# Patient Record
Sex: Male | Born: 1935 | Race: White | Hispanic: No | Marital: Married | Smoking: Former smoker
Health system: Southern US, Community
[De-identification: ages and names within clinical notes are randomized; demographics above are authoritative.]

## PROBLEM LIST (undated history)

## (undated) DIAGNOSIS — C679 Malignant neoplasm of bladder, unspecified: Secondary | ICD-10-CM

## (undated) DIAGNOSIS — M549 Dorsalgia, unspecified: Secondary | ICD-10-CM

## (undated) DIAGNOSIS — IMO0001 Reserved for inherently not codable concepts without codable children: Secondary | ICD-10-CM

## (undated) DIAGNOSIS — G8929 Other chronic pain: Secondary | ICD-10-CM

## (undated) DIAGNOSIS — R351 Nocturia: Secondary | ICD-10-CM

## (undated) DIAGNOSIS — J439 Emphysema, unspecified: Secondary | ICD-10-CM

## (undated) DIAGNOSIS — C9110 Chronic lymphocytic leukemia of B-cell type not having achieved remission: Secondary | ICD-10-CM

## (undated) DIAGNOSIS — L299 Pruritus, unspecified: Secondary | ICD-10-CM

## (undated) DIAGNOSIS — I2510 Atherosclerotic heart disease of native coronary artery without angina pectoris: Secondary | ICD-10-CM

## (undated) DIAGNOSIS — J45909 Unspecified asthma, uncomplicated: Secondary | ICD-10-CM

## (undated) DIAGNOSIS — Z8673 Personal history of transient ischemic attack (TIA), and cerebral infarction without residual deficits: Secondary | ICD-10-CM

## (undated) DIAGNOSIS — Z888 Allergy status to other drugs, medicaments and biological substances status: Secondary | ICD-10-CM

## (undated) DIAGNOSIS — I10 Essential (primary) hypertension: Secondary | ICD-10-CM

## (undated) DIAGNOSIS — R0600 Dyspnea, unspecified: Secondary | ICD-10-CM

## (undated) DIAGNOSIS — M1990 Unspecified osteoarthritis, unspecified site: Secondary | ICD-10-CM

## (undated) DIAGNOSIS — M8580 Other specified disorders of bone density and structure, unspecified site: Secondary | ICD-10-CM

## (undated) DIAGNOSIS — G454 Transient global amnesia: Secondary | ICD-10-CM

## (undated) DIAGNOSIS — N400 Enlarged prostate without lower urinary tract symptoms: Secondary | ICD-10-CM

## (undated) DIAGNOSIS — E785 Hyperlipidemia, unspecified: Secondary | ICD-10-CM

## (undated) DIAGNOSIS — R0609 Other forms of dyspnea: Secondary | ICD-10-CM

## (undated) HISTORY — PX: CATARACT EXTRACTION W/ INTRAOCULAR LENS  IMPLANT, BILATERAL: SHX1307

## (undated) HISTORY — DX: Hyperlipidemia, unspecified: E78.5

## (undated) HISTORY — DX: Unspecified osteoarthritis, unspecified site: M19.90

## (undated) HISTORY — DX: Essential (primary) hypertension: I10

## (undated) HISTORY — DX: Transient global amnesia: G45.4

## (undated) HISTORY — DX: Chronic lymphocytic leukemia of B-cell type not having achieved remission: C91.10

## (undated) HISTORY — DX: Other specified disorders of bone density and structure, unspecified site: M85.80

## (undated) HISTORY — DX: Malignant neoplasm of bladder, unspecified: C67.9

## (undated) HISTORY — DX: Benign prostatic hyperplasia without lower urinary tract symptoms: N40.0

---

## 1978-03-06 HISTORY — PX: EXCISIONAL HEMORRHOIDECTOMY: SHX1541

## 1978-11-05 DIAGNOSIS — Z8673 Personal history of transient ischemic attack (TIA), and cerebral infarction without residual deficits: Secondary | ICD-10-CM

## 1978-11-05 HISTORY — DX: Personal history of transient ischemic attack (TIA), and cerebral infarction without residual deficits: Z86.73

## 2000-07-06 ENCOUNTER — Encounter: Payer: Self-pay | Admitting: Family Medicine

## 2000-07-06 ENCOUNTER — Ambulatory Visit (HOSPITAL_COMMUNITY): Admission: RE | Admit: 2000-07-06 | Discharge: 2000-07-06 | Payer: Self-pay | Admitting: Family Medicine

## 2002-08-12 ENCOUNTER — Ambulatory Visit (HOSPITAL_COMMUNITY): Admission: RE | Admit: 2002-08-12 | Discharge: 2002-08-12 | Payer: Self-pay | Admitting: Gastroenterology

## 2002-08-12 ENCOUNTER — Encounter (INDEPENDENT_AMBULATORY_CARE_PROVIDER_SITE_OTHER): Payer: Self-pay | Admitting: Specialist

## 2004-03-06 DIAGNOSIS — M8580 Other specified disorders of bone density and structure, unspecified site: Secondary | ICD-10-CM

## 2004-03-06 HISTORY — DX: Other specified disorders of bone density and structure, unspecified site: M85.80

## 2005-07-04 HISTORY — PX: CARDIOVASCULAR STRESS TEST: SHX262

## 2008-10-19 ENCOUNTER — Ambulatory Visit (HOSPITAL_BASED_OUTPATIENT_CLINIC_OR_DEPARTMENT_OTHER): Admission: RE | Admit: 2008-10-19 | Discharge: 2008-10-19 | Payer: Self-pay | Admitting: Urology

## 2008-10-19 ENCOUNTER — Encounter (INDEPENDENT_AMBULATORY_CARE_PROVIDER_SITE_OTHER): Payer: Self-pay | Admitting: Urology

## 2008-10-19 HISTORY — PX: TRANSURETHRAL RESECTION OF BLADDER TUMOR: SHX2575

## 2010-06-11 LAB — POCT I-STAT, CHEM 8
BUN: 13 mg/dL (ref 6–23)
Calcium, Ion: 1.21 mmol/L (ref 1.12–1.32)
Chloride: 101 mEq/L (ref 96–112)
Creatinine, Ser: 1.1 mg/dL (ref 0.4–1.5)
Glucose, Bld: 104 mg/dL — ABNORMAL HIGH (ref 70–99)
HCT: 42 % (ref 39.0–52.0)
Hemoglobin: 14.3 g/dL (ref 13.0–17.0)
Potassium: 4.1 mEq/L (ref 3.5–5.1)
Sodium: 140 mEq/L (ref 135–145)
TCO2: 28 mmol/L (ref 0–100)

## 2010-07-19 NOTE — Op Note (Signed)
NAME:  Johnathan Strong, Johnathan Strong NO.:  [id number]   MEDICAL RECORD NO.:  [id number]          PATIENT TYPE:  AMB   LOCATION:  [facility]                         FACILITY:  [facility]   PHYSICIAN:  Mark C. Vernie Ammons, M.D.  DATE OF BIRTH:  [birth year]   DATE OF PROCEDURE:  10/19/2008  DATE OF DISCHARGE:                               OPERATIVE REPORT   PREOPERATIVE DIAGNOSES:  1. Bladder tumor.  2. Urethral stricture.   POSTOPERATIVE DIAGNOSES:  1. Bladder tumor.  2. Urethral stricture.   PROCEDURES:  1. Transurethral resection of bladder tumor (1.5 cm).  2. Dilation of urethral stricture.   SURGEON:  Mark C. Vernie Ammons, M.D.   ANESTHESIA:  General.   SPECIMENS:  Bladder tumor to pathology.   BLOOD LOSS:  Minimal.   DRAINS:  18-French Foley catheter.   COMPLICATIONS:  None.   INDICATIONS:  The patient is an 77 year old male who was seen for  hematuria.  Upper tract evaluation was negative.  However,  cystoscopically I found a bladder tumor on the right wall of his  bladder.  It appeared papillary in configuration.  I discussed with the  patient the need for its resectional biopsy and treatment and went over  the procedure in detail as well as the risks and complications and  alternatives.  The patient understands and has elected to proceed with  surgery.   DESCRIPTION OF OPERATION:  After informed consent, the patient was  brought to the major OR, placed on the table, administered general  anesthesia, then moved to the dorsal lithotomy position.  His genitalia  was sterilely prepped and draped and an official time-out was then  performed.   Initially, I passed the 22-French cystoscope and noted a slight  narrowing just inside the fossa navicularis and then also in an area in  the distal penile urethra.  Beyond that the urethra appeared entirely  normal.  The prostatic urethra revealed bilobar hypertrophy but no  lesions were identified.  Upon entering the bladder, I note  1+  trabeculation.  Ureteral orifices were noted to be of normal  configuration and position.  The bladder was then fully and  systematically inspected and the tumor was identified on the right wall.  It was near the right ureteral orifice but still well away from that  primarily lateral to the orifice.  There was some mild area of erythema  surrounding the papillary lesion.  It was photographed.   The cystoscope was removed and I then dilated the urethra with Sissy Hoff  sounds starting at 22-French and progressing to 30-French.  As I did so  I noted the area of the mildly strictured region did yield to gentle  pressure.   The 26-French resectoscope sheath with Timberlake obturator was then  introduced and passed into the bladder.  The obturator was removed and  the 12-degree lens with resectoscope element was inserted.  The patient  was paralyzed briefly using intravenous paralytic agents due to the  location of the tumor on the right wall of the bladder and then I  resected the lesion completely from the bladder.  The portions of the  tumor were sent to pathology.  I then fulgurated the base where the  lesion was located as well as circumferentially around the area resected  with care being taken to remain well away from the right ureteral  orifice.  No further bleeding was noted.  I, therefore, used the Ellik  evacuator to remove all any further portions of the bladder tumor from  the bladder and the bladder was drained and the resectoscope was  removed.  I then inserted an 18-French Foley catheter in the bladder and  this was connected to closed system drainage.  The patient was awakened  and taken to recovery room in stable and satisfactory condition.  He  tolerated the procedure well with no intraoperative complications.   I have given him a prescription for Vicodin #28, Cipro 250 mg b.i.d.  #20, and Pyridium 200 mg #30.  He will follow up in my office in 1 week  and will be  instructed on Foley catheter removal in 48 hours.      Mark C. Vernie Ammons, M.D.  Electronically Signed     MCO/MEDQ  D:  10/19/2008  T:  10/19/2008  Job:  [id number]

## 2010-07-22 NOTE — Op Note (Signed)
NAME:  Johnathan Strong, Johnathan Strong NO.:  [id number]   MEDICAL RECORD NO.:  [id number]                   PATIENT TYPE:  AMB   LOCATION:  ENDO                                 FACILITY:  [facility]   PHYSICIAN:  Petra Kuba, M.D.                 DATE OF BIRTH:  02-18-1924   DATE OF PROCEDURE:  08/12/2002  DATE OF DISCHARGE:                                 OPERATIVE REPORT   PROCEDURE:  Colonoscopy with polypectomy.   INDICATIONS FOR PROCEDURE:  Screening.  Consent was signed after risks,  benefits, methods, and options were thoroughly discussed in the office.   MEDICINES USED:  Demerol 90, Versed 8.5.   DESCRIPTION OF PROCEDURE:  Rectal inspection was pertinent for external  hemorrhoids, small. Digital exam was negative. The pediatric video  adjustable colonoscope was inserted and fairly easily advanced around the  colon to the cecum. This did require rolling him on his back and some  abdominal pressure. With insertion, some left sided diverticula was seen but  no other abnormalities. The cecum was identified by the appendiceal orifice  and the ileocecal valve. The scope was slowly withdrawn. The prep was  adequate. There was some liquid stool that required washing and suctioning.  On slow withdrawal through the colon in the mid ascending, a small polyp was  seen and was hot biopsied x2.  Also in the hepatic flexure another tiny  polyp was seen and was hot biopsied x1, both were put in the same container.  The scope was further withdrawn. Other than some left sided diverticula, no  other abnormalities were seen as we slowly withdrew back to the rectum. Once  back in the rectum, three tiny hyperplastic appearing polyps were seen and  were all cold biopsied and put in the second container. The scope was  retroflexed revealing some internal hemorrhoids. The scope was straightened  and readvanced a short ways up the left side of the colon, air was  suctioned, scope  removed. The patient tolerated the procedure well. There  was no obvious or immediate complications.   ENDOSCOPIC DIAGNOSIS:  1. Internal and external hemorrhoids.  2. Three hyperplastic appearing rectal polyps cold biopsied.  3. Two tiny to small hepatic flexure and ascending polyps hot biopsied.  4. Left sided diverticula.  5. Otherwise within normal limits to the cecum.   PLAN:  Await pathology to determine future colonic screening if doing well  medically, otherwise, okay to restart Persantine in three days. Happy to see  back p.r.n., return care to above mentioned doctor for the customary health  care maintenance to include year rectals and guaiacs.                                               Vernia Buff  E. Magod, M.D.    MEM/MEDQ  D:  08/12/2002  T:  08/12/2002  Job:  [id number]   cc:   Ernestina Penna, M.D.  [address] [place]  [place] [address]  Fax: [phone]   Cassell Clement, M.D.  [address]  [place]  [place] [address]  Fax: [phone]   Elta Guadeloupe, M.D.  Oncology, [facility]

## 2010-07-25 ENCOUNTER — Institutional Professional Consult (permissible substitution): Payer: Self-pay | Admitting: Internal Medicine

## 2010-08-03 ENCOUNTER — Other Ambulatory Visit: Payer: Self-pay | Admitting: Cardiology

## 2010-08-03 MED ORDER — HYDROCHLOROTHIAZIDE 12.5 MG PO CAPS: 12.5000 mg | ORAL_CAPSULE | Freq: Every day | ORAL | Status: DC

## 2010-08-03 NOTE — Telephone Encounter (Signed)
Med refill

## 2010-08-29 ENCOUNTER — Encounter: Payer: Self-pay | Admitting: Internal Medicine

## 2010-08-30 ENCOUNTER — Encounter: Payer: Self-pay | Admitting: Internal Medicine

## 2010-08-30 ENCOUNTER — Ambulatory Visit (INDEPENDENT_AMBULATORY_CARE_PROVIDER_SITE_OTHER): Payer: Medicare Other | Admitting: Internal Medicine

## 2010-08-30 VITALS — BP 130/80 | HR 57 | Ht 68.0 in | Wt 171.0 lb

## 2010-08-30 DIAGNOSIS — J449 Chronic obstructive pulmonary disease, unspecified: Secondary | ICD-10-CM | POA: Insufficient documentation

## 2010-08-30 MED ORDER — BUDESONIDE-FORMOTEROL FUMARATE 160-4.5 MCG/ACT IN AERO: INHALATION_SPRAY | RESPIRATORY_TRACT | Status: DC

## 2010-08-30 NOTE — Patient Instructions (Signed)
Stop advair  Start symbicort 160  Take 2 puffs first thing in am and then another 2 puffs about 12 hours later - this should improve your breathing and reduce your need for albuterol.  If not, we'll need to see you in [facility] for cxr and full pft's - call [phone] to arrange this    Work on perfecting inhaler technique:  relax and gently blow all the way out then take a nice smooth deep breath back in, triggering the inhaler at same time you start breathing in.  Hold for up to 5 seconds if you can.  Rinse and gargle with water when done   If your mouth or throat starts to bother you,   I suggest you time the inhaler to your dental care and after using the inhaler(s) brush teeth and tongue with a baking soda containing toothpaste and when you rinse this out, gargle with it first to see if this helps your mouth and throat.      If you are satisfied with your treatment plan let your doctor know and he/she can either refill your medications or you can return here when your prescription runs out.     If in any way you are not 100% satisfied,  please tell us.  If 100% better, tell your friends!

## 2010-08-30 NOTE — Assessment & Plan Note (Signed)
  When respiratory symptoms begin well after a patient reports complete smoking cessation,  it is very hard to "blame" COPD specifically or airways disorders in general  ie it doesn't make any more sense than hearing a  [employer] driver wrecked his car while driving his kids to school or a surgeon sliced his hand off carving roast beef (it must be rare indeed!)     That is to say, once the high risk activity stops,  the symptoms should not suddenly erupt or markedly worsen.  If so, the differential diagnosis should include  obesity/deconditioning,  LPR/Reflux/Aspiration syndromes,  occult CHF, or  especially side effect of medications commonly used in this population (especially Beta blockers and ace inhibitors)  The other possibility is that the advair is irritating his upper airway more than helping his lower  The proper method of use, as well as anticipated side effects, of this metered-dose inhaler are discussed and demonstrated to the patient. This improved to 50% with coaching so try symbicort 160 Take 2 puffs first thing in am and then another 2 puffs about 12 hours later.    Next step might be trial off ACE and coreg and on ARB and bisoprolol or bystolic depending on response to symbicort

## 2010-08-30 NOTE — Progress Notes (Signed)
  Subjective:    Patient ID: Johnathan Strong, male    DOB: 1923/05/18, 77 y.o.   MRN: [id number]  HPI  40 yowm quit smoking  In the 1960's then around 2000 dx with pneumonia and never completely recovered "breathing capacity" and slow deterioration since then so referred by Dr Christell Constant to pulmonary clinic in [place] in June 2012   08/30/2010 Initial pulmonary office eval in EMR era cc variable doe x 10 years seems worse high heat / humidity but even in cool weather has sense can't get a deep breath.  Sleeping ok without nocturnal  or early am exac of resp c/o's or need for noct saba.  On advair and spiriva each am plus freq use of albuterol esp before church "otherwise can't complete the verse".  No sign cough or excess mucus even in am's.  Doe x > slow walking.  Pt denies any significant sore throat, dysphagia, itching, sneezing,  nasal congestion or excess/ purulent secretions,  fever, chills, sweats, unintended wt loss, pleuritic or exertional cp, hempoptysis, orthopnea pnd or leg swelling.    Also denies any obvious fluctuation of symptoms with weather or environmental changes or other aggravating or alleviating factors.  Sleeping ok without nocturnal  or early am exac of resp c/o's or need for noct saba.    Review of Systems  Constitutional: Negative for fever and unexpected weight change.  HENT: Positive for congestion. Negative for ear pain, nosebleeds, sore throat, rhinorrhea, sneezing, trouble swallowing, dental problem, postnasal drip and sinus pressure.   Eyes: Negative for redness and itching.  Respiratory: Positive for shortness of breath. Negative for cough, chest tightness and wheezing.   Cardiovascular: Negative for palpitations and leg swelling.  Gastrointestinal: Negative for nausea and vomiting.  Genitourinary: Negative for dysuria.  Musculoskeletal: Positive for joint swelling.  Skin: Negative for rash.  Neurological: Negative for headaches.  Hematological: Does not bruise/bleed  easily.  Psychiatric/Behavioral: Negative for dysphoric mood. The patient is not nervous/anxious.        Objective:   Physical Exam   pleasant relatively thin amb wm nad minimal hoarse/ pseudowheeze HEENT mild turbinate edema.  Oropharynx no thrush or excess pnd or cobblestoning.  No JVD or cervical adenopathy. Mild accessory muscle hypertrophy. Trachea midline, nl thryroid. Chest was hyperinflated by percussion with diminished breath sounds and moderate increased exp time without wheeze. Hoover sign positive at mid inspiration. Regular rate and rhythm without murmur gallop or rub or increase P2 or edema.  Abd: no hsm, nl excursion. Ext warm without cyanosis or clubbing.     Assessment & Plan:

## 2010-09-06 ENCOUNTER — Telehealth: Payer: Self-pay | Admitting: Internal Medicine

## 2010-09-06 NOTE — Telephone Encounter (Signed)
Called, spoke with pt.  States he is "100% better" on symbicort.  States his exercise tolerance has greatly increased.  He walked 1 mile this am and only had to stop twice.  He would like to thank MW for this.  Advised would send message to him as FYI.  Pt verbalized understanding of this and voiced no further concerns at this time.

## 2010-12-01 ENCOUNTER — Encounter: Payer: Self-pay | Admitting: Cardiology

## 2010-12-15 ENCOUNTER — Ambulatory Visit (INDEPENDENT_AMBULATORY_CARE_PROVIDER_SITE_OTHER): Payer: Medicare Other | Admitting: Cardiology

## 2010-12-15 ENCOUNTER — Encounter: Payer: Self-pay | Admitting: Cardiology

## 2010-12-15 VITALS — BP 148/64 | HR 65 | Ht 68.0 in | Wt 170.0 lb

## 2010-12-15 DIAGNOSIS — R0989 Other specified symptoms and signs involving the circulatory and respiratory systems: Secondary | ICD-10-CM

## 2010-12-15 DIAGNOSIS — IMO0001 Reserved for inherently not codable concepts without codable children: Secondary | ICD-10-CM | POA: Insufficient documentation

## 2010-12-15 DIAGNOSIS — C9110 Chronic lymphocytic leukemia of B-cell type not having achieved remission: Secondary | ICD-10-CM

## 2010-12-15 DIAGNOSIS — I119 Hypertensive heart disease without heart failure: Secondary | ICD-10-CM

## 2010-12-15 DIAGNOSIS — E785 Hyperlipidemia, unspecified: Secondary | ICD-10-CM

## 2010-12-15 MED ORDER — LOSARTAN POTASSIUM 50 MG PO TABS: 50.0000 mg | ORAL_TABLET | Freq: Every day | ORAL | Status: DC

## 2010-12-15 NOTE — Assessment & Plan Note (Signed)
Patient is on Lipitor for his dyslipidemia.  This was followed by Dr.Moore.  Patient is not having a myalgias or side effects from the Lipitor.

## 2010-12-15 NOTE — Progress Notes (Signed)
Johnathan Strong Date of Birth:  1923/07/16 [facility] / [facility] [address]   [address] [place], [place]  [address] [phone]           Fax   [phone]  History of Present Illness: This pleasant 77 year old gentleman is seen after a long absence.  We last saw him in April of 2011.  He has a past history of COPD with exertional dyspnea.  He also has a history of essential hypertension.  He does not have any history of known coronary artery disease.  He had a nuclear stress test in May 2007, which showed no reversible ischemia and he had a normal ejection fraction.  Recently, he had a stress Cardiolite on 06/24/09, which showed no ischemia and showed an ejection fraction of 73%.  He has inferolateral T wave inversion at rest, which become upright during recovery.  Recently, he was seen by Dr. Sherene Sires regarding his dyspnea.  Dr. Elesa Massed, switched him to Symbicort, which has helped.  Current Outpatient Prescriptions  Medication Sig Dispense Refill  . albuterol (VENTOLIN HFA) 108 (90 BASE) MCG/ACT inhaler Inhale 2 puffs into the lungs every 6 (six) hours as needed.        . ANDROGEL PUMP 1.25 GM/ACT (1%) GEL Place 4 application onto the skin Daily.      . budesonide-formoterol (SYMBICORT) 160-4.5 MCG/ACT inhaler Take 2 puffs first thing in am and then another 2 puffs about 12 hours later.     1 Inhaler  12  . Cholecalciferol (VITAMIN D3) 1000 UNITS CAPS Take 1 capsule by mouth daily.        . diphenhydramine-acetaminophen (TYLENOL PM) 25-500 MG TABS Take 1 tablet by mouth at bedtime as needed.        . dipyridamole (PERSANTINE) 50 MG tablet Take 1 tablet by mouth Twice daily.      . fluticasone (FLONASE) 50 MCG/ACT nasal spray Place 2 sprays into the nose Daily.      . furosemide (LASIX) 20 MG tablet Take 20 mg by mouth daily as needed.        . hydrochlorothiazide (,MICROZIDE/HYDRODIURIL,) 12.5 MG capsule Take 1 capsule (12.5 mg total) by mouth daily.  90 capsule  3  . LIPITOR  10 MG tablet Take 1 tablet by mouth Daily.      . montelukast (SINGULAIR) 10 MG tablet Take 10 mg by mouth at bedtime.        . NORVASC 5 MG tablet Take 1 tablet by mouth Daily.      . Omega-3 Fatty Acids (FISH OIL) 1200 MG CAPS Take 1 capsule by mouth daily.        [name] tiotropium (SPIRIVA) 18 MCG inhalation capsule Place 18 mcg into inhaler and inhale daily.        . traMADol (ULTRAM) 50 MG tablet Take 50 mg by mouth 2 (two) times daily.        . carvedilol (COREG) 12.5 MG tablet Take 1 tablet by mouth Twice daily.      [name] losartan (COZAAR) 50 MG tablet Take 1 tablet (50 mg total) by mouth daily.  90 tablet  1    Allergies  Allergen Reactions  . Aspirin     edema  . Benicar (Olmesartan Medoxomil)     dizzy  . Nsaids     sob  . Salicylates     SOB    Patient Active Problem List  Diagnoses  . COPD (chronic obstructive pulmonary disease)  . Benign  hypertensive heart disease without heart failure  . Dyslipidemia    History  Smoking status  . Former Smoker -- 1.0 packs/day for 25 years  . Types: Cigarettes  . Quit date: 03/06/1958  Smokeless tobacco  . Former Neurosurgeon  Comment: Quite in 1980    History  Alcohol Use No    Family History  Problem Relation Age of Onset  . Emphysema Father   . Heart disease Father   . COPD Father   . Atrial fibrillation Father   . Kidney cancer Mother   . Kidney failure Mother     Review of Systems: Constitutional: no fever chills diaphoresis or fatigue or change in weight.  Head and neck: no hearing loss, no epistaxis, no photophobia or visual disturbance. Respiratory: No cough, shortness of breath or wheezing. Cardiovascular: No chest pain peripheral edema, palpitations. Gastrointestinal: No abdominal distention, no abdominal pain, no change in bowel habits hematochezia or melena. Genitourinary: No dysuria, no frequency, no urgency, no nocturia. Musculoskeletal:No arthralgias, no back pain, no gait disturbance or  myalgias. Neurological: No dizziness, no headaches, no numbness, no seizures, no syncope, no weakness, no tremors. Hematologic: No lymphadenopathy, no easy bruising. Psychiatric: No confusion, no hallucinations, no sleep disturbance.    Physical Exam: Filed Vitals:   12/15/10 1414  BP: 148/64  Pulse: 65   the general appearance reveals a healthy-appearing, elderly gentleman in no distress. Pupils equal and reactive.   Extraocular Movements are full.  There is no scleral icterus.  The mouth and pharynx are normal.  The neck is supple.  The carotids reveal no bruits.  The jugular venous pressure is normal.  The thyroid is not enlarged.  There is no lymphadenopathy.  The chest is clear to percussion and auscultation. There are no rales or rhonchi. Expansion of the chest is symmetrical.  The precordium is quiet.  The first heart sound is normal.  The second heart sound is physiologically split.  There is no murmur gallop rub or click.  There is no abnormal lift or heave.  The abdomen is soft and nontender. Bowel sounds are normal. The liver and spleen are not enlarged. There Are no abdominal masses. There are no bruits.  The pedal pulses are good.  There is no phlebitis or edema.  There is no cyanosis or clubbing. Strength is normal and symmetrical in all extremities.  There is no lateralizing weakness.  There are no sensory deficits.  EKG today interpreted by me shows normal sinus rhythm with first degree AV block and lateral T-wave inversion.  The tracing is unchanged since 04/19/07   Assessment / Plan:  The patient is to continue same medication and we will add losartan 50 mg one daily.  Patient is to return soon for a two-dimensional echocardiogram.  He is to return in 2-3 weeks for an office visit with Lawson Fiscal and get a follow up basal metabolic panel at that time

## 2010-12-15 NOTE — Assessment & Plan Note (Signed)
The patient has had a history of CLL for about 20 years.  His most recent white count is still only slightly elevated at 16,000.  He is followed at [facility] oncology and hematology for this

## 2010-12-15 NOTE — Patient Instructions (Signed)
Add Losartan 50 mg daily, sent to [facility]  Appointment with Lawson Fiscal, NP in 2-3 weeks after Echo  Your physician has requested that you have an echocardiogram. Echocardiography is a painless test that uses sound waves to create images of your heart. It provides your doctor with information about the size and shape of your heart and how well your heart's chambers and valves are working. This procedure takes approximately one hour. There are no restrictions for this procedure.

## 2010-12-15 NOTE — Assessment & Plan Note (Signed)
The patient's blood pressure continues to be labile.  Today, his pressure is high at 160/80.  Patient is not on any ACE or an ARB.  Because of his dyspnea.  We will avoid ACE inhibitors and start him on low-dose losartan 50 mg one daily.

## 2010-12-26 ENCOUNTER — Encounter: Payer: Self-pay | Admitting: *Deleted

## 2010-12-27 ENCOUNTER — Other Ambulatory Visit: Payer: Medicare Other | Admitting: *Deleted

## 2010-12-27 ENCOUNTER — Other Ambulatory Visit (HOSPITAL_COMMUNITY): Payer: Medicare Other | Admitting: Radiology

## 2010-12-27 ENCOUNTER — Ambulatory Visit: Payer: Medicare Other | Admitting: Nurse Practitioner

## 2011-01-03 ENCOUNTER — Other Ambulatory Visit: Payer: Medicare Other | Admitting: *Deleted

## 2011-01-03 ENCOUNTER — Ambulatory Visit (INDEPENDENT_AMBULATORY_CARE_PROVIDER_SITE_OTHER): Payer: Medicare Other | Admitting: Nurse Practitioner

## 2011-01-03 ENCOUNTER — Ambulatory Visit (HOSPITAL_COMMUNITY): Payer: Medicare Other | Attending: Cardiology | Admitting: Radiology

## 2011-01-03 ENCOUNTER — Encounter: Payer: Self-pay | Admitting: Nurse Practitioner

## 2011-01-03 VITALS — BP 140/78 | HR 60 | Ht 68.0 in | Wt 173.4 lb

## 2011-01-03 DIAGNOSIS — R0609 Other forms of dyspnea: Secondary | ICD-10-CM

## 2011-01-03 DIAGNOSIS — J4489 Other specified chronic obstructive pulmonary disease: Secondary | ICD-10-CM

## 2011-01-03 DIAGNOSIS — I10 Essential (primary) hypertension: Secondary | ICD-10-CM

## 2011-01-03 DIAGNOSIS — R0989 Other specified symptoms and signs involving the circulatory and respiratory systems: Secondary | ICD-10-CM | POA: Insufficient documentation

## 2011-01-03 DIAGNOSIS — I119 Hypertensive heart disease without heart failure: Secondary | ICD-10-CM

## 2011-01-03 DIAGNOSIS — J449 Chronic obstructive pulmonary disease, unspecified: Secondary | ICD-10-CM

## 2011-01-03 DIAGNOSIS — C9590 Leukemia, unspecified not having achieved remission: Secondary | ICD-10-CM | POA: Insufficient documentation

## 2011-01-03 DIAGNOSIS — E785 Hyperlipidemia, unspecified: Secondary | ICD-10-CM | POA: Insufficient documentation

## 2011-01-03 HISTORY — PX: TRANSTHORACIC ECHOCARDIOGRAM: SHX275

## 2011-01-03 NOTE — Assessment & Plan Note (Signed)
Has chronic shortness of breath, better with Symbicort. Echo is pending.

## 2011-01-03 NOTE — Patient Instructions (Addendum)
Continue with your current medicines. Monitor your blood pressure at home.  Record your readings and bring to your next visit. Limit sodium intake. Call for any problems.  We will call you in a day or so to let you know about the ultrasound.   We will see you back in 4 months.

## 2011-01-03 NOTE — Assessment & Plan Note (Addendum)
Blood pressure has improved. Reported readings from home are satisfactory. He will continue to monitor. He has had his BMET last week at [facility] and will mail a copy to Dr. Patty Sermons for [place] to review. We will see him back in 4 months. Echo results are pending. Patient is agreeable to this plan and will call if any problems develop in the interim.    Letter received in the mail on November 12th, 2012. He sends in a list of readings that are satisfactory along with a copy of his recent labs.

## 2011-01-03 NOTE — Progress Notes (Signed)
[name] Date of Birth: [birth year] Medical Record #[id number]  History of Present Illness: Mr. Bautch is seen today for a follow up visit. It is a 2 week check. He is seen for Dr. Patty Sermons. He has been started on Losartan 50 mg for his blood pressure. He is doing well with his current medicines. He did have to switch to taking it at night due to some postural hypotension. This has now resolved. Blood pressure is staying below 140 for the most part. He did not bring his diary in for review. He has also been back to [facility] for his CLL last week and had his labs done there. He does not want to do those today. He has had his echo earlier today. Those results are pending.   Current Outpatient Prescriptions on File Prior to Visit  Medication Sig Dispense Refill  . albuterol (VENTOLIN HFA) 108 (90 BASE) MCG/ACT inhaler Inhale 2 puffs into the lungs every 6 (six) hours as needed.        . ANDROGEL PUMP 1.25 GM/ACT (1%) GEL Place 4 application onto the skin Daily.      . budesonide-formoterol (SYMBICORT) 160-4.5 MCG/ACT inhaler Take 2 puffs first thing in am and then another 2 puffs about 12 hours later.     1 Inhaler  12  . carvedilol (COREG) 12.5 MG tablet Take 1 tablet by mouth Twice daily.      . Cholecalciferol (VITAMIN D3) 1000 UNITS CAPS Take 1 capsule by mouth daily.        . diphenhydramine-acetaminophen (TYLENOL PM) 25-500 MG TABS Take 1 tablet by mouth at bedtime as needed.        . dipyridamole (PERSANTINE) 50 MG tablet Take 1 tablet by mouth Twice daily.      . fluticasone (FLONASE) 50 MCG/ACT nasal spray Place 2 sprays into the nose as needed.       . furosemide (LASIX) 20 MG tablet Take 20 mg by mouth daily as needed.        . hydrochlorothiazide (,MICROZIDE/HYDRODIURIL,) 12.5 MG capsule Take 1 capsule (12.5 mg total) by mouth daily.  90 capsule  3  . LIPITOR 10 MG tablet Take 1 tablet by mouth Daily.      [name] losartan (COZAAR) 50 MG tablet Take 1 tablet (50 mg total) by mouth  daily.  90 tablet  1  . montelukast (SINGULAIR) 10 MG tablet Take 10 mg by mouth at bedtime.        . NORVASC 5 MG tablet Take 1 tablet by mouth Daily.      . Omega-3 Fatty Acids (FISH OIL) 1200 MG CAPS Take 1 capsule by mouth daily.        [name] tiotropium (SPIRIVA) 18 MCG inhalation capsule Place 18 mcg into inhaler and inhale daily.        . traMADol (ULTRAM) 50 MG tablet Take 50 mg by mouth 2 (two) times daily.          Allergies  Allergen Reactions  . Aspirin     edema  . Benicar (Olmesartan Medoxomil)     dizzy  . Nsaids     sob  . Salicylates     SOB    Past Medical History  Diagnosis Date  . Osteopenia 2006  . Hypertension   . Hyperlipidemia   . TIA (transient ischemic attack) 1980s  . BPH (benign prostatic hypertrophy)   . CLL (chronic lymphocytic leukemia)   . Transient global amnesia   .  Osteoarthritis   . Bladder cancer   . COPD (chronic obstructive pulmonary disease)   . Dyspnea     Past Surgical History  Procedure Date  . Bladder surgery 2010  . Hemorroidectomy 1980  . Cardiovascular stress test 06/24/2009    EF 73%    History  Smoking status  . Former Smoker -- 1.0 packs/day for 25 years  . Types: Cigarettes  . Quit date: 03/06/1958  Smokeless tobacco  . Former Neurosurgeon  Comment: Quite in 1980    History  Alcohol Use No    Family History  Problem Relation Age of Onset  . Emphysema Father   . Heart disease Father   . COPD Father   . Atrial fibrillation Father   . Kidney cancer Mother   . Kidney failure Mother     Review of Systems: The review of systems is positive for some arthritis. He has chronic shortness of breath that has improved with Symbicort.  All other systems were reviewed and are negative.  Physical Exam: Ht 5\' 8"  (1.727 m)  Wt 173 lb 6.4 oz (78.654 kg)  BMI 26.37 kg/m2 Patient is very pleasant and in no acute distress. Skin is warm and dry. Color is normal.  HEENT is unremarkable. Normocephalic/atraumatic. PERRL. Sclera are  nonicteric. Neck is supple. No masses. No JVD. Lungs are clear. Cardiac exam shows a regular rate and rhythm. Abdomen is soft. Extremities are without edema. Gait and ROM are intact. No gross neurologic deficits noted.   LABORATORY DATA:   Assessment / Plan:

## 2011-01-04 ENCOUNTER — Telehealth: Payer: Self-pay | Admitting: *Deleted

## 2011-01-04 NOTE — Progress Notes (Signed)
Advised patient

## 2011-01-04 NOTE — Telephone Encounter (Signed)
Message copied by Burnell Blanks on Wed Jan 04, 2011  1:18 PM ------      Message from: Cassell Clement      Created: Tue Jan 03, 2011  5:30 PM       Please report.  The echocardiogram shows good left ventricular systolic function.  There is mild diastolic dysfunction, which may account for his dyspnea.  Continue present medication.  Copy of the echocardiogram to Dr. Rudi Heap.

## 2011-01-04 NOTE — Telephone Encounter (Signed)
Advised of results.  Will send to Dr Christell Constant and sent patient a copy as requested.

## 2011-01-05 NOTE — Progress Notes (Signed)
Addended by: Hector Brunswick E on: 01/05/2011 04:45 PM   Modules accepted: Orders

## 2011-01-09 ENCOUNTER — Encounter: Payer: Self-pay | Admitting: Cardiology

## 2011-03-06 ENCOUNTER — Telehealth: Payer: Self-pay | Admitting: *Deleted

## 2011-03-06 NOTE — Telephone Encounter (Signed)
Received letter in mail with his BP readings. Letter was addressed to Dr. Swaz[place] but is actually Dr. Yevonne Pax pt and was seen by Lawson Fiscal in October. Tried multiple times to get back in touch with him but phone number is out of service. BP readings range from 119/64 on 12/8 and 146/71 on 12/15. Will forward these to Dr. Patty Sermons. Dr. Swaziland states all readings were good.

## 2011-05-17 ENCOUNTER — Other Ambulatory Visit: Payer: Self-pay | Admitting: *Deleted

## 2011-05-17 DIAGNOSIS — I119 Hypertensive heart disease without heart failure: Secondary | ICD-10-CM

## 2011-05-17 MED ORDER — LOSARTAN POTASSIUM 50 MG PO TABS: 50.0000 mg | ORAL_TABLET | Freq: Every day | ORAL | Status: DC

## 2011-05-17 NOTE — Telephone Encounter (Signed)
Refilled losartan 

## 2011-05-29 ENCOUNTER — Ambulatory Visit: Payer: Medicare Other | Admitting: Cardiology

## 2011-07-12 ENCOUNTER — Encounter: Payer: Self-pay | Admitting: Cardiology

## 2011-08-02 ENCOUNTER — Other Ambulatory Visit: Payer: Self-pay | Admitting: Dermatology

## 2011-09-19 ENCOUNTER — Other Ambulatory Visit: Payer: Self-pay | Admitting: Internal Medicine

## 2011-10-18 ENCOUNTER — Other Ambulatory Visit: Payer: Self-pay | Admitting: Urology

## 2011-10-24 ENCOUNTER — Encounter (HOSPITAL_BASED_OUTPATIENT_CLINIC_OR_DEPARTMENT_OTHER): Payer: Self-pay | Admitting: *Deleted

## 2011-10-25 ENCOUNTER — Encounter (HOSPITAL_BASED_OUTPATIENT_CLINIC_OR_DEPARTMENT_OTHER): Payer: Self-pay | Admitting: *Deleted

## 2011-10-25 NOTE — Progress Notes (Addendum)
NPO AFTER MN. ARRIVES AT 0845. NEEDS ISTAT. CURRENT CXR AND EKG W/ CHART. WILL TAKE COREG, TRAMADOL, AND DO SPIRIVA, SYMBICORT INHALER AM OF SURG W/ SIPS OF WATER.  LAST CBC DONE AT [facility] AND FAXED FROM DR Vernie Ammons OFFICE W/ CHART. PLT COUNT NORMAL 168.

## 2011-10-29 NOTE — H&P (Signed)
History of Present Illness             Bladder cancer: He was fully worked up for microscopic hematuria in 2008 with no abnormality being found. He subsequently was found to have normal upper tracts by CT scan and a urine cytology that was atypical and followed with a FISH that was positive for urothelial malignancy.  A second FISH was negative. 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       Hypogonadism:  He was initially started on testosterone replacement therapy and his primary symptom was erectile dysfunction. He has subsequently ceased to be sexually active and we therefore stopped his testosterone replacement because he was developing some breast tenderness.       He developed some urinary frequency after the procedure and continues to work for [employer] he gets on his medication for free he was taking some Detrol LA 2 mg dose. I subsequently switched him to the Toviaz 8 mg.       History of urethral stricture disease: He has some mild, filamentous strictures in the pendulous urethra but none down in the bulbar urethra. He's not having any new obstructive voiding symptoms.  Interval history: He is doing well with no new voiding complaints or hematuria.   Past Medical History Problems  1. History of  Chronic Lymphocytic Leukemia V10.61 2. History of  Heart Disease 429.9 3. History of  Hypercholesterolemia 272.0 4. History of  Hypertension 401.9 5. History of  Ischemic Stroke 434.90 6. History of  Microscopic Hematuria 599.72 7. History of  Nephrolithiasis V13.01 8. History of  Transitional Cell Carcinoma Of The Bladder V10.51 9. History of  Urethral Stricture 598.9 10. History of  Urinary Calculus 592.9  Surgical History Problems  1. History of   Cystoscopy With Fulguration Small Lesion (5-57mm) 2. History of  Hemorrhoidectomy  Current Meds 1. AndroGel Pump 1.25 GM/ACT (1%) Transdermal Gel; APPLY FOUR PUMPS TOPICALLY IN THE  MORNING; Therapy: 17Nov2011 to (Evaluate:24Jun2013)  Requested for: 26Dec2012; Last  Rx:26Dec2012 2. Astelin 137 MCG/SPRAY Nasal Solution; Therapy: (Recorded:25Jun2008) to 3. Benazepril HCl 20 MG Oral Tablet; Therapy: (Recorded:25Jun2008) to 4. Caduet 5-10 MG Oral Tablet; Therapy: (Recorded:25Jun2008) to 5. Carvedilol 12.5 MG Oral Tablet; Therapy: 19Mar2012 to 6. Clobetasol Propionate 0.05 % External Cream; Therapy: (Recorded:25Jun2008) to 7. Colace CAPS; Therapy: (Recorded:25Jun2008) to 8. Dipyridamole 50 MG Oral Tablet; Therapy: (Recorded:25Jun2008) to 9. Fish Oil Concentrate 1000 MG Oral Capsule; Therapy: (Recorded:07Feb2013) to 10. Fluticasone Propionate 50 MCG/ACT Nasal Suspension; Therapy: 09Jan2012 to 11. Furosemide 20 MG Oral Tablet; Therapy: 21Dec2011 to 12. Hydrochlorothiazide 12.5 MG Oral Capsule; Therapy: 05Mar2012 to 13. Lipitor 10 MG Oral Tablet; Therapy: 21Dec2011 to 14. Losartan Potassium 50 MG Oral Tablet; Therapy: 13Dec2012 to 15. Norvasc 5 MG Oral Tablet; Therapy: 08Nov2011 to 16. Singulair 10 MG Oral Tablet; Therapy: (Recorded:25Jun2008) to 17. Spiriva HandiHaler CAPS; Therapy: (Recorded:25Jun2008) to 18. Symbicort 160-4.5 MCG/ACT Inhalation Aerosol; Therapy: 26Jun2012 to 19. TraMADol HCl 50 MG Oral Tablet; Therapy: (Recorded:25Jun2008) to 20. Viagra 100 MG Oral Tablet; Therapy: 06Feb2013 to  Allergies Medication  1. Salicylates 2. Aspirin TABS 3. NSAIDs  Family History Problems  1. Family history of  Family Health Status Number Of Children 1 son; 1 daughter  Social History Problems  1. Alcohol Use 1 daily 2. Caffeine Use daily 3. Paternal history of  Death In The Family Father age 54; congestive heart failure 4. Maternal history of  Death In The Family Mother age 54; ?  kidney cancer 5. Marital History - Currently Married 6. Occupation: retired 7. Tobacco Use V15.82  Review of Systems Genitourinary, constitutional, skin, eye, otolaryngeal, hematologic/lymphatic, cardiovascular, pulmonary, endocrine, musculoskeletal, gastrointestinal, neurological and psychiatric system(s) were reviewed and pertinent findings if present are noted.  Genitourinary: urinary frequency, feelings of urinary urgency, dysuria, nocturia and suprapubic pain.    Vitals  BMI Calculated: 26.07 BSA Calculated: 1.92 Height: 5 ft 8 in Weight: 172 lb  Blood Pressure: 124 / 61 Heart Rate: 56  Physical Exam Constitutional: Well nourished and well developed . No acute distress.  ENT:. The ears and nose are normal in appearance.  Neck: The appearance of the neck is normal and no neck mass is present.  Pulmonary: No respiratory distress and normal respiratory rhythm and effort.  Cardiovascular:. No peripheral edema.  Skin: Normal skin turgor, no visible rash and no visible skin lesions.  Neuro/Psych:. Mood and affect are appropriate.   Assessment Assessed  1. History of  Transitional Cell Carcinoma Of The Bladder V10.51   He had several small, low grade appearing papillary tumors seen on the posterior wall of the bladder on the right-hand side. We discussed fulgurating these things due to that his past history of low-grade, superficial disease. I told him I would obtain a cold cup biopsy for pathologic evaluation but likely just fulgurate the lesions. He will be done under anesthesia as an outpatient. Discussed the procedure with him in detail including its risks and complications and the probability of success. He understands and elected to proceed.   Plan    He will be scheduled for outpatient treatment of his bladder tumors.

## 2011-10-30 ENCOUNTER — Ambulatory Visit (HOSPITAL_BASED_OUTPATIENT_CLINIC_OR_DEPARTMENT_OTHER): Payer: Medicare Other | Admitting: Anesthesiology

## 2011-10-30 ENCOUNTER — Ambulatory Visit (HOSPITAL_BASED_OUTPATIENT_CLINIC_OR_DEPARTMENT_OTHER)
Admission: RE | Admit: 2011-10-30 | Discharge: 2011-10-30 | Disposition: A | Discharge status: Home / Self Care | Payer: Medicare Other | Source: Ambulatory Visit | Attending: Urology | Admitting: Urology

## 2011-10-30 ENCOUNTER — Encounter (HOSPITAL_BASED_OUTPATIENT_CLINIC_OR_DEPARTMENT_OTHER)
Admission: RE | Disposition: A | Discharge status: Home / Self Care | Payer: Self-pay | Source: Ambulatory Visit | Attending: Urology

## 2011-10-30 ENCOUNTER — Encounter (HOSPITAL_BASED_OUTPATIENT_CLINIC_OR_DEPARTMENT_OTHER): Payer: Self-pay | Admitting: Anesthesiology

## 2011-10-30 ENCOUNTER — Encounter (HOSPITAL_BASED_OUTPATIENT_CLINIC_OR_DEPARTMENT_OTHER): Payer: Self-pay | Admitting: *Deleted

## 2011-10-30 DIAGNOSIS — I10 Essential (primary) hypertension: Secondary | ICD-10-CM | POA: Insufficient documentation

## 2011-10-30 DIAGNOSIS — Z856 Personal history of leukemia: Secondary | ICD-10-CM | POA: Insufficient documentation

## 2011-10-30 DIAGNOSIS — C674 Malignant neoplasm of posterior wall of bladder: Secondary | ICD-10-CM | POA: Insufficient documentation

## 2011-10-30 DIAGNOSIS — C679 Malignant neoplasm of bladder, unspecified: Secondary | ICD-10-CM

## 2011-10-30 DIAGNOSIS — Z79899 Other long term (current) drug therapy: Secondary | ICD-10-CM | POA: Insufficient documentation

## 2011-10-30 DIAGNOSIS — Z8673 Personal history of transient ischemic attack (TIA), and cerebral infarction without residual deficits: Secondary | ICD-10-CM | POA: Insufficient documentation

## 2011-10-30 DIAGNOSIS — E7800 Pure hypercholesterolemia, unspecified: Secondary | ICD-10-CM | POA: Insufficient documentation

## 2011-10-30 HISTORY — DX: Other forms of dyspnea: R06.09

## 2011-10-30 HISTORY — DX: Other chronic pain: G89.29

## 2011-10-30 HISTORY — DX: Dyspnea, unspecified: R06.00

## 2011-10-30 HISTORY — DX: Pruritus, unspecified: L29.9

## 2011-10-30 HISTORY — DX: Nocturia: R35.1

## 2011-10-30 HISTORY — DX: Emphysema, unspecified: J43.9

## 2011-10-30 HISTORY — PX: CYSTOSCOPY WITH BIOPSY: SHX5122

## 2011-10-30 HISTORY — DX: Personal history of transient ischemic attack (TIA), and cerebral infarction without residual deficits: Z86.73

## 2011-10-30 HISTORY — DX: Dorsalgia, unspecified: M54.9

## 2011-10-30 HISTORY — DX: Reserved for inherently not codable concepts without codable children: IMO0001

## 2011-10-30 LAB — POCT I-STAT 4, (NA,K, GLUC, HGB,HCT)
Glucose, Bld: 92 mg/dL (ref 70–99)
HCT: 40 % (ref 39.0–52.0)
Hemoglobin: 13.6 g/dL (ref 13.0–17.0)
Potassium: 4 mEq/L (ref 3.5–5.1)
Sodium: 143 mEq/L (ref 135–145)

## 2011-10-30 SURGERY — CYSTOSCOPY, WITH BIOPSY
Anesthesia: General | Site: Bladder | Wound class: Clean Contaminated

## 2011-10-30 MED ORDER — PROMETHAZINE HCL 25 MG/ML IJ SOLN: 6.2500 mg | INTRAMUSCULAR | Status: DC | PRN

## 2011-10-30 MED ORDER — PHENAZOPYRIDINE HCL 200 MG PO TABS: 200.0000 mg | ORAL_TABLET | Freq: Three times a day (TID) | ORAL | Status: AC | PRN

## 2011-10-30 MED ORDER — PROPOFOL 10 MG/ML IV EMUL
INTRAVENOUS | Status: DC | PRN
  Administered 2011-10-30: 150 mg via INTRAVENOUS

## 2011-10-30 MED ORDER — ONDANSETRON HCL 4 MG/2ML IJ SOLN
INTRAMUSCULAR | Status: DC | PRN
  Administered 2011-10-30: 4 mg via INTRAVENOUS

## 2011-10-30 MED ORDER — LIDOCAINE HCL (CARDIAC) 20 MG/ML IV SOLN
INTRAVENOUS | Status: DC | PRN
  Administered 2011-10-30: 75 mg via INTRAVENOUS

## 2011-10-30 MED ORDER — LACTATED RINGERS IV SOLN: INTRAVENOUS | Status: DC

## 2011-10-30 MED ORDER — LACTATED RINGERS IV SOLN
INTRAVENOUS | Status: DC
  Administered 2011-10-30 (×2): via INTRAVENOUS

## 2011-10-30 MED ORDER — FENTANYL CITRATE 0.05 MG/ML IJ SOLN
INTRAMUSCULAR | Status: DC | PRN
  Administered 2011-10-30: 50 ug via INTRAVENOUS

## 2011-10-30 MED ORDER — CIPROFLOXACIN IN D5W 200 MG/100ML IV SOLN
200.0000 mg | INTRAVENOUS | Status: AC
  Administered 2011-10-30: 200 mg via INTRAVENOUS

## 2011-10-30 MED ORDER — STERILE WATER FOR IRRIGATION IR SOLN
Status: DC | PRN
  Administered 2011-10-30: 1 via INTRAVESICAL

## 2011-10-30 MED ORDER — FENTANYL CITRATE 0.05 MG/ML IJ SOLN: 25.0000 ug | INTRAMUSCULAR | Status: DC | PRN

## 2011-10-30 SURGICAL SUPPLY — 29 items
BAG DRAIN URO-CYSTO SKYTR STRL (DRAIN) ×3 IMPLANT
BAG URINE DRAINAGE (UROLOGICAL SUPPLIES) IMPLANT
BAG URINE LEG 19OZ MD ST LTX (BAG) IMPLANT
CANISTER SUCT LVC 12 LTR MEDI- (MISCELLANEOUS) ×3 IMPLANT
CATH FOLEY 2WAY SLVR  5CC 20FR (CATHETERS)
CATH FOLEY 2WAY SLVR  5CC 22FR (CATHETERS)
CATH FOLEY 2WAY SLVR  5CC 24FR (CATHETERS) ×1
CATH FOLEY 2WAY SLVR 5CC 20FR (CATHETERS) IMPLANT
CATH FOLEY 2WAY SLVR 5CC 22FR (CATHETERS) IMPLANT
CATH FOLEY 2WAY SLVR 5CC 24FR (CATHETERS) ×2 IMPLANT
CLOTH BEACON ORANGE TIMEOUT ST (SAFETY) ×3 IMPLANT
DRAPE CAMERA CLOSED 9X96 (DRAPES) ×3 IMPLANT
ELECT BUTTON BIOP 24F 90D PLAS (MISCELLANEOUS) IMPLANT
ELECT LOOP HF 26F 30D .35MM (CUTTING LOOP) IMPLANT
ELECT LOOP MED HF 24F 12D CBL (CLIP) ×3 IMPLANT
ELECT REM PT RETURN 9FT ADLT (ELECTROSURGICAL) ×3
ELECT RESECT VAPORIZE 12D CBL (ELECTRODE) IMPLANT
ELECTRODE REM PT RTRN 9FT ADLT (ELECTROSURGICAL) ×2 IMPLANT
EVACUATOR MICROVAS BLADDER (UROLOGICAL SUPPLIES) ×3 IMPLANT
GLOVE BIO SURGEON STRL SZ8 (GLOVE) ×3 IMPLANT
GOWN PREVENTION PLUS LG XLONG (DISPOSABLE) ×3 IMPLANT
GOWN STRL REIN XL XLG (GOWN DISPOSABLE) ×3 IMPLANT
HOLDER FOLEY CATH W/STRAP (MISCELLANEOUS) IMPLANT
IV NS IRRIG 3000ML ARTHROMATIC (IV SOLUTION) ×3 IMPLANT
KIT ASPIRATION TUBING (SET/KITS/TRAYS/PACK) ×3 IMPLANT
LOOP CUTTING 24FR OLYMPUS (CUTTING LOOP) IMPLANT
PACK CYSTOSCOPY (CUSTOM PROCEDURE TRAY) ×3 IMPLANT
PLUG CATH AND CAP STER (CATHETERS) IMPLANT
WATER STERILE IRR 3000ML UROMA (IV SOLUTION) ×3 IMPLANT

## 2011-10-30 NOTE — Interval H&P Note (Signed)
History and Physical Interval Note:  10/30/2011 8:50 AM  Johnathan Strong  has presented today for surgery, with the diagnosis of Bladder Tumors  The various methods of treatment have been discussed with the patient and family. After consideration of risks, benefits and other options for treatment, the patient has consented to  Procedure(s) (LRB): TRANSURETHRAL RESECTION OF BLADDER TUMOR (TURBT) (N/A) as a surgical intervention .  The patient's history has been reviewed, patient examined, no change in status, stable for surgery.  I have reviewed the patient's chart and labs.  Questions were answered to the patient's satisfaction.     Garnett Farm

## 2011-10-30 NOTE — Transfer of Care (Signed)
Immediate Anesthesia Transfer of Care Note  Patient: Johnathan Strong  Procedure(s) Performed: Procedure(s) (LRB): CYSTOSCOPY WITH BIOPSY (N/A)  Patient Location: Patient transported to PACU with oxygen via face mask at 4 Liters / Min  Anesthesia Type: General  Level of Consciousness: awake and alert   Airway & Oxygen Therapy: Patient Spontanous Breathing and Patient connected to face mask oxygen  Post-op Assessment: Report given to PACU RN and Post -op Vital signs reviewed and stable  Post vital signs: Reviewed and stable  Dentition: Teeth and oropharynx remain in pre-op condition  Complications: No apparent anesthesia complications

## 2011-10-30 NOTE — Op Note (Signed)
PATIENT:  Johnathan Strong  PRE-OPERATIVE DIAGNOSIS:  Bladder tumors  POST-OPERATIVE DIAGNOSIS:  Same  PROCEDURE:  Procedure(s): Cold cup biopsy and fulguration of bladder tumors  SURGEON:  Garnett Farm  INDICATION: Johnathan Strong is an 78 year old male with a past history of transitional cell carcinoma of the bladder it was originally resected in 8/10 and found to be low grade, superficial papillary disease. He was followed with surveillance cystoscopy and found to have a recurrence on the right posterior wall in the area of his previous resection. He is brought to the operating room today for biopsy and fulguration.  ANESTHESIA:  General  EBL:  Minimal  DRAINS: None  LOCAL MEDICATIONS USED:  None  SPECIMEN:  Cold cup biopsies of bladder tumor  DISPOSITION OF SPECIMEN:  PATHOLOGY  Description of procedure: After informed consent the patient was taken to the major or, placed on the table and administered general anesthesia. He was then moved to the dorsal lithotomy position and his genitalia sterilely prepped and draped. An official timeout was then performed.  The 22 French cystoscope was passed under direct vision down the urethra and into the prostatic urethra. No lesions and urethra were noted. The prostatic urethra revealed no obstruction and was photographed. There were no lesions in the prostatic urethra. Upon entering the bladder I did a full and systematic inspection of the entire surface of the bladder with both the 12 and 70 lenses. There was a papillary tumor associated with the scar and then what appeared to be some further papillary tumor just inferior to this over on the right wall of the bladder. No other lesions were identified.  The cold cup biopsy forceps were then introduced and I obtained biopsies of the papillary lesion. I then used the Bugbee electrode to fulgurate the location where the lesion was biopsied as well as the surrounding mucosa so that the entire area was  completely treated. This was well tolerated. There was no bleeding at the end of the procedure as I drained the bladder and removed the cystoscope. The patient was then awakened and taken recovery room in stable and satisfactory condition. He tolerated the procedure well with no intraoperative complications.  PLAN OF CARE: Discharge to home after PACU  PATIENT DISPOSITION:  PACU - hemodynamically stable.

## 2011-10-30 NOTE — Anesthesia Procedure Notes (Signed)
Procedure Name: LMA Insertion Date/Time: 10/30/2011 9:35 AM Performed by: Fran Lowes Pre-anesthesia Checklist: Patient identified, Emergency Drugs available, Suction available and Patient being monitored Patient Re-evaluated:Patient Re-evaluated prior to inductionOxygen Delivery Method: Circle System Utilized Preoxygenation: Pre-oxygenation with 100% oxygen Intubation Type: IV induction Ventilation: Mask ventilation without difficulty LMA: LMA inserted LMA Size: 4.0 Number of attempts: 1 Airway Equipment and Method: bite block Placement Confirmation: positive ETCO2 Tube secured with: Tape Dental Injury: Teeth and Oropharynx as per pre-operative assessment

## 2011-10-30 NOTE — Anesthesia Preprocedure Evaluation (Addendum)
Anesthesia Evaluation  Patient identified by MRN, date of birth, ID band Patient awake    Reviewed: Allergy & Precautions, H&P , NPO status , Patient's Chart, lab work & pertinent test results  Airway Mallampati: II TM Distance: >3 FB Neck ROM: Full    Dental  (+) Teeth Intact and Dental Advisory Given   Pulmonary shortness of breath and with exertion, COPD COPD inhaler,  breath sounds clear to auscultation  Pulmonary exam normal       Cardiovascular hypertension, Pt. on medications Rhythm:Regular Rate:Normal     Neuro/Psych TIAnegative psych ROS   GI/Hepatic negative GI ROS, Neg liver ROS,   Endo/Other  negative endocrine ROS  Renal/GU negative Renal ROS   BPH negative genitourinary   Musculoskeletal negative musculoskeletal ROS (+)   Abdominal   Peds negative pediatric ROS (+)  Hematology negative hematology ROS (+) CLL   Anesthesia Other Findings   Reproductive/Obstetrics negative OB ROS                          Anesthesia Physical Anesthesia Plan  ASA: III  Anesthesia Plan: General   Post-op Pain Management:    Induction: Intravenous  Airway Management Planned: LMA  Additional Equipment:   Intra-op Plan:   Post-operative Plan: Extubation in OR  Informed Consent: I have reviewed the patients History and Physical, chart, labs and discussed the procedure including the risks, benefits and alternatives for the proposed anesthesia with the patient or authorized representative who has indicated his/her understanding and acceptance.   Dental advisory given  Plan Discussed with: CRNA  Anesthesia Plan Comments:         Anesthesia Quick Evaluation

## 2011-10-31 ENCOUNTER — Other Ambulatory Visit: Payer: Self-pay | Admitting: Internal Medicine

## 2011-10-31 ENCOUNTER — Encounter (HOSPITAL_BASED_OUTPATIENT_CLINIC_OR_DEPARTMENT_OTHER): Payer: Self-pay | Admitting: Urology

## 2011-10-31 NOTE — Anesthesia Postprocedure Evaluation (Signed)
Anesthesia Post Note  Patient: Johnathan Strong  Procedure(s) Performed: Procedure(s) (LRB): CYSTOSCOPY WITH BIOPSY (N/A)  Anesthesia type: General  Patient location: PACU  Post pain: Pain level controlled  Post assessment: Post-op Vital signs reviewed  Last Vitals:  Filed Vitals:   10/30/11 1135  BP: 147/61  Pulse: 59  Temp: 37 C  Resp: 20    Post vital signs: Reviewed  Level of consciousness: sedated  Complications: No apparent anesthesia complications

## 2011-11-24 ENCOUNTER — Telehealth: Payer: Self-pay

## 2011-11-24 NOTE — Telephone Encounter (Signed)
Patient called no answer.Left message on personal voice mail Dr.Jordan received your letter about your elevated B/P readings.Dr.Jordan advised to increase amlodipine to 5 mg 11/2 tablets daily.Advised to continue to monitor B/P and if continues to be elevated call office.

## 2012-01-29 ENCOUNTER — Ambulatory Visit: Payer: Medicare Other | Attending: Family Medicine | Admitting: Physical Therapy

## 2012-01-29 DIAGNOSIS — M542 Cervicalgia: Secondary | ICD-10-CM | POA: Insufficient documentation

## 2012-01-29 DIAGNOSIS — IMO0001 Reserved for inherently not codable concepts without codable children: Secondary | ICD-10-CM | POA: Insufficient documentation

## 2012-01-29 DIAGNOSIS — R5381 Other malaise: Secondary | ICD-10-CM | POA: Insufficient documentation

## 2012-01-29 DIAGNOSIS — M2560 Stiffness of unspecified joint, not elsewhere classified: Secondary | ICD-10-CM | POA: Insufficient documentation

## 2012-01-31 ENCOUNTER — Ambulatory Visit: Payer: Medicare Other | Admitting: Physical Therapy

## 2012-02-05 ENCOUNTER — Other Ambulatory Visit: Payer: Self-pay | Admitting: Dermatology

## 2012-02-07 ENCOUNTER — Ambulatory Visit: Payer: Medicare Other | Attending: Family Medicine | Admitting: Physical Therapy

## 2012-02-07 DIAGNOSIS — IMO0001 Reserved for inherently not codable concepts without codable children: Secondary | ICD-10-CM | POA: Insufficient documentation

## 2012-02-07 DIAGNOSIS — M542 Cervicalgia: Secondary | ICD-10-CM | POA: Insufficient documentation

## 2012-02-07 DIAGNOSIS — M2560 Stiffness of unspecified joint, not elsewhere classified: Secondary | ICD-10-CM | POA: Insufficient documentation

## 2012-02-07 DIAGNOSIS — R5381 Other malaise: Secondary | ICD-10-CM | POA: Insufficient documentation

## 2012-02-09 ENCOUNTER — Ambulatory Visit: Payer: Medicare Other | Admitting: Physical Therapy

## 2012-02-12 ENCOUNTER — Ambulatory Visit: Payer: Medicare Other | Admitting: Physical Therapy

## 2012-02-14 ENCOUNTER — Ambulatory Visit: Payer: Medicare Other | Admitting: Physical Therapy

## 2012-02-16 ENCOUNTER — Ambulatory Visit: Payer: Medicare Other | Admitting: Physical Therapy

## 2012-02-19 ENCOUNTER — Ambulatory Visit: Payer: Medicare Other | Admitting: Physical Therapy

## 2012-02-21 ENCOUNTER — Ambulatory Visit: Payer: Medicare Other | Admitting: Physical Therapy

## 2012-02-22 ENCOUNTER — Ambulatory Visit: Payer: Medicare Other | Admitting: Internal Medicine

## 2012-02-26 ENCOUNTER — Encounter: Payer: Self-pay | Admitting: Internal Medicine

## 2012-02-26 ENCOUNTER — Ambulatory Visit (INDEPENDENT_AMBULATORY_CARE_PROVIDER_SITE_OTHER)
Admission: RE | Admit: 2012-02-26 | Discharge: 2012-02-26 | Disposition: A | Discharge status: Home / Self Care | Payer: Medicare Other | Source: Ambulatory Visit | Attending: Internal Medicine | Admitting: Internal Medicine

## 2012-02-26 ENCOUNTER — Ambulatory Visit (INDEPENDENT_AMBULATORY_CARE_PROVIDER_SITE_OTHER): Payer: Medicare Other | Admitting: Internal Medicine

## 2012-02-26 VITALS — BP 134/60 | HR 60 | Temp 97.8°F | Ht 68.0 in | Wt 169.0 lb

## 2012-02-26 DIAGNOSIS — J449 Chronic obstructive pulmonary disease, unspecified: Secondary | ICD-10-CM

## 2012-02-26 DIAGNOSIS — I119 Hypertensive heart disease without heart failure: Secondary | ICD-10-CM

## 2012-02-26 DIAGNOSIS — C9100 Acute lymphoblastic leukemia not having achieved remission: Secondary | ICD-10-CM

## 2012-02-26 DIAGNOSIS — IMO0001 Reserved for inherently not codable concepts without codable children: Secondary | ICD-10-CM

## 2012-02-26 NOTE — Assessment & Plan Note (Signed)
-   HFA  75%  p coaching 02/26/2012    - Spirometry 02/26/2012 FEV1  1.31 (46%) ratio 47  GOLD III s tendency to aecopd and loosing ground last sev months in terms of activity tol but also has apparent sign anemia related to dx of cll.  No significant bronchitis but does feel better p saba so rec  The proper method of use, as well as anticipated side effects, of a metered-dose inhaler are discussed and demonstrated to the patient. Improved effectiveness after extensive coaching during this visit to a level of approximately  75% > keep working on technique   Also rec continue spiriva as not bothering his bladder fnx and probably helpful in this setting.

## 2012-02-26 NOTE — Assessment & Plan Note (Signed)
hgb still 13.6 3 months prior to OV  So doubt contributing to sob

## 2012-02-26 NOTE — Patient Instructions (Addendum)
Work on inhaler technique:  relax and gently blow all the way out then take a nice smooth deep breath back in, triggering the inhaler at same time you start breathing in.  Hold for up to 5 seconds if you can.  Rinse and gargle with water when done   If your mouth or throat starts to bother you,   I suggest you time the inhaler to your dental care and after using the inhaler(s) brush teeth and tongue with a baking soda containing toothpaste and when you rinse this out, gargle with it first to see if this helps your mouth and throat.     Please remember to go to the xray department downstairs for your tests - we will call you with the results when they are available.  We can see you back in the [place] clinic the first Tuesday of any month, sooner here if needed in the meantime

## 2012-02-26 NOTE — Assessment & Plan Note (Signed)
Echo 01/03/11 nl LA size so doubt this is contributing to doe.

## 2012-02-26 NOTE — Progress Notes (Signed)
  Subjective:    Patient ID: Johnathan Strong, male    DOB: 07-Apr-1923    MRN: [id number]  HPI  80 yowm quit smoking  In the 1960's then around 2000 dx with pneumonia and never completely recovered "breathing capacity" and slow deterioration since then so referred by Dr Christell Constant to pulmonary clinic in [place] in June 2012   08/30/2010 Initial pulmonary office eval in EMR era cc variable doe x 10 years seems worse high heat / humidity but even in cool weather has sense can't get a deep breath.  Sleeping ok without nocturnal  or early am exac of resp c/o's or need for noct saba.  On advair and spiriva each am plus freq use of albuterol esp before church "otherwise can't complete the verse".  rec Stop advair Start symbicort 160  Take 2 puffs first thing in am and then another 2 puffs about 12 hours later - this should improve your breathing and reduce your need for albuterol.  If not, we'll need to see you in [facility] for cxr and full pft's - call [phone] to arrange this Work on perfecting inhaler technique   02/26/2012 f/u ov/[name] cc new doe x trash to street new indolent onset since 3-4 months p transiently feeling sob better on symbicort 160 Take 2 puffs first thing in am and then another 2 puffs about 12 hours later.  Does some better on ventolin despite suboptimal hfa technique  No obvious daytime variabilty or assoc chronic cough or cp or chest tightness, subjective wheeze overt sinus or hb symptoms. No unusual exp hx or h/o childhood pna/ asthma or premature birth to his knowledge.   Also dx of anemia since prev ov in setting off cll but not requiring tx  Sleeping ok without nocturnal  or early am exacerbation  of respiratory  c/o's or need for noct saba. Also denies any obvious fluctuation of symptoms with weather or environmental changes or other aggravating or alleviating factors except as outlined above   ROS  The following are not active complaints unless bolded sore throat, dysphagia,  dental problems, itching, sneezing,  nasal congestion or excess/ purulent secretions, ear ache,   fever, chills, sweats, unintended wt loss, pleuritic or exertional cp, hemoptysis,  orthopnea pnd or leg swelling, presyncope, palpitations, heartburn, abdominal pain, anorexia, nausea, vomiting, diarrhea  or change in bowel or urinary habits, change in stools or urine, dysuria,hematuria,  rash, arthralgias, visual complaints, headache, numbness weakness or ataxia or problems with walking or coordination,  change in mood/affect or memory.                 Objective:   Physical Exam Wt Readings from Last 3 Encounters:  02/26/12 169 lb (76.658 kg)  10/30/11 168 lb (76.204 kg)  10/30/11 168 lb (76.204 kg)      pleasant relatively thin amb wm nad  Minimally hoarse, no pseudowheeze HEENT mild turbinate edema.  Oropharynx no thrush or excess pnd or cobblestoning.  No JVD or cervical adenopathy. Mild accessory muscle hypertrophy. Trachea midline, nl thryroid. Chest was hyperinflated by percussion with diminished breath sounds and moderate increased exp time without wheeze. Hoover sign positive at mid inspiration. Regular rate and rhythm without murmur gallop or rub or increase P2 or edema.  Abd: no hsm, nl excursion. Ext warm without cyanosis or clubbing.     CXR  02/26/2012 :   No acute disease    Assessment & Plan:

## 2012-02-29 ENCOUNTER — Telehealth: Payer: Self-pay | Admitting: Internal Medicine

## 2012-02-29 NOTE — Telephone Encounter (Signed)
Spoke with patient, informed him of results/recs as listed below per Dr. Sherene Sires.  Pt verbalized understanding and nothing further needed at this time.   Result Note     Call pt: Reviewed cxr and no acute change so no change in recommendations made at [facility]

## 2012-02-29 NOTE — Progress Notes (Signed)
Quick Note:  Spoke with patient, informed him of results/recs as listed below per Dr. Sherene Sires. Pt verbalized understanding and nothing further needed at this time. ______

## 2012-03-11 ENCOUNTER — Ambulatory Visit: Payer: Medicare Other | Attending: Family Medicine | Admitting: Physical Therapy

## 2012-03-11 DIAGNOSIS — M542 Cervicalgia: Secondary | ICD-10-CM | POA: Insufficient documentation

## 2012-03-11 DIAGNOSIS — M2560 Stiffness of unspecified joint, not elsewhere classified: Secondary | ICD-10-CM | POA: Insufficient documentation

## 2012-03-11 DIAGNOSIS — IMO0001 Reserved for inherently not codable concepts without codable children: Secondary | ICD-10-CM | POA: Insufficient documentation

## 2012-03-11 DIAGNOSIS — R5381 Other malaise: Secondary | ICD-10-CM | POA: Insufficient documentation

## 2012-03-11 NOTE — Progress Notes (Signed)
Quick Note:  Pt aware of results. ______ 

## 2012-03-15 ENCOUNTER — Ambulatory Visit: Payer: Medicare Other | Admitting: Physical Therapy

## 2012-03-19 ENCOUNTER — Ambulatory Visit: Payer: Medicare Other | Admitting: Physical Therapy

## 2012-03-21 ENCOUNTER — Ambulatory Visit: Payer: Medicare Other | Admitting: Physical Therapy

## 2012-03-26 ENCOUNTER — Ambulatory Visit: Payer: Medicare Other | Admitting: Physical Therapy

## 2012-03-29 ENCOUNTER — Ambulatory Visit: Payer: Medicare Other | Admitting: *Deleted

## 2012-04-02 ENCOUNTER — Encounter: Payer: Medicare Other | Admitting: Physical Therapy

## 2012-04-05 ENCOUNTER — Ambulatory Visit: Payer: Medicare Other | Admitting: Physical Therapy

## 2012-04-08 ENCOUNTER — Telehealth: Payer: Self-pay | Admitting: *Deleted

## 2012-04-08 NOTE — Telephone Encounter (Signed)
I [id number] to move the pt up to a sooner appt for [facility]. Dr. Sherene Sires is leaving at 11am. Carron Curie, CMA

## 2012-04-09 ENCOUNTER — Encounter: Payer: Self-pay | Admitting: Internal Medicine

## 2012-04-09 ENCOUNTER — Ambulatory Visit (INDEPENDENT_AMBULATORY_CARE_PROVIDER_SITE_OTHER): Payer: Medicare Other | Admitting: Internal Medicine

## 2012-04-09 ENCOUNTER — Ambulatory Visit: Payer: Medicare Other | Admitting: Internal Medicine

## 2012-04-09 VITALS — BP 130/82 | HR 60 | Temp 98.0°F | Ht 68.0 in | Wt 168.0 lb

## 2012-04-09 DIAGNOSIS — I119 Hypertensive heart disease without heart failure: Secondary | ICD-10-CM

## 2012-04-09 DIAGNOSIS — J449 Chronic obstructive pulmonary disease, unspecified: Secondary | ICD-10-CM

## 2012-04-09 NOTE — Progress Notes (Signed)
Subjective:    Patient ID: Johnathan Strong, male    DOB: [birth year]    MRN: [id number]  HPI  [id number] quit smoking  In the 1960's then around 2000 dx with pneumonia and never completely recovered "breathing capacity" and slow deterioration since then so referred by Dr Christell Constant to pulmonary clinic in [place] in June 2012   08/30/2010 Initial pulmonary office eval in EMR era cc variable doe x 10 years seems worse high heat / humidity but even in cool weather has sense can't get a deep breath.  Sleeping ok without nocturnal  or early am exac of resp c/o's or need for noct saba.  On advair and spiriva each am plus freq use of albuterol esp before church "otherwise can't complete the verse".  rec Stop advair Start symbicort 160  Take 2 puffs first thing in am and then another 2 puffs about 12 hours later - this should improve your breathing and reduce your need for albuterol.  If not, we'll need to see you in [facility] for cxr and full pft's - call [phone] to arrange this Work on perfecting inhaler technique   02/26/2012 f/u ov/Wert cc new doe x trash to street new indolent onset since 3-4 months p transiently feeling sob better on symbicort 160 Take 2 puffs first thing in am and then another 2 puffs about 12 hours later.  Does some better on ventolin despite suboptimal hfa technique rec Work on inhaler technique    04/09/2012 f/u ov/Wert cc some better able to drag uphill slightly to street s stopping and able to do steps and avg once daily use   No obvious daytime variabilty or assoc chronic cough or cp or chest tightness, subjective wheeze overt sinus or hb symptoms. No unusual exp hx or h/o childhood pna/ asthma or premature birth to his knowledge.   Also dx of anemia since prev ov in setting off cll but not requiring tx  Sleeping ok without nocturnal  or early am exacerbation  of respiratory  c/o's or need for noct saba. Also denies any obvious fluctuation of symptoms with weather or  environmental changes or other aggravating or alleviating factors except as outlined above   ROS  The following are not active complaints unless bolded sore throat, dysphagia, dental problems, itching, sneezing,  nasal congestion or excess/ purulent secretions, ear ache,   fever, chills, sweats, unintended wt loss, pleuritic or exertional cp, hemoptysis,  orthopnea pnd or leg swelling, presyncope, palpitations, heartburn, abdominal pain, anorexia, nausea, vomiting, diarrhea  or change in bowel or urinary habits, change in stools or urine, dysuria,hematuria,  rash, arthralgias, visual complaints, headache, numbness weakness or ataxia or problems with walking or coordination,  change in mood/affect or memory.                 Objective:   Physical Exam Wt 168 04/09/2012  Wt Readings from Last 3 Encounters:  02/26/12 169 lb (76.658 kg)  10/30/11 168 lb (76.204 kg)  10/30/11 168 lb (76.204 kg)      pleasant relatively thin amb wm nad  Minimally hoarse, no pseudowheeze HEENT mild turbinate edema.  Oropharynx no thrush or excess pnd or cobblestoning.  No JVD or cervical adenopathy. Mild accessory muscle hypertrophy. Trachea midline, nl thryroid. Chest was hyperinflated by percussion with diminished breath sounds and moderate increased exp time without wheeze. Hoover sign positive at mid inspiration. Regular rate and rhythm without murmur gallop or rub or increase P2 or edema.  Abd: no hsm,  nl excursion. Ext warm without cyanosis or clubbing.     CXR  02/26/2012 :   No acute disease    Assessment & Plan:

## 2012-04-09 NOTE — Assessment & Plan Note (Signed)
-   HFA  75%  p coaching 02/26/2012    - Spirometry 02/26/2012 FEV1  1.31 (46%) ratio 47  GOLD III but symptoms well controlled on present rx   If becomes worse first step would be to change to bystolic instead of coreg but so far tolerating this beta blocker ok    Each maintenance medication was reviewed in detail including most importantly the difference between maintenance and as needed and under what circumstances the prns are to be used.  Please see instructions for details which were reviewed in writing and the patient given a copy.

## 2012-04-09 NOTE — Patient Instructions (Addendum)
No change in your medications  If needing ventolin more, we may need to get you a different Beta blocker (bystolic or bisoprolol is the generic)  Please schedule a follow up visit in 6 months but call sooner if needed

## 2012-04-09 NOTE — Assessment & Plan Note (Signed)
Strongly prefer in this setting: Bystolic, the most beta -1  selective Beta blocker available in sample form, with bisoprolol the most selective generic choice  on the market.  However, doing ok on coreg for now so will rec continue coreg unless B agonist dependency increases above present level

## 2012-04-12 NOTE — Telephone Encounter (Signed)
We saw pt on 04-09-12 in [place]. Carron Curie, CMA

## 2012-06-18 ENCOUNTER — Encounter: Payer: Self-pay | Admitting: Family Medicine

## 2012-06-18 ENCOUNTER — Ambulatory Visit (INDEPENDENT_AMBULATORY_CARE_PROVIDER_SITE_OTHER): Payer: Medicare Other | Admitting: Family Medicine

## 2012-06-18 VITALS — BP 137/58 | HR 67 | Temp 96.8°F | Ht 69.0 in | Wt 169.0 lb

## 2012-06-18 DIAGNOSIS — K30 Functional dyspepsia: Secondary | ICD-10-CM

## 2012-06-18 DIAGNOSIS — E349 Endocrine disorder, unspecified: Secondary | ICD-10-CM

## 2012-06-18 DIAGNOSIS — C9110 Chronic lymphocytic leukemia of B-cell type not having achieved remission: Secondary | ICD-10-CM

## 2012-06-18 DIAGNOSIS — K3189 Other diseases of stomach and duodenum: Secondary | ICD-10-CM

## 2012-06-18 DIAGNOSIS — R42 Dizziness and giddiness: Secondary | ICD-10-CM

## 2012-06-18 DIAGNOSIS — J309 Allergic rhinitis, unspecified: Secondary | ICD-10-CM

## 2012-06-18 DIAGNOSIS — E291 Testicular hypofunction: Secondary | ICD-10-CM

## 2012-06-18 DIAGNOSIS — I10 Essential (primary) hypertension: Secondary | ICD-10-CM

## 2012-06-18 DIAGNOSIS — R0602 Shortness of breath: Secondary | ICD-10-CM

## 2012-06-18 NOTE — Progress Notes (Signed)
  Subjective:    Patient ID: Johnathan Strong, male    DOB: June 17, 1923, 79 y.o.   MRN: [id number]  HPI This patient presents for recheck of multiple medical problems. No one accompanies the patient today.  Patient Active Problem List  Diagnosis  . COPD GOLD III  . Hypertension  . Dyslipidemia  . Chronic lymphoblastic leukemia    In addition, see ROS. Respiratory-wise his condition involving his allergies and breathing seem to be pretty stable at this point.  The allergies, current medications, past medical history, surgical history, family and social history are reviewed.  Immunizations reviewed.  Health maintenance reviewed.  The following items are outstanding: none      Review of Systems  Constitutional: Negative.   HENT: Negative.   Eyes: Negative.   Respiratory: Positive for shortness of breath (with exertion). Negative for cough and wheezing.   Cardiovascular: Positive for leg swelling (pedal edema).  Gastrointestinal: Positive for abdominal pain (indigestion, relieved by antacids).  Endocrine: Negative.   Genitourinary: Negative.   Musculoskeletal: Negative.   Skin: Negative.   Allergic/Immunologic: Negative.   Neurological: Positive for dizziness (with exercise).       Objective:   Physical Exam BP 137/58  Pulse 67  Temp(Src) 96.8 F (36 C) (Oral)  Ht 5\' 9"  (1.753 m)  Wt 169 lb (76.658 kg)  BMI 24.95 kg/m2  The patient appeared well nourished and normally developed, alert and oriented to time and place. Speech, behavior and judgement appear normal. Vital signs as documented.  Head exam is unremarkable. No scleral icterus or pallor noted. Minimal head congestion.  Neck is without jugular venous distension, thyromegally, or carotid bruits. Carotid upstrokes are brisk bilaterally. No cervical adenopathy. Lungs are clear anteriorly and posteriorly to auscultation. Normal respiratory effort. Cardiac exam reveals regular rate and rhythm@ 60/min. First and  second heart sounds normal. No murmurs, rubs or gallops.  Abdominal exam reveals normal bowl sounds, no masses, no organomegaly and no aortic enlargement. No inguinal adenopathy. Extremities are nonedematous and both femoral and pedal pulses are normal. Skin without pallor or jaundice.  Warm and dry, without rash. Neurologic exam reveals normal deep tendon reflexes and normal sensation.          Assessment & Plan:   1. Shortness of breath - Ambulatory referral to Cardiology  2. Dizziness and giddiness - Ambulatory referral to Cardiology  3. CLL (chronic lymphocytic leukemia) Lab work to be arranged  4. Testosterone deficiency  5. Acid indigestion Patient will try Zantac 75 over-the-counter  6. Allergic rhinitis Continue current allergy treatment  7. Hypertension Continue current meds for hypertnsion  Patient Instructions  FOBT given  Fall precautions discussed Continue current meds and therapeutic lifestyle changes Try Zantac 75 one twice daily before breakfast and supper for one to 2 weeks  decrease caffeine intake  We will arrange visit with Dr. Patty Sermons someone from the office will be calling you  Finish AndroGel, and start testosterone injections 200 mg monthly in a couple weeks  Get testosterone level 3-1/2 months after starting testosterone injection

## 2012-06-18 NOTE — Patient Instructions (Addendum)
FOBT given  Fall precautions discussed Continue current meds and therapeutic lifestyle changes Try Zantac 75 one twice daily before breakfast and supper for one to 2 weeks  decrease caffeine intake  We will arrange visit with Dr. Patty Sermons someone from the office will be calling you  Finish AndroGel, and start testosterone injections 200 mg monthly in a couple weeks  Get testosterone level 3-1/2 months after starting testosterone injection

## 2012-06-20 ENCOUNTER — Other Ambulatory Visit (INDEPENDENT_AMBULATORY_CARE_PROVIDER_SITE_OTHER): Payer: Medicare Other

## 2012-06-20 DIAGNOSIS — E291 Testicular hypofunction: Secondary | ICD-10-CM

## 2012-06-20 DIAGNOSIS — E349 Endocrine disorder, unspecified: Secondary | ICD-10-CM

## 2012-06-20 NOTE — Progress Notes (Unsigned)
Patient came in for labs only.

## 2012-06-21 LAB — TESTOSTERONE, TOTAL AND FREE DIRECT MEASURE: Testosterone: 211 ng/dL — ABNORMAL LOW (ref 300–890)

## 2012-07-04 DIAGNOSIS — C9110 Chronic lymphocytic leukemia of B-cell type not having achieved remission: Secondary | ICD-10-CM | POA: Insufficient documentation

## 2012-07-05 ENCOUNTER — Other Ambulatory Visit (INDEPENDENT_AMBULATORY_CARE_PROVIDER_SITE_OTHER): Payer: Medicare Other

## 2012-07-05 DIAGNOSIS — Z1212 Encounter for screening for malignant neoplasm of rectum: Secondary | ICD-10-CM

## 2012-07-05 NOTE — Progress Notes (Signed)
Patient dropped off fobt 

## 2012-07-09 ENCOUNTER — Ambulatory Visit (INDEPENDENT_AMBULATORY_CARE_PROVIDER_SITE_OTHER): Payer: Medicare Other | Admitting: *Deleted

## 2012-07-09 DIAGNOSIS — E291 Testicular hypofunction: Secondary | ICD-10-CM

## 2012-07-09 DIAGNOSIS — E349 Endocrine disorder, unspecified: Secondary | ICD-10-CM

## 2012-07-09 MED ORDER — TESTOSTERONE CYPIONATE 200 MG/ML IM SOLN
200.0000 mg | INTRAMUSCULAR | Status: DC
  Administered 2012-07-09 – 2012-09-05 (×3): 200 mg via INTRAMUSCULAR

## 2012-07-09 NOTE — Patient Instructions (Signed)
Testosterone injection What is this medicine? TESTOSTERONE (tes TOS ter one) is the main male hormone. It supports normal male development such as muscle growth, facial hair, and deep voice. It is used in males to treat low testosterone levels. This medicine may be used for other purposes; ask your health care provider or pharmacist if you have questions. What should I tell my health care provider before I take this medicine? They need to know if you have any of these conditions: -breast cancer -diabetes -heart disease -kidney disease -liver disease -lung disease -prostate cancer, enlargement -an unusual or allergic reaction to testosterone, other medicines, foods, dyes, or preservatives -pregnant or trying to get pregnant -breast-feeding How should I use this medicine? This medicine is for injection into a muscle. It is usually given by a health care professional in a hospital or clinic setting. Contact your pediatrician regarding the use of this medicine in children. While this medicine may be prescribed for children as young as 12 years of age for selected conditions, precautions do apply. Overdosage: If you think you have taken too much of this medicine contact a poison control center or emergency room at once. NOTE: This medicine is only for you. Do not share this medicine with others. What if I miss a dose? Try not to miss a dose. Your doctor or health care professional will tell you when your next injection is due. Notify the office if you are unable to keep an appointment. What may interact with this medicine? -medicines for diabetes -medicines that treat or prevent blood clots like warfarin -oxyphenbutazone -propranolol -steroid medicines like prednisone or cortisone This list may not describe all possible interactions. Give your health care provider a list of all the medicines, herbs, non-prescription drugs, or dietary supplements you use. Also tell them if you smoke, drink  alcohol, or use illegal drugs. Some items may interact with your medicine. What should I watch for while using this medicine? Visit your doctor or health care professional for regular checks on your progress. They will need to check the level of testosterone in your blood. This medicine may affect blood sugar levels. If you have diabetes, check with your doctor or health care professional before you change your diet or the dose of your diabetic medicine. This drug is banned from use in athletes by most athletic organizations. What side effects may I notice from receiving this medicine? Side effects that you should report to your doctor or health care professional as soon as possible: -allergic reactions like skin rash, itching or hives, swelling of the face, lips, or tongue -breast enlargement -breathing problems -changes in mood, especially anger, depression, or rage -dark urine -general ill feeling or flu-like symptoms -light-colored stools -loss of appetite, nausea -nausea, vomiting -right upper belly pain -stomach pain -swelling of ankles -too frequent or persistent erections -trouble passing urine or change in the amount of urine -unusually weak or tired -yellowing of the eyes or skin Additional side effects that can occur in women include: -deep or hoarse voice -facial hair growth -irregular menstrual periods Side effects that usually do not require medical attention (report to your doctor or health care professional if they continue or are bothersome): -acne -change in sex drive or performance -hair loss -headache This list may not describe all possible side effects. Call your doctor for medical advice about side effects. You may report side effects to FDA at 1-800-FDA-1088. Where should I keep my medicine? Keep out of the reach of children. This medicine   can be abused. Keep your medicine in a safe place to protect it from theft. Do not share this medicine with anyone.  Selling or giving away this medicine is dangerous and against the law. Store at room temperature between 20 and 25 degrees C (68 and 77 degrees F). Do not freeze. Protect from light. Follow the directions for the product you are prescribed. Throw away any unused medicine after the expiration date. NOTE: This sheet is a summary. It may not cover all possible information. If you have questions about this medicine, talk to your doctor, pharmacist, or health care provider.  2013, [employer]. (05/03/2007 4:13:46 PM)  

## 2012-07-10 ENCOUNTER — Ambulatory Visit: Payer: Medicare Other | Admitting: Cardiology

## 2012-08-02 ENCOUNTER — Ambulatory Visit (INDEPENDENT_AMBULATORY_CARE_PROVIDER_SITE_OTHER): Payer: Medicare Other | Admitting: Cardiology

## 2012-08-02 ENCOUNTER — Encounter: Payer: Self-pay | Admitting: Cardiology

## 2012-08-02 VITALS — BP 142/72 | HR 52 | Ht 68.0 in | Wt 169.4 lb

## 2012-08-02 DIAGNOSIS — E785 Hyperlipidemia, unspecified: Secondary | ICD-10-CM

## 2012-08-02 DIAGNOSIS — R0609 Other forms of dyspnea: Secondary | ICD-10-CM

## 2012-08-02 DIAGNOSIS — I119 Hypertensive heart disease without heart failure: Secondary | ICD-10-CM

## 2012-08-02 DIAGNOSIS — R42 Dizziness and giddiness: Secondary | ICD-10-CM

## 2012-08-02 NOTE — Patient Instructions (Signed)
Your physician has requested that you have a lexiscan myoview. For further information please visit [email or web address] Please follow instruction sheet, as given.  Your physician recommends that you continue on your current medications as directed. Please refer to the Current Medication list given to you today.  Your physician wants you to follow-up in: 6 months You will receive a reminder letter in the mail two months in advance. If you don't receive a letter, please call our office to schedule the follow-up appointment.

## 2012-08-02 NOTE — Assessment & Plan Note (Signed)
He has been concerned about his exertional dyspnea.  This may be from his known COPD.  However we will have him return for a walking Lexus scan Myoview stress test to rule out significant ischemia.

## 2012-08-02 NOTE — Assessment & Plan Note (Signed)
Blood pressures remaining stable on current therapy. 

## 2012-08-02 NOTE — Progress Notes (Signed)
Johnathan Strong Date of Birth:  01-01-1924 [facility] [address] [place], [place]  [address] [phone]         Fax   [phone]  History of Present Illness: This pleasant 79 year old gentleman is seen after a long absence. We last saw him in October 2012. He has a past history of COPD with exertional dyspnea. He also has a history of essential hypertension. He does not have any history of known coronary artery disease. He had a nuclear stress test in May 2007, which showed no reversible ischemia and he had a normal ejection fraction. Recently, he had a stress Cardiolite on 06/24/09, which showed no ischemia and showed an ejection fraction of 73%. He has inferolateral T wave inversion at rest, which become upright during recovery.  He has been seen by Dr. Sherene Sires regarding his dyspnea. Dr. Sherene Sires switched him to Symbicort, which has helped.  The patient recently had a close friend who had unexpected coronary disease and required a coronary stent.  The patient has been concerned that his dyspnea might represent underlying ischemic heart disease.  The patient also has had occasional exertional dizzy spells.  These are not frequent however.  He had one last autumn while raking leaves, relieved by rest.  He had another dizzy spell in February 2014 when he tried to use some exercise equipment at the gym.  Current Outpatient Prescriptions  Medication Sig Dispense Refill  . albuterol (VENTOLIN HFA) 108 (90 BASE) MCG/ACT inhaler Inhale 2 puffs into the lungs every 6 (six) hours as needed.       . Azelastine HCl (ASTELIN NA) Place into the nose as needed.      . carvedilol (COREG) 12.5 MG tablet Take 1 tablet by mouth Twice daily.      . Cholecalciferol (VITAMIN D3) 1000 UNITS CAPS Take 1 capsule by mouth daily.       . diphenhydramine-acetaminophen (TYLENOL PM) 25-500 MG TABS Take 1 tablet by mouth at bedtime as needed.       . dipyridamole (PERSANTINE) 50 MG tablet Take 50 mg by  mouth Twice daily.       . fluticasone (FLONASE) 50 MCG/ACT nasal spray Place 2 sprays into the nose as needed.       . furosemide (LASIX) 20 MG tablet Take 20 mg by mouth daily as needed.       [name] LIPITOR 10 MG tablet Take 1 tablet by mouth every evening.       . montelukast (SINGULAIR) 10 MG tablet Take 10 mg by mouth at bedtime.       . NORVASC 5 MG tablet Take 7.5 mg by mouth every evening.       . Omega-3 Fatty Acids (FISH OIL) 1200 MG CAPS Take 1 capsule by mouth daily.       . SYMBICORT 160-4.5 MCG/ACT inhaler 2 PUFFS EVERY 12 HOURS  1 Inhaler  0  . tiotropium (SPIRIVA) 18 MCG inhalation capsule Place 18 mcg into inhaler and inhale every morning.       . traMADol (ULTRAM) 50 MG tablet Take 50 mg by mouth 2 (two) times daily.       [name] losartan (COZAAR) 50 MG tablet Take 75 mg by mouth daily.        Current Facility-Administered Medications  Medication Dose Route Frequency Provider Last Rate Last Dose  . testosterone cypionate (DEPOTESTOTERONE CYPIONATE) injection 200 mg  200 mg Intramuscular Q28 days Ernestina Penna, MD   200  mg at 07/09/12 1152    Allergies  Allergen Reactions  . Aspirin Anaphylaxis, Shortness Of Breath and Swelling  . Nsaids Anaphylaxis, Shortness Of Breath and Swelling  . Benicar (Olmesartan Medoxomil)     dizzy  . Salicylates     SOB  . Ampicillin Rash    Patient Active Problem List   Diagnosis Date Noted  . DOE (dyspnea on exertion) 08/02/2012  . Hypertension 12/15/2010  . Dyslipidemia 12/15/2010  . Chronic lymphoblastic leukemia 12/15/2010  . COPD GOLD III 08/30/2010    History  Smoking status  . Former Smoker -- 1.00 packs/day for 25 years  . Types: Cigarettes  . Quit date: 03/06/1958  Smokeless tobacco  . Former Neurosurgeon  . Quit date: 10/25/1978    History  Alcohol Use  . Yes    Comment: very rare    Family History  Problem Relation Age of Onset  . Emphysema Father   . Heart disease Father   . COPD Father   . Atrial fibrillation Father    . Kidney cancer Mother   . Kidney failure Mother     Review of Systems: Constitutional: no fever chills diaphoresis or fatigue or change in weight.  Head and neck: no hearing loss, no epistaxis, no photophobia or visual disturbance. Respiratory: No cough, shortness of breath or wheezing. Cardiovascular: No chest pain peripheral edema, palpitations. Gastrointestinal: No abdominal distention, no abdominal pain, no change in bowel habits hematochezia or melena. Genitourinary: No dysuria, no frequency, no urgency, no nocturia. Musculoskeletal:No arthralgias, no back pain, no gait disturbance or myalgias. Neurological: No dizziness, no headaches, no numbness, no seizures, no syncope, no weakness, no tremors. Hematologic: No lymphadenopathy, no easy bruising. Psychiatric: No confusion, no hallucinations, no sleep disturbance.    Physical Exam: Filed Vitals:   08/02/12 1151  BP: 142/72  Pulse: 52   the general appearance reveals a fit elderly gentleman in no distress.The head and neck exam reveals pupils equal and reactive.  Extraocular movements are full.  There is no scleral icterus.  The mouth and pharynx are normal.  The neck is supple.  The carotids reveal no bruits.  The jugular venous pressure is normal.  The  thyroid is not enlarged.  There is no lymphadenopathy.  The chest is clear to percussion and auscultation.  There are no rales or rhonchi.  Expansion of the chest is symmetrical.  The precordium is quiet.  The first heart sound is normal.  The second heart sound is physiologically split.  There is no murmur gallop rub or click.  There is no abnormal lift or heave.  The abdomen is soft and nontender.  The bowel sounds are normal.  The liver and spleen are not enlarged.  There are no abdominal masses.  There are no abdominal bruits.  Extremities reveal good pedal pulses.  There is no phlebitis or edema.  There is no cyanosis or clubbing.  Strength is normal and symmetrical in all  extremities.  There is no lateralizing weakness.  There are no sensory deficits.  The skin is warm and dry.  There is no rash.  EKG shows sinus bradycardia at 52 per minute with first degree AV block and a PR interval of 230 ms there are nonspecific T-wave flattening in lateral leads.  Since last EKG 07/12/11, frequent PVCs have resolved   Assessment / Plan: Patient is to continue same medication.  He will return for a walking Lexa scan Myoview.  He will leave off his dipyridamole for  48 hours prior to the study and he will leave off his carvedilol the morning of the study. Recheck in 6 months for followup office visit or sooner if symptoms recur

## 2012-08-02 NOTE — Assessment & Plan Note (Signed)
The patient has a history of dyslipidemia.  He is on Lipitor 10 mg daily.  He is not having any myalgias.

## 2012-08-02 NOTE — Assessment & Plan Note (Signed)
Patient had episodes of dizziness last fall and again in February 2014.  No recent symptoms.  His electrocardiogram today shows no arrhythmia.  We will proceed with ischemic testing.  We can keep in mind an event monitor if dizziness recurs.

## 2012-08-09 ENCOUNTER — Ambulatory Visit (INDEPENDENT_AMBULATORY_CARE_PROVIDER_SITE_OTHER): Payer: Medicare Other | Admitting: Family Medicine

## 2012-08-09 DIAGNOSIS — E291 Testicular hypofunction: Secondary | ICD-10-CM

## 2012-08-13 ENCOUNTER — Encounter (HOSPITAL_COMMUNITY): Payer: Medicare Other

## 2012-08-15 ENCOUNTER — Ambulatory Visit (HOSPITAL_COMMUNITY): Payer: Medicare Other | Attending: Cardiology | Admitting: Radiology

## 2012-08-15 VITALS — BP 188/74 | HR 65 | Ht 68.0 in | Wt 168.0 lb

## 2012-08-15 DIAGNOSIS — Z9221 Personal history of antineoplastic chemotherapy: Secondary | ICD-10-CM | POA: Insufficient documentation

## 2012-08-15 DIAGNOSIS — R0602 Shortness of breath: Secondary | ICD-10-CM

## 2012-08-15 DIAGNOSIS — E785 Hyperlipidemia, unspecified: Secondary | ICD-10-CM | POA: Insufficient documentation

## 2012-08-15 DIAGNOSIS — R42 Dizziness and giddiness: Secondary | ICD-10-CM | POA: Insufficient documentation

## 2012-08-15 DIAGNOSIS — Z8673 Personal history of transient ischemic attack (TIA), and cerebral infarction without residual deficits: Secondary | ICD-10-CM | POA: Insufficient documentation

## 2012-08-15 DIAGNOSIS — Z8249 Family history of ischemic heart disease and other diseases of the circulatory system: Secondary | ICD-10-CM | POA: Insufficient documentation

## 2012-08-15 DIAGNOSIS — R0989 Other specified symptoms and signs involving the circulatory and respiratory systems: Secondary | ICD-10-CM | POA: Insufficient documentation

## 2012-08-15 DIAGNOSIS — R0609 Other forms of dyspnea: Secondary | ICD-10-CM | POA: Insufficient documentation

## 2012-08-15 DIAGNOSIS — I10 Essential (primary) hypertension: Secondary | ICD-10-CM | POA: Insufficient documentation

## 2012-08-15 DIAGNOSIS — Z87891 Personal history of nicotine dependence: Secondary | ICD-10-CM | POA: Insufficient documentation

## 2012-08-15 MED ORDER — TECHNETIUM TC 99M SESTAMIBI GENERIC - CARDIOLITE
10.0000 | Freq: Once | INTRAVENOUS | Status: AC | PRN
  Administered 2012-08-15: 10 via INTRAVENOUS

## 2012-08-15 MED ORDER — REGADENOSON 0.4 MG/5ML IV SOLN
0.4000 mg | Freq: Once | INTRAVENOUS | Status: AC
  Administered 2012-08-15: 0.4 mg via INTRAVENOUS

## 2012-08-15 MED ORDER — TECHNETIUM TC 99M SESTAMIBI GENERIC - CARDIOLITE
30.0000 | Freq: Once | INTRAVENOUS | Status: AC | PRN
  Administered 2012-08-15: 30 via INTRAVENOUS

## 2012-08-15 NOTE — Progress Notes (Signed)
[facility] SITE 3 NUCLEAR MED [address] [place], [place] [address] [phone]    Cardiology Nuclear Med Study  Johnathan Strong is a 79 y.o. male     MRN : [id number]     DOB: 02-Mar-1924  Procedure Date: 08/15/2012  Nuclear Med Background Indication for Stress Test:  Evaluation for Ischemia History:  h/o Chemo; '11 MPS:no ischemia, EF=73%; '12 Echo:EF=65% Cardiac Risk Factors: Family History - CAD, History of Smoking, Hypertension, Lipids and TIA  Symptoms: Exertional Dizziness and DOE   Nuclear Pre-Procedure Caffeine/Decaff Intake:  None NPO After: 7:00am   Lungs:  Clear. O2 Sat: 96% on room air. IV 0.9% NS with Angio Cath:  22g  IV Site: R Hand  IV Started by:  Bonnita Levan, RN  Chest Size (in):  44 Cup Size: n/a  Height: 5\' 8"  (1.727 m)  Weight:  168 lb (76.204 kg)  BMI:  Body mass index is 25.55 kg/(m^2). Tech Comments:  Persantine held x 72 hours, Coreg held this am    Nuclear Med Study 1 or 2 day study: 1 day  Stress Test Type:  Eugenie Birks  Reading MD: Marca Ancona, MD  Order Authorizing Provider:  Cassell Clement, MD  Resting Radionuclide: Technetium 81m Sestamibi  Resting Radionuclide Dose: 10.8 mCi   Stress Radionuclide:  Technetium 86m Sestamibi  Stress Radionuclide Dose: 33.0 mCi           Stress Protocol Rest HR: 65 Stress HR: 100  Rest BP: 188/74 Stress BP: 166/60  Exercise Time (min): 2:00 METS: n/a   Predicted Max HR: 133 bpm % Max HR: 75.19 bpm Rate Pressure Product: 29528   Dose of Adenosine (mg):  n/a Dose of Lexiscan: 0.4 mg  Dose of Atropine (mg): n/a Dose of Dobutamine: n/a mcg/kg/min (at max HR)  Stress Test Technologist: Smiley Houseman, CMA-N  Nuclear Technologist:  Doyne Keel, CNMT     Rest Procedure:  Myocardial perfusion imaging was performed at rest 45 minutes following the intravenous administration of Technetium 29m Sestamibi.  Rest ECG: NSR with non-specific ST-T wave changes  Stress Procedure:  The patient  received IV Lexiscan 0.4 mg over 15-seconds with concurrent low level exercise and then Technetium 89m Sestamibi was injected at 30-seconds while the patient continued walking one more minute.  Quantitative spect images were obtained after a 45-minute delay.  Stress ECG: Borderline significant ST depression inferior leads and V4-V6.   QPS Raw Data Images:  Normal; no motion artifact; normal heart/lung ratio. Stress Images:  Medium-sized, severe basal to mid inferior and inferoseptal perfusion defect.  Rest Images:  Medium-sized, moderate basal to mid inferior and inferoseptal perfusion defect.  Subtraction (SDS):  Partially reversible basal to mid inferior and inferoseptal perfusion defect.  Transient Ischemic Dilatation (Normal <1.22):  0.93 Lung/Heart Ratio (Normal <0.45):  0.23  Quantitative Gated Spect Images QGS EDV:  84 ml QGS ESV:  27 ml  Impression Exercise Capacity:  Lexiscan with low level exercise. BP Response:  Normal blood pressure response. Clinical Symptoms:  Short of breath ECG Impression:  Baseline slight ST depression inferior leads and V5/V6.  1 mm ST depression inferior leads and V4-V6 with infusion.  Comparison with Prior Nuclear Study: No ischemia was noted on the prior study.   Overall Impression:  Intermediate risk stress nuclear study with a medium-sized, partially reversible basal to mid inferior and inferoseptal perfusion defect.  This is suggestive of infarction with significant peri-infarction ischemia. . There are borderline ST changes on stress ECG.  LV Ejection Fraction: 68%.  LV Wall Motion:  NL LV Function; NL Wall Motion  Marca Ancona 08/15/2012

## 2012-08-16 ENCOUNTER — Telehealth: Payer: Self-pay | Admitting: *Deleted

## 2012-08-16 MED ORDER — NITROGLYCERIN 0.4 MG SL SUBL: 0.4000 mg | SUBLINGUAL_TABLET | SUBLINGUAL | Status: DC | PRN

## 2012-08-16 NOTE — Telephone Encounter (Signed)
Spoke with  Dr. Patty Sermons and will order NTG SL secondary to recent myoview, sent to pharmacy

## 2012-08-19 ENCOUNTER — Telehealth: Payer: Self-pay | Admitting: *Deleted

## 2012-08-19 ENCOUNTER — Encounter: Payer: Self-pay | Admitting: *Deleted

## 2012-08-19 ENCOUNTER — Other Ambulatory Visit: Payer: Self-pay | Admitting: Cardiology

## 2012-08-19 ENCOUNTER — Other Ambulatory Visit: Payer: Self-pay | Admitting: *Deleted

## 2012-08-19 ENCOUNTER — Telehealth: Payer: Self-pay | Admitting: Cardiology

## 2012-08-19 DIAGNOSIS — Z888 Allergy status to other drugs, medicaments and biological substances status: Secondary | ICD-10-CM

## 2012-08-19 DIAGNOSIS — I998 Other disorder of circulatory system: Secondary | ICD-10-CM

## 2012-08-19 DIAGNOSIS — R931 Abnormal findings on diagnostic imaging of heart and coronary circulation: Secondary | ICD-10-CM

## 2012-08-19 DIAGNOSIS — I259 Chronic ischemic heart disease, unspecified: Secondary | ICD-10-CM

## 2012-08-19 DIAGNOSIS — R42 Dizziness and giddiness: Secondary | ICD-10-CM

## 2012-08-19 DIAGNOSIS — Z01810 Encounter for preprocedural cardiovascular examination: Secondary | ICD-10-CM

## 2012-08-19 NOTE — Telephone Encounter (Signed)
I have attempted contact x 2, asked pt to call back to make arrangements for further testing, pt it aware he  needs a heart cath. After review of schedule I will see if 6/24 will be ok for him, labs 6/20. I asked pt to call back and ask for me.

## 2012-08-19 NOTE — Telephone Encounter (Signed)
This patient had a recent Lexi scan Myoview stress test which showed reversible inferior wall ischemia.  He has not been experiencing chest pain but has had exertional dyspnea and has had occasional dizzy spells.  I have recommended that he have a cardiac catheterization with possible PCI and the patient is agreeable.  The patient is having a cystoscopy for recurrent bladder tumors this week.  We will try to set up his cardiac catheterization for next week.  I anticipate that he will need an intervention so we will set him up in the main lab.

## 2012-08-19 NOTE — Telephone Encounter (Signed)
Pt to have LHC 6/24, labs 6/20, lab will place orders/ unable to place order for pt/inr, DX not accepted.

## 2012-08-19 NOTE — Progress Notes (Signed)
Pt/inr order placed

## 2012-08-20 ENCOUNTER — Telehealth: Payer: Self-pay | Admitting: *Deleted

## 2012-08-20 NOTE — Telephone Encounter (Signed)
Message copied by Antony Odea on Tue Aug 20, 2012  8:36 AM ------      Message from: Cassell Clement      Created: Mon Aug 19, 2012  6:01 PM       Suggest he stop his persantine before LHC. Take last dose on 6/22.      ----- Message -----         From: Antony Odea, RN         Sent: 08/19/2012   3:53 PM           To: Cassell Clement, MD, Antony Odea, RN            Pt on Persantin and wants to know if he should continue for LHC?      Plus will need orders and has asa allergy. Labs 6/20 lhc 6/24 w/ [place].      Jodette        ------

## 2012-08-20 NOTE — Telephone Encounter (Signed)
done

## 2012-08-20 NOTE — Telephone Encounter (Signed)
Pt aware to hold persantine, last dose Sunday.

## 2012-08-22 ENCOUNTER — Encounter (HOSPITAL_COMMUNITY): Payer: Self-pay | Admitting: Respiratory Therapy

## 2012-08-23 ENCOUNTER — Other Ambulatory Visit: Payer: Medicare Other

## 2012-08-23 ENCOUNTER — Ambulatory Visit (INDEPENDENT_AMBULATORY_CARE_PROVIDER_SITE_OTHER): Payer: Medicare Other | Admitting: *Deleted

## 2012-08-23 DIAGNOSIS — I119 Hypertensive heart disease without heart failure: Secondary | ICD-10-CM

## 2012-08-23 DIAGNOSIS — Z888 Allergy status to other drugs, medicaments and biological substances status: Secondary | ICD-10-CM

## 2012-08-23 DIAGNOSIS — I998 Other disorder of circulatory system: Secondary | ICD-10-CM

## 2012-08-23 LAB — BASIC METABOLIC PANEL
Chloride: 102 mEq/L (ref 96–112)
GFR: 64.51 mL/min (ref >60.00)
Glucose, Bld: 90 mg/dL (ref 70–99)
Potassium: 4.4 mEq/L (ref 3.5–5.1)
Sodium: 137 mEq/L (ref 135–145)

## 2012-08-23 LAB — CBC WITH DIFFERENTIAL/PLATELET
Eosinophils Relative: 1.1 % (ref 0.0–5.0)
Lymphocytes Relative: 58.4 % — ABNORMAL HIGH (ref 12.0–46.0)
MCV: 98.8 fl (ref 78.0–100.0)
Monocytes Absolute: 0.8 10*3/uL (ref 0.1–1.0)
Neutrophils Relative %: 34.1 % — ABNORMAL LOW (ref 43.0–77.0)
Platelets: 177 10*3/uL (ref 150.0–400.0)
WBC: 13.9 10*3/uL — ABNORMAL HIGH (ref 4.5–10.5)

## 2012-08-23 LAB — PROTIME-INR: Prothrombin Time: 12.1 s (ref 10.2–12.4)

## 2012-08-27 ENCOUNTER — Encounter (HOSPITAL_COMMUNITY)
Admission: RE | Disposition: A | Discharge status: Home / Self Care | Payer: Self-pay | Source: Ambulatory Visit | Attending: Cardiology

## 2012-08-27 ENCOUNTER — Ambulatory Visit (HOSPITAL_COMMUNITY)
Admission: RE | Admit: 2012-08-27 | Discharge: 2012-08-27 | Disposition: A | Discharge status: Home / Self Care | Payer: Medicare Other | Source: Ambulatory Visit | Attending: Cardiology | Admitting: Cardiology

## 2012-08-27 ENCOUNTER — Encounter (HOSPITAL_COMMUNITY): Payer: Self-pay | Admitting: Physician Assistant

## 2012-08-27 DIAGNOSIS — E785 Hyperlipidemia, unspecified: Secondary | ICD-10-CM | POA: Insufficient documentation

## 2012-08-27 DIAGNOSIS — I2582 Chronic total occlusion of coronary artery: Secondary | ICD-10-CM | POA: Insufficient documentation

## 2012-08-27 DIAGNOSIS — C9110 Chronic lymphocytic leukemia of B-cell type not having achieved remission: Secondary | ICD-10-CM | POA: Insufficient documentation

## 2012-08-27 DIAGNOSIS — E7800 Pure hypercholesterolemia, unspecified: Secondary | ICD-10-CM | POA: Insufficient documentation

## 2012-08-27 DIAGNOSIS — Z8551 Personal history of malignant neoplasm of bladder: Secondary | ICD-10-CM | POA: Insufficient documentation

## 2012-08-27 DIAGNOSIS — M899 Disorder of bone, unspecified: Secondary | ICD-10-CM | POA: Insufficient documentation

## 2012-08-27 DIAGNOSIS — Z888 Allergy status to other drugs, medicaments and biological substances status: Secondary | ICD-10-CM | POA: Insufficient documentation

## 2012-08-27 DIAGNOSIS — I2510 Atherosclerotic heart disease of native coronary artery without angina pectoris: Secondary | ICD-10-CM | POA: Insufficient documentation

## 2012-08-27 DIAGNOSIS — Z8673 Personal history of transient ischemic attack (TIA), and cerebral infarction without residual deficits: Secondary | ICD-10-CM | POA: Insufficient documentation

## 2012-08-27 DIAGNOSIS — N400 Enlarged prostate without lower urinary tract symptoms: Secondary | ICD-10-CM | POA: Insufficient documentation

## 2012-08-27 DIAGNOSIS — IMO0002 Reserved for concepts with insufficient information to code with codable children: Secondary | ICD-10-CM | POA: Insufficient documentation

## 2012-08-27 DIAGNOSIS — J449 Chronic obstructive pulmonary disease, unspecified: Secondary | ICD-10-CM | POA: Insufficient documentation

## 2012-08-27 DIAGNOSIS — M1990 Unspecified osteoarthritis, unspecified site: Secondary | ICD-10-CM | POA: Insufficient documentation

## 2012-08-27 DIAGNOSIS — Z87891 Personal history of nicotine dependence: Secondary | ICD-10-CM | POA: Insufficient documentation

## 2012-08-27 DIAGNOSIS — Z886 Allergy status to analgesic agent status: Secondary | ICD-10-CM | POA: Insufficient documentation

## 2012-08-27 DIAGNOSIS — Z880 Allergy status to penicillin: Secondary | ICD-10-CM | POA: Insufficient documentation

## 2012-08-27 DIAGNOSIS — J4489 Other specified chronic obstructive pulmonary disease: Secondary | ICD-10-CM | POA: Insufficient documentation

## 2012-08-27 DIAGNOSIS — I259 Chronic ischemic heart disease, unspecified: Secondary | ICD-10-CM

## 2012-08-27 DIAGNOSIS — Z79899 Other long term (current) drug therapy: Secondary | ICD-10-CM | POA: Insufficient documentation

## 2012-08-27 HISTORY — PX: LEFT HEART CATHETERIZATION WITH CORONARY ANGIOGRAM: SHX5451

## 2012-08-27 HISTORY — DX: Allergy status to other drugs, medicaments and biological substances: Z88.8

## 2012-08-27 HISTORY — PX: CARDIAC CATHETERIZATION: SHX172

## 2012-08-27 SURGERY — LEFT HEART CATHETERIZATION WITH CORONARY ANGIOGRAM
Anesthesia: LOCAL

## 2012-08-27 MED ORDER — LOSARTAN POTASSIUM 50 MG PO TABS: 75.0000 mg | ORAL_TABLET | Freq: Every day | ORAL | Status: DC

## 2012-08-27 MED ORDER — VERAPAMIL HCL 2.5 MG/ML IV SOLN
INTRAVENOUS | Status: AC
  Filled 2012-08-27: qty 2

## 2012-08-27 MED ORDER — HEPARIN SODIUM (PORCINE) 1000 UNIT/ML IJ SOLN
INTRAMUSCULAR | Status: AC
  Filled 2012-08-27: qty 1

## 2012-08-27 MED ORDER — DIAZEPAM 5 MG PO TABS
ORAL_TABLET | ORAL | Status: AC
  Filled 2012-08-27: qty 1

## 2012-08-27 MED ORDER — SODIUM CHLORIDE 0.9 % IV SOLN
INTRAVENOUS | Status: DC
  Administered 2012-08-27: 07:00:00 via INTRAVENOUS

## 2012-08-27 MED ORDER — MIDAZOLAM HCL 2 MG/2ML IJ SOLN
INTRAMUSCULAR | Status: AC
  Filled 2012-08-27: qty 2

## 2012-08-27 MED ORDER — SODIUM CHLORIDE 0.9 % IJ SOLN: 3.0000 mL | Freq: Two times a day (BID) | INTRAMUSCULAR | Status: DC

## 2012-08-27 MED ORDER — NITROGLYCERIN 0.2 MG/ML ON CALL CATH LAB
INTRAVENOUS | Status: AC
  Filled 2012-08-27: qty 1

## 2012-08-27 MED ORDER — HEPARIN (PORCINE) IN NACL 2-0.9 UNIT/ML-% IJ SOLN
INTRAMUSCULAR | Status: AC
  Filled 2012-08-27: qty 1000

## 2012-08-27 MED ORDER — SODIUM CHLORIDE 0.9 % IJ SOLN
3.0000 mL | INTRAMUSCULAR | Status: DC | PRN
Start: 2012-08-27 — End: 2012-08-27

## 2012-08-27 MED ORDER — DIAZEPAM 5 MG PO TABS
5.0000 mg | ORAL_TABLET | ORAL | Status: AC
  Administered 2012-08-27: 5 mg via ORAL

## 2012-08-27 MED ORDER — LIDOCAINE HCL (PF) 1 % IJ SOLN
INTRAMUSCULAR | Status: AC
  Filled 2012-08-27: qty 30

## 2012-08-27 MED ORDER — SODIUM CHLORIDE 0.9 % IV SOLN: 250.0000 mL | INTRAVENOUS | Status: DC | PRN

## 2012-08-27 MED ORDER — SODIUM CHLORIDE 0.9 % IV SOLN: 1.0000 mL/kg/h | INTRAVENOUS | Status: DC

## 2012-08-27 NOTE — Interval H&P Note (Signed)
History and Physical Interval Note:  08/27/2012 7:56 AM  Johnathan Strong  has presented today for surgery, with the diagnosis of Chest pain  The various methods of treatment have been discussed with the patient and family. After consideration of risks, benefits and other options for treatment, the patient has consented to  Procedure(s): LEFT HEART CATHETERIZATION WITH CORONARY ANGIOGRAM (N/A) as a surgical intervention .  The patient's history has been reviewed, patient examined, no change in status, stable for surgery.  I have reviewed the patient's chart and labs.  Questions were answered to the patient's satisfaction.     Theron Arista [facility] 08/27/2012 7:56 AM

## 2012-08-27 NOTE — H&P (Signed)
History and Physical   Patient ID: Johnathan Strong MRN: [id number], DOB/AGE: 10-30-1923   Admit date: 08/27/2012 Date of Consult: 08/27/2012   Primary Physician: Rudi Heap, MD Primary Cardiologist: Wylene Simmer, MD  HPI: Johnathan Strong is a 79 y.o. male with PMHx s/f COPD, HLD, h/o bladder CA (s/p bladder tumor resection, urologist Dr. Vernie Ammons, periodically monitored with follow-up cystoscopies), ASA allergy (eyes swell), CLL, h/o TIA and BPH who presents for diagnostic cardiac cath today.   He was seen in follow-up by Dr. Connye Burkitt 08/02/12 after an extended absence. He has no prior history of CAD (CV studies below). He had complained of exertional dyspnea and dizziness, particularly on exertion (raking leaves, using gym equipment). He does have COPD which is followed by Dr. Sherene Sires. He has been started on Spiriva and Symbicort; however, was concerned that his DOE may also represent underlying CAD as a close friend had recently underwent PCI. He was schedueled for Lexiscan Myoview performed 08/15/12 which was intermediate risk- mediuim-sized partially reversible basal to mid inferior and inferoseptal perfusion defect c/w with prior infarction and peri-infarct ischemia; EF 68% and borderline ST/T changes on stress ECG. The findings were discussed with the patient, and recommendation was made to proceed with diagnostic cardiac catheterization. The information, risks and benefits of the procedure were discussed with the patient who agreed to proceed.   Of note, he does have a scheduled follow-up cystoscopy on 09/03/12 for bladder CA monitoring.   He presents today for this procedure. He feels well with no complaints.   Problem List: Past Medical History  Diagnosis Date  . Osteopenia 2006  . Hyperlipidemia   . BPH (benign prostatic hypertrophy)   . Transient global amnesia   . Osteoarthritis   . History of TIA (transient ischemic attack) 1980'S    NO RESIDUAL  . COPD (chronic obstructive  pulmonary disease) with emphysema   . Dyspnea on exertion   . Chronic back pain   . Itching SECONDARY TO CLL-- CONTROLLED W/ SINGULAIR  . Frequency   . Nocturia   . Hypertension CARDIOLOGIST- DR BRACKBILL-- LAST VISIT 12-15-2010 NOTE IN EPIC  . CLL (chronic lymphocytic leukemia) LAST PLT COUNT APRL 2012  168    Managed at [facility]  BY DR [facility]  . Bladder cancer   . Aspirin allergy     Eyes swell    Past Surgical History  Procedure Laterality Date  . Hemorroidectomy  1980  . Cardiovascular stress test      a. 07/2005- no reversible ischemia, normal EF b. 06/2009- EF 73%, no reversible ischemia, inferolateral TWIs at rest, upright in recovery c. 08/2012- mediuim-sized partially reversible basal to mid inferior and inferoseptal perfusion defect c/w with prior infarction and peri-infarct ischemia; EF 68% and borderline ST/T changes on stress ECG   . Transurethral resection of bladder tumor  10-19-2008    AND DILATION URETHRAL STRICTURE  . Cataract extraction w/ intraocular lens  implant, bilateral    . Transthoracic echocardiogram  01-03-2011    LVEF 60-65%, grade 1 diastolic dysfunction, no WMAs or structural abnormalities  . Cystoscopy with biopsy  10/30/2011    Procedure: CYSTOSCOPY WITH BIOPSY;  Surgeon: Garnett Farm, MD;  Location: [facility];  Service: Urology;  Laterality: N/A;     Allergies:  Allergies  Allergen Reactions  . Aspirin Anaphylaxis, Shortness Of Breath and Swelling  . Nsaids Anaphylaxis, Shortness Of Breath and Swelling  . Benicar (Olmesartan Medoxomil)     dizzy  .  Salicylates     SOB  . Ampicillin Rash    Home Medications: Prior to Admission medications   Medication Sig Start Date End Date Taking? Authorizing Provider  albuterol (VENTOLIN HFA) 108 (90 BASE) MCG/ACT inhaler Inhale 2 puffs into the lungs every 6 (six) hours as needed for wheezing.    Yes Historical Provider, MD  amLODipine (NORVASC) 5 MG tablet Take 7.5 mg by mouth  daily.   Yes Historical Provider, MD  atorvastatin (LIPITOR) 10 MG tablet Take 10 mg by mouth daily.   Yes Historical Provider, MD  Azelastine HCl (ASTELIN NA) Place 1 spray into the nose as needed (for congestion).    Yes Historical Provider, MD  budesonide-formoterol (SYMBICORT) 160-4.5 MCG/ACT inhaler Inhale 2 puffs into the lungs 2 (two) times daily.   Yes Historical Provider, MD  carvedilol (COREG) 12.5 MG tablet Take 1 tablet by mouth Twice daily. 08/03/10  Yes Historical Provider, MD  Cholecalciferol (VITAMIN D3) 1000 UNITS CAPS Take 1 capsule by mouth daily.    Yes Historical Provider, MD  diphenhydramine-acetaminophen (TYLENOL PM) 25-500 MG TABS Take 1 tablet by mouth at bedtime as needed.    Yes Historical Provider, MD  dipyridamole (PERSANTINE) 50 MG tablet Take 50 mg by mouth Twice daily.  07/12/10  Yes Historical Provider, MD  fluticasone (FLONASE) 50 MCG/ACT nasal spray Place 2 sprays into the nose as needed.  06/21/10  Yes Historical Provider, MD  losartan (COZAAR) 50 MG tablet Take 75 mg by mouth daily.  05/17/11 08/22/12 Yes Cassell Clement, MD  montelukast (SINGULAIR) 10 MG tablet Take 10 mg by mouth at bedtime.    Yes Historical Provider, MD  nitroGLYCERIN (NITROSTAT) 0.4 MG SL tablet Place 1 tablet (0.4 mg total) under the tongue every 5 (five) minutes as needed for chest pain. 08/16/12  Yes Cassell Clement, MD  Omega-3 Fatty Acids (FISH OIL) 1200 MG CAPS Take 1 capsule by mouth daily.    Yes Historical Provider, MD  testosterone cypionate (DEPOTESTOTERONE CYPIONATE) 100 MG/ML injection Inject 200 mg into the muscle every 28 (twenty-eight) days. For IM use only   Yes Historical Provider, MD  tiotropium (SPIRIVA) 18 MCG inhalation capsule Place 18 mcg into inhaler and inhale every morning.    Yes Historical Provider, MD  traMADol (ULTRAM) 50 MG tablet Take 50 mg by mouth 2 (two) times daily.    Yes Historical Provider, MD  furosemide (LASIX) 20 MG tablet Take 20 mg by mouth daily as  needed.     Historical Provider, MD    Inpatient Medications:  . sodium chloride  3 mL Intravenous Q12H   Facility-administered medications prior to admission  Medication Dose Route Frequency Provider Last Rate Last Dose  . testosterone cypionate (DEPOTESTOTERONE CYPIONATE) injection 200 mg  200 mg Intramuscular Q28 days Ernestina Penna, MD   200 mg at 08/09/12 1146   Prescriptions prior to admission  Medication Sig Dispense Refill  . albuterol (VENTOLIN HFA) 108 (90 BASE) MCG/ACT inhaler Inhale 2 puffs into the lungs every 6 (six) hours as needed for wheezing.       [name] amLODipine (NORVASC) 5 MG tablet Take 7.5 mg by mouth daily.      [name] atorvastatin (LIPITOR) 10 MG tablet Take 10 mg by mouth daily.      . Azelastine HCl (ASTELIN NA) Place 1 spray into the nose as needed (for congestion).       . budesonide-formoterol (SYMBICORT) 160-4.5 MCG/ACT inhaler Inhale 2 puffs into the lungs 2 (two) times  daily.      . carvedilol (COREG) 12.5 MG tablet Take 1 tablet by mouth Twice daily.      . Cholecalciferol (VITAMIN D3) 1000 UNITS CAPS Take 1 capsule by mouth daily.       . diphenhydramine-acetaminophen (TYLENOL PM) 25-500 MG TABS Take 1 tablet by mouth at bedtime as needed.       . dipyridamole (PERSANTINE) 50 MG tablet Take 50 mg by mouth Twice daily.       . fluticasone (FLONASE) 50 MCG/ACT nasal spray Place 2 sprays into the nose as needed.       [name] losartan (COZAAR) 50 MG tablet Take 75 mg by mouth daily.       . montelukast (SINGULAIR) 10 MG tablet Take 10 mg by mouth at bedtime.       . nitroGLYCERIN (NITROSTAT) 0.4 MG SL tablet Place 1 tablet (0.4 mg total) under the tongue every 5 (five) minutes as needed for chest pain.  25 tablet  prn  . Omega-3 Fatty Acids (FISH OIL) 1200 MG CAPS Take 1 capsule by mouth daily.       [name] testosterone cypionate (DEPOTESTOTERONE CYPIONATE) 100 MG/ML injection Inject 200 mg into the muscle every 28 (twenty-eight) days. For IM use only      . tiotropium  (SPIRIVA) 18 MCG inhalation capsule Place 18 mcg into inhaler and inhale every morning.       . traMADol (ULTRAM) 50 MG tablet Take 50 mg by mouth 2 (two) times daily.       . furosemide (LASIX) 20 MG tablet Take 20 mg by mouth daily as needed.         Family History  Problem Relation Age of Onset  . Emphysema Father   . Heart disease Father   . COPD Father   . Atrial fibrillation Father   . Kidney cancer Mother   . Kidney failure Mother      History   Social History  . Marital Status: Married    Spouse Name: N/A    Number of Children: N/A  . Years of Education: N/A   Occupational History  . retired Engineer, maintenance (IT)    Social History Main Topics  . Smoking status: Former Smoker -- 1.00 packs/day for 25 years    Types: Cigarettes    Quit date: 03/06/1958  . Smokeless tobacco: Former Neurosurgeon    Quit date: 10/25/1978  . Alcohol Use: Yes     Comment: very rare  . Drug Use: No  . Sexually Active: Not on file   Other Topics Concern  . Not on file   Social History Narrative  . No narrative on file     Review of Systems: General: negative for chills, fever, night sweats or weight changes.  Cardiovascular: positive for dyspnea on exertion, negative for chest pain, edema, orthopnea, palpitations, paroxysmal nocturnal dyspnea or shortness of breath Dermatological: negative for rash Respiratory: negative for cough or wheezing Urologic: positive for increased frequency, negative for hematuria Abdominal: negative for nausea, vomiting, diarrhea, bright red blood per rectum, melena, or hematemesis Neurologic: positive for dizziness, negative for visual changes, syncope All other systems reviewed and are otherwise negative except as noted above.  Physical Exam: Blood pressure 174/61, pulse 62, temperature 97.5 F (36.4 C), temperature source Oral, resp. rate 18, height 5\' 9"  (1.753 m), weight 77.111 kg (170 lb), SpO2 100.00%.    General: Elderly, well developed, well  nourished, in no acute distress. Head: Normocephalic, atraumatic, sclera non-icteric,  no xanthomas, nares are without discharge.  Neck: Negative for carotid bruits. JVD not elevated. Lungs:  Distant breath sounds, clear bilaterally to auscultation without wheezes, rales, or rhonchi. Breathing is unlabored. Heart: RRR with S1 S2. No murmurs, rubs, or gallops appreciated. Abdomen: Soft, non-tender, non-distended with normoactive bowel sounds. No hepatomegaly. No rebound/guarding. No obvious abdominal masses. Msk:  Strength and tone appears normal for age. Extremities: No clubbing, cyanosis or edema.  Distal pedal pulses are 2+ and equal bilaterally. Neuro: Alert and oriented X 3. Moves all extremities spontaneously. Psych:  Responds to questions appropriately with a normal affect.  Labs:  Recent Labs Lab 08/23/12 1025  NA 137  K 4.4  CL 102  CO2 31  BUN 17  CREATININE 1.1  CALCIUM 9.3  GLUCOSE 90   Radiology/Studies: No results found.  EKG 08/02/12: Sinus bradycardia, 1st degree AVB, no ST/T changes  ASSESSMENT AND PLAN:   79 y.o. male with PMHx s/f COPD, HLD, h/o bladder CA (s/p bladder tumor resection, urologist Dr. Vernie Ammons, periodically monitored with follow-up cystoscopies), ASA allergy (eyes swell), CLL, h/o TIA (on dipyridamole) and BPH who presents for diagnostic cardiac cath today.   1. Abnormal stress test  2. Dyspnea on exertion 3. Dizziness 4. COPD 5. ASA allergy 6. HLD 7. H/o bladder CA 8. H/o CLL 9. H/o TIA  10. BPH  Plan for diagnostic cardiac catheterization today. The patients questions were answered. He is asymptomatic without complaints today. ASA allergy and follow-up cystoscopy relayed to interventionalist Dr. [place] as this will affect PCI if applicable (antiplatelet selection +/- BMS?). Further recommendations per interventionalists findings.   Signed, R. Hurman Horn, PA-C 08/27/2012, 7:47 AM   Patient seen and examined and history reviewed. Agree  with above findings and plan. 79 yo WM with symptoms of dyspnea. Class 2. On beta blocker therapy. Stress myoview is intermediate risk with inferior scar and peri-infarct ischemia. Will proceed with cardiac cath.  Theron Arista [facility] 08/27/2012 7:54 AM

## 2012-08-27 NOTE — CV Procedure (Signed)
   Cardiac Catheterization Procedure Note  Name: Johnathan Strong MRN: [id number] DOB: 1923/12/07  Procedure: Left Heart Cath, Selective Coronary Angiography, LV angiography  Indication: 79 year old white male with history of COPD Gold stage III, CLL, bladder cancer, and history of TIA who presents with symptoms of exertional dyspnea. Stress Myoview study demonstrated a large inferior and inferior septal perfusion abnormality with partial reversibility. Ejection fraction was 68%. The patient is aspirin allergic.    Procedural Details: The right wrist was prepped, draped, and anesthetized with 1% lidocaine. Using the modified Seldinger technique, a 5 French sheath was introduced into the right radial artery. 3 mg of verapamil was administered through the sheath, weight-based unfractionated heparin was administered intravenously. Standard Judkins catheters were used for selective coronary angiography and left ventriculography. The right coronary was difficult to engage. With an ERAD right coronary catheter we were able to obtain flush shots of the ostium. Catheter exchanges were performed over an exchange length guidewire. There were no immediate procedural complications. A TR band was used for radial hemostasis at the completion of the procedure.  The patient was transferred to the post catheterization recovery area for further monitoring.  Procedural Findings: Hemodynamics: AO 135/52 with a mean of 86 mmHg LV 133/17 mmHg  Coronary angiography: Coronary dominance: right  Left mainstem: The left main coronary is short and without significant disease.  Left anterior descending (LAD): The LAD is moderately calcified in the proximal vessel. There is a focal, complex stenosis in the proximal vessel up to 90%.  Left circumflex (LCx): The left circumflex has mild disease up to 20-30%.  Right coronary artery (RCA): The right coronary is heavily calcified at the ostium and proximal vessel. It is  occluded at the ostium. The right coronary is a dominant vessel that fills entirely by left to right collaterals.  Left ventriculography: Left ventricular systolic function is normal, LVEF is estimated at 55-65%, there is no significant mitral regurgitation   Final Conclusions:   1. Severe two-vessel obstructive coronary disease. There is a high-grade proximal LAD stenosis. The right coronary is occluded at the ostium with excellent collateral flow. 2. Good LV function.  Recommendations: Will discuss options with patient and family. Given his advanced age and multiple medical problems he would appear to be a poor surgical candidate. We could consider PCI of the proximal LAD. The right coronary is not suitable for PCI. We will need to consider optimal antiplatelet therapy since the patient is aspirin allergic.  [name] 08/27/2012, 8:42 AM

## 2012-09-04 ENCOUNTER — Telehealth: Payer: Self-pay

## 2012-09-04 ENCOUNTER — Other Ambulatory Visit: Payer: Self-pay

## 2012-09-04 DIAGNOSIS — Z888 Allergy status to other drugs, medicaments and biological substances status: Secondary | ICD-10-CM

## 2012-09-04 DIAGNOSIS — I2510 Atherosclerotic heart disease of native coronary artery without angina pectoris: Secondary | ICD-10-CM

## 2012-09-04 MED ORDER — CLOPIDOGREL BISULFATE 75 MG PO TABS: ORAL_TABLET | ORAL | Status: DC

## 2012-09-04 NOTE — Telephone Encounter (Signed)
Patient called was told Dr.Jordan advised needs to have lab work P2Y12 done at [facility] lab at [employer] next week before his 09/13/12 appointment.Patient to come by office to pick up lab order. Start plavix 300 mg daily for 1 day then 75 mg daily.Advised to keep appointment with Dr.Jordan 09/13/12.

## 2012-09-05 ENCOUNTER — Ambulatory Visit (INDEPENDENT_AMBULATORY_CARE_PROVIDER_SITE_OTHER): Payer: Medicare Other | Admitting: *Deleted

## 2012-09-05 DIAGNOSIS — E291 Testicular hypofunction: Secondary | ICD-10-CM

## 2012-09-05 DIAGNOSIS — E349 Endocrine disorder, unspecified: Secondary | ICD-10-CM

## 2012-09-05 NOTE — Progress Notes (Signed)
Pt tolerated well

## 2012-09-05 NOTE — Patient Instructions (Signed)
Testosterone This test is used to determine if your testosterone level is abnormal. This could be used to explain difficulty getting an erection (erectile dysfunction), inability of your partner to get pregnant (infertility), premature or delayed puberty if you are male, or the appearance of masculine physical features if you are male. PREPARATION FOR TEST A blood sample is obtained by inserting a needle into a vein in the arm. NORMAL FINDINGS  Free Testosterone: 0.3-2 pg/mL  % Free Testosterone: 0.1%-0.3% Total Testosterone:  7 mos-9 yrs (Tanner Stage I)  Male: Less than 30 ng/dL  Male: Less than 30 ng/dL  10-13 yrs (Tanner Stage II)  Male: Less than 300 ng/dL  Male: Less than 40 ng/dL  14-15 yrs (Tanner Stage III)  Male: 170-540 ng/dL  Male: Less than 60 ng/dL  16-19 yrs (Tanner Stage IV, V)  Male: 250-910 ng/dL  Male: Less than 70 ng/dL  20 yrs and over  Male: 280-1080 ng/dL  Male: Less than 70 ng/dL Ranges for normal findings may vary among different laboratories and hospitals. You should always check with your doctor after having lab work or other tests done to discuss the meaning of your test results and whether your values are considered within normal limits. MEANING OF TEST  Your caregiver will go over the test results with you and discuss the importance and meaning of your results, as well as treatment options and the need for additional tests if necessary. OBTAINING THE TEST RESULTS It is your responsibility to obtain your test results. Ask the lab or department performing the test when and how you will get your results. Document Released: 03/09/2004 Document Revised: 05/15/2011 Document Reviewed: 02/02/2008 [employer] Patient Information 2014 [employer].  

## 2012-09-09 ENCOUNTER — Ambulatory Visit (HOSPITAL_COMMUNITY)
Admission: RE | Admit: 2012-09-09 | Discharge: 2012-09-09 | Disposition: A | Discharge status: Home / Self Care | Payer: Medicare Other | Source: Ambulatory Visit | Attending: Cardiology | Admitting: Cardiology

## 2012-09-09 DIAGNOSIS — Z5181 Encounter for therapeutic drug level monitoring: Secondary | ICD-10-CM | POA: Insufficient documentation

## 2012-09-10 ENCOUNTER — Other Ambulatory Visit: Payer: Self-pay | Admitting: Family Medicine

## 2012-09-13 ENCOUNTER — Ambulatory Visit (INDEPENDENT_AMBULATORY_CARE_PROVIDER_SITE_OTHER): Payer: Medicare Other | Admitting: Cardiology

## 2012-09-13 ENCOUNTER — Encounter: Payer: Self-pay | Admitting: Cardiology

## 2012-09-13 VITALS — BP 162/68 | HR 65 | Ht 69.0 in | Wt 168.0 lb

## 2012-09-13 DIAGNOSIS — E785 Hyperlipidemia, unspecified: Secondary | ICD-10-CM

## 2012-09-13 DIAGNOSIS — I25810 Atherosclerosis of coronary artery bypass graft(s) without angina pectoris: Secondary | ICD-10-CM

## 2012-09-13 DIAGNOSIS — I119 Hypertensive heart disease without heart failure: Secondary | ICD-10-CM

## 2012-09-13 NOTE — Progress Notes (Signed)
Johnathan Strong Date of Birth: 1923-05-03 Medical Record #[id number]  History of Present Illness: Johnathan Strong is seen today for followup after recent cardiac catheterization. He presented in April with symptoms of dyspnea on exertion. Subsequent Myoview study showed significant inferior wall ischemia. He underwent cardiac catheterization on 08/27/2012. This demonstrated ostial occlusion of the right coronary with excellent left to right collateral flow. He had a high-grade proximal LAD stenosis. LV function was well-preserved. He is seen to discuss treatment options today. Patient does have a history of COPD Gold stage III, CLL, TIA, and hypertension. He is aspirin allergic.  Current Outpatient Prescriptions on File Prior to Visit  Medication Sig Dispense Refill  . albuterol (VENTOLIN HFA) 108 (90 BASE) MCG/ACT inhaler Inhale 2 puffs into the lungs every 6 (six) hours as needed for wheezing.       [name] amLODipine (NORVASC) 5 MG tablet Take 7.5 mg by mouth daily.      [name] atorvastatin (LIPITOR) 10 MG tablet Take 10 mg by mouth daily.      . Azelastine HCl (ASTELIN NA) Place 1 spray into the nose as needed (for congestion).       . budesonide-formoterol (SYMBICORT) 160-4.5 MCG/ACT inhaler Inhale 2 puffs into the lungs 2 (two) times daily.      . carvedilol (COREG) 12.5 MG tablet Take 1 tablet by mouth Twice daily.      . Cholecalciferol (VITAMIN D3) 1000 UNITS CAPS Take 1 capsule by mouth daily.       . clopidogrel (PLAVIX) 75 MG tablet Take 300 mg daily for 1 day then 75 mg daily  30 tablet  11  . diphenhydramine-acetaminophen (TYLENOL PM) 25-500 MG TABS Take 1 tablet by mouth at bedtime as needed.       . fluticasone (FLONASE) 50 MCG/ACT nasal spray Place 2 sprays into the nose as needed.       . furosemide (LASIX) 20 MG tablet Take 20 mg by mouth daily as needed.       [name] losartan (COZAAR) 50 MG tablet Take 1.5 tablets (75 mg total) by mouth daily.  45 tablet  11  . montelukast (SINGULAIR) 10 MG  tablet Take 10 mg by mouth at bedtime.       . nitroGLYCERIN (NITROSTAT) 0.4 MG SL tablet Place 1 tablet (0.4 mg total) under the tongue every 5 (five) minutes as needed for chest pain.  25 tablet  prn  . Omega-3 Fatty Acids (FISH OIL) 1200 MG CAPS Take 1 capsule by mouth daily.       [name] SPIRIVA HANDIHALER 18 MCG inhalation capsule INHALE ONE DOSE EVERY DAY AS DIRECTED  30 capsule  2  . testosterone cypionate (DEPOTESTOTERONE CYPIONATE) 100 MG/ML injection Inject 200 mg into the muscle every 28 (twenty-eight) days. For IM use only      . traMADol (ULTRAM) 50 MG tablet Take 50 mg by mouth 2 (two) times daily.        Current Facility-Administered Medications on File Prior to Visit  Medication Dose Route Frequency Provider Last Rate Last Dose  . testosterone cypionate (DEPOTESTOTERONE CYPIONATE) injection 200 mg  200 mg Intramuscular Q28 days Ernestina Penna, MD   200 mg at 09/05/12 1435    Allergies  Allergen Reactions  . Aspirin Anaphylaxis, Shortness Of Breath and Swelling  . Nsaids Anaphylaxis, Shortness Of Breath and Swelling  . Benicar (Olmesartan Medoxomil)     dizzy  . Salicylates     SOB  . Ampicillin Rash  Past Medical History  Diagnosis Date  . Osteopenia 2006  . Hyperlipidemia   . BPH (benign prostatic hypertrophy)   . Transient global amnesia   . Osteoarthritis   . History of TIA (transient ischemic attack) 1980'S    NO RESIDUAL  . COPD (chronic obstructive pulmonary disease) with emphysema   . Dyspnea on exertion   . Chronic back pain   . Itching SECONDARY TO CLL-- CONTROLLED W/ SINGULAIR  . Frequency   . Nocturia   . Hypertension CARDIOLOGIST- DR BRACKBILL-- LAST VISIT 12-15-2010 NOTE IN EPIC  . CLL (chronic lymphocytic leukemia) LAST PLT COUNT APRL 2012  168    Managed at [facility]  BY DR [facility]  . Bladder cancer   . Aspirin allergy     Eyes swell    Past Surgical History  Procedure Laterality Date  . Hemorroidectomy  1980  . Cardiovascular stress test       a. 07/2005- no reversible ischemia, normal EF b. 06/2009- EF 73%, no reversible ischemia, inferolateral TWIs at rest, upright in recovery c. 08/2012- mediuim-sized partially reversible basal to mid inferior and inferoseptal perfusion defect c/w with prior infarction and peri-infarct ischemia; EF 68% and borderline ST/T changes on stress ECG   . Transurethral resection of bladder tumor  10-19-2008    AND DILATION URETHRAL STRICTURE  . Cataract extraction w/ intraocular lens  implant, bilateral    . Transthoracic echocardiogram  01-03-2011    LVEF 60-65%, grade 1 diastolic dysfunction, no WMAs or structural abnormalities  . Cystoscopy with biopsy  10/30/2011    Procedure: CYSTOSCOPY WITH BIOPSY;  Surgeon: Garnett Farm, MD;  Location: [facility];  Service: Urology;  Laterality: N/A;    History  Smoking status  . Former Smoker -- 1.00 packs/day for 25 years  . Types: Cigarettes  . Quit date: 03/06/1958  Smokeless tobacco  . Former Neurosurgeon  . Quit date: 10/25/1978    History  Alcohol Use  . Yes    Comment: very rare    Family History  Problem Relation Age of Onset  . Emphysema Father   . Heart disease Father   . COPD Father   . Atrial fibrillation Father   . Kidney cancer Mother   . Kidney failure Mother     Review of Systems: As noted in history of present illness..  All other systems were reviewed and are negative.  Physical Exam: BP 162/68  Pulse 65  Ht 5\' 9"  (1.753 m)  Wt 168 lb (76.204 kg)  BMI 24.8 kg/m2  SpO2 98% He is a pleasant elderly white male in no acute distress. HEENT: Normocephalic, atraumatic. Pupils equal round and reactive to light accommodation. Extraocular movements are full. Oropharynx is clear. Neck is supple no JVD, adenopathy, thyromegaly, or bruits. Lungs: Clear to auscultation and percussion. No wheezes. Cardiovascular: Regular rate and rhythm, normal S1 and S2, no gallop, murmur, or click. Abdomen: Soft and nontender. No  hepatosplenomegaly or masses. Bowel sounds are positive. Extremities: No cyanosis, clubbing, or edema. Pedal pulses are 2+. Radial pulses are 2+. Skin: Warm and dry Neuro: Alert and oriented x3. Cranial nerves II through XII are intact. LABORATORY DATA: Cardiac Catheterization Procedure Note  Name: Johnathan Strong  MRN: [id number]  DOB: 14-Sep-1923  Procedure: Left Heart Cath, Selective Coronary Angiography, LV angiography  Indication: 79 year old white male with history of COPD Gold stage III, CLL, bladder cancer, and history of TIA who presents with symptoms of exertional dyspnea. Stress Myoview study demonstrated  a large inferior and inferior septal perfusion abnormality with partial reversibility. Ejection fraction was 68%. The patient is aspirin allergic.  Procedural Details: The right wrist was prepped, draped, and anesthetized with 1% lidocaine. Using the modified Seldinger technique, a 5 French sheath was introduced into the right radial artery. 3 mg of verapamil was administered through the sheath, weight-based unfractionated heparin was administered intravenously. Standard Judkins catheters were used for selective coronary angiography and left ventriculography. The right coronary was difficult to engage. With an ERAD right coronary catheter we were able to obtain flush shots of the ostium. Catheter exchanges were performed over an exchange length guidewire. There were no immediate procedural complications. A TR band was used for radial hemostasis at the completion of the procedure. The patient was transferred to the post catheterization recovery area for further monitoring.  Procedural Findings:  Hemodynamics:  AO 135/52 with a mean of 86 mmHg  LV 133/17 mmHg  Coronary angiography:  Coronary dominance: right  Left mainstem: The left main coronary is short and without significant disease.  Left anterior descending (LAD): The LAD is moderately calcified in the proximal vessel. There is a  focal, complex stenosis in the proximal vessel up to 90%.  Left circumflex (LCx): The left circumflex has mild disease up to 20-30%.  Right coronary artery (RCA): The right coronary is heavily calcified at the ostium and proximal vessel. It is occluded at the ostium. The right coronary is a dominant vessel that fills entirely by left to right collaterals.  Left ventriculography: Left ventricular systolic function is normal, LVEF is estimated at 55-65%, there is no significant mitral regurgitation  Final Conclusions:  1. Severe two-vessel obstructive coronary disease. There is a high-grade proximal LAD stenosis. The right coronary is occluded at the ostium with excellent collateral flow.  2. Good LV function.  Recommendations: Will discuss options with patient and family. Given his advanced age and multiple medical problems he would appear to be a poor surgical candidate. We could consider PCI of the proximal LAD. The right coronary is not suitable for PCI. We will need to consider optimal antiplatelet therapy since the patient is aspirin allergic.  Theron Arista [facility]  08/27/2012, 8:42 AM    Assessment / Plan: 1. Coronary disease with class III angina with dyspnea on exertion. Myoview study with significant inferior wall ischemia. Cardiac catheterization demonstrates severe two-vessel obstructive coronary disease with ostial occlusion of the right coronary and high-grade stenosis in the proximal LAD. The right coronary fills completely by collaterals. He has been on 2 antianginal agents and is still symptomatic. We discussed the option of coronary bypass surgery. Given his advanced age, history TIA, CLL, and COPD I do not think he is on optimal surgical candidate. I recommended PCI with stenting of the proximal LAD. I think with revascularization of this vessel his symptoms should improve significantly. The right coronary is well collateralized and should do well with medical treatment only. We discussed  the procedure and risk of PCI including potential for death, myocardial infarction, arrhythmia, bleeding, and renal failure. The patient and his family agree to proceed. We will schedule this for July 24 inject baseline lab work a week prior to this. 2. COPD. 3. CLL. 4. Aspirin allergy. Patient has been placed on Plavix and P2Y12 assay was consistent with excellent response at 79 P-R U.

## 2012-09-18 ENCOUNTER — Other Ambulatory Visit (INDEPENDENT_AMBULATORY_CARE_PROVIDER_SITE_OTHER): Payer: Medicare Other

## 2012-09-18 ENCOUNTER — Encounter: Payer: Self-pay | Admitting: Cardiology

## 2012-09-18 ENCOUNTER — Encounter (HOSPITAL_COMMUNITY): Payer: Self-pay | Admitting: Pharmacy Technician

## 2012-09-18 ENCOUNTER — Other Ambulatory Visit: Payer: Self-pay

## 2012-09-18 DIAGNOSIS — I25810 Atherosclerosis of coronary artery bypass graft(s) without angina pectoris: Secondary | ICD-10-CM

## 2012-09-18 LAB — BASIC METABOLIC PANEL
BUN: 15 mg/dL (ref 6–23)
CO2: 28 mEq/L (ref 19–32)
Chloride: 103 mEq/L (ref 96–112)
Creatinine, Ser: 1.4 mg/dL (ref 0.4–1.5)
Potassium: 4.5 mEq/L (ref 3.5–5.1)

## 2012-09-18 LAB — CBC WITH DIFFERENTIAL/PLATELET
Basophils Relative: 0.3 % (ref 0.0–3.0)
Eosinophils Absolute: 0.3 10*3/uL (ref 0.0–0.7)
Eosinophils Relative: 3.3 % (ref 0.0–5.0)
HCT: 32.4 % — ABNORMAL LOW (ref 39.0–52.0)
Lymphs Abs: 3.9 10*3/uL (ref 0.7–4.0)
MCHC: 33.4 g/dL (ref 30.0–36.0)
MCV: 98.3 fl (ref 78.0–100.0)
Monocytes Absolute: 0.5 10*3/uL (ref 0.1–1.0)
Neutrophils Relative %: 48.1 % (ref 43.0–77.0)
RBC: 3.29 Mil/uL — ABNORMAL LOW (ref 4.22–5.81)
WBC: 9.3 10*3/uL (ref 4.5–10.5)

## 2012-09-18 LAB — PROTIME-INR: INR: 1.3 ratio — ABNORMAL HIGH (ref 0.8–1.0)

## 2012-09-26 ENCOUNTER — Ambulatory Visit (HOSPITAL_COMMUNITY)
Admission: RE | Admit: 2012-09-26 | Discharge: 2012-09-27 | Disposition: A | Discharge status: Home / Self Care | Payer: Medicare Other | Source: Ambulatory Visit | Attending: Cardiology | Admitting: Cardiology

## 2012-09-26 ENCOUNTER — Encounter (HOSPITAL_COMMUNITY)
Admission: RE | Disposition: A | Discharge status: Home / Self Care | Payer: Self-pay | Source: Ambulatory Visit | Attending: Cardiology

## 2012-09-26 ENCOUNTER — Encounter (HOSPITAL_COMMUNITY): Payer: Self-pay | Admitting: General Practice

## 2012-09-26 DIAGNOSIS — J4489 Other specified chronic obstructive pulmonary disease: Secondary | ICD-10-CM | POA: Insufficient documentation

## 2012-09-26 DIAGNOSIS — J449 Chronic obstructive pulmonary disease, unspecified: Secondary | ICD-10-CM

## 2012-09-26 DIAGNOSIS — E785 Hyperlipidemia, unspecified: Secondary | ICD-10-CM | POA: Diagnosis present

## 2012-09-26 DIAGNOSIS — I2510 Atherosclerotic heart disease of native coronary artery without angina pectoris: Secondary | ICD-10-CM

## 2012-09-26 DIAGNOSIS — I25810 Atherosclerosis of coronary artery bypass graft(s) without angina pectoris: Secondary | ICD-10-CM

## 2012-09-26 DIAGNOSIS — IMO0001 Reserved for inherently not codable concepts without codable children: Secondary | ICD-10-CM | POA: Diagnosis present

## 2012-09-26 DIAGNOSIS — C9110 Chronic lymphocytic leukemia of B-cell type not having achieved remission: Secondary | ICD-10-CM | POA: Insufficient documentation

## 2012-09-26 DIAGNOSIS — I200 Unstable angina: Secondary | ICD-10-CM

## 2012-09-26 DIAGNOSIS — I10 Essential (primary) hypertension: Secondary | ICD-10-CM | POA: Insufficient documentation

## 2012-09-26 DIAGNOSIS — I119 Hypertensive heart disease without heart failure: Secondary | ICD-10-CM | POA: Diagnosis present

## 2012-09-26 HISTORY — PX: CORONARY STENT PLACEMENT: SHX1402

## 2012-09-26 HISTORY — DX: Unspecified asthma, uncomplicated: J45.909

## 2012-09-26 HISTORY — DX: Atherosclerotic heart disease of native coronary artery without angina pectoris: I25.10

## 2012-09-26 HISTORY — PX: CORONARY ANGIOPLASTY WITH STENT PLACEMENT: SHX49

## 2012-09-26 HISTORY — PX: PERCUTANEOUS CORONARY STENT INTERVENTION (PCI-S): SHX5485

## 2012-09-26 LAB — POCT ACTIVATED CLOTTING TIME: Activated Clotting Time: 422 seconds

## 2012-09-26 SURGERY — PERCUTANEOUS CORONARY STENT INTERVENTION (PCI-S)
Anesthesia: LOCAL

## 2012-09-26 MED ORDER — FUROSEMIDE 20 MG PO TABS
20.0000 mg | ORAL_TABLET | Freq: Every day | ORAL | Status: DC | PRN
  Filled 2012-09-26: qty 1

## 2012-09-26 MED ORDER — CLOPIDOGREL BISULFATE 75 MG PO TABS: 75.0000 mg | ORAL_TABLET | Freq: Every day | ORAL | Status: DC

## 2012-09-26 MED ORDER — ATORVASTATIN CALCIUM 10 MG PO TABS
10.0000 mg | ORAL_TABLET | Freq: Every day | ORAL | Status: DC
  Administered 2012-09-26 (×2): 10 mg via ORAL
  Filled 2012-09-26 (×2): qty 1

## 2012-09-26 MED ORDER — AZELASTINE HCL 0.1 % NA SOLN
1.0000 | Freq: Two times a day (BID) | NASAL | Status: DC | PRN
  Filled 2012-09-26: qty 30

## 2012-09-26 MED ORDER — FENTANYL CITRATE 0.05 MG/ML IJ SOLN
INTRAMUSCULAR | Status: AC
  Filled 2012-09-26: qty 2

## 2012-09-26 MED ORDER — MIDAZOLAM HCL 2 MG/2ML IJ SOLN
INTRAMUSCULAR | Status: AC
  Filled 2012-09-26: qty 2

## 2012-09-26 MED ORDER — VERAPAMIL HCL 2.5 MG/ML IV SOLN
INTRAVENOUS | Status: AC
  Filled 2012-09-26: qty 2

## 2012-09-26 MED ORDER — AMLODIPINE BESYLATE 5 MG PO TABS
7.5000 mg | ORAL_TABLET | Freq: Every day | ORAL | Status: DC
  Administered 2012-09-26: 7.5 mg via ORAL
  Filled 2012-09-26 (×2): qty 1

## 2012-09-26 MED ORDER — TRAMADOL HCL 50 MG PO TABS
50.0000 mg | ORAL_TABLET | Freq: Two times a day (BID) | ORAL | Status: DC
  Administered 2012-09-26: 21:00:00 50 mg via ORAL
  Filled 2012-09-26: qty 1

## 2012-09-26 MED ORDER — ONDANSETRON HCL 4 MG/2ML IJ SOLN: 4.0000 mg | Freq: Four times a day (QID) | INTRAMUSCULAR | Status: DC | PRN

## 2012-09-26 MED ORDER — SODIUM CHLORIDE 0.9 % IJ SOLN: 3.0000 mL | Freq: Two times a day (BID) | INTRAMUSCULAR | Status: DC

## 2012-09-26 MED ORDER — LIDOCAINE HCL (PF) 1 % IJ SOLN
INTRAMUSCULAR | Status: AC
  Filled 2012-09-26: qty 30

## 2012-09-26 MED ORDER — SODIUM CHLORIDE 0.9 % IJ SOLN: 3.0000 mL | INTRAMUSCULAR | Status: DC | PRN

## 2012-09-26 MED ORDER — CLOPIDOGREL BISULFATE 75 MG PO TABS: 75.0000 mg | ORAL_TABLET | Freq: Once | ORAL | Status: DC

## 2012-09-26 MED ORDER — ZOLPIDEM TARTRATE 5 MG PO TABS: 5.0000 mg | ORAL_TABLET | Freq: Every evening | ORAL | Status: DC | PRN

## 2012-09-26 MED ORDER — HEPARIN SODIUM (PORCINE) 1000 UNIT/ML IJ SOLN
INTRAMUSCULAR | Status: AC
  Filled 2012-09-26: qty 1

## 2012-09-26 MED ORDER — SODIUM CHLORIDE 0.9 % IV SOLN: 250.0000 mL | INTRAVENOUS | Status: DC | PRN

## 2012-09-26 MED ORDER — CARVEDILOL 12.5 MG PO TABS
12.5000 mg | ORAL_TABLET | Freq: Two times a day (BID) | ORAL | Status: DC
  Administered 2012-09-26: 12.5 mg via ORAL
  Filled 2012-09-26: qty 1

## 2012-09-26 MED ORDER — OMEGA-3-ACID ETHYL ESTERS 1 G PO CAPS
1.0000 g | ORAL_CAPSULE | Freq: Two times a day (BID) | ORAL | Status: DC
  Administered 2012-09-26: 1 g via ORAL
  Filled 2012-09-26 (×3): qty 1

## 2012-09-26 MED ORDER — VITAMIN D3 25 MCG (1000 UNIT) PO TABS
1000.0000 [IU] | ORAL_TABLET | Freq: Every day | ORAL | Status: DC
  Filled 2012-09-26 (×2): qty 1

## 2012-09-26 MED ORDER — SODIUM CHLORIDE 0.9 % IV SOLN: INTRAVENOUS | Status: DC

## 2012-09-26 MED ORDER — FLUTICASONE PROPIONATE 50 MCG/ACT NA SUSP
2.0000 | NASAL | Status: DC | PRN
  Filled 2012-09-26: qty 16

## 2012-09-26 MED ORDER — BUDESONIDE-FORMOTEROL FUMARATE 160-4.5 MCG/ACT IN AERO
2.0000 | INHALATION_SPRAY | Freq: Two times a day (BID) | RESPIRATORY_TRACT | Status: DC
  Filled 2012-09-26: qty 6

## 2012-09-26 MED ORDER — TIOTROPIUM BROMIDE MONOHYDRATE 18 MCG IN CAPS
18.0000 ug | ORAL_CAPSULE | Freq: Every day | RESPIRATORY_TRACT | Status: DC
  Administered 2012-09-26: 17:00:00 18 ug via RESPIRATORY_TRACT
  Filled 2012-09-26: qty 5

## 2012-09-26 MED ORDER — DIPHENHYDRAMINE-APAP (SLEEP) 25-500 MG PO TABS: 1.0000 | ORAL_TABLET | Freq: Every evening | ORAL | Status: DC | PRN

## 2012-09-26 MED ORDER — HEPARIN (PORCINE) IN NACL 2-0.9 UNIT/ML-% IJ SOLN
INTRAMUSCULAR | Status: AC
  Filled 2012-09-26: qty 1000

## 2012-09-26 MED ORDER — DIPHENHYDRAMINE HCL 25 MG PO CAPS
25.0000 mg | ORAL_CAPSULE | Freq: Every evening | ORAL | Status: DC | PRN
  Administered 2012-09-26: 21:00:00 25 mg via ORAL
  Filled 2012-09-26: qty 1

## 2012-09-26 MED ORDER — NITROGLYCERIN 0.2 MG/ML ON CALL CATH LAB
INTRAVENOUS | Status: AC
  Filled 2012-09-26: qty 1

## 2012-09-26 MED ORDER — ACETAMINOPHEN 325 MG PO TABS
650.0000 mg | ORAL_TABLET | ORAL | Status: DC | PRN
  Filled 2012-09-26: qty 2
  Filled 2012-09-26: qty 1

## 2012-09-26 MED ORDER — ALBUTEROL SULFATE HFA 108 (90 BASE) MCG/ACT IN AERS
2.0000 | INHALATION_SPRAY | Freq: Four times a day (QID) | RESPIRATORY_TRACT | Status: DC | PRN
Start: 2012-09-26 — End: 2012-09-27
  Filled 2012-09-26: qty 6.7

## 2012-09-26 MED ORDER — NITROGLYCERIN 0.4 MG SL SUBL: 0.4000 mg | SUBLINGUAL_TABLET | SUBLINGUAL | Status: DC | PRN

## 2012-09-26 MED ORDER — ACETAMINOPHEN 500 MG PO TABS
500.0000 mg | ORAL_TABLET | Freq: Every evening | ORAL | Status: DC | PRN
  Administered 2012-09-26: 500 mg via ORAL
  Filled 2012-09-26: qty 1

## 2012-09-26 MED ORDER — SODIUM CHLORIDE 0.9 % IV SOLN: 1.0000 mL/kg/h | INTRAVENOUS | Status: AC

## 2012-09-26 MED ORDER — MONTELUKAST SODIUM 10 MG PO TABS
10.0000 mg | ORAL_TABLET | Freq: Every day | ORAL | Status: DC
  Administered 2012-09-26: 21:00:00 10 mg via ORAL
  Filled 2012-09-26 (×2): qty 1

## 2012-09-26 MED ORDER — LOSARTAN POTASSIUM 50 MG PO TABS
50.0000 mg | ORAL_TABLET | Freq: Every day | ORAL | Status: DC
  Filled 2012-09-26 (×2): qty 1

## 2012-09-26 NOTE — CV Procedure (Signed)
   CARDIAC CATH NOTE  Name: Johnathan Strong MRN: [id number] DOB: [birth year]  Procedure: PTCA and stenting of the proximal LAD  Indication: 79 yo WM with class 3 angina despite medical therapy. Myoview study showed a large area of inferior ischemia. Diagnostic cath showed 90% proximal LAD stenosis with occlusion of the ostial RCA with left to right collaterals. Patient is ASA allergic and has been loaded with Plavix.  Procedural Details: The right wrist was prepped, draped, and anesthetized with 1% lidocaine. Using the modified Seldinger technique, a 6 Fr sheath was introduced into the radial artery. 3 mg verapamil was administered through the radial sheath. Weight-based heparin was given for anticoagulation. Once a therapeutic ACT was achieved, a 6 [place] XBLAD 3.5 guide catheter was inserted.  A prowater coronary guidewire was used to cross the lesion.  The lesion was predilated with a 2.5 mm balloon.  The lesion was then stented with a 3.0 x 16 mm Promus stent.  The stent was postdilated with a 3.25 mm noncompliant balloon.  Following PCI, there was 0% residual stenosis and TIMI-3 flow. Final angiography confirmed an excellent result. The patient tolerated the procedure well. There were no immediate procedural complications. A TR band was used for radial hemostasis. The patient was transferred to the post catheterization recovery area for further monitoring.  Lesion Data: Vessel: Proximal LAD Percent stenosis (pre): 90% TIMI-flow (pre):  3 Stent:  3.0 x 16 mm Promus Percent stenosis (post): 0% TIMI-flow (post): 3  Conclusions: Successful stenting of the proximal LAD with a DES.  Recommendations: continue Plavix indefinitely. No ASA since he is allergic.  Theron Arista [facility]  09/26/2012, 11:26 AM

## 2012-09-26 NOTE — H&P (View-Only) (Signed)
 Johnathan Strong Date of Birth: 05/12/1923 Medical Record #[id number]  History of Present Illness: Johnathan Strong is seen today for followup after recent cardiac catheterization. He presented in April with symptoms of dyspnea on exertion. Subsequent Myoview study showed significant inferior wall ischemia. He underwent cardiac catheterization on 08/27/2012. This demonstrated ostial occlusion of the right coronary with excellent left to right collateral flow. He had a high-grade proximal LAD stenosis. LV function was well-preserved. He is seen to discuss treatment options today. Patient does have a history of COPD Gold stage III, CLL, TIA, and hypertension. He is aspirin allergic.  Current Outpatient Prescriptions on File Prior to Visit  Medication Sig Dispense Refill  . albuterol (VENTOLIN HFA) 108 (90 BASE) MCG/ACT inhaler Inhale 2 puffs into the lungs every 6 (six) hours as needed for wheezing.       . amLODipine (NORVASC) 5 MG tablet Take 7.5 mg by mouth daily.      . atorvastatin (LIPITOR) 10 MG tablet Take 10 mg by mouth daily.      . Azelastine HCl (ASTELIN NA) Place 1 spray into the nose as needed (for congestion).       . budesonide-formoterol (SYMBICORT) 160-4.5 MCG/ACT inhaler Inhale 2 puffs into the lungs 2 (two) times daily.      . carvedilol (COREG) 12.5 MG tablet Take 1 tablet by mouth Twice daily.      . Cholecalciferol (VITAMIN D3) 1000 UNITS CAPS Take 1 capsule by mouth daily.       . clopidogrel (PLAVIX) 75 MG tablet Take 300 mg daily for 1 day then 75 mg daily  30 tablet  11  . diphenhydramine-acetaminophen (TYLENOL PM) 25-500 MG TABS Take 1 tablet by mouth at bedtime as needed.       . fluticasone (FLONASE) 50 MCG/ACT nasal spray Place 2 sprays into the nose as needed.       . furosemide (LASIX) 20 MG tablet Take 20 mg by mouth daily as needed.       . losartan (COZAAR) 50 MG tablet Take 1.5 tablets (75 mg total) by mouth daily.  45 tablet  11  . montelukast (SINGULAIR) 10 MG  tablet Take 10 mg by mouth at bedtime.       . nitroGLYCERIN (NITROSTAT) 0.4 MG SL tablet Place 1 tablet (0.4 mg total) under the tongue every 5 (five) minutes as needed for chest pain.  25 tablet  prn  . Omega-3 Fatty Acids (FISH OIL) 1200 MG CAPS Take 1 capsule by mouth daily.       . SPIRIVA HANDIHALER 18 MCG inhalation capsule INHALE ONE DOSE EVERY DAY AS DIRECTED  30 capsule  2  . testosterone cypionate (DEPOTESTOTERONE CYPIONATE) 100 MG/ML injection Inject 200 mg into the muscle every 28 (twenty-eight) days. For IM use only      . traMADol (ULTRAM) 50 MG tablet Take 50 mg by mouth 2 (two) times daily.        Current Facility-Administered Medications on File Prior to Visit  Medication Dose Route Frequency Provider Last Rate Last Dose  . testosterone cypionate (DEPOTESTOTERONE CYPIONATE) injection 200 mg  200 mg Intramuscular Q28 days Donald W Moore, MD   200 mg at 09/05/12 1435    Allergies  Allergen Reactions  . Aspirin Anaphylaxis, Shortness Of Breath and Swelling  . Nsaids Anaphylaxis, Shortness Of Breath and Swelling  . Benicar (Olmesartan Medoxomil)     dizzy  . Salicylates     SOB  . Ampicillin Rash      Past Medical History  Diagnosis Date  . Osteopenia 2006  . Hyperlipidemia   . BPH (benign prostatic hypertrophy)   . Transient global amnesia   . Osteoarthritis   . History of TIA (transient ischemic attack) 1980'S    NO RESIDUAL  . COPD (chronic obstructive pulmonary disease) with emphysema   . Dyspnea on exertion   . Chronic back pain   . Itching SECONDARY TO CLL-- CONTROLLED W/ SINGULAIR  . Frequency   . Nocturia   . Hypertension CARDIOLOGIST- DR BRACKBILL-- LAST VISIT 12-15-2010 NOTE IN EPIC  . CLL (chronic lymphocytic leukemia) LAST PLT COUNT APRL 2012  168    Managed at [facility]  BY DR JOSEPH MOORE  . Bladder cancer   . Aspirin allergy     Eyes swell    Past Surgical History  Procedure Laterality Date  . Hemorroidectomy  1980  . Cardiovascular stress test       a. 07/2005- no reversible ischemia, normal EF b. 06/2009- EF 73%, no reversible ischemia, inferolateral TWIs at rest, upright in recovery c. 08/2012- mediuim-sized partially reversible basal to mid inferior and inferoseptal perfusion defect c/w with prior infarction and peri-infarct ischemia; EF 68% and borderline ST/T changes on stress ECG   . Transurethral resection of bladder tumor  10-19-2008    AND DILATION URETHRAL STRICTURE  . Cataract extraction w/ intraocular lens  implant, bilateral    . Transthoracic echocardiogram  01-03-2011    LVEF 60-65%, grade 1 diastolic dysfunction, no WMAs or structural abnormalities  . Cystoscopy with biopsy  10/30/2011    Procedure: CYSTOSCOPY WITH BIOPSY;  Surgeon: Mark C Ottelin, MD;  Location: [facility];  Service: Urology;  Laterality: N/A;    History  Smoking status  . Former Smoker -- 1.00 packs/day for 25 years  . Types: Cigarettes  . Quit date: 03/06/1958  Smokeless tobacco  . Former User  . Quit date: 10/25/1978    History  Alcohol Use  . Yes    Comment: very rare    Family History  Problem Relation Age of Onset  . Emphysema Father   . Heart disease Father   . COPD Father   . Atrial fibrillation Father   . Kidney cancer Mother   . Kidney failure Mother     Review of Systems: As noted in history of present illness..  All other systems were reviewed and are negative.  Physical Exam: BP 162/68  Pulse 65  Ht 5' 9" (1.753 m)  Wt 168 lb (76.204 kg)  BMI 24.8 kg/m2  SpO2 98% He is a pleasant elderly white male in no acute distress. HEENT: Normocephalic, atraumatic. Pupils equal round and reactive to light accommodation. Extraocular movements are full. Oropharynx is clear. Neck is supple no JVD, adenopathy, thyromegaly, or bruits. Lungs: Clear to auscultation and percussion. No wheezes. Cardiovascular: Regular rate and rhythm, normal S1 and S2, no gallop, murmur, or click. Abdomen: Soft and nontender. No  hepatosplenomegaly or masses. Bowel sounds are positive. Extremities: No cyanosis, clubbing, or edema. Pedal pulses are 2+. Radial pulses are 2+. Skin: Warm and dry Neuro: Alert and oriented x3. Cranial nerves II through XII are intact. LABORATORY DATA: Cardiac Catheterization Procedure Note  Name: Johnathan Strong  MRN: [id number]  DOB: 08/27/1923  Procedure: Left Heart Cath, Selective Coronary Angiography, LV angiography  Indication: 79-year-old white male with history of COPD Gold stage III, CLL, bladder cancer, and history of TIA who presents with symptoms of exertional dyspnea. Stress Myoview study demonstrated   a large inferior and inferior septal perfusion abnormality with partial reversibility. Ejection fraction was 68%. The patient is aspirin allergic.  Procedural Details: The right wrist was prepped, draped, and anesthetized with 1% lidocaine. Using the modified Seldinger technique, a 5 French sheath was introduced into the right radial artery. 3 mg of verapamil was administered through the sheath, weight-based unfractionated heparin was administered intravenously. Standard Judkins catheters were used for selective coronary angiography and left ventriculography. The right coronary was difficult to engage. With an ERAD right coronary catheter we were able to obtain flush shots of the ostium. Catheter exchanges were performed over an exchange length guidewire. There were no immediate procedural complications. A TR band was used for radial hemostasis at the completion of the procedure. The patient was transferred to the post catheterization recovery area for further monitoring.  Procedural Findings:  Hemodynamics:  AO 135/52 with a mean of 86 mmHg  LV 133/17 mmHg  Coronary angiography:  Coronary dominance: right  Left mainstem: The left main coronary is short and without significant disease.  Left anterior descending (LAD): The LAD is moderately calcified in the proximal vessel. There is a  focal, complex stenosis in the proximal vessel up to 90%.  Left circumflex (LCx): The left circumflex has mild disease up to 20-30%.  Right coronary artery (RCA): The right coronary is heavily calcified at the ostium and proximal vessel. It is occluded at the ostium. The right coronary is a dominant vessel that fills entirely by left to right collaterals.  Left ventriculography: Left ventricular systolic function is normal, LVEF is estimated at 55-65%, there is no significant mitral regurgitation  Final Conclusions:  1. Severe two-vessel obstructive coronary disease. There is a high-grade proximal LAD stenosis. The right coronary is occluded at the ostium with excellent collateral flow.  2. Good LV function.  Recommendations: Will discuss options with patient and family. Given his advanced age and multiple medical problems he would appear to be a poor surgical candidate. We could consider PCI of the proximal LAD. The right coronary is not suitable for PCI. We will need to consider optimal antiplatelet therapy since the patient is aspirin allergic.  Jeanne Terrance JordanMD,FACC  08/27/2012, 8:42 AM    Assessment / Plan: 1. Coronary disease with class III angina with dyspnea on exertion. Myoview study with significant inferior wall ischemia. Cardiac catheterization demonstrates severe two-vessel obstructive coronary disease with ostial occlusion of the right coronary and high-grade stenosis in the proximal LAD. The right coronary fills completely by collaterals. He has been on 2 antianginal agents and is still symptomatic. We discussed the option of coronary bypass surgery. Given his advanced age, history TIA, CLL, and COPD I do not think he is on optimal surgical candidate. I recommended PCI with stenting of the proximal LAD. I think with revascularization of this vessel his symptoms should improve significantly. The right coronary is well collateralized and should do well with medical treatment only. We discussed  the procedure and risk of PCI including potential for death, myocardial infarction, arrhythmia, bleeding, and renal failure. The patient and his family agree to proceed. We will schedule this for July 24 inject baseline lab work a week prior to this. 2. COPD. 3. CLL. 4. Aspirin allergy. Patient has been placed on Plavix and P2Y12 assay was consistent with excellent response at 79 P-R U. 

## 2012-09-26 NOTE — Progress Notes (Signed)
Pt states he took his 75mg  Plavix at home

## 2012-09-26 NOTE — Interval H&P Note (Signed)
History and Physical Interval Note:  09/26/2012 10:26 AM  Johnathan Strong  has presented today for surgery, with the diagnosis of cad  The various methods of treatment have been discussed with the patient and family. After consideration of risks, benefits and other options for treatment, the patient has consented to  Procedure(s): PERCUTANEOUS CORONARY STENT INTERVENTION (PCI-S) (N/A) as a surgical intervention .  The patient's history has been reviewed, patient examined, no change in status, stable for surgery.  I have reviewed the patient's chart and labs.  Questions were answered to the patient's satisfaction.   Cath Lab Visit (complete for each Cath Lab visit)  Clinical Evaluation Leading to the Procedure:   ACS: no  Non-ACS:    Anginal Classification: CCS III  Anti-ischemic medical therapy: Maximal Therapy (2 or more classes of medications)  Non-Invasive Test Results: High-risk stress test findings: cardiac mortality >3%/year  Prior CABG: No previous CABG        Johnathan Strong [facility] 09/26/2012 10:26 AM

## 2012-09-27 ENCOUNTER — Encounter (HOSPITAL_COMMUNITY): Payer: Self-pay | Admitting: Nurse Practitioner

## 2012-09-27 DIAGNOSIS — C9100 Acute lymphoblastic leukemia not having achieved remission: Secondary | ICD-10-CM

## 2012-09-27 DIAGNOSIS — I200 Unstable angina: Secondary | ICD-10-CM

## 2012-09-27 DIAGNOSIS — I2510 Atherosclerotic heart disease of native coronary artery without angina pectoris: Secondary | ICD-10-CM

## 2012-09-27 DIAGNOSIS — J449 Chronic obstructive pulmonary disease, unspecified: Secondary | ICD-10-CM

## 2012-09-27 LAB — BASIC METABOLIC PANEL
BUN: 16 mg/dL (ref 6–23)
Creatinine, Ser: 1.14 mg/dL (ref 0.50–1.35)
GFR calc Af Amer: 65 mL/min — ABNORMAL LOW (ref >90)
GFR calc non Af Amer: 56 mL/min — ABNORMAL LOW (ref >90)
Potassium: 3.7 mEq/L (ref 3.5–5.1)

## 2012-09-27 LAB — CBC
HCT: 33 % — ABNORMAL LOW (ref 39.0–52.0)
MCHC: 34.2 g/dL (ref 30.0–36.0)
Platelets: 157 10*3/uL (ref 150–400)
RDW: 19.4 % — ABNORMAL HIGH (ref 11.5–15.5)

## 2012-09-27 MED ORDER — CLOPIDOGREL BISULFATE 75 MG PO TABS: 75.0000 mg | ORAL_TABLET | Freq: Every day | ORAL | Status: DC

## 2012-09-27 MED ORDER — NITROGLYCERIN 0.4 MG SL SUBL: 0.4000 mg | SUBLINGUAL_TABLET | SUBLINGUAL | Status: AC | PRN

## 2012-09-27 NOTE — Progress Notes (Signed)
   Patient Name: Johnathan Strong Date of Encounter: 09/27/2012   Principal Problem:   Unstable angina Active Problems:   CAD (coronary artery disease)   COPD GOLD III   Hypertension   Chronic lymphoblastic leukemia   Dyslipidemia   SUBJECTIVE  No chest pain or sob.  Ambulated some last night.  CURRENT MEDS . amLODipine  7.5 mg Oral Daily  . atorvastatin  10 mg Oral Daily  . budesonide-formoterol  2 puff Inhalation BID  . carvedilol  12.5 mg Oral BID WC  . cholecalciferol  1,000 Units Oral Daily  . clopidogrel  75 mg Oral Daily  . losartan  50 mg Oral Daily  . montelukast  10 mg Oral QHS  . omega-3 acid ethyl esters  1 g Oral BID  . tiotropium  18 mcg Inhalation Daily  . traMADol  50 mg Oral BID    OBJECTIVE  Filed Vitals:   09/26/12 1900 09/26/12 2000 09/27/12 0000 09/27/12 0500  BP: 127/99 121/38 139/46 144/43  Pulse:      Temp:  97.6 F (36.4 C) 98.6 F (37 C) 98.1 F (36.7 C)  TempSrc:  Oral Oral Oral  Resp:  20 16 20   Height:      Weight:    167 lb 8.8 oz (76 kg)  SpO2:  96% 95% 97%    Intake/Output Summary (Last 24 hours) at 09/27/12 0656 Last data filed at 09/27/12 0600  Gross per 24 hour  Intake 1276.2 ml  Output      0 ml  Net 1276.2 ml   Filed Weights   09/26/12 0914 09/27/12 0500  Weight: 168 lb (76.204 kg) 167 lb 8.8 oz (76 kg)    PHYSICAL EXAM  General: Pleasant, NAD. Neuro: Alert and oriented X 3. Moves all extremities spontaneously. Psych: Normal affect. HEENT:  Normal  Neck: Supple without bruits or JVD. Lungs:  Resp regular and unlabored, diminished breath sounds bilat. Heart: RRR no s3, s4, or murmurs. Abdomen: Soft, non-tender, non-distended, BS + x 4.  Extremities: No clubbing, cyanosis or edema. DP/PT/Radials 2+ and equal bilaterally.  R radial cath site w/o bleeding/bruit/hematoma.  Accessory Clinical Findings  CBC pending Basic Metabolic Panel pending  TELE  rsr  ECG  Sb, 57, 1st deg avb,  lvh.  Radiology/Studies  No results found.  ASSESSMENT AND PLAN  1.  USA/CAD:  S/p pci/des to the LAD yesterday.  Walked some last night w/o recurrent dyspnea (anginal equivalent).  Labs pending this AM.  Cardiac rehab to see and ambulate.  Plan d/c this AM provided that labs wnl.  Cont plavix, statin, bb, arb.  No ASA 2/2 allergy.  2.  HTN:  Stable.  3. HL:  Cont statin.    4.  COPD:  No active wheezing.  Cont home meds.  Signed, Nicolasa Ducking NP Patient seen and examined and history reviewed. Agree with above findings and plan. Doing well post PCI. Plan DC today if labs ok.  Theron Arista [facility] 09/27/2012 7:22 AM

## 2012-09-27 NOTE — Discharge Summary (Signed)
Patient ID: Johnathan Strong,  MRN: [id number], DOB/AGE: 1923-04-08 79 y.o.  Admit date: 09/26/2012 Discharge date: 09/27/2012  Primary Care Provider: Rudi Heap Primary Cardiologist: P. Sw[place]il[place], MD   Discharge Diagnoses Principal Problem:   Unstable angina  **s/p PCI/DES to the proximal LAD this admission.  Active Problems:   CAD (coronary artery disease)   COPD GOLD III   Hypertension   Chronic lymphoblastic leukemia   Dyslipidemia  Allergies Allergies  Allergen Reactions  . Aspirin Anaphylaxis, Shortness Of Breath and Swelling  . Nsaids Anaphylaxis, Shortness Of Breath and Swelling  . Benicar (Olmesartan Medoxomil)     dizzy  . Salicylates     SOB  . Ampicillin Rash   Procedures  Percutaneous Coronary Intervention 7.24.2014  Successful PCI and stent placement to the proximal LAD using a 3.0 x 15mm Promus DES.  History of Present Illness  79 year old male who was recently seen in clinic secondary to dyspnea exertion. He subsequently underwent stress testing which revealed inferior wall ischemia. Catheterization performed 08/27/2012, revealed occlusion of the ostial right coronary artery with excellent left to right collateral flow. He also had high-grade proximal LAD stenosis and normal LV function. He was placed on antianginal therapy and early followup was arranged to discuss treatment options. When he was seen in clinic on July 11, he continued to report activity limiting dyspnea exertion and decision was made to pursue percutaneous intervention of the LAD.  Hospital Course  Patient presented to the [facility] cardiac catheterization laboratory on 09/26/2012. He underwent successful PCI and drug-eluting stent placement to the proximal LAD using a 3.0 x 15 mm Promus drug-eluting stent via a right radial approach. Patient tolerated procedure well and post procedure has been doing without difficulty or recurrent dyspnea. We'll plan to discharge him home today in good  condition.  Discharge Vitals Blood pressure 143/41, pulse 62, temperature 97.5 F (36.4 C), temperature source Oral, resp. rate 20, height 5\' 9"  (1.753 m), weight 167 lb 8.8 oz (76 kg), SpO2 97.00%.  Filed Weights   09/26/12 0914 09/27/12 0500  Weight: 168 lb (76.204 kg) 167 lb 8.8 oz (76 kg)   Labs  CBC  Recent Labs  09/27/12 0705  WBC 8.2  HGB 11.3*  HCT 33.0*  MCV 95.1  PLT 157   Basic Metabolic Panel  Recent Labs  09/27/12 0705  NA 141  K 3.7  CL 103  CO2 26  GLUCOSE 153*  BUN 16  CREATININE 1.14  CALCIUM 9.1    Disposition  Pt is being discharged home today in good condition.  Follow-up Plans & Appointments      Follow-up Information   Follow up with Rudi Heap, MD On 10/21/2012. (10:00 AM)    Contact information:   [address] [place] [address] [phone]       Discharge Medications    Medication List         amLODipine 5 MG tablet  Commonly known as:  NORVASC  Take 7.5 mg by mouth daily.     ASTELIN NA  Place 1 spray into the nose as needed (for congestion).     atorvastatin 10 MG tablet  Commonly known as:  LIPITOR  Take 10 mg by mouth daily.     budesonide-formoterol 160-4.5 MCG/ACT inhaler  Commonly known as:  SYMBICORT  Inhale 2 puffs into the lungs 2 (two) times daily.     carvedilol 12.5 MG tablet  Commonly known as:  COREG  Take 12.5 mg by  mouth Twice daily.     clopidogrel 75 MG tablet  Commonly known as:  PLAVIX  Take 1 tablet (75 mg total) by mouth daily.     diphenhydramine-acetaminophen 25-500 MG Tabs  Commonly known as:  TYLENOL PM  Take 1 tablet by mouth at bedtime as needed. sleep     Fish Oil 1200 MG Caps  Take 1 capsule by mouth daily.     fluticasone 50 MCG/ACT nasal spray  Commonly known as:  FLONASE  Place 2 sprays into the nose as needed for allergies.     furosemide 20 MG tablet  Commonly known as:  LASIX  Take 20 mg by mouth daily as needed for edema.     losartan 50 MG tablet    Commonly known as:  COZAAR  Take 50 mg by mouth daily.     montelukast 10 MG tablet  Commonly known as:  SINGULAIR  Take 10 mg by mouth at bedtime.     nitroGLYCERIN 0.4 MG SL tablet  Commonly known as:  NITROSTAT  Place 1 tablet (0.4 mg total) under the tongue every 5 (five) minutes as needed for chest pain.     testosterone cypionate 100 MG/ML injection  Commonly known as:  DEPOTESTOTERONE CYPIONATE  Inject 200 mg into the muscle every 28 (twenty-eight) days. For IM use only     tiotropium 18 MCG inhalation capsule  Commonly known as:  SPIRIVA  Place 18 mcg into inhaler and inhale daily.     traMADol 50 MG tablet  Commonly known as:  ULTRAM  Take 50 mg by mouth 2 (two) times daily.     VENTOLIN HFA 108 (90 BASE) MCG/ACT inhaler  Generic drug:  albuterol  Inhale 2 puffs into the lungs every 6 (six) hours as needed for wheezing.     Vitamin D3 1000 UNITS Caps  Take 1 capsule by mouth daily.       Outstanding Labs/Studies  None  Duration of Discharge Encounter   Greater than 30 minutes including physician time.  Signed, Nicolasa Ducking NP 09/27/2012, 8:46 AM

## 2012-09-27 NOTE — Discharge Summary (Signed)
Patient seen and examined and history reviewed. Agree with above findings and plan. See prior rounding note.  Johnathan Strong 09/27/2012 9:24 AM

## 2012-09-27 NOTE — Progress Notes (Addendum)
CARDIAC REHAB PHASE I   PRE:  Rate/Rhythm: 62   BP:  Sitting: 136/44       SaO2: 94 RA  MODE:  Ambulation: 1000 ft   POST:  Rate/Rhythm: 69   BP:  Sitting: 161/73     SaO2: 91 RA  8:25AM-9:25AM Patient tolerated walk well.  Patient was steady on his feet and did not need much assistance.  SAT was 94% resting and 91% after walk.  Patient was encouraged to purchase a pulse oximeter while walking on his own at home to monitor oxygen saturation. Patient is interested in cardiac rehab at [facility] because some of his friends attend.   Theresa Duty, [place] 09/27/2012 9:22 AM

## 2012-10-02 ENCOUNTER — Telehealth: Payer: Self-pay | Admitting: Internal Medicine

## 2012-10-02 NOTE — Telephone Encounter (Signed)
Called patient x3 and lm for return appointment. No return call back. Sent letter 10/02/12. °

## 2012-10-04 ENCOUNTER — Encounter: Payer: Self-pay | Admitting: Cardiology

## 2012-10-04 ENCOUNTER — Ambulatory Visit (INDEPENDENT_AMBULATORY_CARE_PROVIDER_SITE_OTHER): Payer: Medicare Other | Admitting: Cardiology

## 2012-10-04 VITALS — BP 154/60 | HR 62 | Ht 69.0 in | Wt 167.1 lb

## 2012-10-04 DIAGNOSIS — R0989 Other specified symptoms and signs involving the circulatory and respiratory systems: Secondary | ICD-10-CM

## 2012-10-04 DIAGNOSIS — I119 Hypertensive heart disease without heart failure: Secondary | ICD-10-CM

## 2012-10-04 DIAGNOSIS — R0609 Other forms of dyspnea: Secondary | ICD-10-CM

## 2012-10-04 DIAGNOSIS — I2510 Atherosclerotic heart disease of native coronary artery without angina pectoris: Secondary | ICD-10-CM

## 2012-10-04 NOTE — Progress Notes (Addendum)
Johnathan Strong Date of Birth: April 26, 1923 Medical Record #[id number]  History of Present Illness: Johnathan Strong is seen today for followup after recent stenting of the proximal LAD with a DES.Johnathan Strong He presented in April with symptoms of dyspnea on exertion. Subsequent Myoview study showed significant inferior wall ischemia. He underwent cardiac catheterization on 08/27/2012. This demonstrated ostial occlusion of the right coronary with excellent left to right collateral flow. He had a high-grade proximal LAD stenosis. LV function was well-preserved.  Patient does have a history of COPD Gold stage III, CLL, TIA, and hypertension. He is aspirin allergic. He was loaded with Plavix and underwent stenting of the LAD on July 24. He had no significant complications. He states that his breathing is already better and he is using his inhalers less. He does note some orthostatic dizziness and reduced his carvedilol by half.  Current Outpatient Prescriptions on File Prior to Visit  Medication Sig Dispense Refill  . albuterol (VENTOLIN HFA) 108 (90 BASE) MCG/ACT inhaler Inhale 2 puffs into the lungs every 6 (six) hours as needed for wheezing.       Johnathan Strong amLODipine (NORVASC) 5 MG tablet Take 7.5 mg by mouth daily.      Johnathan Strong atorvastatin (LIPITOR) 10 MG tablet Take 10 mg by mouth daily.      . Azelastine HCl (ASTELIN NA) Place 1 spray into the nose as needed (for congestion).       . budesonide-formoterol (SYMBICORT) 160-4.5 MCG/ACT inhaler Inhale 2 puffs into the lungs 2 (two) times daily.      . carvedilol (COREG) 12.5 MG tablet Take 12.5 mg by mouth as directed. Pt stated he takes 1 tablet at bed time and 1/2 tablet in the morning      . Cholecalciferol (VITAMIN D3) 1000 UNITS CAPS Take 1 capsule by mouth daily.       . clopidogrel (PLAVIX) 75 MG tablet Take 1 tablet (75 mg total) by mouth daily.  90 tablet  3  . diphenhydramine-acetaminophen (TYLENOL PM) 25-500 MG TABS Take 1 tablet by mouth at bedtime as needed. sleep       . fluticasone (FLONASE) 50 MCG/ACT nasal spray Place 2 sprays into the nose as needed for allergies.       . furosemide (LASIX) 20 MG tablet Take 20 mg by mouth daily as needed for edema.       Johnathan Strong losartan (COZAAR) 50 MG tablet Take 50 mg by mouth daily.      . montelukast (SINGULAIR) 10 MG tablet Take 10 mg by mouth at bedtime.       . nitroGLYCERIN (NITROSTAT) 0.4 MG SL tablet Place 1 tablet (0.4 mg total) under the tongue every 5 (five) minutes as needed for chest pain.  25 tablet  3  . Omega-3 Fatty Acids (FISH OIL) 1200 MG CAPS Take 1 capsule by mouth daily.       Johnathan Strong testosterone cypionate (DEPOTESTOTERONE CYPIONATE) 100 MG/ML injection Inject 200 mg into the muscle every 28 (twenty-eight) days. For IM use only      . tiotropium (SPIRIVA) 18 MCG inhalation capsule Place 18 mcg into inhaler and inhale daily.      . traMADol (ULTRAM) 50 MG tablet Take 50 mg by mouth 2 (two) times daily.        No current facility-administered medications on file prior to visit.    Allergies  Allergen Reactions  . Aspirin Anaphylaxis, Shortness Of Breath and Swelling  . Nsaids Anaphylaxis, Shortness Of Breath and Swelling  .  Benicar (Olmesartan Medoxomil)     dizzy  . Salicylates     SOB  . Ampicillin Rash    Past Medical History  Diagnosis Date  . Osteopenia 2006  . Hyperlipidemia   . BPH (benign prostatic hypertrophy)   . Transient global amnesia   . Osteoarthritis   . History of TIA (transient ischemic attack) 1980'S    NO RESIDUAL  . COPD (chronic obstructive pulmonary disease) with emphysema   . Chronic back pain   . Itching SECONDARY TO CLL-- CONTROLLED W/ SINGULAIR  . Frequency   . Nocturia   . Hypertension CARDIOLOGIST- DR BRACKBILL-- LAST VISIT 12-15-2010 NOTE IN EPIC  . CLL (chronic lymphocytic leukemia) LAST PLT COUNT APRL 2012  168    Managed at [facility]  BY DR [facility]  . Bladder cancer   . Aspirin allergy     Eyes swell  . Asthma   . Dyspnea on exertion   . CAD (coronary  artery disease)     a. 08/2012 Cath: LM nl, LAD 90p, LCX 20-30, RCA 100ost fills via L->R collats, EF 55-65%;  b. 09/2012 PCI of prox LAD with 3.0x16 Promus DES.    Past Surgical History  Procedure Laterality Date  . Excisional hemorrhoidectomy  1980  . Cardiovascular stress test  07/2005    a. 07/2005- no reversible ischemia, normal EF b. 06/2009- EF 73%, no reversible ischemia, inferolateral TWIs at rest, upright in recovery c. 08/2012- mediuim-sized partially reversible basal to mid inferior and inferoseptal perfusion defect c/w with prior infarction and peri-infarct ischemia; EF 68% and borderline ST/T changes on stress ECG   . Transurethral resection of bladder tumor  10-19-2008    AND DILATION URETHRAL STRICTURE  . Cataract extraction w/ intraocular lens  implant, bilateral Bilateral ~ 2007  . Transthoracic echocardiogram  01-03-2011    LVEF 60-65%, grade 1 diastolic dysfunction, no WMAs or structural abnormalities  . Cystoscopy with biopsy  10/30/2011    Procedure: CYSTOSCOPY WITH BIOPSY;  Surgeon: Garnett Farm, MD;  [facility]: [facility];  Service: Urology;  Laterality: N/A;  . Cardiac catheterization  08/27/2012  . Coronary angioplasty with stent placement  09/26/2012    "1" (09/26/2012)    History  Smoking status  . Former Smoker -- 1.00 packs/day for 25 years  . Types: Cigarettes  . Quit date: 03/06/1958  Smokeless tobacco  . Former Neurosurgeon  . Quit date: 10/25/1978    History  Alcohol Use  . Yes    Comment: 09/26/2012 "glass of wine or mixed drink once/month"    Family History  Problem Relation Age of Onset  . Emphysema Father   . Heart disease Father   . COPD Father   . Atrial fibrillation Father   . Kidney cancer Mother   . Kidney failure Mother     Review of Systems: As noted in history of present illness.  All other systems were reviewed and are negative.  Physical Exam: BP 154/60  Pulse 62  Ht 5\' 9"  (1.753 m)  Wt 167 lb 1.9 oz (75.805 kg)   BMI 24.67 kg/m2  SpO2 96% He is a pleasant elderly white male in no acute distress. HEENT: Normal. Neck is supple no JVD, adenopathy, thyromegaly, or bruits. Lungs: Clear to auscultation and percussion. No wheezes. Cardiovascular: Regular rate and rhythm, normal S1 and S2, no gallop, murmur, or click. Abdomen: Soft and nontender. No hepatosplenomegaly or masses. Bowel sounds are positive. Extremities: No cyanosis, clubbing, or edema. Pedal pulses  are 2+. Radial pulses are 2+. No radial hematoma. Skin: Warm and dry Neuro: Alert and oriented x3. Cranial nerves II through XII are intact. LABORATORY DATA: ECG today demonstrates sinus bradycardia with a rate of 58 beats per minute. He has a first degree AV block and nonspecific T-wave abnormality.  Assessment / Plan: 1. Coronary disease . Status post DES of the proximal LAD. He has chronic total occlusion of the ostium of the right coronary with excellent collateral flow. Symptomatically he is improved with less dyspnea. We will continue Plavix indefinitely. He is aspirin allergic. I've encouraged him to participate in cardiac rehabilitation. I'll have him followup again in 3 months with Dr. Patty Sermons. 2. COPD. 3. CLL. 4. Aspirin allergy. Patient has been placed on Plavix and P2Y12 assay was consistent with excellent response at 79 P-R U. 5. Orthostatic dizziness. We will continue with the lower dose of carvedilol for now and monitor his blood pressure.

## 2012-10-04 NOTE — Patient Instructions (Signed)
Reduce your coreg by half.  Continue your other therapy  We will schedule you a follow up with Dr. Patty Sermons in 3 months.

## 2012-10-07 ENCOUNTER — Encounter (HOSPITAL_COMMUNITY)
Admission: RE | Admit: 2012-10-07 | Discharge: 2012-10-07 | Disposition: A | Discharge status: Home / Self Care | Payer: Medicare Other | Source: Ambulatory Visit | Attending: Cardiology | Admitting: Cardiology

## 2012-10-07 ENCOUNTER — Encounter (HOSPITAL_COMMUNITY): Payer: Self-pay

## 2012-10-07 VITALS — BP 130/58 | HR 57 | Ht 68.0 in | Wt 169.0 lb

## 2012-10-07 DIAGNOSIS — Z5189 Encounter for other specified aftercare: Secondary | ICD-10-CM | POA: Insufficient documentation

## 2012-10-07 DIAGNOSIS — I200 Unstable angina: Secondary | ICD-10-CM | POA: Insufficient documentation

## 2012-10-07 DIAGNOSIS — Z9861 Coronary angioplasty status: Secondary | ICD-10-CM | POA: Insufficient documentation

## 2012-10-07 NOTE — Patient Instructions (Signed)
Pt has finished orientation and is scheduled to start CR on 10/14/12 at 11 am. Pt has been instructed to arrive to class 15 minutes early for scheduled class. Pt has been instructed to wear comfortable clothing and shoes with rubber soles. Pt has been told to take their medications 1 hour prior to coming to class.  If the patient is not going to attend class, he/she has been instructed to call.   

## 2012-10-07 NOTE — Progress Notes (Signed)
Patient was referred to Cardiac Rehab by Dr. Peter Swaziland due to Unstable angina and coronary stent placement V85.42. During orientation advised patient on arrival and appointment times what to wear, what to do before, during and after exercise. Reviewed attendance and class policy. Talked about inclement weather and class consultation policy. Pt is scheduled to start Cardiac Rehab on 10/14/12 at 11 am.  Pt was advised to come to class 5 minutes before class starts. He was also given instructions on meeting with the dietician and attending the Family Structure classes. Pt is eager to get started.

## 2012-10-08 ENCOUNTER — Ambulatory Visit (INDEPENDENT_AMBULATORY_CARE_PROVIDER_SITE_OTHER): Payer: Medicare Other | Admitting: *Deleted

## 2012-10-08 DIAGNOSIS — E349 Endocrine disorder, unspecified: Secondary | ICD-10-CM

## 2012-10-08 DIAGNOSIS — E291 Testicular hypofunction: Secondary | ICD-10-CM

## 2012-10-08 MED ORDER — TESTOSTERONE CYPIONATE 200 MG/ML IM SOLN
200.0000 mg | INTRAMUSCULAR | Status: DC
  Administered 2012-10-08 – 2013-06-16 (×9): 200 mg via INTRAMUSCULAR

## 2012-10-08 NOTE — Progress Notes (Signed)
Patient ID: Johnathan Strong, male   DOB: 1923/08/25, 79 y.o.   MRN: [id number] Due to pt having recent stent placement, spoke with the pharmacist Johnathan Strong at [facility] and with [facility] and both confirmed there is no contraindication to prevent pt from getting his testosterone injection today. Pt tolerated injection well.

## 2012-10-14 ENCOUNTER — Encounter (HOSPITAL_COMMUNITY)
Admission: RE | Admit: 2012-10-14 | Discharge: 2012-10-14 | Disposition: A | Discharge status: Home / Self Care | Payer: Medicare Other | Source: Ambulatory Visit | Attending: Cardiology | Admitting: Cardiology

## 2012-10-16 ENCOUNTER — Encounter (HOSPITAL_COMMUNITY)
Admission: RE | Admit: 2012-10-16 | Discharge: 2012-10-16 | Disposition: A | Discharge status: Home / Self Care | Payer: Medicare Other | Source: Ambulatory Visit | Attending: Cardiology | Admitting: Cardiology

## 2012-10-18 ENCOUNTER — Encounter (HOSPITAL_COMMUNITY)
Admission: RE | Admit: 2012-10-18 | Discharge: 2012-10-18 | Disposition: A | Discharge status: Home / Self Care | Payer: Medicare Other | Source: Ambulatory Visit | Attending: Cardiology | Admitting: Cardiology

## 2012-10-18 ENCOUNTER — Other Ambulatory Visit (INDEPENDENT_AMBULATORY_CARE_PROVIDER_SITE_OTHER): Payer: Medicare Other

## 2012-10-18 DIAGNOSIS — E349 Endocrine disorder, unspecified: Secondary | ICD-10-CM

## 2012-10-18 DIAGNOSIS — E291 Testicular hypofunction: Secondary | ICD-10-CM

## 2012-10-21 ENCOUNTER — Encounter (HOSPITAL_COMMUNITY): Payer: Medicare Other

## 2012-10-21 ENCOUNTER — Ambulatory Visit (INDEPENDENT_AMBULATORY_CARE_PROVIDER_SITE_OTHER): Payer: Medicare Other | Admitting: Family Medicine

## 2012-10-21 ENCOUNTER — Encounter: Payer: Self-pay | Admitting: Family Medicine

## 2012-10-21 VITALS — BP 118/49 | HR 55 | Temp 97.0°F | Ht 69.0 in | Wt 168.6 lb

## 2012-10-21 DIAGNOSIS — I2510 Atherosclerotic heart disease of native coronary artery without angina pectoris: Secondary | ICD-10-CM

## 2012-10-21 DIAGNOSIS — IMO0001 Reserved for inherently not codable concepts without codable children: Secondary | ICD-10-CM

## 2012-10-21 DIAGNOSIS — E785 Hyperlipidemia, unspecified: Secondary | ICD-10-CM

## 2012-10-21 DIAGNOSIS — Z79899 Other long term (current) drug therapy: Secondary | ICD-10-CM

## 2012-10-21 DIAGNOSIS — C9100 Acute lymphoblastic leukemia not having achieved remission: Secondary | ICD-10-CM

## 2012-10-21 DIAGNOSIS — E349 Endocrine disorder, unspecified: Secondary | ICD-10-CM

## 2012-10-21 DIAGNOSIS — I10 Essential (primary) hypertension: Secondary | ICD-10-CM

## 2012-10-21 DIAGNOSIS — E291 Testicular hypofunction: Secondary | ICD-10-CM

## 2012-10-21 LAB — POCT CBC
Granulocyte percent: 37.1 %G (ref 37–80)
HCT, POC: 35.9 % — AB (ref 43.5–53.7)
MCV: 96 fL (ref 80–97)
RBC: 3.7 M/uL — AB (ref 4.69–6.13)
WBC: 8.5 10*3/uL (ref 4.6–10.2)

## 2012-10-21 NOTE — Progress Notes (Signed)
Subjective:    Patient ID: Johnathan Strong, male    DOB: 02-24-1924, 79 y.o.   MRN: [id number]  HPI Patient returns to clinic today for followup and management of chronic medical problems. These include hypertension, hyperlipidemia, coronary artery disease, and testosterone deficiency, and chronic lymphocytic leukemia. He is followed yearly for the chronic lymphocytic leukemia at the by Dr. Kristine Strong. Dr. Swazil[place] and has been seeing him recently for his coronary artery disease. He is also followed by Dr. Vernie Strong for his prostate problems. His health maintenance issues are up-to-date   Review of Systems  Constitutional: Positive for activity change (increased, exercising daily) and fatigue. Negative for appetite change.  HENT: Negative.  Negative for congestion, sore throat, rhinorrhea, postnasal drip and sinus pressure.   Eyes: Positive for visual disturbance (will schedule eye exam). Negative for pain, discharge, redness and itching.  Respiratory: Positive for shortness of breath (slight but improved since heart surgery). Negative for cough and wheezing.   Cardiovascular: Negative for chest pain, palpitations and leg swelling.  Gastrointestinal: Negative for nausea, vomiting, abdominal pain, diarrhea, constipation and blood in stool.  Endocrine: Negative.  Negative for cold intolerance, heat intolerance, polydipsia, polyphagia and polyuria.  Genitourinary: Negative for dysuria, urgency, frequency and hematuria.  Musculoskeletal: Positive for back pain (R LBP after fall in June). Negative for myalgias and arthralgias.  Skin: Negative.  Negative for color change, pallor, rash and wound.  Allergic/Immunologic: Positive for environmental allergies (seasonal controlled by meds).  Neurological: Positive for light-headedness. Negative for dizziness, tremors, syncope, weakness and headaches.  Hematological: Bruises/bleeds easily.  Psychiatric/Behavioral: Positive for sleep disturbance. Negative for  confusion, decreased concentration and agitation. The patient is not nervous/anxious.        Objective:   Physical Exam BP 118/49  Pulse 55  Temp(Src) 97 F (36.1 C) (Oral)  Ht 5\' 9"  (1.753 m)  Wt 168 lb 9.6 oz (76.476 kg)  BMI 24.89 kg/m2  The patient appeared well nourished and normally developed for his age, alert and oriented to time and place. Speech, behavior and judgement appear normal. Vital signs as documented.  Head exam is unremarkable. No scleral icterus or pallor noted. Ears nose and throat were within normal limit.  Neck is without jugular venous distension, thyromegally, or carotid bruits. Carotid upstrokes are brisk bilaterally. No cervical adenopathy. Lungs are clear anteriorly and posteriorly to auscultation. Normal respiratory effort. Cardiac exam reveals regular rate and rhythm at 72 per minute. First and second heart sounds normal.  No murmurs, rubs or gallops.  Abdominal exam reveals normal bowl sounds, no masses, no organomegaly and no aortic enlargement. No inguinal adenopathy. There is no abdominal tenderness but there was some gaseous distention. Extremities are nonedematous and both femoral  pulses are normal. Skin without pallor or jaundice.  Warm and dry, without rash. There was some bruising on his upper extremity. Neurologic exam reveals normal deep tendon reflexes and normal sensation.   Additional labs will be drawn today as lab work that was sent out on Friday did not have the labs that were needed to monitor all his conditions.       Assessment & Plan:  1. Hypertension  2. Hyperlipemia  3. Testosterone deficiency  4. High risk medication use  5. CAD (coronary artery disease)  6. Chronic lymphoblastic leukemia  7. Bladder cancer -Followed by Dr. Vernie Strong  Patient Instructions  Fall precautions discussed Continue current meds and therapeutic lifestyle changes Return to clinic in September or October for a flu shot  Always drink  plenty of fluids Continue followup with specialists   Johnathan Capes MD

## 2012-10-21 NOTE — Addendum Note (Signed)
Addended by: Tommas Olp on: 10/21/2012 12:05 PM   Modules accepted: Orders

## 2012-10-21 NOTE — Patient Instructions (Addendum)
Fall precautions discussed Continue current meds and therapeutic lifestyle changes Return to clinic in September or October for a flu shot  Always drink plenty of fluids Continue followup with specialists

## 2012-10-22 LAB — TESTOSTERONE, TOTAL AND FREE DIRECT MEASURE: Free Testosterone, Direct: 3.2 pg/mL — ABNORMAL LOW (ref 3.8–34.2)

## 2012-10-23 ENCOUNTER — Encounter (HOSPITAL_COMMUNITY)
Admission: RE | Admit: 2012-10-23 | Discharge: 2012-10-23 | Disposition: A | Discharge status: Home / Self Care | Payer: Medicare Other | Source: Ambulatory Visit | Attending: Cardiology | Admitting: Cardiology

## 2012-10-23 LAB — NMR, LIPOPROFILE
HDL Cholesterol by NMR: 39 mg/dL — ABNORMAL LOW (ref >=40)
HDL Particle Number: 29.5 umol/L — ABNORMAL LOW (ref >=30.5)
LP-IR Score: 40 (ref <=45)
Small LDL Particle Number: 422 nmol/L (ref <=527)

## 2012-10-23 LAB — HEPATIC FUNCTION PANEL
AST: 16 IU/L (ref 0–40)
Albumin: 4.9 g/dL — ABNORMAL HIGH (ref 3.5–4.7)
Alkaline Phosphatase: 58 IU/L (ref 39–117)
Bilirubin, Direct: 0.31 mg/dL (ref 0.00–0.40)
Total Bilirubin: 1 mg/dL (ref 0.0–1.2)

## 2012-10-23 LAB — BMP8+EGFR
BUN/Creatinine Ratio: 12 (ref 10–22)
BUN: 14 mg/dL (ref 8–27)
Creatinine, Ser: 1.13 mg/dL (ref 0.76–1.27)
GFR calc Af Amer: 67 mL/min/{1.73_m2} (ref >59)
GFR calc non Af Amer: 58 mL/min/{1.73_m2} — ABNORMAL LOW (ref >59)

## 2012-10-23 LAB — VITAMIN D 25 HYDROXY (VIT D DEFICIENCY, FRACTURES): Vit D, 25-Hydroxy: 29.6 ng/mL — ABNORMAL LOW (ref 30.0–100.0)

## 2012-10-24 ENCOUNTER — Encounter: Payer: Self-pay | Admitting: Family Medicine

## 2012-10-25 ENCOUNTER — Encounter (HOSPITAL_COMMUNITY)
Admission: RE | Admit: 2012-10-25 | Discharge: 2012-10-25 | Disposition: A | Discharge status: Home / Self Care | Payer: Medicare Other | Source: Ambulatory Visit | Attending: Cardiology | Admitting: Cardiology

## 2012-10-28 ENCOUNTER — Encounter (HOSPITAL_COMMUNITY)
Admission: RE | Admit: 2012-10-28 | Discharge: 2012-10-28 | Disposition: A | Discharge status: Home / Self Care | Payer: Medicare Other | Source: Ambulatory Visit | Attending: Cardiology | Admitting: Cardiology

## 2012-10-29 ENCOUNTER — Telehealth: Payer: Self-pay | Admitting: *Deleted

## 2012-10-29 DIAGNOSIS — E875 Hyperkalemia: Secondary | ICD-10-CM

## 2012-10-29 NOTE — Telephone Encounter (Signed)
Pt notified of results Will come in for repeat BMP 

## 2012-10-29 NOTE — Telephone Encounter (Signed)
LMOM

## 2012-10-30 ENCOUNTER — Encounter (HOSPITAL_COMMUNITY)
Admission: RE | Admit: 2012-10-30 | Discharge: 2012-10-30 | Disposition: A | Discharge status: Home / Self Care | Payer: Medicare Other | Source: Ambulatory Visit | Attending: Cardiology | Admitting: Cardiology

## 2012-11-01 ENCOUNTER — Encounter (HOSPITAL_COMMUNITY)
Admission: RE | Admit: 2012-11-01 | Discharge: 2012-11-01 | Disposition: A | Discharge status: Home / Self Care | Payer: Medicare Other | Source: Ambulatory Visit | Attending: Cardiology | Admitting: Cardiology

## 2012-11-04 ENCOUNTER — Encounter (HOSPITAL_COMMUNITY): Payer: Medicare Other

## 2012-11-06 ENCOUNTER — Encounter (HOSPITAL_COMMUNITY)
Admission: RE | Admit: 2012-11-06 | Discharge: 2012-11-06 | Disposition: A | Discharge status: Home / Self Care | Payer: Medicare Other | Source: Ambulatory Visit | Attending: Cardiology | Admitting: Cardiology

## 2012-11-06 DIAGNOSIS — Z5189 Encounter for other specified aftercare: Secondary | ICD-10-CM | POA: Insufficient documentation

## 2012-11-06 DIAGNOSIS — Z9861 Coronary angioplasty status: Secondary | ICD-10-CM | POA: Insufficient documentation

## 2012-11-06 DIAGNOSIS — I200 Unstable angina: Secondary | ICD-10-CM | POA: Insufficient documentation

## 2012-11-06 NOTE — Progress Notes (Signed)
Patient came in for labs only.

## 2012-11-08 ENCOUNTER — Encounter (HOSPITAL_COMMUNITY)
Admission: RE | Admit: 2012-11-08 | Discharge: 2012-11-08 | Disposition: A | Discharge status: Home / Self Care | Payer: Medicare Other | Source: Ambulatory Visit | Attending: Cardiology | Admitting: Cardiology

## 2012-11-08 ENCOUNTER — Ambulatory Visit (INDEPENDENT_AMBULATORY_CARE_PROVIDER_SITE_OTHER): Payer: Medicare Other | Admitting: *Deleted

## 2012-11-08 DIAGNOSIS — E349 Endocrine disorder, unspecified: Secondary | ICD-10-CM

## 2012-11-08 DIAGNOSIS — E291 Testicular hypofunction: Secondary | ICD-10-CM

## 2012-11-11 ENCOUNTER — Encounter (HOSPITAL_COMMUNITY)
Admission: RE | Admit: 2012-11-11 | Discharge: 2012-11-11 | Disposition: A | Discharge status: Home / Self Care | Payer: Medicare Other | Source: Ambulatory Visit | Attending: Cardiology | Admitting: Cardiology

## 2012-11-13 ENCOUNTER — Encounter (HOSPITAL_COMMUNITY)
Admission: RE | Admit: 2012-11-13 | Discharge: 2012-11-13 | Disposition: A | Discharge status: Home / Self Care | Payer: Medicare Other | Source: Ambulatory Visit | Attending: Cardiology | Admitting: Cardiology

## 2012-11-15 ENCOUNTER — Encounter (HOSPITAL_COMMUNITY)
Admission: RE | Admit: 2012-11-15 | Discharge: 2012-11-15 | Disposition: A | Discharge status: Home / Self Care | Payer: Medicare Other | Source: Ambulatory Visit | Attending: Cardiology | Admitting: Cardiology

## 2012-11-18 ENCOUNTER — Encounter (HOSPITAL_COMMUNITY)
Admission: RE | Admit: 2012-11-18 | Discharge: 2012-11-18 | Disposition: A | Discharge status: Home / Self Care | Payer: Medicare Other | Source: Ambulatory Visit | Attending: Cardiology | Admitting: Cardiology

## 2012-11-20 ENCOUNTER — Encounter (HOSPITAL_COMMUNITY)
Admission: RE | Admit: 2012-11-20 | Discharge: 2012-11-20 | Disposition: A | Discharge status: Home / Self Care | Payer: Medicare Other | Source: Ambulatory Visit | Attending: Cardiology | Admitting: Cardiology

## 2012-11-22 ENCOUNTER — Encounter (HOSPITAL_COMMUNITY)
Admission: RE | Admit: 2012-11-22 | Discharge: 2012-11-22 | Disposition: A | Discharge status: Home / Self Care | Payer: Medicare Other | Source: Ambulatory Visit | Attending: Cardiology | Admitting: Cardiology

## 2012-11-25 ENCOUNTER — Other Ambulatory Visit: Payer: Self-pay | Admitting: Family Medicine

## 2012-11-25 ENCOUNTER — Encounter (HOSPITAL_COMMUNITY)
Admission: RE | Admit: 2012-11-25 | Discharge: 2012-11-25 | Disposition: A | Discharge status: Home / Self Care | Payer: Medicare Other | Source: Ambulatory Visit | Attending: Cardiology | Admitting: Cardiology

## 2012-11-27 ENCOUNTER — Encounter (HOSPITAL_COMMUNITY)
Admission: RE | Admit: 2012-11-27 | Discharge: 2012-11-27 | Disposition: A | Discharge status: Home / Self Care | Payer: Medicare Other | Source: Ambulatory Visit | Attending: Cardiology | Admitting: Cardiology

## 2012-11-27 ENCOUNTER — Other Ambulatory Visit: Payer: Self-pay | Admitting: *Deleted

## 2012-11-27 NOTE — Telephone Encounter (Signed)
Pt aware to pick up rx 

## 2012-11-27 NOTE — Telephone Encounter (Signed)
The tramadol is okay for 3 month

## 2012-11-27 NOTE — Telephone Encounter (Signed)
LAST RF ON ULTRAM 07/27/12. LAST OV 10/21/12. PRINT AND CALL PT TO PICKUP.

## 2012-11-29 ENCOUNTER — Encounter (HOSPITAL_COMMUNITY)
Admission: RE | Admit: 2012-11-29 | Discharge: 2012-11-29 | Disposition: A | Discharge status: Home / Self Care | Payer: Medicare Other | Source: Ambulatory Visit | Attending: Cardiology | Admitting: Cardiology

## 2012-12-02 ENCOUNTER — Encounter (HOSPITAL_COMMUNITY)
Admission: RE | Admit: 2012-12-02 | Discharge: 2012-12-02 | Disposition: A | Discharge status: Home / Self Care | Payer: Medicare Other | Source: Ambulatory Visit | Attending: Cardiology | Admitting: Cardiology

## 2012-12-04 ENCOUNTER — Encounter (HOSPITAL_COMMUNITY): Payer: Medicare Other

## 2012-12-04 ENCOUNTER — Other Ambulatory Visit: Payer: Self-pay | Admitting: Family Medicine

## 2012-12-05 NOTE — Telephone Encounter (Signed)
This medicine is okay for this patient and may be refilled for up to 6 months

## 2012-12-05 NOTE — Telephone Encounter (Signed)
Patient last seen in office on 10-21-12 by [facility]. Rx last filled on 07-27-12 for #[id number]. Please advise. If approved please print and route to Pool A so nurse can call pt to pick up

## 2012-12-06 ENCOUNTER — Encounter (HOSPITAL_COMMUNITY): Payer: Medicare Other

## 2012-12-09 ENCOUNTER — Encounter (HOSPITAL_COMMUNITY)
Admission: RE | Admit: 2012-12-09 | Discharge: 2012-12-09 | Disposition: A | Discharge status: Home / Self Care | Payer: Medicare Other | Source: Ambulatory Visit | Attending: Cardiology | Admitting: Cardiology

## 2012-12-09 DIAGNOSIS — I200 Unstable angina: Secondary | ICD-10-CM | POA: Insufficient documentation

## 2012-12-09 DIAGNOSIS — Z9861 Coronary angioplasty status: Secondary | ICD-10-CM | POA: Insufficient documentation

## 2012-12-09 DIAGNOSIS — Z5189 Encounter for other specified aftercare: Secondary | ICD-10-CM | POA: Insufficient documentation

## 2012-12-09 NOTE — Telephone Encounter (Signed)
Pt called about med

## 2012-12-11 ENCOUNTER — Encounter (HOSPITAL_COMMUNITY)
Admission: RE | Admit: 2012-12-11 | Discharge: 2012-12-11 | Disposition: A | Discharge status: Home / Self Care | Payer: Medicare Other | Source: Ambulatory Visit | Attending: Cardiology | Admitting: Cardiology

## 2012-12-11 ENCOUNTER — Ambulatory Visit (INDEPENDENT_AMBULATORY_CARE_PROVIDER_SITE_OTHER): Payer: Medicare Other

## 2012-12-11 DIAGNOSIS — E291 Testicular hypofunction: Secondary | ICD-10-CM

## 2012-12-11 DIAGNOSIS — Z23 Encounter for immunization: Secondary | ICD-10-CM

## 2012-12-13 ENCOUNTER — Encounter (HOSPITAL_COMMUNITY)
Admission: RE | Admit: 2012-12-13 | Discharge: 2012-12-13 | Disposition: A | Discharge status: Home / Self Care | Payer: Medicare Other | Source: Ambulatory Visit | Attending: Cardiology | Admitting: Cardiology

## 2012-12-16 ENCOUNTER — Encounter (HOSPITAL_COMMUNITY)
Admission: RE | Admit: 2012-12-16 | Discharge: 2012-12-16 | Disposition: A | Discharge status: Home / Self Care | Payer: Medicare Other | Source: Ambulatory Visit | Attending: Cardiology | Admitting: Cardiology

## 2012-12-18 ENCOUNTER — Encounter (HOSPITAL_COMMUNITY)
Admission: RE | Admit: 2012-12-18 | Discharge: 2012-12-18 | Disposition: A | Discharge status: Home / Self Care | Payer: Medicare Other | Source: Ambulatory Visit | Attending: Cardiology | Admitting: Cardiology

## 2012-12-20 ENCOUNTER — Encounter (HOSPITAL_COMMUNITY)
Admission: RE | Admit: 2012-12-20 | Discharge: 2012-12-20 | Disposition: A | Discharge status: Home / Self Care | Payer: Medicare Other | Source: Ambulatory Visit | Attending: Cardiology | Admitting: Cardiology

## 2012-12-23 ENCOUNTER — Encounter (HOSPITAL_COMMUNITY)
Admission: RE | Admit: 2012-12-23 | Discharge: 2012-12-23 | Disposition: A | Discharge status: Home / Self Care | Payer: Medicare Other | Source: Ambulatory Visit | Attending: Cardiology | Admitting: Cardiology

## 2012-12-23 ENCOUNTER — Other Ambulatory Visit: Payer: Self-pay | Admitting: Family Medicine

## 2012-12-25 ENCOUNTER — Other Ambulatory Visit: Payer: Self-pay

## 2012-12-25 ENCOUNTER — Encounter (HOSPITAL_COMMUNITY)
Admission: RE | Admit: 2012-12-25 | Discharge: 2012-12-25 | Disposition: A | Discharge status: Home / Self Care | Payer: Medicare Other | Source: Ambulatory Visit | Attending: Cardiology | Admitting: Cardiology

## 2012-12-25 MED ORDER — ATORVASTATIN CALCIUM 10 MG PO TABS: 10.0000 mg | ORAL_TABLET | Freq: Every day | ORAL | Status: DC

## 2012-12-27 ENCOUNTER — Encounter (HOSPITAL_COMMUNITY)
Admission: RE | Admit: 2012-12-27 | Discharge: 2012-12-27 | Disposition: A | Discharge status: Home / Self Care | Payer: Medicare Other | Source: Ambulatory Visit | Attending: Cardiology | Admitting: Cardiology

## 2012-12-28 ENCOUNTER — Other Ambulatory Visit: Payer: Self-pay | Admitting: Family Medicine

## 2012-12-30 ENCOUNTER — Other Ambulatory Visit: Payer: Self-pay

## 2012-12-30 ENCOUNTER — Encounter (HOSPITAL_COMMUNITY): Payer: Medicare Other

## 2012-12-30 MED ORDER — ATORVASTATIN CALCIUM 10 MG PO TABS: 10.0000 mg | ORAL_TABLET | Freq: Every day | ORAL | Status: DC

## 2013-01-01 ENCOUNTER — Encounter (HOSPITAL_COMMUNITY)
Admission: RE | Admit: 2013-01-01 | Discharge: 2013-01-01 | Disposition: A | Discharge status: Home / Self Care | Payer: Medicare Other | Source: Ambulatory Visit | Attending: Cardiology | Admitting: Cardiology

## 2013-01-02 ENCOUNTER — Other Ambulatory Visit: Payer: Self-pay | Admitting: Family Medicine

## 2013-01-03 ENCOUNTER — Encounter (HOSPITAL_COMMUNITY)
Admission: RE | Admit: 2013-01-03 | Discharge: 2013-01-03 | Disposition: A | Discharge status: Home / Self Care | Payer: Medicare Other | Source: Ambulatory Visit | Attending: Cardiology | Admitting: Cardiology

## 2013-01-03 NOTE — Telephone Encounter (Signed)
Neither one of these meds are on his med list?

## 2013-01-03 NOTE — Telephone Encounter (Signed)
ADDThese medications to his med list and they are okay for one year

## 2013-01-06 ENCOUNTER — Encounter (HOSPITAL_COMMUNITY)
Admission: RE | Admit: 2013-01-06 | Discharge: 2013-01-06 | Disposition: A | Discharge status: Home / Self Care | Payer: Medicare Other | Source: Ambulatory Visit | Attending: Cardiology | Admitting: Cardiology

## 2013-01-06 DIAGNOSIS — Z9861 Coronary angioplasty status: Secondary | ICD-10-CM | POA: Insufficient documentation

## 2013-01-06 DIAGNOSIS — Z5189 Encounter for other specified aftercare: Secondary | ICD-10-CM | POA: Insufficient documentation

## 2013-01-06 DIAGNOSIS — I200 Unstable angina: Secondary | ICD-10-CM | POA: Insufficient documentation

## 2013-01-08 ENCOUNTER — Ambulatory Visit (INDEPENDENT_AMBULATORY_CARE_PROVIDER_SITE_OTHER): Payer: Medicare Other | Admitting: *Deleted

## 2013-01-08 ENCOUNTER — Encounter (HOSPITAL_COMMUNITY)
Admission: RE | Admit: 2013-01-08 | Discharge: 2013-01-08 | Disposition: A | Discharge status: Home / Self Care | Payer: Medicare Other | Source: Ambulatory Visit | Attending: Cardiology | Admitting: Cardiology

## 2013-01-08 DIAGNOSIS — E349 Endocrine disorder, unspecified: Secondary | ICD-10-CM

## 2013-01-08 DIAGNOSIS — E291 Testicular hypofunction: Secondary | ICD-10-CM

## 2013-01-08 NOTE — Progress Notes (Signed)
Patient ID: Johnathan Strong, male   DOB: [birth year], 79 y.o.   MRN: [id number] Pt tolerated injection well

## 2013-01-08 NOTE — Patient Instructions (Signed)
Testosterone injection What is this medicine? TESTOSTERONE (tes TOS ter one) is the main male hormone. It supports normal male development such as muscle growth, facial hair, and deep voice. It is used in males to treat low testosterone levels. This medicine may be used for other purposes; ask your health care provider or pharmacist if you have questions. What should I tell my health care provider before I take this medicine? They need to know if you have any of these conditions: -breast cancer -diabetes -heart disease -kidney disease -liver disease -lung disease -prostate cancer, enlargement -an unusual or allergic reaction to testosterone, other medicines, foods, dyes, or preservatives -pregnant or trying to get pregnant -breast-feeding How should I use this medicine? This medicine is for injection into a muscle. It is usually given by a health care professional in a hospital or clinic setting. Contact your pediatrician regarding the use of this medicine in children. While this medicine may be prescribed for children as young as 12 years of age for selected conditions, precautions do apply. Overdosage: If you think you have taken too much of this medicine contact a poison control center or emergency room at once. NOTE: This medicine is only for you. Do not share this medicine with others. What if I miss a dose? Try not to miss a dose. Your doctor or health care professional will tell you when your next injection is due. Notify the office if you are unable to keep an appointment. What may interact with this medicine? -medicines for diabetes -medicines that treat or prevent blood clots like warfarin -oxyphenbutazone -propranolol -steroid medicines like prednisone or cortisone This list may not describe all possible interactions. Give your health care provider a list of all the medicines, herbs, non-prescription drugs, or dietary supplements you use. Also tell them if you smoke, drink  alcohol, or use illegal drugs. Some items may interact with your medicine. What should I watch for while using this medicine? Visit your doctor or health care professional for regular checks on your progress. They will need to check the level of testosterone in your blood. This medicine may affect blood sugar levels. If you have diabetes, check with your doctor or health care professional before you change your diet or the dose of your diabetic medicine. This drug is banned from use in athletes by most athletic organizations. What side effects may I notice from receiving this medicine? Side effects that you should report to your doctor or health care professional as soon as possible: -allergic reactions like skin rash, itching or hives, swelling of the face, lips, or tongue -breast enlargement -breathing problems -changes in mood, especially anger, depression, or rage -dark urine -general ill feeling or flu-like symptoms -light-colored stools -loss of appetite, nausea -nausea, vomiting -right upper belly pain -stomach pain -swelling of ankles -too frequent or persistent erections -trouble passing urine or change in the amount of urine -unusually weak or tired -yellowing of the eyes or skin Additional side effects that can occur in women include: -deep or hoarse voice -facial hair growth -irregular menstrual periods Side effects that usually do not require medical attention (report to your doctor or health care professional if they continue or are bothersome): -acne -change in sex drive or performance -hair loss -headache This list may not describe all possible side effects. Call your doctor for medical advice about side effects. You may report side effects to FDA at 1-800-FDA-1088. Where should I keep my medicine? Keep out of the reach of children. This medicine   can be abused. Keep your medicine in a safe place to protect it from theft. Do not share this medicine with anyone.  Selling or giving away this medicine is dangerous and against the law. Store at room temperature between 20 and 25 degrees C (68 and 77 degrees F). Do not freeze. Protect from light. Follow the directions for the product you are prescribed. Throw away any unused medicine after the expiration date. NOTE: This sheet is a summary. It may not cover all possible information. If you have questions about this medicine, talk to your doctor, pharmacist, or health care provider.  2012, [employer]vier/Gold Standard. (05/03/2007 4:13:46 PM) 

## 2013-01-10 ENCOUNTER — Encounter (HOSPITAL_COMMUNITY)
Admission: RE | Admit: 2013-01-10 | Discharge: 2013-01-10 | Disposition: A | Discharge status: Home / Self Care | Payer: Medicare Other | Source: Ambulatory Visit | Attending: Cardiology | Admitting: Cardiology

## 2013-01-13 ENCOUNTER — Encounter (HOSPITAL_COMMUNITY)
Admission: RE | Admit: 2013-01-13 | Discharge: 2013-01-13 | Disposition: A | Discharge status: Home / Self Care | Payer: Medicare Other | Source: Ambulatory Visit | Attending: Cardiology | Admitting: Cardiology

## 2013-01-15 ENCOUNTER — Encounter (HOSPITAL_COMMUNITY)
Admission: RE | Admit: 2013-01-15 | Discharge: 2013-01-15 | Disposition: A | Discharge status: Home / Self Care | Payer: Medicare Other | Source: Ambulatory Visit | Attending: Cardiology | Admitting: Cardiology

## 2013-01-15 NOTE — Progress Notes (Signed)
Patient graduated from Cardiac Rehabilitation today on 01/15/13 after completing 36 sessions. He achieved LTG of 30 minutes of aerobic exercise at Max Met level of 4.0. All patients vitals are WNL. Patient has met with dietician. Discharge instruction has been reviewed in detail and patient stated an understanding of material given. Patient plans to exercise at home on his Treadmill. Cardiac Rehab staff will make f/u calls at 1 month, 6 months, and 1 year. Patient had no complaints of any abnormal S/S or pain on their exit visit. Patient was able to complete the 6 minute exit walk test. Patient plans to purchase a Nustep for home to continue his exercise regimen.

## 2013-01-17 ENCOUNTER — Encounter (HOSPITAL_COMMUNITY): Payer: Medicare Other

## 2013-01-20 ENCOUNTER — Encounter (HOSPITAL_COMMUNITY): Payer: Medicare Other

## 2013-01-22 ENCOUNTER — Encounter (HOSPITAL_COMMUNITY): Payer: Medicare Other

## 2013-01-23 ENCOUNTER — Ambulatory Visit (INDEPENDENT_AMBULATORY_CARE_PROVIDER_SITE_OTHER): Payer: Medicare Other | Admitting: Cardiology

## 2013-01-23 ENCOUNTER — Encounter: Payer: Self-pay | Admitting: Cardiology

## 2013-01-23 VITALS — BP 146/74 | HR 54 | Ht 68.0 in | Wt 168.0 lb

## 2013-01-23 DIAGNOSIS — I200 Unstable angina: Secondary | ICD-10-CM

## 2013-01-23 DIAGNOSIS — I119 Hypertensive heart disease without heart failure: Secondary | ICD-10-CM

## 2013-01-23 DIAGNOSIS — E785 Hyperlipidemia, unspecified: Secondary | ICD-10-CM

## 2013-01-23 DIAGNOSIS — I2510 Atherosclerotic heart disease of native coronary artery without angina pectoris: Secondary | ICD-10-CM

## 2013-01-23 NOTE — Assessment & Plan Note (Signed)
Previous MMR lipid profile has demonstrated normal LDL particle size and particle number.  He will continue Lipitor 10 mg daily.  He is not having any myalgias.

## 2013-01-23 NOTE — Assessment & Plan Note (Signed)
The patient has not been expressing any chest discomfort.  His previous ischemic symptoms were exertional dyspnea and those symptoms have improved significantly. He recently finished 36 appointments with the cardiac rehabilitation program and now is going to the [facility]

## 2013-01-23 NOTE — Assessment & Plan Note (Signed)
Blood pressure has remained stable on current therapy. 

## 2013-01-23 NOTE — Progress Notes (Signed)
Johnathan Strong Date of Birth: [birth year] Medical Record #[id number]  History of Present Illness: Johnathan Strong is seen today for followup after recent cardiac catheterization. He presented in April with symptoms of dyspnea on exertion. Subsequent Myoview study showed significant inferior wall ischemia. He underwent cardiac catheterization on 08/27/2012. This demonstrated ostial occlusion of the right coronary with excellent left to right collateral flow. He had a high-grade proximal LAD stenosis. LV function was well-preserved. He is seen to discuss treatment options today. Patient does have a history of COPD Gold stage III, CLL, TIA, and hypertension. He is aspirin allergic.  He subsequently underwent successful DES stenting of his LAD on 09/26/12.  He will be on lifelong Plavix.  He is allergic to aspirin.  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  He had a V/Q 12 assay prior to his stent placement which showed an excellent response of Plavix at 79 PR U The patient has a past history of dyslipidemia and has had good response to low-dose Lipitor 10 mg daily.  Current Outpatient Prescriptions on File Prior to Visit  Medication Sig Dispense Refill  . albuterol (VENTOLIN HFA) 108 (90 BASE) MCG/ACT inhaler Inhale 2 puffs into the lungs every 6 (six) hours as needed for wheezing.       [name] amLODipine (NORVASC) 5 MG tablet Take 7.5 mg by mouth daily.      [name] atorvastatin (LIPITOR) 10 MG tablet Take 1 tablet (10 mg total) by mouth daily.  90 tablet  0  . Azelastine HCl (ASTELIN NA) Place 1 spray into the nose as needed (for congestion).       . budesonide-formoterol (SYMBICORT) 160-4.5 MCG/ACT inhaler Inhale 2 puffs into the lungs 2 (two) times daily.      . carvedilol (COREG) 12.5 MG tablet TAKE  (1)  TABLET TWICE A DAY.  180 tablet  0  . Cholecalciferol (VITAMIN D3) 1000 UNITS CAPS Take 1 capsule by mouth daily.       . cimetidine  (TAGAMET) 300 MG tablet TAKE 1 TABLET UP TO 4 TIMES A DAY  40 tablet  11  . clopidogrel (PLAVIX) 75 MG tablet Take 1 tablet (75 mg total) by mouth daily.  90 tablet  3  . diphenhydramine-acetaminophen (TYLENOL PM) 25-500 MG TABS Take 1 tablet by mouth at bedtime as needed. sleep      . fluticasone (FLONASE) 50 MCG/ACT nasal spray Place 2 sprays into the nose as needed for allergies.       . furosemide (LASIX) 20 MG tablet Take 20 mg by mouth daily as needed for edema.       [name] losartan (COZAAR) 50 MG tablet Take 50 mg by mouth daily.      . montelukast (SINGULAIR) 10 MG tablet Take 10 mg by mouth at bedtime.       . nitroGLYCERIN (NITROSTAT) 0.4 MG SL tablet Place 1 tablet (0.4 mg total) under the tongue every 5 (five) minutes as needed for chest pain.  25 tablet  3  . Omega-3 Fatty Acids (FISH OIL) 1200 MG CAPS Take 1 capsule by mouth daily.       [name] SPIRIVA HANDIHALER 18 MCG inhalation capsule INHALE THE CONTENTS OF ONE CAPSULE EVERY DAY AS DIRECTED  30 capsule  4  . testosterone cypionate (DEPOTESTOTERONE CYPIONATE) 100 MG/ML injection Inject 200 mg into the muscle every 28 (twenty-eight) days. For IM use only      .  tiotropium (SPIRIVA) 18 MCG inhalation capsule Place 18 mcg into inhaler and inhale daily.      . traMADol (ULTRAM) 50 MG tablet TAKE 1 TABLET UP TO TWICE A DAY.  180 tablet  0  . triamcinolone cream (KENALOG) 0.1 % APPLY TO AFFECTED AREAS 2 TIMES A DAY AS NEEDED  45 g  3   Current Facility-Administered Medications on File Prior to Visit  Medication Dose Route Frequency Provider Last Rate Last Dose  . testosterone cypionate (DEPOTESTOTERONE CYPIONATE) injection 200 mg  200 mg Intramuscular Q28 days Ernestina Penna, MD   200 mg at 01/08/13 1511    Allergies  Allergen Reactions  . Aspirin Anaphylaxis, Shortness Of Breath and Swelling  . Nsaids Anaphylaxis, Shortness Of Breath and Swelling  . Septra [Sulfamethoxazole-Tmp Ds] Rash  . Benicar [Olmesartan Medoxomil]     dizzy  .  Salicylates     SOB  . Ampicillin Rash    Past Medical History  Diagnosis Date  . Osteopenia 2006  . Hyperlipidemia   . BPH (benign prostatic hypertrophy)   . Transient global amnesia   . Osteoarthritis   . History of TIA (transient ischemic attack) 1980'S    NO RESIDUAL  . COPD (chronic obstructive pulmonary disease) with emphysema   . Chronic back pain   . Itching SECONDARY TO CLL-- CONTROLLED W/ SINGULAIR  . Frequency   . Nocturia   . Hypertension CARDIOLOGIST- DR Dan Dissinger-- LAST VISIT 12-15-2010 NOTE IN EPIC  . CLL (chronic lymphocytic leukemia) LAST PLT COUNT APRL 2012  168    Managed at [facility]  BY [facility]  . Bladder cancer   . Aspirin allergy     Eyes swell  . Asthma   . Dyspnea on exertion   . CAD (coronary artery disease)     a. 08/2012 Cath: LM nl, LAD 90p, LCX 20-30, RCA 100ost fills via L->R collats, EF 55-65%;  b. 09/2012 PCI of prox LAD with 3.0x16 Promus DES.    Past Surgical History  Procedure Laterality Date  . Excisional hemorrhoidectomy  1980  . Cardiovascular stress test  07/2005    a. 07/2005- no reversible ischemia, normal EF b. 06/2009- EF 73%, no reversible ischemia, inferolateral TWIs at rest, upright in recovery c. 08/2012- mediuim-sized partially reversible basal to mid inferior and inferoseptal perfusion defect c/w with prior infarction and peri-infarct ischemia; EF 68% and borderline ST/T changes on stress ECG   . Transurethral resection of bladder tumor  10-19-2008    AND DILATION URETHRAL STRICTURE  . Cataract extraction w/ intraocular lens  implant, bilateral Bilateral ~ 2007  . Transthoracic echocardiogram  01-03-2011    LVEF 60-65%, grade 1 diastolic dysfunction, no WMAs or structural abnormalities  . Cystoscopy with biopsy  10/30/2011    Procedure: CYSTOSCOPY WITH BIOPSY;  Surgeon: Garnett Farm, MD;  Location: [facility];  Service: Urology;  Laterality: N/A;  . Cardiac catheterization  08/27/2012  . Coronary angioplasty  with stent placement  09/26/2012    "1" (09/26/2012)  . Coronary stent placement  09/26/12    History  Smoking status  . Former Smoker -- 1.00 packs/day for 25 years  . Types: Cigarettes  . Quit date: 03/06/1960  Smokeless tobacco  . Former Neurosurgeon  . Quit date: 10/25/1978    History  Alcohol Use  . Yes    Comment: 09/26/2012 "glass of wine or mixed drink once/month"    Family History  Problem Relation Age of Onset  . Emphysema  Father   . Heart disease Father   . COPD Father   . Atrial fibrillation Father   . Kidney cancer Mother   . Kidney failure Mother     Review of Systems: As noted in history of present illness..  All other systems were reviewed and are negative.  Physical Exam: BP 146/74  Pulse 54  Ht 5\' 8"  (1.727 m)  Wt 168 lb (76.204 kg)  BMI 25.55 kg/m2  SpO2 93% He is a pleasant elderly white male in no acute distress. HEENT: Normocephalic, atraumatic. Pupils equal round and reactive to light accommodation. Extraocular movements are full. Oropharynx is clear. Neck is supple no JVD, adenopathy, thyromegaly, or bruits. Lungs: Clear to auscultation and percussion. No wheezes. Cardiovascular: Regular rate and rhythm, normal S1 and S2, no gallop, murmur, or click. Abdomen: Soft and nontender. No hepatosplenomegaly or masses. Bowel sounds are positive. Extremities: No cyanosis, clubbing, or edema. Pedal pulses are 2+. Radial pulses are 2+. Skin: Warm and dry Neuro: Alert and oriented x3. Cranial nerves II through XII are intact.   Assessment / Plan: The patient is doing very well.  He will continue current therapy.  Recheck in 3 months for followup office visit and EKG

## 2013-01-23 NOTE — Patient Instructions (Signed)
Your physician recommends that you continue on your current medications as directed. Please refer to the Current Medication list given to you today.  Your physician recommends that you schedule a follow-up appointment in: 3 MONTH OV/EKG  

## 2013-01-24 ENCOUNTER — Encounter (HOSPITAL_COMMUNITY): Payer: Medicare Other

## 2013-01-27 ENCOUNTER — Encounter (HOSPITAL_COMMUNITY): Payer: Medicare Other

## 2013-01-27 ENCOUNTER — Other Ambulatory Visit: Payer: Self-pay | Admitting: Family Medicine

## 2013-01-29 ENCOUNTER — Encounter (HOSPITAL_COMMUNITY): Payer: Medicare Other

## 2013-01-31 ENCOUNTER — Encounter (HOSPITAL_COMMUNITY): Payer: Medicare Other

## 2013-02-03 ENCOUNTER — Encounter (HOSPITAL_COMMUNITY): Payer: Medicare Other

## 2013-02-03 ENCOUNTER — Other Ambulatory Visit: Payer: Self-pay | Admitting: Family Medicine

## 2013-02-05 ENCOUNTER — Encounter (HOSPITAL_COMMUNITY): Payer: Medicare Other

## 2013-02-05 MED ORDER — TIOTROPIUM BROMIDE MONOHYDRATE 18 MCG IN CAPS: 18.0000 ug | ORAL_CAPSULE | Freq: Every day | RESPIRATORY_TRACT | Status: DC

## 2013-02-05 NOTE — Telephone Encounter (Signed)
RXs for Spiriva and Depo testosterone called into [facility]

## 2013-02-05 NOTE — Telephone Encounter (Signed)
Last seen 10/21/12, last level done 10/18/12,Route to pool A, call into [place]

## 2013-02-07 ENCOUNTER — Encounter (HOSPITAL_COMMUNITY): Payer: Medicare Other

## 2013-02-07 ENCOUNTER — Ambulatory Visit (INDEPENDENT_AMBULATORY_CARE_PROVIDER_SITE_OTHER): Payer: Medicare Other | Admitting: *Deleted

## 2013-02-07 DIAGNOSIS — E291 Testicular hypofunction: Secondary | ICD-10-CM

## 2013-02-07 NOTE — Progress Notes (Signed)
Testosterone injection given and tolerated well.  

## 2013-02-07 NOTE — Patient Instructions (Signed)
Testosterone injection What is this medicine? TESTOSTERONE (tes TOS ter one) is the main male hormone. It supports normal male development such as muscle growth, facial hair, and deep voice. It is used in males to treat low testosterone levels. This medicine may be used for other purposes; ask your health care provider or pharmacist if you have questions. COMMON BRAND NAME(S): Andro-L.A., Aveed, Delatestryl, Depo-Testosterone, Virilon What should I tell my health care provider before I take this medicine? They need to know if you have any of these conditions: -breast cancer -diabetes -heart disease -kidney disease -liver disease -lung disease -prostate cancer, enlargement -an unusual or allergic reaction to testosterone, other medicines, foods, dyes, or preservatives -pregnant or trying to get pregnant -breast-feeding How should I use this medicine? This medicine is for injection into a muscle. It is usually given by a health care professional in a hospital or clinic setting. Contact your pediatrician regarding the use of this medicine in children. While this medicine may be prescribed for children as young as 12 years of age for selected conditions, precautions do apply. Overdosage: If you think you have taken too much of this medicine contact a poison control center or emergency room at once. NOTE: This medicine is only for you. Do not share this medicine with others. What if I miss a dose? Try not to miss a dose. Your doctor or health care professional will tell you when your next injection is due. Notify the office if you are unable to keep an appointment. What may interact with this medicine? -medicines for diabetes -medicines that treat or prevent blood clots like warfarin -oxyphenbutazone -propranolol -steroid medicines like prednisone or cortisone This list may not describe all possible interactions. Give your health care provider a list of all the medicines, herbs,  non-prescription drugs, or dietary supplements you use. Also tell them if you smoke, drink alcohol, or use illegal drugs. Some items may interact with your medicine. What should I watch for while using this medicine? Visit your doctor or health care professional for regular checks on your progress. They will need to check the level of testosterone in your blood. This medicine may affect blood sugar levels. If you have diabetes, check with your doctor or health care professional before you change your diet or the dose of your diabetic medicine. This drug is banned from use in athletes by most athletic organizations. What side effects may I notice from receiving this medicine? Side effects that you should report to your doctor or health care professional as soon as possible: -allergic reactions like skin rash, itching or hives, swelling of the face, lips, or tongue -breast enlargement -breathing problems -changes in mood, especially anger, depression, or rage -dark urine -general ill feeling or flu-like symptoms -light-colored stools -loss of appetite, nausea -nausea, vomiting -right upper belly pain -stomach pain -swelling of ankles -too frequent or persistent erections -trouble passing urine or change in the amount of urine -unusually weak or tired -yellowing of the eyes or skin Additional side effects that can occur in women include: -deep or hoarse voice -facial hair growth -irregular menstrual periods Side effects that usually do not require medical attention (report to your doctor or health care professional if they continue or are bothersome): -acne -change in sex drive or performance -hair loss -headache This list may not describe all possible side effects. Call your doctor for medical advice about side effects. You may report side effects to FDA at 1-800-FDA-1088. Where should I keep my medicine? Keep   out of the reach of children. This medicine can be abused. Keep your  medicine in a safe place to protect it from theft. Do not share this medicine with anyone. Selling or giving away this medicine is dangerous and against the law. Store at room temperature between 20 and 25 degrees C (68 and 77 degrees F). Do not freeze. Protect from light. Follow the directions for the product you are prescribed. Throw away any unused medicine after the expiration date. NOTE: This sheet is a summary. It may not cover all possible information. If you have questions about this medicine, talk to your doctor, pharmacist, or health care provider.  2014, Elsevier/Gold Standard. (2007-05-03 16:13:46)  

## 2013-02-10 ENCOUNTER — Encounter (HOSPITAL_COMMUNITY): Payer: Medicare Other

## 2013-02-12 ENCOUNTER — Encounter (HOSPITAL_COMMUNITY): Payer: Medicare Other

## 2013-02-14 ENCOUNTER — Encounter (HOSPITAL_COMMUNITY): Payer: Medicare Other

## 2013-02-14 ENCOUNTER — Other Ambulatory Visit: Payer: Self-pay | Admitting: Family Medicine

## 2013-02-17 NOTE — Telephone Encounter (Signed)
Last seen 10/21/12  [facility]   Pharmacy requesting a 90 day supply

## 2013-02-20 ENCOUNTER — Ambulatory Visit (INDEPENDENT_AMBULATORY_CARE_PROVIDER_SITE_OTHER): Payer: Medicare Other | Admitting: Family Medicine

## 2013-02-20 ENCOUNTER — Encounter: Payer: Self-pay | Admitting: Family Medicine

## 2013-02-20 VITALS — BP 127/66 | HR 67 | Temp 96.8°F | Ht 68.0 in | Wt 168.0 lb

## 2013-02-20 DIAGNOSIS — E349 Endocrine disorder, unspecified: Secondary | ICD-10-CM

## 2013-02-20 DIAGNOSIS — Z79899 Other long term (current) drug therapy: Secondary | ICD-10-CM

## 2013-02-20 DIAGNOSIS — J449 Chronic obstructive pulmonary disease, unspecified: Secondary | ICD-10-CM

## 2013-02-20 DIAGNOSIS — Z8551 Personal history of malignant neoplasm of bladder: Secondary | ICD-10-CM

## 2013-02-20 DIAGNOSIS — J309 Allergic rhinitis, unspecified: Secondary | ICD-10-CM

## 2013-02-20 DIAGNOSIS — C9110 Chronic lymphocytic leukemia of B-cell type not having achieved remission: Secondary | ICD-10-CM

## 2013-02-20 DIAGNOSIS — E785 Hyperlipidemia, unspecified: Secondary | ICD-10-CM

## 2013-02-20 DIAGNOSIS — E291 Testicular hypofunction: Secondary | ICD-10-CM

## 2013-02-20 DIAGNOSIS — I2510 Atherosclerotic heart disease of native coronary artery without angina pectoris: Secondary | ICD-10-CM

## 2013-02-20 DIAGNOSIS — Z23 Encounter for immunization: Secondary | ICD-10-CM

## 2013-02-20 DIAGNOSIS — I119 Hypertensive heart disease without heart failure: Secondary | ICD-10-CM

## 2013-02-20 MED ORDER — TRAMADOL HCL 50 MG PO TABS: ORAL_TABLET | ORAL | Status: DC

## 2013-02-20 NOTE — Progress Notes (Signed)
Subjective:    Patient ID: Johnathan Strong, male    DOB: May 21, 1923, 79 y.o.   MRN: [id number]  HPI Pt here for follow up and management of chronic medical problems. Patient notices that he bleeds easily and seems to be secondary to being on the Plavix. The patient sees Dr. Vernie Ammons on a quarterly basis further his bladder cancer. He does need to have lab work done and he will return to clinic for this lab work. He is to use his Prevnar vaccine. He gets testosterone replacement and a prescription was given to him today to pick up his testosterone injections at the drugstore. He indicates that his breathing is stable with his inhalers, he has more energy since he's had a stent put in by Dr. Patty Sermons.      Patient Active Problem List   Diagnosis Date Noted  . Hyperlipemia 10/21/2012  . Unstable angina 09/27/2012  . CAD (coronary artery disease)   . DOE (dyspnea on exertion) 08/02/2012  . Dizziness 08/02/2012  . Hypertension 12/15/2010  . Dyslipidemia 12/15/2010  . Chronic lymphoblastic leukemia 12/15/2010  . COPD GOLD III 08/30/2010   Outpatient Encounter Prescriptions as of 02/20/2013  Medication Sig  . albuterol (VENTOLIN HFA) 108 (90 BASE) MCG/ACT inhaler Inhale 2 puffs into the lungs every 6 (six) hours as needed for wheezing.   [name] atorvastatin (LIPITOR) 10 MG tablet Take 1 tablet (10 mg total) by mouth daily.  . Azelastine HCl (ASTELIN NA) Place 1 spray into the nose as needed (for congestion).   . carvedilol (COREG) 12.5 MG tablet TAKE  (1)  TABLET TWICE A DAY.  [name] Cholecalciferol (VITAMIN D3) 1000 UNITS CAPS Take 1 capsule by mouth daily.   . cimetidine (TAGAMET) 300 MG tablet TAKE 1 TABLET UP TO 4 TIMES A DAY  . clopidogrel (PLAVIX) 75 MG tablet Take 1 tablet (75 mg total) by mouth daily.  [name] DEPO-TESTOSTERONE 200 MG/ML injection INJECT IM MONTHLY  . diphenhydramine-acetaminophen (TYLENOL PM) 25-500 MG TABS Take 1 tablet by mouth at bedtime as needed. sleep  . fluticasone  (FLONASE) 50 MCG/ACT nasal spray Place 2 sprays into the nose as needed for allergies.   . furosemide (LASIX) 20 MG tablet Take 20 mg by mouth daily as needed for edema.   [name] losartan (COZAAR) 50 MG tablet Take 50 mg by mouth daily.  . montelukast (SINGULAIR) 10 MG tablet Take 10 mg by mouth at bedtime.   . nitroGLYCERIN (NITROSTAT) 0.4 MG SL tablet Place 1 tablet (0.4 mg total) under the tongue every 5 (five) minutes as needed for chest pain.  . NORVASC 5 MG tablet TAKE UP TO 2 TABLETS ONCE DAILY AS DIRECTED  . Omega-3 Fatty Acids (FISH OIL) 1200 MG CAPS Take 1 capsule by mouth daily.   . SYMBICORT 160-4.5 MCG/ACT inhaler 2 PUFFS EVERY 12 HOURS  . tiotropium (SPIRIVA) 18 MCG inhalation capsule Place 1 capsule (18 mcg total) into inhaler and inhale daily.  . traMADol (ULTRAM) 50 MG tablet TAKE 1 TABLET UP TO TWICE A DAY.  [name] triamcinolone cream (KENALOG) 0.1 % APPLY TO AFFECTED AREAS 2 TIMES A DAY AS NEEDED    Review of Systems  Constitutional: Negative.   HENT: Negative.   Eyes: Negative.   Respiratory: Negative.   Cardiovascular: Negative.   Gastrointestinal: Negative.   Endocrine: Negative.   Genitourinary: Negative.   Musculoskeletal: Negative.   Skin: Negative.   Allergic/Immunologic: Negative.   Neurological: Negative.   Hematological: Bruises/bleeds easily (ON PLAVIX).  Psychiatric/Behavioral: Negative.        Objective:   Physical Exam  Nursing note and vitals reviewed. Constitutional: He is oriented to person, place, and time. He appears well-developed and well-nourished. No distress.  Pleasant well-nourished for his age  HENT:  Head: Normocephalic and atraumatic.  Right Ear: External ear normal.  Left Ear: External ear normal.  Nose: Nose normal.  Mouth/Throat: Oropharynx is clear and moist. No oropharyngeal exudate.  Minimal nasal congestion bilaterally  Eyes: Conjunctivae and EOM are normal. Pupils are equal, round, and reactive to light. Right eye exhibits no  discharge. Left eye exhibits no discharge. No scleral icterus.  Neck: Normal range of motion. Neck supple. No thyromegaly present.   No carotid bruits  Cardiovascular: Normal rate, regular rhythm, normal heart sounds and intact distal pulses.  Exam reveals no gallop and no friction rub.   No murmur heard. At 72 per minute  Pulmonary/Chest: Effort normal and breath sounds normal. No respiratory distress. He has no wheezes. He has no rales. He exhibits no tenderness.  No axillary nodes  Abdominal: Soft. Bowel sounds are normal. He exhibits no mass. There is no tenderness. There is no rebound and no guarding.  Genitourinary: Rectum normal and penis normal.  The prostate was enlarged without any lumps or masses. It was smooth. There were no rectal masses. There was no inguinal hernia. Both testes were small in nature without any lumps.  Musculoskeletal: Normal range of motion. He exhibits no edema and no tenderness.  Lymphadenopathy:    He has no cervical adenopathy.  Neurological: He is alert and oriented to person, place, and time. He has normal reflexes. No cranial nerve deficit.  Skin: Skin is warm and dry. No rash noted. No erythema. No pallor.  Psychiatric: He has a normal mood and affect. His behavior is normal. Judgment and thought content normal.   BP 127/66  Pulse 67  Temp(Src) 96.8 F (36 C) (Oral)  Ht 5\' 8"  (1.727 m)  Wt 168 lb (76.204 kg)  BMI 25.55 kg/m2        Assessment & Plan:  1. CAD (coronary artery disease)  2. Dyslipidemia  3. Hyperlipemia  4. Hypertension  5. High risk medication use  6. Allergic rhinitis  7. CLL (chronic lymphocytic leukemia)  8. Testosterone deficiency  9. Hypogonadism male  36. COPD with asthma  11. History of bladder cancer  No orders of the defined types were placed in this encounter.   No orders of the defined types were placed in this encounter.   Patient Instructions  Continue current medications. Continue good  therapeutic lifestyle changes which include good diet and exercise. Fall precautions discussed with patient. Schedule your flu vaccine if you haven't had it yet If you are over 71 years old - you may need Prevnar 13 or the adult Pneumonia vaccine. Continued followup with the cardiologist and the urologist as planned Continue your inhalers as doing Continue the Coumadin his humidification as doing Drink plenty of fluids    Patient will return to clinic for lab work fasting Nyra Capes MD

## 2013-02-20 NOTE — Addendum Note (Signed)
Addended by: Magdalene River on: 02/20/2013 11:49 AM   Modules accepted: Orders

## 2013-02-20 NOTE — Addendum Note (Signed)
Addended by: Magdalene River on: 02/20/2013 03:08 PM   Modules accepted: Orders

## 2013-02-20 NOTE — Patient Instructions (Addendum)
Continue current medications. Continue good therapeutic lifestyle changes which include good diet and exercise. Fall precautions discussed with patient. Schedule your flu vaccine if you haven't had it yet If you are over 77 years old - you may need Prevnar 13 or the adult Pneumonia vaccine. Continued followup with the cardiologist and the urologist as planned Continue your inhalers as doing Continue the Coumadin his humidification as doing Drink plenty of fluids

## 2013-02-21 ENCOUNTER — Encounter (HOSPITAL_COMMUNITY): Payer: Medicare Other

## 2013-02-28 ENCOUNTER — Encounter (HOSPITAL_COMMUNITY): Payer: Medicare Other

## 2013-03-07 ENCOUNTER — Encounter (HOSPITAL_COMMUNITY): Payer: Medicare Other

## 2013-03-14 ENCOUNTER — Ambulatory Visit (INDEPENDENT_AMBULATORY_CARE_PROVIDER_SITE_OTHER): Payer: Medicare Other | Admitting: *Deleted

## 2013-03-14 ENCOUNTER — Encounter (HOSPITAL_COMMUNITY): Payer: Medicare Other

## 2013-03-14 DIAGNOSIS — E291 Testicular hypofunction: Secondary | ICD-10-CM

## 2013-03-14 DIAGNOSIS — E349 Endocrine disorder, unspecified: Secondary | ICD-10-CM

## 2013-03-14 NOTE — Progress Notes (Signed)
Patient ID: Johnathan Strong, male   DOB: 16-Apr-1923, 80 y.o.   MRN: [id number] Pt tolerated inj well

## 2013-03-21 ENCOUNTER — Encounter (HOSPITAL_COMMUNITY): Payer: Medicare Other

## 2013-03-28 ENCOUNTER — Encounter (HOSPITAL_COMMUNITY): Payer: Medicare Other

## 2013-04-04 ENCOUNTER — Encounter (HOSPITAL_COMMUNITY): Payer: Medicare Other

## 2013-04-05 ENCOUNTER — Other Ambulatory Visit: Payer: Self-pay | Admitting: Family Medicine

## 2013-04-07 ENCOUNTER — Other Ambulatory Visit: Payer: Self-pay | Admitting: Family Medicine

## 2013-04-08 ENCOUNTER — Ambulatory Visit: Payer: Medicare Other

## 2013-04-08 NOTE — Telephone Encounter (Signed)
last seen 02/20/13  DWM

## 2013-04-11 ENCOUNTER — Encounter (HOSPITAL_COMMUNITY): Payer: Medicare Other

## 2013-04-11 ENCOUNTER — Other Ambulatory Visit: Payer: Self-pay | Admitting: Family Medicine

## 2013-04-14 ENCOUNTER — Ambulatory Visit (INDEPENDENT_AMBULATORY_CARE_PROVIDER_SITE_OTHER): Payer: Medicare Other | Admitting: *Deleted

## 2013-04-14 DIAGNOSIS — E349 Endocrine disorder, unspecified: Secondary | ICD-10-CM

## 2013-04-14 DIAGNOSIS — E291 Testicular hypofunction: Secondary | ICD-10-CM

## 2013-04-14 NOTE — Patient Instructions (Signed)
Testosterone injection What is this medicine? TESTOSTERONE (tes TOS ter one) is the main male hormone. It supports normal male development such as muscle growth, facial hair, and deep voice. It is used in males to treat low testosterone levels. This medicine may be used for other purposes; ask your health care provider or pharmacist if you have questions. COMMON BRAND NAME(S): Andro-L.A., Aveed, Delatestryl, Depo-Testosterone, Virilon What should I tell my health care provider before I take this medicine? They need to know if you have any of these conditions: -breast cancer -diabetes -heart disease -kidney disease -liver disease -lung disease -prostate cancer, enlargement -an unusual or allergic reaction to testosterone, other medicines, foods, dyes, or preservatives -pregnant or trying to get pregnant -breast-feeding How should I use this medicine? This medicine is for injection into a muscle. It is usually given by a health care professional in a hospital or clinic setting. Contact your pediatrician regarding the use of this medicine in children. While this medicine may be prescribed for children as young as 32 years of age for selected conditions, precautions do apply. Overdosage: If you think you have taken too much of this medicine contact a poison control center or emergency room at once. NOTE: This medicine is only for you. Do not share this medicine with others. What if I miss a dose? Try not to miss a dose. Your doctor or health care professional will tell you when your next injection is due. Notify the office if you are unable to keep an appointment. What may interact with this medicine? -medicines for diabetes -medicines that treat or prevent blood clots like warfarin -oxyphenbutazone -propranolol -steroid medicines like prednisone or cortisone This list may not describe all possible interactions. Give your health care provider a list of all the medicines, herbs,  non-prescription drugs, or dietary supplements you use. Also tell them if you smoke, drink alcohol, or use illegal drugs. Some items may interact with your medicine. What should I watch for while using this medicine? Visit your doctor or health care professional for regular checks on your progress. They will need to check the level of testosterone in your blood. This medicine may affect blood sugar levels. If you have diabetes, check with your doctor or health care professional before you change your diet or the dose of your diabetic medicine. This drug is banned from use in athletes by most athletic organizations. What side effects may I notice from receiving this medicine? Side effects that you should report to your doctor or health care professional as soon as possible: -allergic reactions like skin rash, itching or hives, swelling of the face, lips, or tongue -breast enlargement -breathing problems -changes in mood, especially anger, depression, or rage -dark urine -general ill feeling or flu-like symptoms -light-colored stools -loss of appetite, nausea -nausea, vomiting -right upper belly pain -stomach pain -swelling of ankles -too frequent or persistent erections -trouble passing urine or change in the amount of urine -unusually weak or tired -yellowing of the eyes or skin Additional side effects that can occur in women include: -deep or hoarse voice -facial hair growth -irregular menstrual periods Side effects that usually do not require medical attention (report to your doctor or health care professional if they continue or are bothersome): -acne -change in sex drive or performance -hair loss -headache This list may not describe all possible side effects. Call your doctor for medical advice about side effects. You may report side effects to FDA at 1-800-FDA-1088. Where should I keep my medicine? Keep  out of the reach of children. This medicine can be abused. Keep your  medicine in a safe place to protect it from theft. Do not share this medicine with anyone. Selling or giving away this medicine is dangerous and against the law. Store at room temperature between 20 and 25 degrees C (68 and 77 degrees F). Do not freeze. Protect from light. Follow the directions for the product you are prescribed. Throw away any unused medicine after the expiration date. NOTE: This sheet is a summary. It may not cover all possible information. If you have questions about this medicine, talk to your doctor, pharmacist, or health care provider.  2014, [employer] Standard. (2007-05-03 16:13:46)

## 2013-04-14 NOTE — Progress Notes (Signed)
Patient ID: Johnathan Strong, male   DOB: [birth year], 80 y.o.   MRN: [id number] Testosterone given and tolerated well

## 2013-04-14 NOTE — Telephone Encounter (Signed)
Last seen 12/14  DWM Last lipid 10/21/12

## 2013-04-15 NOTE — Addendum Note (Signed)
Encounter addended by: Norlene Duel, RN on: 04/15/2013  7:10 AM<BR>     Documentation filed: Clinical Notes

## 2013-04-15 NOTE — Addendum Note (Signed)
Encounter addended by: Norlene Duel, RN on: 04/15/2013  7:50 AM<BR>     Documentation filed: Notes Section

## 2013-04-15 NOTE — Addendum Note (Signed)
Encounter addended by: Norlene Duel, RN on: 04/15/2013  7:33 AM<BR>     Documentation filed: Notes Section

## 2013-04-15 NOTE — Addendum Note (Signed)
Encounter addended by: Norlene Duel, RN on: 04/15/2013  7:35 AM<BR>     Documentation filed: Clinical Notes

## 2013-04-15 NOTE — Progress Notes (Signed)
Cardiac Rehabilitation Program Outcomes Report   Orientation:  08/042014 1st week report: 10/18/2012 Graduate Date:  tbd Discharge Date:  tbd # of sessions completed: 3 DX: CAD and stent X 1  Cardiologist: Peter Martinique Family MD:  Moore,Donald Class Time:  11:00  A.  Exercise Program:  Tolerates exercise @ 1.90 METS for 15 minutes and Walk Test Results:  Pre: Pre Walk Test: Rest HR 57,BP 130/58, O2 95%,RPE 8 and RPD 9, 6 minute HR 71, BP 122.62, O2 93%, RPE 13 and RPD 13, Post HR 56, BP 110/60, O2 96%, RPE 7 and RPD 7, Walked 950 feet at1.10mph at 2.4 METS,    B.  Mental Health:  Good mental attitude  C.  Education/Instruction/Skills  Accurately checks own pulse.  Rest:  61  Exercise:  89, Knows THR for exercise and Uses Perceived Exertion Scale and/or Dyspnea Scale  Uses Perceived Exertion Scale and/or Dyspnea Scale  D.  Nutrition/Weight Control/Body Composition:  Adherence to prescribed nutrition program: good    E.  Blood Lipids    Lab Results  Component Value Date   CHOL 110 10/21/2012    F.  Lifestyle Changes:  Making positive lifestyle changes and Not smoking:  Quit 1962  G.  Symptoms noted with exercise:  Asymptomatic  Report Completed By:  Oletta Lamas. Insiya Oshea RN   Comments:  This is patients 1st week Report. He has done well his first week. He achieved a peak METS of 1.90. His resting HR was 61 and resting BP was 140/80 and his peak HR was 89 and peak BP was 142/60. A halfway report will follow upon patients 18 th visit.

## 2013-04-15 NOTE — Addendum Note (Signed)
Encounter addended by: Norlene Duel, RN on: 04/15/2013  7:51 AM<BR>     Documentation filed: Clinical Notes

## 2013-04-15 NOTE — Progress Notes (Signed)
Cardiac Rehabilitation Program Outcomes Report   Orientation:  10/07/2012 Halfway report: 11/27/2012 Graduate Date:  tbd Discharge Date:  tbd # of sessions completed: 18 DX: CAD and Stent X 1  Cardiologist: Martinique, Peter Family MD:  Bradly Bienenstock Time:  11:00  A.  Exercise Program:  Tolerates exercise @ 3.77 METS for 15 minutes  B.  Mental Health:  Good mental attitude  C.  Education/Instruction/Skills  Accurately checks own pulse.  Rest:  53  Exercise: 76, Knows THR for exercise and Uses Perceived Exertion Scale and/or Dyspnea Scale  Uses Perceived Exertion Scale and/or Dyspnea Scale  D.  Nutrition/Weight Control/Body Composition:  Adherence to prescribed nutrition program: good    E.  Blood Lipids    Lab Results  Component Value Date   CHOL 110 10/21/2012    F.  Lifestyle Changes:  Making positive lifestyle changes and Not smoking:  Quit 1962  G.  Symptoms noted with exercise:  Asymptomatic  Report Completed By:  Oletta Lamas. Sabirin Baray RN   Comments:  This is patients Halfway report. He achieved a peak METS of 3.77. His resting HR was 53 and his resting BP was 128/52, his peak Hr was 76 and his peak BP was 140/54. A report will follow upon his 36th visit his graduation.

## 2013-04-15 NOTE — Progress Notes (Signed)
Cardiac Rehabilitation Program Outcomes Report   Orientation:  10/07/2012 Graduate Date:  01/15/2013 Discharge Date:  01/15/2013 # of sessions completed: 36 DX: CAD and Stent X 1  Cardiologist: Peter Martinique Family MD:  Redge Gainer Class Time:  11:00  A.  Exercise Program:  Tolerates exercise @ 4.0 METS for 15 minutes and Walk Test Results:  Post: Post walk test: Rest HR 57, BP 92/60, O2 96%, RPE 7 and RPD 7, 6 min HR 80, BP 130/62, O2 94% RPE 13 and RPD 12, Post HR 62, BP 110/60, O@ 97% RPE 7 and RPD 8 walked 1490feet at 2.74 mph at 3.1 METS. Discharged with excellent mind to continue to exercise.  B.  Mental Health:  Good mental attitude  C.  Education/Instruction/Skills  Accurately checks own pulse.  Rest:  64  Exercise:  84, Knows THR for exercise, Uses Perceived Exertion Scale and/or Dyspnea Scale and Attended 13 education classes  Uses Perceived Exertion Scale and/or Dyspnea Scale  D.  Nutrition/Weight Control/Body Composition:  Adherence to prescribed nutrition program: good    E.  Blood Lipids    Lab Results  Component Value Date   CHOL 110 10/21/2012    F.  Lifestyle Changes:  Making positive lifestyle changes and Not smoking:  Quit 1962  G.  Symptoms noted with exercise:  Asymptomatic  Report Completed By:  Oletta Lamas. Nataliyah Packham RN   Comments:  This is patients graduation report . He has done very well in Rehab. He achieved a peak METS of 4.0. His resting HR was 64 and resting BP was 92/60. His peak HR was 84 and peak BP was 146/80. A F/U call will be made to patient at 1 month, 6 month and 1 year, To ensure compliance with exercise. Patient told us he is going to purchace a nu-step so he can exercise at home.

## 2013-04-15 NOTE — Addendum Note (Signed)
Encounter addended by: Norlene Duel, RN on: 04/15/2013  7:09 AM<BR>     Documentation filed: Notes Section

## 2013-04-18 ENCOUNTER — Encounter (HOSPITAL_COMMUNITY): Payer: Medicare Other

## 2013-04-25 ENCOUNTER — Encounter (HOSPITAL_COMMUNITY): Payer: Medicare Other

## 2013-05-02 ENCOUNTER — Encounter (HOSPITAL_COMMUNITY): Payer: Medicare Other

## 2013-05-09 ENCOUNTER — Encounter (HOSPITAL_COMMUNITY): Payer: Medicare Other

## 2013-05-13 ENCOUNTER — Ambulatory Visit (INDEPENDENT_AMBULATORY_CARE_PROVIDER_SITE_OTHER): Payer: Medicare Other | Admitting: *Deleted

## 2013-05-13 DIAGNOSIS — E291 Testicular hypofunction: Secondary | ICD-10-CM

## 2013-05-13 DIAGNOSIS — E349 Endocrine disorder, unspecified: Secondary | ICD-10-CM

## 2013-05-13 NOTE — Progress Notes (Signed)
Patient tolerated well.

## 2013-05-16 ENCOUNTER — Encounter (HOSPITAL_COMMUNITY): Payer: Medicare Other

## 2013-05-16 ENCOUNTER — Other Ambulatory Visit: Payer: Self-pay | Admitting: Family Medicine

## 2013-05-23 ENCOUNTER — Encounter (HOSPITAL_COMMUNITY): Payer: Medicare Other

## 2013-05-26 ENCOUNTER — Ambulatory Visit: Payer: Medicare Other | Admitting: Cardiology

## 2013-05-27 ENCOUNTER — Other Ambulatory Visit: Payer: Self-pay | Admitting: Family Medicine

## 2013-05-30 ENCOUNTER — Encounter (HOSPITAL_COMMUNITY): Payer: Medicare Other

## 2013-06-04 ENCOUNTER — Other Ambulatory Visit: Payer: Self-pay | Admitting: Family Medicine

## 2013-06-06 ENCOUNTER — Encounter (HOSPITAL_COMMUNITY): Payer: Medicare Other

## 2013-06-12 ENCOUNTER — Other Ambulatory Visit: Payer: Self-pay | Admitting: Family Medicine

## 2013-06-13 NOTE — Telephone Encounter (Signed)
Last filled and seen 02/20/13. Rx will print

## 2013-06-13 NOTE — Telephone Encounter (Signed)
This is okay to refill 

## 2013-06-16 ENCOUNTER — Ambulatory Visit (INDEPENDENT_AMBULATORY_CARE_PROVIDER_SITE_OTHER): Payer: Medicare Other | Admitting: *Deleted

## 2013-06-16 DIAGNOSIS — E291 Testicular hypofunction: Secondary | ICD-10-CM

## 2013-06-16 DIAGNOSIS — E349 Endocrine disorder, unspecified: Secondary | ICD-10-CM

## 2013-06-16 NOTE — Progress Notes (Signed)
Testosterone given and tolerated well

## 2013-06-16 NOTE — Patient Instructions (Signed)
Testosterone injection What is this medicine? TESTOSTERONE (tes TOS ter one) is the main male hormone. It supports normal male development such as muscle growth, facial hair, and deep voice. It is used in males to treat low testosterone levels. This medicine may be used for other purposes; ask your health care provider or pharmacist if you have questions. COMMON BRAND NAME(S): Andro-L.A., Aveed, Delatestryl, Depo-Testosterone, Virilon What should I tell my health care provider before I take this medicine? They need to know if you have any of these conditions: -breast cancer -diabetes -heart disease -kidney disease -liver disease -lung disease -prostate cancer, enlargement -an unusual or allergic reaction to testosterone, other medicines, foods, dyes, or preservatives -pregnant or trying to get pregnant -breast-feeding How should I use this medicine? This medicine is for injection into a muscle. It is usually given by a health care professional in a hospital or clinic setting. Contact your pediatrician regarding the use of this medicine in children. While this medicine may be prescribed for children as young as 32 years of age for selected conditions, precautions do apply. Overdosage: If you think you have taken too much of this medicine contact a poison control center or emergency room at once. NOTE: This medicine is only for you. Do not share this medicine with others. What if I miss a dose? Try not to miss a dose. Your doctor or health care professional will tell you when your next injection is due. Notify the office if you are unable to keep an appointment. What may interact with this medicine? -medicines for diabetes -medicines that treat or prevent blood clots like warfarin -oxyphenbutazone -propranolol -steroid medicines like prednisone or cortisone This list may not describe all possible interactions. Give your health care provider a list of all the medicines, herbs,  non-prescription drugs, or dietary supplements you use. Also tell them if you smoke, drink alcohol, or use illegal drugs. Some items may interact with your medicine. What should I watch for while using this medicine? Visit your doctor or health care professional for regular checks on your progress. They will need to check the level of testosterone in your blood. This medicine may affect blood sugar levels. If you have diabetes, check with your doctor or health care professional before you change your diet or the dose of your diabetic medicine. This drug is banned from use in athletes by most athletic organizations. What side effects may I notice from receiving this medicine? Side effects that you should report to your doctor or health care professional as soon as possible: -allergic reactions like skin rash, itching or hives, swelling of the face, lips, or tongue -breast enlargement -breathing problems -changes in mood, especially anger, depression, or rage -dark urine -general ill feeling or flu-like symptoms -light-colored stools -loss of appetite, nausea -nausea, vomiting -right upper belly pain -stomach pain -swelling of ankles -too frequent or persistent erections -trouble passing urine or change in the amount of urine -unusually weak or tired -yellowing of the eyes or skin Additional side effects that can occur in women include: -deep or hoarse voice -facial hair growth -irregular menstrual periods Side effects that usually do not require medical attention (report to your doctor or health care professional if they continue or are bothersome): -acne -change in sex drive or performance -hair loss -headache This list may not describe all possible side effects. Call your doctor for medical advice about side effects. You may report side effects to FDA at 1-800-FDA-1088. Where should I keep my medicine? Keep  out of the reach of children. This medicine can be abused. Keep your  medicine in a safe place to protect it from theft. Do not share this medicine with anyone. Selling or giving away this medicine is dangerous and against the law. Store at room temperature between 20 and 25 degrees C (68 and 77 degrees F). Do not freeze. Protect from light. Follow the directions for the product you are prescribed. Throw away any unused medicine after the expiration date. NOTE: This sheet is a summary. It may not cover all possible information. If you have questions about this medicine, talk to your doctor, pharmacist, or health care provider.  2014, [employer] Standard. (2007-05-03 16:13:46)

## 2013-06-17 ENCOUNTER — Telehealth: Payer: Self-pay | Admitting: Cardiology

## 2013-06-17 NOTE — Telephone Encounter (Signed)
Left message to call back  

## 2013-06-17 NOTE — Telephone Encounter (Signed)
New Message:  Pt's wife called and canceled her husbands appt on 4/22... She is requesting he be worked in to see Dr. Mare Ferrari sometime after 5/20... She staets she does not want him scheduled with the PA.. Dr. Sherryl Barters schedule is full.Marland KitchenMarland Kitchen

## 2013-06-19 ENCOUNTER — Other Ambulatory Visit (INDEPENDENT_AMBULATORY_CARE_PROVIDER_SITE_OTHER): Payer: Medicare Other

## 2013-06-19 DIAGNOSIS — R350 Frequency of micturition: Secondary | ICD-10-CM

## 2013-06-19 DIAGNOSIS — I10 Essential (primary) hypertension: Secondary | ICD-10-CM

## 2013-06-19 DIAGNOSIS — E785 Hyperlipidemia, unspecified: Secondary | ICD-10-CM

## 2013-06-19 DIAGNOSIS — E291 Testicular hypofunction: Secondary | ICD-10-CM

## 2013-06-19 DIAGNOSIS — E559 Vitamin D deficiency, unspecified: Secondary | ICD-10-CM

## 2013-06-19 LAB — POCT URINALYSIS DIPSTICK
BILIRUBIN UA: NEGATIVE
Glucose, UA: NEGATIVE
KETONES UA: NEGATIVE
Nitrite, UA: NEGATIVE
Spec Grav, UA: 1.01
Urobilinogen, UA: NEGATIVE
pH, UA: 7

## 2013-06-19 LAB — POCT UA - MICROSCOPIC ONLY
BACTERIA, U MICROSCOPIC: NEGATIVE
Casts, Ur, LPF, POC: NEGATIVE
Crystals, Ur, HPF, POC: NEGATIVE
Mucus, UA: NEGATIVE
Yeast, UA: NEGATIVE

## 2013-06-19 NOTE — Progress Notes (Signed)
Pt came in for lab  only 

## 2013-06-19 NOTE — Addendum Note (Signed)
Addended by: Earlene Plater on: 06/19/2013 10:41 AM   Modules accepted: Orders

## 2013-06-20 NOTE — Telephone Encounter (Signed)
Left message to call back  

## 2013-06-21 LAB — HEPATIC FUNCTION PANEL
ALT: 20 IU/L (ref 0–44)
AST: 20 IU/L (ref 0–40)
Albumin: 4.6 g/dL (ref 3.5–4.7)
Alkaline Phosphatase: 60 IU/L (ref 39–117)
BILIRUBIN TOTAL: 0.7 mg/dL (ref 0.0–1.2)
Bilirubin, Direct: 0.2 mg/dL (ref 0.00–0.40)
Total Protein: 6.1 g/dL (ref 6.0–8.5)

## 2013-06-21 LAB — NMR, LIPOPROFILE
Cholesterol: 113 mg/dL (ref <200)
HDL Cholesterol by NMR: 38 mg/dL — ABNORMAL LOW (ref >=40)
HDL Particle Number: 26 umol/L — ABNORMAL LOW (ref >=30.5)
LDL Particle Number: 933 nmol/L (ref <1000)
LDL SIZE: 20.2 nm (ref >20.5)
LDLC SERPL CALC-MCNC: 59 mg/dL (ref <100)
LP-IR Score: 36 (ref <=45)
SMALL LDL PARTICLE NUMBER: 570 nmol/L — AB (ref <=527)
TRIGLYCERIDES BY NMR: 80 mg/dL (ref <150)

## 2013-06-21 LAB — BMP8+EGFR
BUN / CREAT RATIO: 13 (ref 10–22)
BUN: 16 mg/dL (ref 8–27)
CALCIUM: 9.2 mg/dL (ref 8.6–10.2)
CO2: 26 mmol/L (ref 18–29)
CREATININE: 1.2 mg/dL (ref 0.76–1.27)
Chloride: 102 mmol/L (ref 97–108)
GFR, EST AFRICAN AMERICAN: 62 mL/min/{1.73_m2} (ref >59)
GFR, EST NON AFRICAN AMERICAN: 54 mL/min/{1.73_m2} — AB (ref >59)
Glucose: 95 mg/dL (ref 65–99)
Potassium: 4.8 mmol/L (ref 3.5–5.2)
SODIUM: 144 mmol/L (ref 134–144)

## 2013-06-21 LAB — VITAMIN D 25 HYDROXY (VIT D DEFICIENCY, FRACTURES): Vit D, 25-Hydroxy: 33.5 ng/mL (ref 30.0–100.0)

## 2013-06-21 LAB — TESTOSTERONE,FREE AND TOTAL
Testosterone, Free: 8.5 pg/mL (ref 6.6–18.1)
Testosterone: 785 ng/dL (ref 348–1197)

## 2013-06-21 LAB — URINE CULTURE

## 2013-06-22 ENCOUNTER — Other Ambulatory Visit: Payer: Self-pay | Admitting: Family Medicine

## 2013-06-22 DIAGNOSIS — R300 Dysuria: Secondary | ICD-10-CM

## 2013-06-22 MED ORDER — CIPROFLOXACIN HCL 500 MG PO TABS
500.0000 mg | ORAL_TABLET | Freq: Two times a day (BID) | ORAL | Status: DC
Start: 2013-06-22 — End: 2013-10-30

## 2013-06-22 MED ORDER — CIPROFLOXACIN HCL 500 MG PO TABS: 500.0000 mg | ORAL_TABLET | Freq: Two times a day (BID) | ORAL | Status: DC

## 2013-06-24 ENCOUNTER — Encounter: Payer: Self-pay | Admitting: Family Medicine

## 2013-06-24 ENCOUNTER — Ambulatory Visit (INDEPENDENT_AMBULATORY_CARE_PROVIDER_SITE_OTHER): Payer: Medicare Other | Admitting: Family Medicine

## 2013-06-24 VITALS — BP 129/54 | HR 63 | Temp 96.8°F | Ht 68.0 in | Wt 166.0 lb

## 2013-06-24 DIAGNOSIS — I2510 Atherosclerotic heart disease of native coronary artery without angina pectoris: Secondary | ICD-10-CM | POA: Insufficient documentation

## 2013-06-24 DIAGNOSIS — I7090 Unspecified atherosclerosis: Secondary | ICD-10-CM

## 2013-06-24 DIAGNOSIS — E785 Hyperlipidemia, unspecified: Secondary | ICD-10-CM

## 2013-06-24 DIAGNOSIS — E291 Testicular hypofunction: Secondary | ICD-10-CM

## 2013-06-24 DIAGNOSIS — S5000XA Contusion of unspecified elbow, initial encounter: Secondary | ICD-10-CM

## 2013-06-24 DIAGNOSIS — Z8551 Personal history of malignant neoplasm of bladder: Secondary | ICD-10-CM

## 2013-06-24 DIAGNOSIS — E349 Endocrine disorder, unspecified: Secondary | ICD-10-CM | POA: Insufficient documentation

## 2013-06-24 DIAGNOSIS — N390 Urinary tract infection, site not specified: Secondary | ICD-10-CM

## 2013-06-24 DIAGNOSIS — I119 Hypertensive heart disease without heart failure: Secondary | ICD-10-CM

## 2013-06-24 DIAGNOSIS — S5002XA Contusion of left elbow, initial encounter: Secondary | ICD-10-CM

## 2013-06-24 LAB — POCT CBC
Granulocyte percent: 47.9 %G (ref 37–80)
HCT, POC: 36.9 % — AB (ref 43.5–53.7)
Hemoglobin: 12.1 g/dL — AB (ref 14.1–18.1)
LYMPH, POC: 4.3 — AB (ref 0.6–3.4)
MCH: 31.1 pg (ref 27–31.2)
MCHC: 32.7 g/dL (ref 31.8–35.4)
MCV: 95.2 fL (ref 80–97)
MPV: 8 fL (ref 0–99.8)
POC Granulocyte: 4.5 (ref 2–6.9)
POC LYMPH PERCENT: 46 %L (ref 10–50)
Platelet Count, POC: 204 10*3/uL (ref 142–424)
RBC: 3.9 M/uL — AB (ref 4.69–6.13)
RDW, POC: 19.8 %
WBC: 9.4 10*3/uL (ref 4.6–10.2)

## 2013-06-24 NOTE — Telephone Encounter (Signed)
Never received a call back

## 2013-06-24 NOTE — Progress Notes (Signed)
Subjective:    Patient ID: Johnathan Strong, male    DOB: August 31, 1923, 80 y.o.   MRN: [id number]  HPI Pt here for follow up and management of chronic medical problems. A recent urine was checked and it revealed pyuria and hematuria. The urine culture was positive and an antibiotic was called in to the drugstore. The patient has a history of chronic lymphocytic leukemia. His lab where will be reviewed with him today and he has already started the Cipro. The CBC was not found and the lab work and we are checking on this. The CBC will be repeated today. The patient has an appointment tomorrow with his cardiologist. He has an oncology appointment in May he. He has a urological appointment in July.        Patient Active Problem List   Diagnosis Date Noted  . High risk medication use 02/20/2013  . Hyperlipemia 10/21/2012  . Unstable angina 09/27/2012  . CAD (coronary artery disease)   . DOE (dyspnea on exertion) 08/02/2012  . Dizziness 08/02/2012  . Hypertension 12/15/2010  . Chronic lymphoblastic leukemia 12/15/2010  . COPD GOLD III 08/30/2010   Outpatient Encounter Prescriptions as of 06/24/2013  Medication Sig  . albuterol (VENTOLIN HFA) 108 (90 BASE) MCG/ACT inhaler Inhale 2 puffs into the lungs every 6 (six) hours as needed for wheezing.   . Azelastine HCl (ASTELIN NA) Place 1 spray into the nose as needed (for congestion).   . carvedilol (COREG) 12.5 MG tablet TAKE  (1)  TABLET TWICE A DAY.  [name] Cholecalciferol (VITAMIN D3) 1000 UNITS CAPS Take 1 capsule by mouth daily.   . cimetidine (TAGAMET) 300 MG tablet TAKE 1 TABLET UP TO 4 TIMES A DAY  . ciprofloxacin (CIPRO) 500 MG tablet Take 1 tablet (500 mg total) by mouth 2 (two) times daily.  . clopidogrel (PLAVIX) 75 MG tablet Take 1 tablet (75 mg total) by mouth daily.  [name] DEPO-TESTOSTERONE 200 MG/ML injection INJECT 1ML IM MONTHLY  . diphenhydramine-acetaminophen (TYLENOL PM) 25-500 MG TABS Take 1 tablet by mouth at bedtime as needed.  sleep  . fluticasone (FLONASE) 50 MCG/ACT nasal spray 2 SPRAYS INTO EACH NOSTRIL ONCE A DAY  . furosemide (LASIX) 20 MG tablet Take 20 mg by mouth daily as needed for edema.   [name] LIPITOR 10 MG tablet Take 1 tablet (10 mg total) by mouth daily.  [name] losartan (COZAAR) 50 MG tablet TAKE  (1)  TABLET TWICE A DAY.  . montelukast (SINGULAIR) 10 MG tablet TAKE ONE TABLET AT BEDTIME  . nitroGLYCERIN (NITROSTAT) 0.4 MG SL tablet Place 1 tablet (0.4 mg total) under the tongue every 5 (five) minutes as needed for chest pain.  . NORVASC 5 MG tablet TAKE UP TO 2 TABLETS ONCE DAILY AS DIRECTED  . Omega-3 Fatty Acids (FISH OIL) 1200 MG CAPS Take 1 capsule by mouth daily.   . SYMBICORT 160-4.5 MCG/ACT inhaler 2 PUFFS EVERY 12 HOURS  . tiotropium (SPIRIVA) 18 MCG inhalation capsule Place 1 capsule (18 mcg total) into inhaler and inhale daily.  . traMADol (ULTRAM) 50 MG tablet TAKE 1 TABLET UP TO TWICE DAILY  . triamcinolone cream (KENALOG) 0.1 % APPLY TO AFFECTED AREAS 2 TIMES A DAY AS NEEDED  . VIBRAMYCIN 100 MG capsule TAKE AS DIRECTED.  . [DISCONTINUED] testosterone cypionate (DEPOTESTOTERONE CYPIONATE) injection 200 mg     Review of Systems  Constitutional: Negative.   HENT: Negative.   Eyes: Negative.   Respiratory: Negative.   Cardiovascular: Negative.  Gastrointestinal: Negative.   Endocrine: Negative.   Genitourinary: Negative.   Musculoskeletal: Positive for arthralgias (thumbs).  Skin: Negative.   Allergic/Immunologic: Negative.   Neurological: Negative.   Hematological: Negative.   Psychiatric/Behavioral: Negative.        Objective:   Physical Exam  Nursing note and vitals reviewed. Constitutional: He is oriented to person, place, and time. He appears well-developed and well-nourished. No distress.  For his age of 44 years  HENT:  Head: Normocephalic and atraumatic.  Right Ear: External ear normal.  Left Ear: External ear normal.  Nose: Nose normal.  Mouth/Throat: Oropharynx is  clear and moist. No oropharyngeal exudate.  Eyes: Conjunctivae and EOM are normal. Pupils are equal, round, and reactive to light. Right eye exhibits no discharge. Left eye exhibits no discharge. No scleral icterus.  Neck: Normal range of motion. Neck supple. No thyromegaly present.  No carotid bruit  Cardiovascular: Normal rate, regular rhythm, normal heart sounds and intact distal pulses.  Exam reveals no gallop and no friction rub.   No murmur heard. At 72 per minute  Pulmonary/Chest: Effort normal and breath sounds normal. No respiratory distress. He has no wheezes. He has no rales. He exhibits no tenderness.  Abdominal: Soft. Bowel sounds are normal. He exhibits no mass. There is no tenderness. There is no rebound and no guarding.  Musculoskeletal: Normal range of motion. He exhibits no edema and no tenderness.  Lymphadenopathy:    He has no cervical adenopathy.  Neurological: He is alert and oriented to person, place, and time. He has normal reflexes. No cranial nerve deficit.  Skin: Skin is warm and dry. No rash noted. No erythema. No pallor.  Patient has contusions of both hands and left elbow with an abrasion of the left elbow which was cleansed and dressed.  Psychiatric: He has a normal mood and affect. His behavior is normal. Judgment and thought content normal.   BP 129/54  Pulse 63  Temp(Src) 96.8 F (36 C) (Oral)  Ht 5\' 8"  (1.727 m)  Wt 166 lb (75.297 kg)  BMI 25.25 kg/m2        Assessment & Plan:  1. Hyperlipemia - POCT CBC  2. Hypertension - POCT CBC  3. CAD (coronary artery disease) - POCT CBC  4. Testosterone deficiency - POCT CBC  5. Personal history of bladder cancer  6. UTI (lower urinary tract infection)  7. ASCVD (arteriosclerotic cardiovascular disease)  8. Left elbow contusion  9. Chronic lymphocytic leukemia  Patient Instructions                       Medicare Annual Wellness Visit  [facility] and the medical providers at [facility] strive to bring you the best medical care.  In doing so we not only want to address your current medical conditions and concerns but also to detect new conditions early and prevent illness, disease and health-related problems.    Medicare offers a yearly Wellness Visit which allows our clinical staff to assess your need for preventative services including immunizations, lifestyle education, counseling to decrease risk of preventable diseases and screening for fall risk and other medical concerns.    This visit is provided free of charge (no copay) for all Medicare recipients. The clinical pharmacists at [facility] have begun to conduct these Wellness Visits which will also include a thorough review of all your medications.    As you primary medical provider recommend that you make  an appointment for your Annual Wellness Visit if you have not done so already this year.  You may set up this appointment before you leave today or you may call back ([phone]) and schedule an appointment.  Please make sure when you call that you mention that you are scheduling your Annual Wellness Visit with the clinical pharmacist so that the appointment may be made for the proper length of time.      Continue current medications. Continue good therapeutic lifestyle changes which include good diet and exercise. Fall precautions discussed with patient. If an FOBT was given today- please return it to our front desk. If you are over 63 years old - you may need Prevnar 35 or the adult Pneumonia vaccine.  Continue and complete antibiotic Take the lab reports with you to the doctor's visits We will call you with the results of the CBC was as results are completed Return the FOBT   Arrie Senate MD

## 2013-06-24 NOTE — Patient Instructions (Addendum)
Medicare Annual Wellness Visit  [facility] and the medical providers at [facility] strive to bring you the best medical care.  In doing so we not only want to address your current medical conditions and concerns but also to detect new conditions early and prevent illness, disease and health-related problems.    Medicare offers a yearly Wellness Visit which allows our clinical staff to assess your need for preventative services including immunizations, lifestyle education, counseling to decrease risk of preventable diseases and screening for fall risk and other medical concerns.    This visit is provided free of charge (no copay) for all Medicare recipients. The clinical pharmacists at [facility] have begun to conduct these Wellness Visits which will also include a thorough review of all your medications.    As you primary medical provider recommend that you make an appointment for your Annual Wellness Visit if you have not done so already this year.  You may set up this appointment before you leave today or you may call back ([phone]) and schedule an appointment.  Please make sure when you call that you mention that you are scheduling your Annual Wellness Visit with the clinical pharmacist so that the appointment may be made for the proper length of time.      Continue current medications. Continue good therapeutic lifestyle changes which include good diet and exercise. Fall precautions discussed with patient. If an FOBT was given today- please return it to our front desk. If you are over 21 years old - you may need Prevnar 62 or the adult Pneumonia vaccine.  Continue and complete antibiotic Take the lab reports with you to the doctor's visits We will call you with the results of the CBC was as results are completed Return the FOBT

## 2013-06-25 ENCOUNTER — Ambulatory Visit: Payer: Medicare Other | Admitting: Cardiology

## 2013-06-25 ENCOUNTER — Encounter: Payer: Self-pay | Admitting: Cardiology

## 2013-06-25 ENCOUNTER — Ambulatory Visit (INDEPENDENT_AMBULATORY_CARE_PROVIDER_SITE_OTHER): Payer: 59 | Admitting: Cardiology

## 2013-06-25 VITALS — BP 130/60 | HR 57 | Ht 68.0 in | Wt 168.0 lb

## 2013-06-25 DIAGNOSIS — I119 Hypertensive heart disease without heart failure: Secondary | ICD-10-CM

## 2013-06-25 DIAGNOSIS — I2510 Atherosclerotic heart disease of native coronary artery without angina pectoris: Secondary | ICD-10-CM

## 2013-06-25 DIAGNOSIS — R0609 Other forms of dyspnea: Secondary | ICD-10-CM

## 2013-06-25 DIAGNOSIS — R0989 Other specified symptoms and signs involving the circulatory and respiratory systems: Secondary | ICD-10-CM

## 2013-06-25 DIAGNOSIS — E785 Hyperlipidemia, unspecified: Secondary | ICD-10-CM

## 2013-06-25 DIAGNOSIS — I7090 Unspecified atherosclerosis: Secondary | ICD-10-CM

## 2013-06-25 NOTE — Assessment & Plan Note (Signed)
The patient is doing well.  He has not had any recurrent angina pectoris.  His shortness of breath remains much improved in comparison to prior to his angioplasty and stent.

## 2013-06-25 NOTE — Assessment & Plan Note (Signed)
Blood pressures remaining stable on current therapy.  No headaches.  No dizziness.  No symptoms of CHF.

## 2013-06-25 NOTE — Patient Instructions (Addendum)
Your physician recommends that you continue on your current medications as directed. Please refer to the Current Medication list given to you today.  Your physician wants you to follow-up in: 6 month ov/ekg You will receive a reminder letter in the mail two months in advance. If you don't receive a letter, please call our office to schedule the follow-up appointment.  

## 2013-06-25 NOTE — Progress Notes (Signed)
Johnathan Strong Date of Birth: 1923/08/22 Medical Record #[id number]  History of Present Illness: Johnathan Strong is seen today for followup office visit He presented in April 2014 with symptoms of dyspnea on exertion. Subsequent Myoview study showed significant inferior wall ischemia. He underwent cardiac catheterization on 08/27/2012. This demonstrated ostial occlusion of the right coronary with excellent left to right collateral flow. He had a high-grade proximal LAD stenosis. LV function was well-preserved. He is seen to discuss treatment options today. Patient does have a history of COPD Gold stage III, CLL, TIA, and hypertension. He is aspirin allergic.  He subsequently underwent successful DES stenting of his LAD on 09/26/12.  He will be on lifelong Plavix.  He is allergic to aspirin.  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  He had a P2Y12 assay prior to his stent placement which showed an excellent response of Plavix at 79 PR U The patient has a past history of dyslipidemia and has had good response to low-dose Lipitor 10 mg daily.  Since last visit he has been doing well.  He just returned from a vacation in [place].  He does have a recent urinary tract infection for which he is being treated with Cipro.  He also has a history of bladder cancer and is followed by Dr.Ottelin and a history of CLL for which he is followed by Dr. Broadus John 0. Moore at [facility].   Current Outpatient Prescriptions on File Prior to Visit  Medication Sig Dispense Refill  . albuterol (VENTOLIN HFA) 108 (90 BASE) MCG/ACT inhaler Inhale 2 puffs into the lungs every 6 (six) hours as needed for wheezing.       . Azelastine HCl (ASTELIN NA) Place 1 spray into the nose as needed (for congestion).       . Cholecalciferol (VITAMIN D3) 1000 UNITS CAPS Take 1 capsule by mouth daily.       . cimetidine (TAGAMET) 300 MG tablet TAKE 1 TABLET UP TO 4 TIMES A DAY  40  tablet  11  . ciprofloxacin (CIPRO) 500 MG tablet Take 1 tablet (500 mg total) by mouth 2 (two) times daily.  20 tablet  0  . clopidogrel (PLAVIX) 75 MG tablet Take 1 tablet (75 mg total) by mouth daily.  90 tablet  3  . DEPO-TESTOSTERONE 200 MG/ML injection INJECT 1ML IM MONTHLY  3 mL  1  . diphenhydramine-acetaminophen (TYLENOL PM) 25-500 MG TABS Take 1 tablet by mouth at bedtime as needed. sleep      . fluticasone (FLONASE) 50 MCG/ACT nasal spray 2 SPRAYS INTO EACH NOSTRIL ONCE A DAY  16 g  3  . furosemide (LASIX) 20 MG tablet Take 20 mg by mouth daily as needed for edema.       [name] LIPITOR 10 MG tablet Take 1 tablet (10 mg total) by mouth daily.  90 tablet  0  . losartan (COZAAR) 50 MG tablet TAKE  (1)  TABLET TWICE A DAY.  180 tablet  0  . montelukast (SINGULAIR) 10 MG tablet TAKE ONE TABLET AT BEDTIME  90 tablet  0  . nitroGLYCERIN (NITROSTAT) 0.4 MG SL tablet Place 1 tablet (0.4 mg total) under the tongue every 5 (five) minutes as needed for chest pain.  25 tablet  3  . NORVASC 5 MG tablet TAKE UP TO 2 TABLETS ONCE DAILY AS DIRECTED  180 tablet  0  . Omega-3 Fatty Acids (FISH  OIL) 1200 MG CAPS Take 1 capsule by mouth daily.       . SYMBICORT 160-4.5 MCG/ACT inhaler 2 PUFFS EVERY 12 HOURS  10.2 g  2  . tiotropium (SPIRIVA) 18 MCG inhalation capsule Place 1 capsule (18 mcg total) into inhaler and inhale daily.  30 capsule  12  . traMADol (ULTRAM) 50 MG tablet TAKE 1 TABLET UP TO TWICE DAILY  180 tablet  0  . triamcinolone cream (KENALOG) 0.1 % APPLY TO AFFECTED AREAS 2 TIMES A DAY AS NEEDED  45 g  3  . VIBRAMYCIN 100 MG capsule TAKE AS DIRECTED.  50 capsule  0   No current facility-administered medications on file prior to visit.    Allergies  Allergen Reactions  . Aspirin Anaphylaxis, Shortness Of Breath and Swelling  . Nsaids Anaphylaxis, Shortness Of Breath and Swelling  . Septra [Sulfamethoxazole-Tmp Ds] Rash  . Benicar [Olmesartan Medoxomil]     dizzy  . Salicylates     SOB  .  Ampicillin Rash    Past Medical History  Diagnosis Date  . Osteopenia 2006  . Hyperlipidemia   . BPH (benign prostatic hypertrophy)   . Transient global amnesia   . Osteoarthritis   . History of TIA (transient ischemic attack) 1980'S    NO RESIDUAL  . COPD (chronic obstructive pulmonary disease) with emphysema   . Chronic back pain   . Itching SECONDARY TO CLL-- CONTROLLED W/ SINGULAIR  . Frequency   . Nocturia   . Hypertension CARDIOLOGIST- DR Xzandria Clevinger-- LAST VISIT 12-15-2010 NOTE IN EPIC  . CLL (chronic lymphocytic leukemia) LAST PLT COUNT APRL 2012  168    Managed at [facility]  . Bladder cancer   . Aspirin allergy     Eyes swell  . Asthma   . Dyspnea on exertion   . CAD (coronary artery disease)     a. 08/2012 Cath: LM nl, LAD 90p, LCX 20-30, RCA 100ost fills via L->R collats, EF 55-65%;  b. 09/2012 PCI of prox LAD with 3.0x16 Promus DES.    Past Surgical History  Procedure Laterality Date  . Excisional hemorrhoidectomy  1980  . Cardiovascular stress test  07/2005    a. 07/2005- no reversible ischemia, normal EF b. 06/2009- EF 73%, no reversible ischemia, inferolateral TWIs at rest, upright in recovery c. 08/2012- mediuim-sized partially reversible basal to mid inferior and inferoseptal perfusion defect c/w with prior infarction and peri-infarct ischemia; EF 68% and borderline ST/T changes on stress ECG   . Transurethral resection of bladder tumor  10-19-2008    AND DILATION URETHRAL STRICTURE  . Cataract extraction w/ intraocular lens  implant, bilateral Bilateral ~ 2007  . Transthoracic echocardiogram  01-03-2011    LVEF 64-40%, grade 1 diastolic dysfunction, no WMAs or structural abnormalities  . Cystoscopy with biopsy  10/30/2011    Procedure: CYSTOSCOPY WITH BIOPSY;  Surgeon: Claybon Jabs, MD;  Location: [facility];  Service: Urology;  Laterality: N/A;  . Cardiac catheterization  08/27/2012  . Coronary angioplasty with stent placement   09/26/2012    "1" (09/26/2012)  . Coronary stent placement  09/26/12    History  Smoking status  . Former Smoker -- 1.00 packs/day for 25 years  . Types: Cigarettes  . Quit date: 03/06/1960  Smokeless tobacco  . Former Systems developer  . Quit date: 10/25/1978    History  Alcohol Use  . Yes    Comment: 09/26/2012 "glass of wine or mixed drink  once/month"    Family History  Problem Relation Age of Onset  . Emphysema Father   . Heart disease Father   . COPD Father   . Atrial fibrillation Father   . Kidney cancer Mother   . Kidney failure Mother     Review of Systems: As noted in history of present illness..  All other systems were reviewed and are negative.  Physical Exam: BP 130/60  Pulse 57  Ht 5\' 8"  (1.727 m)  Wt 168 lb (76.204 kg)  BMI 25.55 kg/m2 He is a pleasant elderly white male in no acute distress. HEENT: Normocephalic, atraumatic. Pupils equal round and reactive to light accommodation. Extraocular movements are full. Oropharynx is clear. Neck is supple no JVD, adenopathy, thyromegaly, or bruits. Lungs: Clear to auscultation and percussion. No wheezes. Cardiovascular: Regular rate and rhythm, normal S1 and S2, no gallop, murmur, or click. Abdomen: Soft and nontender. No hepatosplenomegaly or masses. Bowel sounds are positive. Extremities: No cyanosis, clubbing, or edema. Pedal pulses are 2+. Radial pulses are 2+. Skin: Warm and dry Neuro: Alert and oriented x3. Cranial nerves II through XII are intact.  EKG today shows sinus bradycardia with first degree AV block and no ischemic changes  Assessment / Plan: The patient is doing very well.  He will continue current therapy.  Recheck in 6 months for followup office visit and EKG

## 2013-06-25 NOTE — Assessment & Plan Note (Signed)
The patient is tolerating low dose atorvastatin 10 mg daily without adverse side effects such as myalgias

## 2013-06-26 ENCOUNTER — Other Ambulatory Visit: Payer: Self-pay | Admitting: Family Medicine

## 2013-07-03 ENCOUNTER — Other Ambulatory Visit (INDEPENDENT_AMBULATORY_CARE_PROVIDER_SITE_OTHER): Payer: Medicare Other

## 2013-07-03 DIAGNOSIS — N390 Urinary tract infection, site not specified: Secondary | ICD-10-CM

## 2013-07-03 LAB — POCT URINALYSIS DIPSTICK
BILIRUBIN UA: NEGATIVE
GLUCOSE UA: NEGATIVE
KETONES UA: NEGATIVE
Nitrite, UA: NEGATIVE
PH UA: 5
Protein, UA: NEGATIVE
RBC UA: NEGATIVE
SPEC GRAV UA: 1.01
Urobilinogen, UA: NEGATIVE

## 2013-07-03 LAB — POCT UA - MICROSCOPIC ONLY
Bacteria, U Microscopic: NEGATIVE
CASTS, UR, LPF, POC: NEGATIVE
CRYSTALS, UR, HPF, POC: NEGATIVE
MUCUS UA: NEGATIVE
RBC, URINE, MICROSCOPIC: NEGATIVE
Yeast, UA: NEGATIVE

## 2013-07-03 NOTE — Progress Notes (Signed)
Pt came in for labs only 

## 2013-07-04 LAB — URINE CULTURE: ORGANISM ID, BACTERIA: NO GROWTH

## 2013-07-05 ENCOUNTER — Other Ambulatory Visit: Payer: Self-pay | Admitting: Family Medicine

## 2013-07-09 ENCOUNTER — Telehealth: Payer: Self-pay | Admitting: Family Medicine

## 2013-07-10 NOTE — Telephone Encounter (Signed)
Pt aware.

## 2013-07-17 ENCOUNTER — Ambulatory Visit (INDEPENDENT_AMBULATORY_CARE_PROVIDER_SITE_OTHER): Payer: Medicare Other | Admitting: *Deleted

## 2013-07-17 DIAGNOSIS — E291 Testicular hypofunction: Secondary | ICD-10-CM

## 2013-07-17 MED ORDER — TESTOSTERONE CYPIONATE 200 MG/ML IM SOLN
200.0000 mg | INTRAMUSCULAR | Status: AC
  Administered 2013-07-17 – 2014-06-16 (×11): 200 mg via INTRAMUSCULAR

## 2013-07-17 NOTE — Progress Notes (Signed)
Patient tolerated well.

## 2013-07-22 ENCOUNTER — Other Ambulatory Visit: Payer: Medicare Other

## 2013-07-22 DIAGNOSIS — Z1212 Encounter for screening for malignant neoplasm of rectum: Secondary | ICD-10-CM

## 2013-07-24 ENCOUNTER — Other Ambulatory Visit: Payer: Self-pay | Admitting: Family Medicine

## 2013-07-24 LAB — FECAL OCCULT BLOOD, IMMUNOCHEMICAL: Fecal Occult Bld: NEGATIVE

## 2013-08-09 ENCOUNTER — Other Ambulatory Visit: Payer: Self-pay | Admitting: Family Medicine

## 2013-08-11 ENCOUNTER — Other Ambulatory Visit: Payer: Self-pay | Admitting: *Deleted

## 2013-08-12 ENCOUNTER — Ambulatory Visit: Payer: Medicare Other

## 2013-08-14 ENCOUNTER — Ambulatory Visit: Payer: Medicare Other

## 2013-08-18 ENCOUNTER — Ambulatory Visit (INDEPENDENT_AMBULATORY_CARE_PROVIDER_SITE_OTHER): Payer: Medicare Other | Admitting: Family Medicine

## 2013-08-18 DIAGNOSIS — E291 Testicular hypofunction: Secondary | ICD-10-CM

## 2013-08-18 DIAGNOSIS — E349 Endocrine disorder, unspecified: Secondary | ICD-10-CM

## 2013-09-11 ENCOUNTER — Other Ambulatory Visit (HOSPITAL_COMMUNITY): Payer: Self-pay | Admitting: Nurse Practitioner

## 2013-09-11 ENCOUNTER — Other Ambulatory Visit: Payer: Self-pay | Admitting: Family Medicine

## 2013-09-24 ENCOUNTER — Ambulatory Visit (INDEPENDENT_AMBULATORY_CARE_PROVIDER_SITE_OTHER): Payer: Medicare Other | Admitting: Family Medicine

## 2013-09-24 ENCOUNTER — Ambulatory Visit: Payer: Medicare Other

## 2013-09-24 DIAGNOSIS — E349 Endocrine disorder, unspecified: Secondary | ICD-10-CM

## 2013-09-24 DIAGNOSIS — E291 Testicular hypofunction: Secondary | ICD-10-CM

## 2013-09-24 NOTE — Patient Instructions (Signed)
Testosterone injection What is this medicine? TESTOSTERONE (tes TOS ter one) is the main male hormone. It supports normal male development such as muscle growth, facial hair, and deep voice. It is used in males to treat low testosterone levels. This medicine may be used for other purposes; ask your health care provider or pharmacist if you have questions. COMMON BRAND NAME(S): Andro-L.A., Aveed, Delatestryl, Depo-Testosterone, Virilon What should I tell my health care provider before I take this medicine? They need to know if you have any of these conditions: -breast cancer -diabetes -heart disease -kidney disease -liver disease -lung disease -prostate cancer, enlargement -an unusual or allergic reaction to testosterone, other medicines, foods, dyes, or preservatives -pregnant or trying to get pregnant -breast-feeding How should I use this medicine? This medicine is for injection into a muscle. It is usually given by a health care professional in a hospital or clinic setting. Contact your pediatrician regarding the use of this medicine in children. While this medicine may be prescribed for children as young as 32 years of age for selected conditions, precautions do apply. Overdosage: If you think you have taken too much of this medicine contact a poison control center or emergency room at once. NOTE: This medicine is only for you. Do not share this medicine with others. What if I miss a dose? Try not to miss a dose. Your doctor or health care professional will tell you when your next injection is due. Notify the office if you are unable to keep an appointment. What may interact with this medicine? -medicines for diabetes -medicines that treat or prevent blood clots like warfarin -oxyphenbutazone -propranolol -steroid medicines like prednisone or cortisone This list may not describe all possible interactions. Give your health care provider a list of all the medicines, herbs,  non-prescription drugs, or dietary supplements you use. Also tell them if you smoke, drink alcohol, or use illegal drugs. Some items may interact with your medicine. What should I watch for while using this medicine? Visit your doctor or health care professional for regular checks on your progress. They will need to check the level of testosterone in your blood. This medicine may affect blood sugar levels. If you have diabetes, check with your doctor or health care professional before you change your diet or the dose of your diabetic medicine. This drug is banned from use in athletes by most athletic organizations. What side effects may I notice from receiving this medicine? Side effects that you should report to your doctor or health care professional as soon as possible: -allergic reactions like skin rash, itching or hives, swelling of the face, lips, or tongue -breast enlargement -breathing problems -changes in mood, especially anger, depression, or rage -dark urine -general ill feeling or flu-like symptoms -light-colored stools -loss of appetite, nausea -nausea, vomiting -right upper belly pain -stomach pain -swelling of ankles -too frequent or persistent erections -trouble passing urine or change in the amount of urine -unusually weak or tired -yellowing of the eyes or skin Additional side effects that can occur in women include: -deep or hoarse voice -facial hair growth -irregular menstrual periods Side effects that usually do not require medical attention (report to your doctor or health care professional if they continue or are bothersome): -acne -change in sex drive or performance -hair loss -headache This list may not describe all possible side effects. Call your doctor for medical advice about side effects. You may report side effects to FDA at 1-800-FDA-1088. Where should I keep my medicine? Keep  out of the reach of children. This medicine can be abused. Keep your  medicine in a safe place to protect it from theft. Do not share this medicine with anyone. Selling or giving away this medicine is dangerous and against the law. Store at room temperature between 20 and 25 degrees C (68 and 77 degrees F). Do not freeze. Protect from light. Follow the directions for the product you are prescribed. Throw away any unused medicine after the expiration date. NOTE: This sheet is a summary. It may not cover all possible information. If you have questions about this medicine, talk to your doctor, pharmacist, or health care provider.  2015, Elsevier/Gold Standard. (2007-05-03 16:13:46)

## 2013-09-24 NOTE — Progress Notes (Signed)
Testosterone given and tolerated well

## 2013-10-27 ENCOUNTER — Ambulatory Visit (INDEPENDENT_AMBULATORY_CARE_PROVIDER_SITE_OTHER): Payer: Medicare Other | Admitting: *Deleted

## 2013-10-27 DIAGNOSIS — E349 Endocrine disorder, unspecified: Secondary | ICD-10-CM

## 2013-10-27 DIAGNOSIS — E291 Testicular hypofunction: Secondary | ICD-10-CM

## 2013-10-27 NOTE — Progress Notes (Signed)
Patient ID: Johnathan Strong, male   DOB: 1923-11-28, 80 y.o.   MRN: [id number] Testosterone injection given and tolerated well.

## 2013-10-27 NOTE — Patient Instructions (Signed)
Testosterone This test is used to determine if your testosterone level is abnormal. This could be used to explain difficulty getting an erection (erectile dysfunction), inability of your partner to get pregnant (infertility), premature or delayed puberty if you are male, or the appearance of masculine physical features if you are male. PREPARATION FOR TEST A blood sample is obtained by inserting a needle into a vein in the arm. NORMAL FINDINGS  Free Testosterone: 0.3-2 pg/mL  % Free Testosterone: 0.1%-0.3% Total Testosterone:  7 mos-9 yrs (Tanner Stage I)  Male: Less than 30 ng/dL  Male: Less than 30 ng/dL  10-13 yrs (Tanner Stage II)  Male: Less than 300 ng/dL  Male: Less than 40 ng/dL  14-15 yrs (Tanner Stage III)  Male: 170-540 ng/dL  Male: Less than 60 ng/dL  16-19 yrs (Tanner Stage IV, V)  Male: 250-910 ng/dL  Male: Less than 70 ng/dL  20 yrs and over  Male: 280-1080 ng/dL  Male: Less than 70 ng/dL Ranges for normal findings may vary among different laboratories and hospitals. You should always check with your doctor after having lab work or other tests done to discuss the meaning of your test results and whether your values are considered within normal limits. MEANING OF TEST  Your caregiver will go over the test results with you and discuss the importance and meaning of your results, as well as treatment options and the need for additional tests if necessary. OBTAINING THE TEST RESULTS It is your responsibility to obtain your test results. Ask the lab or department performing the test when and how you will get your results. Document Released: 03/09/2004 Document Revised: 05/15/2011 Document Reviewed: 06/18/2013 ExitCare Patient Information 2015 [employer]. This information is not intended to replace advice given to you by your health care provider. Make sure you discuss any questions you have with your health care provider.  

## 2013-10-28 ENCOUNTER — Other Ambulatory Visit: Payer: Self-pay | Admitting: Family Medicine

## 2013-10-30 ENCOUNTER — Ambulatory Visit (INDEPENDENT_AMBULATORY_CARE_PROVIDER_SITE_OTHER): Payer: Medicare Other | Admitting: Family Medicine

## 2013-10-30 ENCOUNTER — Encounter: Payer: Self-pay | Admitting: Family Medicine

## 2013-10-30 VITALS — BP 135/56 | HR 56 | Temp 96.6°F | Ht 68.0 in | Wt 163.0 lb

## 2013-10-30 DIAGNOSIS — Z79899 Other long term (current) drug therapy: Secondary | ICD-10-CM

## 2013-10-30 DIAGNOSIS — I119 Hypertensive heart disease without heart failure: Secondary | ICD-10-CM

## 2013-10-30 DIAGNOSIS — J449 Chronic obstructive pulmonary disease, unspecified: Secondary | ICD-10-CM

## 2013-10-30 DIAGNOSIS — IMO0001 Reserved for inherently not codable concepts without codable children: Secondary | ICD-10-CM

## 2013-10-30 DIAGNOSIS — I7090 Unspecified atherosclerosis: Secondary | ICD-10-CM

## 2013-10-30 DIAGNOSIS — E785 Hyperlipidemia, unspecified: Secondary | ICD-10-CM

## 2013-10-30 DIAGNOSIS — E291 Testicular hypofunction: Secondary | ICD-10-CM

## 2013-10-30 DIAGNOSIS — E349 Endocrine disorder, unspecified: Secondary | ICD-10-CM

## 2013-10-30 DIAGNOSIS — I2510 Atherosclerotic heart disease of native coronary artery without angina pectoris: Secondary | ICD-10-CM

## 2013-10-30 DIAGNOSIS — E559 Vitamin D deficiency, unspecified: Secondary | ICD-10-CM

## 2013-10-30 DIAGNOSIS — C9110 Chronic lymphocytic leukemia of B-cell type not having achieved remission: Secondary | ICD-10-CM

## 2013-10-30 LAB — POCT CBC
Granulocyte percent: 40.7 %G (ref 37–80)
HCT, POC: 35.6 % — AB (ref 43.5–53.7)
HEMOGLOBIN: 11.8 g/dL — AB (ref 14.1–18.1)
Lymph, poc: 5.3 — AB (ref 0.6–3.4)
MCH, POC: 31.8 pg — AB (ref 27–31.2)
MCHC: 33.3 g/dL (ref 31.8–35.4)
MCV: 95.7 fL (ref 80–97)
MPV: 8.1 fL (ref 0–99.8)
POC Granulocyte: 3.8 (ref 2–6.9)
POC LYMPH %: 56.7 % — AB (ref 10–50)
Platelet Count, POC: 177 10*3/uL (ref 142–424)
RBC: 3.7 M/uL — AB (ref 4.69–6.13)
RDW, POC: 19.4 %
WBC: 9.7 10*3/uL (ref 4.6–10.2)

## 2013-10-30 MED ORDER — TRAMADOL HCL 50 MG PO TABS: ORAL_TABLET | ORAL | Status: DC

## 2013-10-30 NOTE — Progress Notes (Signed)
Subjective:    Patient ID: Johnathan Strong, male    DOB: [birth year], 80 y.o.   MRN: [id number]  HPI Pt here for follow up and management of chronic medical problems. The patient is alert and doing well for his age. He is followed by the urologist every 6 months for his bladder cancer. He sees the hematologist yearly for his chronic lymphocytic leukemia. He sees the cardiologist for his ASCVD status post a stent one year ago.        Patient Active Problem List   Diagnosis Date Noted  . Testosterone deficiency 06/24/2013  . Personal history of bladder cancer 06/24/2013  . ASCVD (arteriosclerotic cardiovascular disease) 06/24/2013  . High risk medication use 02/20/2013  . Hyperlipemia 10/21/2012  . Unstable angina 09/27/2012  . CAD (coronary artery disease)   . DOE (dyspnea on exertion) 08/02/2012  . Dizziness 08/02/2012  . Hypertension 12/15/2010  . Chronic lymphocytic leukemia 12/15/2010  . COPD GOLD III 08/30/2010   Outpatient Encounter Prescriptions as of 10/30/2013  Medication Sig  . albuterol (VENTOLIN HFA) 108 (90 BASE) MCG/ACT inhaler Inhale 2 puffs into the lungs every 6 (six) hours as needed for wheezing.   . Azelastine HCl (ASTELIN NA) Place 1 spray into the nose as needed (for congestion).   [name] b complex vitamins tablet Take 3 tablets by mouth daily.  . carvedilol (COREG) 12.5 MG tablet TAKE ONE-HALF TABLET (6.25MG) IN THE MORNING AND ONE TABLET(12.5) AT NIGHT BY MOUTH DAILY  . Cholecalciferol (VITAMIN D3) 1000 UNITS CAPS Take 1 capsule by mouth daily.   . cimetidine (TAGAMET) 300 MG tablet TAKE 1 TABLET UP TO 4 TIMES A DAY  . clopidogrel (PLAVIX) 75 MG tablet TAKE 1 TABLET ONCE A DAY  . DEPO-TESTOSTERONE 200 MG/ML injection INJECT 1ML IM MONTHLY  . diphenhydramine-acetaminophen (TYLENOL PM) 25-500 MG TABS Take 1 tablet by mouth at bedtime as needed. sleep  . fluticasone (FLONASE) 50 MCG/ACT nasal spray 2 SPRAYS INTO EACH NOSTRIL ONCE A DAY  . furosemide (LASIX) 20 MG  tablet Take 20 mg by mouth daily as needed for edema.   [name] LIPITOR 10 MG tablet TAKE 1 TABLET DAILY  . losartan (COZAAR) 50 MG tablet TAKE (1) TABLET TWICE A DAY.  . montelukast (SINGULAIR) 10 MG tablet TAKE ONE TABLET AT BEDTIME  . nitroGLYCERIN (NITROSTAT) 0.4 MG SL tablet Place 1 tablet (0.4 mg total) under the tongue every 5 (five) minutes as needed for chest pain.  . NORVASC 5 MG tablet TAKE UP TO 2 TABLETS ONCE DAILY AS DIRECTED  . Omega-3 Fatty Acids (FISH OIL) 1200 MG CAPS Take 1 capsule by mouth daily.   [name] SPIRIVA HANDIHALER 18 MCG inhalation capsule INHALE THE CONTENTS OF ONE CAPSULE ONCE DAILY AS DIRECTED  . SYMBICORT 160-4.5 MCG/ACT inhaler 2 PUFFS EVERY 12 HOURS  . traMADol (ULTRAM) 50 MG tablet TAKE 1 TABLET UP TO TWICE DAILY  . triamcinolone cream (KENALOG) 0.1 % APPLY TO AFFECTED AREAS 2 TIMES A DAY AS NEEDED  . VIBRAMYCIN 100 MG capsule TAKE AS DIRECTED.  . [DISCONTINUED] ciprofloxacin (CIPRO) 500 MG tablet Take 1 tablet (500 mg total) by mouth 2 (two) times daily.  . [DISCONTINUED] tiotropium (SPIRIVA) 18 MCG inhalation capsule Place 1 capsule (18 mcg total) into inhaler and inhale daily.    Review of Systems  Constitutional: Negative.   HENT: Negative.   Eyes: Negative.   Respiratory: Negative.   Cardiovascular: Negative.   Gastrointestinal: Negative.   Endocrine: Negative.   Genitourinary:  Negative.   Musculoskeletal: Negative.   Skin: Negative.   Allergic/Immunologic: Negative.   Neurological: Negative.   Hematological: Negative.   Psychiatric/Behavioral: Negative.        Objective:   Physical Exam  Nursing note and vitals reviewed. Constitutional: He is oriented to person, place, and time. He appears well-developed and well-nourished. No distress.  The patient is alert and looks much younger than his stated age of 52  HENT:  Head: Normocephalic and atraumatic.  Right Ear: External ear normal.  Left Ear: External ear normal.  Nose: Nose normal.    Mouth/Throat: Oropharynx is clear and moist. No oropharyngeal exudate.  Minimal ear cerumen bilaterally  Eyes: Conjunctivae and EOM are normal. Pupils are equal, round, and reactive to light. Right eye exhibits no discharge. Left eye exhibits no discharge. No scleral icterus.  Neck: Normal range of motion. Neck supple. No thyromegaly present.  No adenopathy or carotid bruits  Cardiovascular: Normal rate, regular rhythm, normal heart sounds and intact distal pulses.  Exam reveals no gallop and no friction rub.   No murmur heard. The heart has a regular rate and rhythm at 60 per minute  Pulmonary/Chest: Effort normal and breath sounds normal. No respiratory distress. He has no wheezes. He has no rales. He exhibits no tenderness.  Questionable atelectasis right base posteriorly which resolved after deep breath and cough  Abdominal: Soft. Bowel sounds are normal. He exhibits no mass. There is no tenderness. There is no rebound and no guarding.  Musculoskeletal: Normal range of motion. He exhibits no edema and no tenderness.  Lymphadenopathy:    He has no cervical adenopathy.  Neurological: He is alert and oriented to person, place, and time. He has normal reflexes. No cranial nerve deficit.  Skin: Skin is warm and dry. No rash noted. No erythema. No pallor.  Psychiatric: He has a normal mood and affect. His behavior is normal. Judgment and thought content normal.   BP 135/56  Pulse 56  Temp(Src) 96.6 F (35.9 C) (Oral)  Ht '5\' 8"'  (1.727 m)  Wt 163 lb (73.936 kg)  BMI 24.79 kg/m2        Assessment & Plan:   1. ASCVD (arteriosclerotic cardiovascular disease) - POCT CBC  2. Chronic obstructive pulmonary disease, unspecified COPD, unspecified chronic bronchitis type - POCT CBC  3. Hypertension - POCT CBC - BMP8+EGFR - Hepatic function panel  4. Hyperlipemia - POCT CBC - Lipid panel  5. High risk medication use - POCT CBC  6. Testosterone deficiency - POCT CBC  7. Vitamin  D deficiency - Vit D  25 hydroxy (rtn osteoporosis monitoring)  8. Chronic lymphocytic leukemia   Meds ordered this encounter  Medications  . b complex vitamins tablet    Sig: Take 3 tablets by mouth daily.  . traMADol (ULTRAM) 50 MG tablet    Sig: TAKE 1 TABLET UP TO TWICE DAILY    Dispense:  60 tablet    Refill:  0   Patient Instructions                       Medicare Annual Wellness Visit  [facility] and the medical providers at [facility] strive to bring you the best medical care.  In doing so we not only want to address your current medical conditions and concerns but also to detect new conditions early and prevent illness, disease and health-related problems.    Medicare offers a yearly Wellness Visit which allows our  clinical staff to assess your need for preventative services including immunizations, lifestyle education, counseling to decrease risk of preventable diseases and screening for fall risk and other medical concerns.    This visit is provided free of charge (no copay) for all Medicare recipients. The clinical pharmacists at [facility] have begun to conduct these Wellness Visits which will also include a thorough review of all your medications.    As you primary medical provider recommend that you make an appointment for your Annual Wellness Visit if you have not done so already this year.  You may set up this appointment before you leave today or you may call back ([phone]) and schedule an appointment.  Please make sure when you call that you mention that you are scheduling your Annual Wellness Visit with the clinical pharmacist so that the appointment may be made for the proper length of time.      Continue current medications. Continue good therapeutic lifestyle changes which include good diet and exercise. Fall precautions discussed with patient. If an FOBT was given today- please return it to our front desk. If  you are over 30 years old - you may need Prevnar 75 or the adult Pneumonia vaccine.  Flu Shots will be available at our office starting mid- September. Please call and schedule a FLU CLINIC APPOINTMENT.   Keep appointments with urology, cardiology, hematology, and dermatology as needed. Continue to drink plenty of fluids We will call you with your lab work results once those results are available   Arrie Senate MD

## 2013-10-30 NOTE — Patient Instructions (Addendum)
Medicare Annual Wellness Visit  [facility] and the medical providers at [facility] strive to bring you the best medical care.  In doing so we not only want to address your current medical conditions and concerns but also to detect new conditions early and prevent illness, disease and health-related problems.    Medicare offers a yearly Wellness Visit which allows our clinical staff to assess your need for preventative services including immunizations, lifestyle education, counseling to decrease risk of preventable diseases and screening for fall risk and other medical concerns.    This visit is provided free of charge (no copay) for all Medicare recipients. The clinical pharmacists at [facility] have begun to conduct these Wellness Visits which will also include a thorough review of all your medications.    As you primary medical provider recommend that you make an appointment for your Annual Wellness Visit if you have not done so already this year.  You may set up this appointment before you leave today or you may call back ([phone]) and schedule an appointment.  Please make sure when you call that you mention that you are scheduling your Annual Wellness Visit with the clinical pharmacist so that the appointment may be made for the proper length of time.      Continue current medications. Continue good therapeutic lifestyle changes which include good diet and exercise. Fall precautions discussed with patient. If an FOBT was given today- please return it to our front desk. If you are over 80 years old - you may need Prevnar 19 or the adult Pneumonia vaccine.  Flu Shots will be available at our office starting mid- September. Please call and schedule a FLU CLINIC APPOINTMENT.   Keep appointments with urology, cardiology, hematology, and dermatology as needed. Continue to drink plenty of fluids We will call you with your lab  work results once those results are available

## 2013-10-31 LAB — HEPATIC FUNCTION PANEL
ALK PHOS: 51 IU/L (ref 39–117)
ALT: 22 IU/L (ref 0–44)
AST: 19 IU/L (ref 0–40)
Albumin: 5 g/dL — ABNORMAL HIGH (ref 3.5–4.7)
BILIRUBIN TOTAL: 1 mg/dL (ref 0.0–1.2)
Bilirubin, Direct: 0.26 mg/dL (ref 0.00–0.40)
TOTAL PROTEIN: 6.6 g/dL (ref 6.0–8.5)

## 2013-10-31 LAB — BMP8+EGFR
BUN / CREAT RATIO: 13 (ref 10–22)
BUN: 15 mg/dL (ref 8–27)
CO2: 26 mmol/L (ref 18–29)
CREATININE: 1.15 mg/dL (ref 0.76–1.27)
Calcium: 9.3 mg/dL (ref 8.6–10.2)
Chloride: 98 mmol/L (ref 97–108)
GFR calc Af Amer: 65 mL/min/{1.73_m2} (ref >59)
GFR calc non Af Amer: 56 mL/min/{1.73_m2} — ABNORMAL LOW (ref >59)
Glucose: 93 mg/dL (ref 65–99)
Potassium: 4.9 mmol/L (ref 3.5–5.2)
Sodium: 139 mmol/L (ref 134–144)

## 2013-10-31 LAB — LIPID PANEL
Chol/HDL Ratio: 2.4 ratio units (ref 0.0–5.0)
Cholesterol, Total: 107 mg/dL (ref 100–199)
HDL: 45 mg/dL (ref >39)
LDL Calculated: 47 mg/dL (ref 0–99)
Triglycerides: 77 mg/dL (ref 0–149)
VLDL Cholesterol Cal: 15 mg/dL (ref 5–40)

## 2013-10-31 LAB — VITAMIN D 25 HYDROXY (VIT D DEFICIENCY, FRACTURES): VIT D 25 HYDROXY: 38.9 ng/mL (ref 30.0–100.0)

## 2013-11-04 ENCOUNTER — Telehealth: Payer: Self-pay | Admitting: *Deleted

## 2013-11-04 NOTE — Telephone Encounter (Signed)
Message copied by Priscille Heidelberg on Tue Nov 04, 2013 10:54 AM ------      Message from: Chipper Herb      Created: Fri Oct 31, 2013 10:15 AM       The blood sugar was good at 93. The creatinine, the most important kidney function test is within normal limits. The electrolytes including potassium are within normal limits      Liver function tests are within normal limits except the albumin is slightly elevated and this has been consistent with past readings      All cholesterol numbers with traditional lipid testing or good and at goal, continue current treatment and aggressive therapeutic lifestyle changes       the vitamin D level is within normal limits, continue current treatment ------

## 2013-11-15 ENCOUNTER — Other Ambulatory Visit: Payer: Self-pay | Admitting: Family Medicine

## 2013-11-27 ENCOUNTER — Other Ambulatory Visit: Payer: Self-pay | Admitting: Family Medicine

## 2013-11-28 ENCOUNTER — Other Ambulatory Visit (HOSPITAL_COMMUNITY): Payer: Self-pay | Admitting: Cardiology

## 2013-11-28 ENCOUNTER — Ambulatory Visit (INDEPENDENT_AMBULATORY_CARE_PROVIDER_SITE_OTHER): Payer: Medicare Other | Admitting: *Deleted

## 2013-11-28 DIAGNOSIS — E291 Testicular hypofunction: Secondary | ICD-10-CM

## 2013-11-28 DIAGNOSIS — E349 Endocrine disorder, unspecified: Secondary | ICD-10-CM

## 2013-11-28 NOTE — Telephone Encounter (Signed)
Is is okay to refill? 

## 2013-11-28 NOTE — Progress Notes (Signed)
Patient ID: Johnathan Strong, male   DOB: 1923/04/20, 80 y.o.   MRN: [id number] Testosterone injection given and tolerated well.

## 2013-11-28 NOTE — Patient Instructions (Signed)

## 2013-11-28 NOTE — Telephone Encounter (Signed)
After approved., route to pool A, so they can call into [facility]

## 2013-12-01 NOTE — Telephone Encounter (Signed)
Called in.

## 2013-12-09 ENCOUNTER — Ambulatory Visit (INDEPENDENT_AMBULATORY_CARE_PROVIDER_SITE_OTHER): Payer: Medicare Other

## 2013-12-09 DIAGNOSIS — Z23 Encounter for immunization: Secondary | ICD-10-CM

## 2013-12-17 ENCOUNTER — Other Ambulatory Visit: Payer: Self-pay | Admitting: Dermatology

## 2013-12-31 ENCOUNTER — Ambulatory Visit: Payer: Medicare Other

## 2014-01-05 ENCOUNTER — Ambulatory Visit (INDEPENDENT_AMBULATORY_CARE_PROVIDER_SITE_OTHER): Payer: Medicare Other | Admitting: *Deleted

## 2014-01-05 DIAGNOSIS — E291 Testicular hypofunction: Secondary | ICD-10-CM

## 2014-01-05 DIAGNOSIS — E349 Endocrine disorder, unspecified: Secondary | ICD-10-CM

## 2014-01-05 NOTE — Patient Instructions (Signed)

## 2014-01-05 NOTE — Progress Notes (Signed)
Patient ID: Johnathan Strong, male   DOB: Jul 18, 1923, 80 y.o.   MRN: [id number] Pt given testosterone injection IM. Pt tolerated well

## 2014-01-07 ENCOUNTER — Other Ambulatory Visit: Payer: Self-pay

## 2014-01-07 MED ORDER — TIOTROPIUM BROMIDE MONOHYDRATE 18 MCG IN CAPS: ORAL_CAPSULE | RESPIRATORY_TRACT | Status: DC

## 2014-01-20 ENCOUNTER — Ambulatory Visit (INDEPENDENT_AMBULATORY_CARE_PROVIDER_SITE_OTHER): Payer: 59 | Admitting: Cardiology

## 2014-01-20 ENCOUNTER — Encounter: Payer: Self-pay | Admitting: Cardiology

## 2014-01-20 VITALS — BP 144/58 | HR 59 | Ht 68.0 in | Wt 163.1 lb

## 2014-01-20 DIAGNOSIS — E785 Hyperlipidemia, unspecified: Secondary | ICD-10-CM

## 2014-01-20 DIAGNOSIS — I119 Hypertensive heart disease without heart failure: Secondary | ICD-10-CM

## 2014-01-20 DIAGNOSIS — I259 Chronic ischemic heart disease, unspecified: Secondary | ICD-10-CM

## 2014-01-20 DIAGNOSIS — I25118 Atherosclerotic heart disease of native coronary artery with other forms of angina pectoris: Secondary | ICD-10-CM

## 2014-01-20 NOTE — Assessment & Plan Note (Signed)
The patient has a history of dyslipidemia and is on Lipitor.  His lipids are followed by his PCP Dr. Laurance Flatten

## 2014-01-20 NOTE — Assessment & Plan Note (Signed)
His blood pressure is remaining stable on current therapy.  He is not having any symptoms of heart failure.  No dizziness or syncope.

## 2014-01-20 NOTE — Progress Notes (Signed)
Johnathan Strong Date of Birth:  [birth year] [place] [address] [place], [place]  [address] [phone]        Fax   [phone]   History of Present Illness: Johnathan Strong is seen today for followup office visit.  He presented in April 2014 with symptoms of dyspnea on exertion. Subsequent Myoview study showed significant inferior wall ischemia. He underwent cardiac catheterization on 08/27/2012. This demonstrated ostial occlusion of the right coronary with excellent left to right collateral flow. He had a high-grade proximal LAD stenosis. LV function was well-preserved. He is seen to discuss treatment options today. Patient does have a history of COPD Gold stage III, CLL, TIA, and hypertension. He is aspirin allergic. He subsequently underwent successful DES stenting of his LAD on 09/26/12. He will be on lifelong Plavix. He is allergic to aspirin. 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 He had a P2Y12 assay prior to his stent placement which showed an excellent response of Plavix at 79 PR U The patient has a past history of dyslipidemia and has had good response to low-dose Lipitor 10 mg daily.  He also has a history of bladder cancer and is followed by Dr.Ottelin and a history of CLL for which he is followed by Dr. Broadus John 0. Moore at [facility].  Current Outpatient Prescriptions  Medication Sig Dispense Refill  . albuterol (VENTOLIN HFA) 108 (90 BASE) MCG/ACT inhaler Inhale 2 puffs into the lungs every 6 (six) hours as needed for wheezing.     . Azelastine HCl (ASTELIN NA) Place 1 spray into the nose as needed (for congestion).     [name] b complex vitamins tablet Take 3 tablets by mouth daily.    . carvedilol (COREG) 12.5 MG tablet TAKE ONE-HALF TABLET (6.25MG ) IN THE MORNING AND ONE TABLET(12.5) AT NIGHT BY MOUTH DAILY    . Cholecalciferol (VITAMIN D3) 1000 UNITS CAPS Take 1 capsule by mouth  daily.     . cimetidine (TAGAMET) 300 MG tablet TAKE 1 TABLET UP TO 4 TIMES A DAY 40 tablet 11  . clopidogrel (PLAVIX) 75 MG tablet TAKE 1 TABLET ONCE A DAY 90 tablet 0  . DEPO-TESTOSTERONE 200 MG/ML injection INJECT 1 ML INTRAMUSCULARLY MONTHLY 3 mL 1  . diphenhydramine-acetaminophen (TYLENOL PM) 25-500 MG TABS Take 1 tablet by mouth at bedtime as needed. sleep    . fluticasone (FLONASE) 50 MCG/ACT nasal spray 2 SPRAYS INTO EACH NOSTRIL ONCE A DAY 16 g 3  . furosemide (LASIX) 20 MG tablet Take 20 mg by mouth daily as needed for edema.     [name] LIPITOR 10 MG tablet TAKE 1 TABLET DAILY 90 tablet 1  . losartan (COZAAR) 50 MG tablet TAKE (1) TABLET TWICE A DAY. 180 tablet 1  . montelukast (SINGULAIR) 10 MG tablet TAKE ONE TABLET AT BEDTIME 90 tablet 1  . nitroGLYCERIN (NITROSTAT) 0.4 MG SL tablet Place 1 tablet (0.4 mg total) under the tongue every 5 (five) minutes as needed for chest pain. 25 tablet 3  . NORVASC 5 MG tablet TAKE UP TO 2 TABLETS ONCE DAILY AS DIRECTED 180 tablet 0  . Omega-3 Fatty Acids (FISH OIL) 1200 MG CAPS Take 1 capsule by mouth daily.     . SYMBICORT 160-4.5 MCG/ACT inhaler 2 PUFFS EVERY 12 HOURS 10.2 g 2  . tiotropium (SPIRIVA HANDIHALER) 18 MCG inhalation capsule INHALE THE CONTENTS OF ONE CAPSULE ONCE DAILY  AS DIRECTED 30 capsule 3  . traMADol (ULTRAM) 50 MG tablet TAKE 1 TABLET UP TO TWICE DAILY 60 tablet 0  . triamcinolone cream (KENALOG) 0.1 % APPLY TO AFFECTED AREAS 2 TIMES A DAY AS NEEDED 45 g 3  . VIBRAMYCIN 100 MG capsule TAKE AS DIRECTED. 50 capsule 0   Current Facility-Administered Medications  Medication Dose Route Frequency Provider Last Rate Last Dose  . testosterone cypionate (DEPOTESTOTERONE CYPIONATE) injection 200 mg  200 mg Intramuscular Q28 days Chipper Herb, MD   200 mg at 01/05/14 1606    Allergies  Allergen Reactions  . Aspirin Anaphylaxis, Shortness Of Breath and Swelling  . Nsaids Anaphylaxis, Shortness Of Breath and Swelling  . Septra  [Sulfamethoxazole-Trimethoprim] Rash  . Benicar [Olmesartan Medoxomil]     dizzy  . Salicylates     SOB  . Ampicillin Rash    Patient Active Problem List   Diagnosis Date Noted  . Testosterone deficiency 06/24/2013  . Personal history of bladder cancer 06/24/2013  . ASCVD (arteriosclerotic cardiovascular disease) 06/24/2013  . High risk medication use 02/20/2013  . Hyperlipemia 10/21/2012  . Unstable angina 09/27/2012  . CAD (coronary artery disease)   . DOE (dyspnea on exertion) 08/02/2012  . Dizziness 08/02/2012  . Hypertension 12/15/2010  . Chronic lymphocytic leukemia 12/15/2010  . COPD GOLD III 08/30/2010    History  Smoking status  . Former Smoker -- 1.00 packs/day for 25 years  . Types: Cigarettes  . Quit date: 03/06/1960  Smokeless tobacco  . Former Systems developer  . Quit date: 10/25/1978    History  Alcohol Use  . Yes    Comment: 09/26/2012 "glass of wine or mixed drink once/month"    Family History  Problem Relation Age of Onset  . Emphysema Father   . Heart disease Father   . COPD Father   . Atrial fibrillation Father   . Kidney cancer Mother   . Kidney failure Mother     Review of Systems: Constitutional: no fever chills diaphoresis or fatigue or change in weight.  Head and neck: no hearing loss, no epistaxis, no photophobia or visual disturbance. Respiratory: No cough, shortness of breath or wheezing. Cardiovascular: No chest pain peripheral edema, palpitations. Gastrointestinal: No abdominal distention, no abdominal pain, no change in bowel habits hematochezia or melena. Genitourinary: No dysuria, no frequency, no urgency, no nocturia. Musculoskeletal:No arthralgias, no back pain, no gait disturbance or myalgias. Neurological: No dizziness, no headaches, no numbness, no seizures, no syncope, no weakness, no tremors. Hematologic: No lymphadenopathy, no easy bruising. Psychiatric: No confusion, no hallucinations, no sleep disturbance.    Physical  Exam: Filed Vitals:   01/20/14 1542  BP: 144/58  Pulse: 59  The patient appears to be in no distress.  Head and neck exam reveals that the pupils are equal and reactive.  The extraocular movements are full.  There is no scleral icterus.  Mouth and pharynx are benign.  No lymphadenopathy.  No carotid bruits.  The jugular venous pressure is normal.  Thyroid is not enlarged or tender.  Chest is clear to percussion and auscultation.  No rales or rhonchi.  Expansion of the chest is symmetrical.  Heart reveals no abnormal lift or heave.  First and second heart sounds are normal.  There is no murmur gallop rub or click.  The abdomen is soft and nontender.  Bowel sounds are normoactive.  There is no hepatosplenomegaly or mass.  There are no abdominal bruits.  Extremities reveal no phlebitis or edema.  Pedal pulses are good.  There is no cyanosis or clubbing.  Neurologic exam is normal strength and no lateralizing weakness.  No sensory deficits.  Integument reveals no rash  EKG shows sinus bradycardia with first-degree AV block.  No ischemic changes.  Assessment / Plan: 1. Ischemic heart disease status post successful drug-eluting stent to high grade proximal LAD stenosis.  The patient has a known ostial occlusion of the right coronary artery with excellent left to right collateral flow.  He is allergic to aspirin.  He will be on lifelong Plavix. 2.  Hypercholesterolemia on low-dose Lipitor 3.  History of bladder cancer followed by Dr. Karsten Ro 4. History of CLL followed by Dr. Sabas Sous at [facility] 5. Essential hypertension without heart failure  Disposition: Continue current medication.  Recheck in 6 months for office visit

## 2014-01-20 NOTE — Patient Instructions (Signed)
Your physician recommends that you continue on your current medications as directed. Please refer to the Current Medication list given to you today.  Your physician wants you to follow-up in: 6 months with Dr. Mare Ferrari.  You will receive a reminder letter in the mail two months in advance. If you don't receive a letter, please call our office to schedule the follow-up appointment.

## 2014-01-20 NOTE — Assessment & Plan Note (Signed)
The patient has not had any recurrent chest tightness or angina pectoris.

## 2014-01-28 ENCOUNTER — Other Ambulatory Visit: Payer: Self-pay | Admitting: Family Medicine

## 2014-02-02 ENCOUNTER — Other Ambulatory Visit: Payer: Self-pay | Admitting: Family Medicine

## 2014-02-03 MED ORDER — TRAMADOL HCL 50 MG PO TABS: ORAL_TABLET | ORAL | Status: DC

## 2014-02-03 NOTE — Addendum Note (Signed)
Addended by: Zannie Cove on: 02/03/2014 12:22 PM   Modules accepted: Orders

## 2014-02-03 NOTE — Telephone Encounter (Signed)
All of these prescriptions okayed a refill 6 months

## 2014-02-03 NOTE — Telephone Encounter (Signed)
Tramadol was last filled 10/30/13,  also last seen on that day.

## 2014-02-05 ENCOUNTER — Ambulatory Visit (INDEPENDENT_AMBULATORY_CARE_PROVIDER_SITE_OTHER): Payer: Medicare Other | Admitting: *Deleted

## 2014-02-05 DIAGNOSIS — E349 Endocrine disorder, unspecified: Secondary | ICD-10-CM

## 2014-02-05 DIAGNOSIS — E291 Testicular hypofunction: Secondary | ICD-10-CM

## 2014-02-07 ENCOUNTER — Other Ambulatory Visit: Payer: Self-pay | Admitting: Family Medicine

## 2014-02-12 ENCOUNTER — Encounter (HOSPITAL_COMMUNITY): Payer: Self-pay | Admitting: Cardiology

## 2014-03-10 ENCOUNTER — Ambulatory Visit (INDEPENDENT_AMBULATORY_CARE_PROVIDER_SITE_OTHER): Payer: Medicare Other | Admitting: Family Medicine

## 2014-03-10 ENCOUNTER — Encounter: Payer: Self-pay | Admitting: Family Medicine

## 2014-03-10 ENCOUNTER — Ambulatory Visit (INDEPENDENT_AMBULATORY_CARE_PROVIDER_SITE_OTHER): Payer: Medicare Other

## 2014-03-10 VITALS — BP 121/53 | HR 57 | Temp 96.7°F | Ht 68.0 in | Wt 163.0 lb

## 2014-03-10 DIAGNOSIS — I2510 Atherosclerotic heart disease of native coronary artery without angina pectoris: Secondary | ICD-10-CM

## 2014-03-10 DIAGNOSIS — I119 Hypertensive heart disease without heart failure: Secondary | ICD-10-CM

## 2014-03-10 DIAGNOSIS — E349 Endocrine disorder, unspecified: Secondary | ICD-10-CM

## 2014-03-10 DIAGNOSIS — I700 Atherosclerosis of aorta: Secondary | ICD-10-CM

## 2014-03-10 DIAGNOSIS — Z79899 Other long term (current) drug therapy: Secondary | ICD-10-CM

## 2014-03-10 DIAGNOSIS — C679 Malignant neoplasm of bladder, unspecified: Secondary | ICD-10-CM

## 2014-03-10 DIAGNOSIS — E559 Vitamin D deficiency, unspecified: Secondary | ICD-10-CM

## 2014-03-10 DIAGNOSIS — J449 Chronic obstructive pulmonary disease, unspecified: Secondary | ICD-10-CM

## 2014-03-10 DIAGNOSIS — E785 Hyperlipidemia, unspecified: Secondary | ICD-10-CM

## 2014-03-10 DIAGNOSIS — E291 Testicular hypofunction: Secondary | ICD-10-CM

## 2014-03-10 NOTE — Progress Notes (Addendum)
Subjective:    Patient ID: Johnathan Strong, male    DOB: 05/28/1923, 81 y.o.   MRN: [id number]  HPI Pt here for follow up and management of chronic medical problems. This patient is doing well despite a history of multiple medical problems. He has a history of bladder cancer and is being followed by the urologist. He has a history of chronic lymphocytic leukemia and is being followed by an oncologist at [facility]. He also has ASCVD and is being followed by Dr. Mare Ferrari for this. He is periodically seen by the pulmonologist for his COPD. He has testosterone deficiency and will be getting his testosterone injection here. The patient does complain of some head congestion and drainage today. There are no other complaints. The patient is due for a rectal exam today. He will also get a chest x-ray today. He will return for his lab work in a couple weeks. He has an appointment for his eye examination tomorrow.         Patient Active Problem List   Diagnosis Date Noted  . Testosterone deficiency 06/24/2013  . Personal history of bladder cancer 06/24/2013  . ASCVD (arteriosclerotic cardiovascular disease) 06/24/2013  . High risk medication use 02/20/2013  . Hyperlipemia 10/21/2012  . Unstable angina 09/27/2012  . CAD (coronary artery disease)   . DOE (dyspnea on exertion) 08/02/2012  . Dizziness 08/02/2012  . Hypertension 12/15/2010  . Chronic lymphocytic leukemia 12/15/2010  . COPD GOLD III 08/30/2010   Outpatient Encounter Prescriptions as of 03/10/2014  Medication Sig  . albuterol (VENTOLIN HFA) 108 (90 BASE) MCG/ACT inhaler Inhale 2 puffs into the lungs every 6 (six) hours as needed for wheezing.   . Azelastine HCl (ASTELIN NA) Place 1 Strong into the nose as needed (for congestion).   . carvedilol (COREG) 12.5 MG tablet TAKE ONE-HALF TABLET (6.25MG) IN THE MORNING AND ONE TABLET(12.5) AT NIGHT BY MOUTH DAILY  . Cholecalciferol (VITAMIN D3) 1000 UNITS CAPS Take 1 capsule by mouth daily.   .  cimetidine (TAGAMET) 300 MG tablet TAKE 1 TABLET UP TO 4 TIMES A DAY  . clopidogrel (PLAVIX) 75 MG tablet TAKE 1 TABLET ONCE A DAY  . DEPO-TESTOSTERONE 200 MG/ML injection INJECT 1 ML INTRAMUSCULARLY MONTHLY  . diphenhydramine-acetaminophen (TYLENOL PM) 25-500 MG TABS Take 1 tablet by mouth at bedtime as needed. sleep  . fluticasone (FLONASE) 50 MCG/ACT nasal Strong 2 SPRAYS INTO EACH NOSTRIL ONCE A DAY  . furosemide (LASIX) 20 MG tablet TAKE (1) TABLET DAILY AS DIRECTED.  [name] LIPITOR 10 MG tablet TAKE 1 TABLET DAILY  . losartan (COZAAR) 50 MG tablet TAKE (1) TABLET TWICE A DAY.  . montelukast (SINGULAIR) 10 MG tablet TAKE ONE TABLET AT BEDTIME  . NORVASC 5 MG tablet TAKE UP TO 2 TABLETS ONCE DAILY AS DIRECTED  . Omega-3 Fatty Acids (FISH OIL) 1200 MG CAPS Take 1 capsule by mouth daily.   . SYMBICORT 160-4.5 MCG/ACT inhaler 2 PUFFS EVERY 12 HOURS  . tiotropium (SPIRIVA HANDIHALER) 18 MCG inhalation capsule INHALE THE CONTENTS OF ONE CAPSULE ONCE DAILY AS DIRECTED  . traMADol (ULTRAM) 50 MG tablet TAKE 1 TABLET UP TO 2 TIMES A DAY  . triamcinolone cream (KENALOG) 0.1 % APPLY TO AFFECTED AREAS 2 TIMES A DAY AS NEEDED  . nitroGLYCERIN (NITROSTAT) 0.4 MG SL tablet Place 1 tablet (0.4 mg total) under the tongue every 5 (five) minutes as needed for chest pain. (Patient not taking: Reported on 03/10/2014)  . VIBRAMYCIN 100 MG capsule TAKE  AS DIRECTED. (Patient not taking: Reported on 03/10/2014)  . [DISCONTINUED] b complex vitamins tablet Take 3 tablets by mouth daily.    Review of Systems  Constitutional: Negative.   HENT: Positive for congestion and postnasal drip.   Eyes: Negative.   Respiratory: Negative.   Cardiovascular: Negative.   Gastrointestinal: Negative.   Endocrine: Negative.   Genitourinary: Negative.   Musculoskeletal: Negative.   Skin: Negative.   Allergic/Immunologic: Negative.   Neurological: Negative.   Hematological: Negative.   Psychiatric/Behavioral: Negative.          Objective:   Physical Exam  Constitutional: He is oriented to person, place, and time. He appears well-developed and well-nourished. No distress.  The patient is doing well for his age of 25. Extremely well. He looks much younger than his stated age. He is alert and asked much younger than his stated age.  HENT:  Head: Normocephalic and atraumatic.  Right Ear: External ear normal.  Left Ear: External ear normal.  Nose: Nose normal.  Mouth/Throat: Oropharynx is clear and moist. No oropharyngeal exudate.  Minimal nasal congestion and throat looks good.  Eyes: Conjunctivae and EOM are normal. Pupils are equal, round, and reactive to light. Right eye exhibits no discharge. Left eye exhibits no discharge. No scleral icterus.  Neck: Normal range of motion. Neck supple. No thyromegaly present.  There is no anterior cervical adenopathy or carotid bruits.  Cardiovascular: Normal rate, regular rhythm, normal heart sounds and intact distal pulses.  Exam reveals no friction rub.   No murmur heard. The heart has a normal rate and rhythm at 72/m  Pulmonary/Chest: Effort normal and breath sounds normal. No respiratory distress. He has no wheezes. He has no rales. He exhibits no tenderness.  There is no axillary adenopathy. Breath sounds are slightly diminished bilaterally because of his COPD. There are no wheezes or rhonchi with coughing.  Abdominal: Soft. Bowel sounds are normal. He exhibits no mass. There is no tenderness. There is no rebound and no guarding.  There is no inguinal adenopathy and there are no abnormal abdominal masses or organ enlargement.  Genitourinary: Rectum normal and penis normal.  The prostate is enlarged but soft and smooth. There are no lumps or masses. There are no rectal masses. The external genitalia are somewhat atrophied. There are no inguinal hernias.  Musculoskeletal: Normal range of motion. He exhibits no edema.  Lymphadenopathy:    He has no cervical adenopathy.   Neurological: He is alert and oriented to person, place, and time. He has normal reflexes. No cranial nerve deficit.  Skin: Skin is warm and dry. No rash noted. No erythema. No pallor.  Psychiatric: He has a normal mood and affect. His behavior is normal. Judgment and thought content normal.  Nursing note and vitals reviewed.  BP 121/53 mmHg  Pulse 57  Temp(Src) 96.7 F (35.9 C) (Oral)  Ht _0  (1.727 m)  Wt 163 lb (73.936 kg)  BMI 24.79 kg/m2  [facility] reading (PRIMARY) by  Dr.Sekai Gitlin-no active disease, thoracic aortic atherosclerosis                                       Assessment & Plan:  1. Testosterone deficiency - POCT CBC; Future - POCT urinalysis dipstick; Future - POCT UA - Microscopic Only; Future - Testosterone,Free and Total; Future  2. ASCVD (arteriosclerotic cardiovascular disease) - POCT CBC; Future - BMP8+EGFR; Future - Hepatic function  panel; Future - NMR, lipoprofile; Future - DG Chest 2 View; Future  3. Chronic obstructive pulmonary disease, unspecified COPD, unspecified chronic bronchitis type - POCT CBC; Future - DG Chest 2 View; Future  4. Hypertension - POCT CBC; Future - BMP8+EGFR; Future - Hepatic function panel; Future - DG Chest 2 View; Future  5. Hyperlipemia - POCT CBC; Future - NMR, lipoprofile; Future - DG Chest 2 View; Future  6. High risk medication use - POCT CBC; Future  7. Vitamin D deficiency - POCT CBC; Future - Vit D  25 hydroxy (rtn osteoporosis monitoring); Future  8. Malignant neoplasm of urinary bladder, unspecified site - POCT CBC; Future -Continue follow-up with urology  Patient Instructions                       Medicare Annual Wellness Visit  [facility] and the medical providers at [facility] strive to bring you the best medical care.  In doing so we not only want to address your current medical conditions and concerns but also to detect new conditions early and prevent illness, disease  and health-related problems.    Medicare offers a yearly Wellness Visit which allows our clinical staff to assess your need for preventative services including immunizations, lifestyle education, counseling to decrease risk of preventable diseases and screening for fall risk and other medical concerns.    This visit is provided free of charge (no copay) for all Medicare recipients. The clinical pharmacists at [facility] have begun to conduct these Wellness Visits which will also include a thorough review of all your medications.    As you primary medical provider recommend that you make an appointment for your Annual Wellness Visit if you have not done so already this year.  You may set up this appointment before you leave today or you may call back ([phone]) and schedule an appointment.  Please make sure when you call that you mention that you are scheduling your Annual Wellness Visit with the clinical pharmacist so that the appointment may be made for the proper length of time.     Continue current medications. Continue good therapeutic lifestyle changes which include good diet and exercise. Fall precautions discussed with patient. If an FOBT was given today- please return it to our front desk. If you are over 74 years old - you may need Prevnar 2 or the adult Pneumonia vaccine.  Flu Shots will be available at our office starting mid- September. Please call and schedule a FLU CLINIC APPOINTMENT.   This winter continue to drink plenty of fluids Use saline nose Strong Continue to take Mucinex maximum strength 1 twice daily for cough and congestion Use inhalers as needed Start antibiotic if congestion moves into the chest.   Arrie Senate MD

## 2014-03-10 NOTE — Patient Instructions (Addendum)
Medicare Annual Wellness Visit  [facility] and the medical providers at [facility] strive to bring you the best medical care.  In doing so we not only want to address your current medical conditions and concerns but also to detect new conditions early and prevent illness, disease and health-related problems.    Medicare offers a yearly Wellness Visit which allows our clinical staff to assess your need for preventative services including immunizations, lifestyle education, counseling to decrease risk of preventable diseases and screening for fall risk and other medical concerns.    This visit is provided free of charge (no copay) for all Medicare recipients. The clinical pharmacists at [facility] have begun to conduct these Wellness Visits which will also include a thorough review of all your medications.    As you primary medical provider recommend that you make an appointment for your Annual Wellness Visit if you have not done so already this year.  You may set up this appointment before you leave today or you may call back ([phone]) and schedule an appointment.  Please make sure when you call that you mention that you are scheduling your Annual Wellness Visit with the clinical pharmacist so that the appointment may be made for the proper length of time.     Continue current medications. Continue good therapeutic lifestyle changes which include good diet and exercise. Fall precautions discussed with patient. If an FOBT was given today- please return it to our front desk. If you are over 82 years old - you may need Prevnar 61 or the adult Pneumonia vaccine.  Flu Shots will be available at our office starting mid- September. Please call and schedule a FLU CLINIC APPOINTMENT.   This winter continue to drink plenty of fluids Use saline nose spray Continue to take Mucinex maximum strength 1 twice daily for cough and  congestion Use inhalers as needed Start antibiotic if congestion moves into the chest.

## 2014-03-12 ENCOUNTER — Other Ambulatory Visit: Payer: Self-pay | Admitting: Family Medicine

## 2014-03-12 ENCOUNTER — Other Ambulatory Visit: Payer: Self-pay | Admitting: Cardiology

## 2014-04-03 ENCOUNTER — Other Ambulatory Visit (INDEPENDENT_AMBULATORY_CARE_PROVIDER_SITE_OTHER): Payer: Medicare Other

## 2014-04-03 DIAGNOSIS — E559 Vitamin D deficiency, unspecified: Secondary | ICD-10-CM

## 2014-04-03 DIAGNOSIS — J449 Chronic obstructive pulmonary disease, unspecified: Secondary | ICD-10-CM

## 2014-04-03 DIAGNOSIS — I119 Hypertensive heart disease without heart failure: Secondary | ICD-10-CM

## 2014-04-03 DIAGNOSIS — Z79899 Other long term (current) drug therapy: Secondary | ICD-10-CM

## 2014-04-03 DIAGNOSIS — C679 Malignant neoplasm of bladder, unspecified: Secondary | ICD-10-CM

## 2014-04-03 DIAGNOSIS — E785 Hyperlipidemia, unspecified: Secondary | ICD-10-CM

## 2014-04-03 DIAGNOSIS — E291 Testicular hypofunction: Secondary | ICD-10-CM

## 2014-04-03 DIAGNOSIS — I700 Atherosclerosis of aorta: Secondary | ICD-10-CM

## 2014-04-03 LAB — POCT URINALYSIS DIPSTICK
Bilirubin, UA: NEGATIVE
Blood, UA: NEGATIVE
Glucose, UA: NEGATIVE
KETONES UA: NEGATIVE
Leukocytes, UA: NEGATIVE
Nitrite, UA: NEGATIVE
PH UA: 6.5
Protein, UA: NEGATIVE
SPEC GRAV UA: 1.01
UROBILINOGEN UA: NEGATIVE

## 2014-04-03 LAB — POCT UA - MICROSCOPIC ONLY
Bacteria, U Microscopic: NEGATIVE
CRYSTALS, UR, HPF, POC: NEGATIVE
Casts, Ur, LPF, POC: NEGATIVE
Mucus, UA: NEGATIVE
RBC, urine, microscopic: NEGATIVE
WBC, Ur, HPF, POC: NEGATIVE
Yeast, UA: NEGATIVE

## 2014-04-03 LAB — POCT CBC
GRANULOCYTE PERCENT: 33.6 % — AB (ref 37–80)
HCT, POC: 40 % — AB (ref 43.5–53.7)
HEMOGLOBIN: 12 g/dL — AB (ref 14.1–18.1)
LYMPH, POC: 5.8 — AB (ref 0.6–3.4)
MCH, POC: 28.6 pg (ref 27–31.2)
MCHC: 30 g/dL — AB (ref 31.8–35.4)
MCV: 95.4 fL (ref 80–97)
MPV: 8.6 fL (ref 0–99.8)
POC GRANULOCYTE: 3.2 (ref 2–6.9)
POC LYMPH %: 60.9 % — AB (ref 10–50)
Platelet Count, POC: 252 10*3/uL (ref 142–424)
RBC: 4.2 M/uL — AB (ref 4.69–6.13)
RDW, POC: 21.6 %
WBC: 9.5 10*3/uL (ref 4.6–10.2)

## 2014-04-03 NOTE — Progress Notes (Signed)
Lab only 

## 2014-04-04 LAB — BMP8+EGFR
BUN / CREAT RATIO: 14 (ref 10–22)
BUN: 16 mg/dL (ref 8–27)
CHLORIDE: 103 mmol/L (ref 97–108)
CO2: 25 mmol/L (ref 18–29)
Calcium: 9.3 mg/dL (ref 8.6–10.2)
Creatinine, Ser: 1.15 mg/dL (ref 0.76–1.27)
GFR calc Af Amer: 65 mL/min/{1.73_m2} (ref >59)
GFR calc non Af Amer: 56 mL/min/{1.73_m2} — ABNORMAL LOW (ref >59)
Glucose: 94 mg/dL (ref 65–99)
Potassium: 4.5 mmol/L (ref 3.5–5.2)
Sodium: 144 mmol/L (ref 134–144)

## 2014-04-04 LAB — HEPATIC FUNCTION PANEL
ALT: 16 IU/L (ref 0–44)
AST: 19 IU/L (ref 0–40)
Albumin: 4.7 g/dL (ref 3.5–4.7)
Alkaline Phosphatase: 61 IU/L (ref 39–117)
Bilirubin, Direct: 0.27 mg/dL (ref 0.00–0.40)
Total Bilirubin: 1 mg/dL (ref 0.0–1.2)
Total Protein: 6.5 g/dL (ref 6.0–8.5)

## 2014-04-04 LAB — TESTOSTERONE,FREE AND TOTAL
Testosterone, Free: 2.6 pg/mL — ABNORMAL LOW (ref 6.6–18.1)
Testosterone: 225 ng/dL — ABNORMAL LOW (ref 348–1197)

## 2014-04-04 LAB — NMR, LIPOPROFILE
Cholesterol: 109 mg/dL (ref 100–199)
HDL Cholesterol by NMR: 44 mg/dL (ref >39)
HDL Particle Number: 29.1 umol/L — ABNORMAL LOW (ref >=30.5)
LDL Particle Number: 654 nmol/L (ref <1000)
LDL Size: 20.1 nm (ref >20.5)
LDL-C: 53 mg/dL (ref 0–99)
LP-IR Score: 39 (ref <=45)
SMALL LDL PARTICLE NUMBER: 479 nmol/L (ref <=527)
Triglycerides by NMR: 61 mg/dL (ref 0–149)

## 2014-04-04 LAB — VITAMIN D 25 HYDROXY (VIT D DEFICIENCY, FRACTURES): Vit D, 25-Hydroxy: 35 ng/mL (ref 30.0–100.0)

## 2014-04-10 ENCOUNTER — Other Ambulatory Visit: Payer: Self-pay | Admitting: Family Medicine

## 2014-04-13 ENCOUNTER — Ambulatory Visit (INDEPENDENT_AMBULATORY_CARE_PROVIDER_SITE_OTHER): Payer: Medicare Other | Admitting: *Deleted

## 2014-04-13 DIAGNOSIS — E291 Testicular hypofunction: Secondary | ICD-10-CM

## 2014-04-13 DIAGNOSIS — E349 Endocrine disorder, unspecified: Secondary | ICD-10-CM

## 2014-04-13 NOTE — Progress Notes (Signed)
Patient tolerated well. Patient to return in 2 weeks for testosterone level. Appt scheduled and order placed.

## 2014-04-13 NOTE — Telephone Encounter (Signed)
Last seen 03/10/14 [facility]   If approved print and route to nurse

## 2014-04-13 NOTE — Telephone Encounter (Signed)
The Lipitor is okay 6 months and the tramadol may be refilled 1

## 2014-04-21 ENCOUNTER — Ambulatory Visit (INDEPENDENT_AMBULATORY_CARE_PROVIDER_SITE_OTHER): Payer: Medicare Other

## 2014-04-21 ENCOUNTER — Inpatient Hospital Stay (HOSPITAL_COMMUNITY): Payer: Medicare Other

## 2014-04-21 ENCOUNTER — Telehealth: Payer: Self-pay | Admitting: Family Medicine

## 2014-04-21 ENCOUNTER — Ambulatory Visit (INDEPENDENT_AMBULATORY_CARE_PROVIDER_SITE_OTHER): Payer: Medicare Other | Admitting: Family Medicine

## 2014-04-21 ENCOUNTER — Encounter: Payer: Self-pay | Admitting: Family Medicine

## 2014-04-21 ENCOUNTER — Other Ambulatory Visit: Payer: Self-pay

## 2014-04-21 ENCOUNTER — Encounter (HOSPITAL_COMMUNITY): Payer: Self-pay

## 2014-04-21 ENCOUNTER — Inpatient Hospital Stay (HOSPITAL_COMMUNITY)
Admission: AD | Admit: 2014-04-21 | Discharge: 2014-04-27 | DRG: 190 | Disposition: A | Discharge status: Home / Self Care | Payer: Medicare Other | Source: Ambulatory Visit | Attending: Internal Medicine | Admitting: Internal Medicine

## 2014-04-21 VITALS — BP 128/57 | HR 68 | Temp 97.0°F | Ht 68.0 in | Wt 165.0 lb

## 2014-04-21 DIAGNOSIS — Z955 Presence of coronary angioplasty implant and graft: Secondary | ICD-10-CM

## 2014-04-21 DIAGNOSIS — C9110 Chronic lymphocytic leukemia of B-cell type not having achieved remission: Secondary | ICD-10-CM | POA: Diagnosis present

## 2014-04-21 DIAGNOSIS — J209 Acute bronchitis, unspecified: Secondary | ICD-10-CM | POA: Diagnosis present

## 2014-04-21 DIAGNOSIS — R05 Cough: Secondary | ICD-10-CM

## 2014-04-21 DIAGNOSIS — Z87891 Personal history of nicotine dependence: Secondary | ICD-10-CM | POA: Diagnosis not present

## 2014-04-21 DIAGNOSIS — I2510 Atherosclerotic heart disease of native coronary artery without angina pectoris: Secondary | ICD-10-CM | POA: Diagnosis present

## 2014-04-21 DIAGNOSIS — R0602 Shortness of breath: Secondary | ICD-10-CM

## 2014-04-21 DIAGNOSIS — Z8551 Personal history of malignant neoplasm of bladder: Secondary | ICD-10-CM | POA: Diagnosis not present

## 2014-04-21 DIAGNOSIS — R0902 Hypoxemia: Secondary | ICD-10-CM | POA: Diagnosis not present

## 2014-04-21 DIAGNOSIS — Z8249 Family history of ischemic heart disease and other diseases of the circulatory system: Secondary | ICD-10-CM

## 2014-04-21 DIAGNOSIS — J449 Chronic obstructive pulmonary disease, unspecified: Secondary | ICD-10-CM

## 2014-04-21 DIAGNOSIS — J9601 Acute respiratory failure with hypoxia: Secondary | ICD-10-CM | POA: Diagnosis present

## 2014-04-21 DIAGNOSIS — J441 Chronic obstructive pulmonary disease with (acute) exacerbation: Secondary | ICD-10-CM | POA: Diagnosis present

## 2014-04-21 DIAGNOSIS — I248 Other forms of acute ischemic heart disease: Secondary | ICD-10-CM | POA: Diagnosis present

## 2014-04-21 DIAGNOSIS — Z825 Family history of asthma and other chronic lower respiratory diseases: Secondary | ICD-10-CM

## 2014-04-21 DIAGNOSIS — J189 Pneumonia, unspecified organism: Secondary | ICD-10-CM

## 2014-04-21 DIAGNOSIS — Z7902 Long term (current) use of antithrombotics/antiplatelets: Secondary | ICD-10-CM | POA: Diagnosis not present

## 2014-04-21 DIAGNOSIS — R000 Tachycardia, unspecified: Secondary | ICD-10-CM | POA: Diagnosis present

## 2014-04-21 DIAGNOSIS — I4891 Unspecified atrial fibrillation: Secondary | ICD-10-CM | POA: Diagnosis present

## 2014-04-21 DIAGNOSIS — I480 Paroxysmal atrial fibrillation: Secondary | ICD-10-CM | POA: Diagnosis present

## 2014-04-21 DIAGNOSIS — T380X5A Adverse effect of glucocorticoids and synthetic analogues, initial encounter: Secondary | ICD-10-CM | POA: Diagnosis present

## 2014-04-21 DIAGNOSIS — K5900 Constipation, unspecified: Secondary | ICD-10-CM | POA: Diagnosis not present

## 2014-04-21 DIAGNOSIS — Z79899 Other long term (current) drug therapy: Secondary | ICD-10-CM

## 2014-04-21 DIAGNOSIS — J410 Simple chronic bronchitis: Secondary | ICD-10-CM | POA: Diagnosis not present

## 2014-04-21 DIAGNOSIS — R059 Cough, unspecified: Secondary | ICD-10-CM

## 2014-04-21 DIAGNOSIS — IMO0001 Reserved for inherently not codable concepts without codable children: Secondary | ICD-10-CM | POA: Diagnosis present

## 2014-04-21 DIAGNOSIS — I5030 Unspecified diastolic (congestive) heart failure: Secondary | ICD-10-CM | POA: Diagnosis present

## 2014-04-21 DIAGNOSIS — M8580 Other specified disorders of bone density and structure, unspecified site: Secondary | ICD-10-CM | POA: Diagnosis present

## 2014-04-21 DIAGNOSIS — I10 Essential (primary) hypertension: Secondary | ICD-10-CM | POA: Diagnosis present

## 2014-04-21 DIAGNOSIS — I2583 Coronary atherosclerosis due to lipid rich plaque: Secondary | ICD-10-CM

## 2014-04-21 DIAGNOSIS — I119 Hypertensive heart disease without heart failure: Secondary | ICD-10-CM | POA: Diagnosis not present

## 2014-04-21 DIAGNOSIS — Z886 Allergy status to analgesic agent status: Secondary | ICD-10-CM

## 2014-04-21 DIAGNOSIS — R0609 Other forms of dyspnea: Secondary | ICD-10-CM

## 2014-04-21 DIAGNOSIS — E785 Hyperlipidemia, unspecified: Secondary | ICD-10-CM | POA: Diagnosis present

## 2014-04-21 DIAGNOSIS — Z8673 Personal history of transient ischemic attack (TIA), and cerebral infarction without residual deficits: Secondary | ICD-10-CM | POA: Diagnosis not present

## 2014-04-21 LAB — COMPREHENSIVE METABOLIC PANEL
ALT: 18 U/L (ref 0–53)
AST: 24 U/L (ref 0–37)
Albumin: 4.5 g/dL (ref 3.5–5.2)
Alkaline Phosphatase: 55 U/L (ref 39–117)
Anion gap: 10 (ref 5–15)
BUN: 14 mg/dL (ref 6–23)
CALCIUM: 9 mg/dL (ref 8.4–10.5)
CO2: 27 mmol/L (ref 19–32)
CREATININE: 0.96 mg/dL (ref 0.50–1.35)
Chloride: 103 mmol/L (ref 96–112)
GFR calc Af Amer: 83 mL/min — ABNORMAL LOW (ref >90)
GFR calc non Af Amer: 71 mL/min — ABNORMAL LOW (ref >90)
GLUCOSE: 110 mg/dL — AB (ref 70–99)
Potassium: 4.6 mmol/L (ref 3.5–5.1)
SODIUM: 140 mmol/L (ref 135–145)
TOTAL PROTEIN: 6.6 g/dL (ref 6.0–8.3)
Total Bilirubin: 1.1 mg/dL (ref 0.3–1.2)

## 2014-04-21 LAB — CBC WITH DIFFERENTIAL/PLATELET
BASOS ABS: 0 10*3/uL (ref 0.0–0.1)
Basophils Relative: 0 % (ref 0–1)
EOS ABS: 0.1 10*3/uL (ref 0.0–0.7)
Eosinophils Relative: 1 % (ref 0–5)
HCT: 32.8 % — ABNORMAL LOW (ref 39.0–52.0)
Hemoglobin: 10.7 g/dL — ABNORMAL LOW (ref 13.0–17.0)
LYMPHS ABS: 4.7 10*3/uL — AB (ref 0.7–4.0)
Lymphocytes Relative: 42 % (ref 12–46)
MCH: 31.7 pg (ref 26.0–34.0)
MCHC: 32.6 g/dL (ref 30.0–36.0)
MCV: 97 fL (ref 78.0–100.0)
MONO ABS: 0.9 10*3/uL (ref 0.1–1.0)
Monocytes Relative: 8 % (ref 3–12)
Neutro Abs: 5.5 10*3/uL (ref 1.7–7.7)
Neutrophils Relative %: 49 % (ref 43–77)
PLATELETS: 140 10*3/uL — AB (ref 150–400)
RBC: 3.38 MIL/uL — ABNORMAL LOW (ref 4.22–5.81)
RDW: 20.3 % — ABNORMAL HIGH (ref 11.5–15.5)
WBC: 11.2 10*3/uL — AB (ref 4.0–10.5)

## 2014-04-21 LAB — BRAIN NATRIURETIC PEPTIDE: B Natriuretic Peptide: 484 pg/mL — ABNORMAL HIGH (ref 0.0–100.0)

## 2014-04-21 LAB — TROPONIN I: TROPONIN I: 0.79 ng/mL — AB (ref <0.031)

## 2014-04-21 LAB — MAGNESIUM: MAGNESIUM: 1.9 mg/dL (ref 1.5–2.5)

## 2014-04-21 MED ORDER — LEVOFLOXACIN IN D5W 500 MG/100ML IV SOLN
500.0000 mg | INTRAVENOUS | Status: DC
  Administered 2014-04-21 – 2014-04-23 (×3): 500 mg via INTRAVENOUS
  Filled 2014-04-21 (×3): qty 100

## 2014-04-21 MED ORDER — CLOPIDOGREL BISULFATE 75 MG PO TABS
75.0000 mg | ORAL_TABLET | Freq: Every day | ORAL | Status: DC
Start: 2014-04-22 — End: 2014-04-27
  Administered 2014-04-22 – 2014-04-27 (×6): 75 mg via ORAL
  Filled 2014-04-21 (×7): qty 1

## 2014-04-21 MED ORDER — ATORVASTATIN CALCIUM 10 MG PO TABS
10.0000 mg | ORAL_TABLET | Freq: Every day | ORAL | Status: DC
  Administered 2014-04-21 – 2014-04-26 (×6): 10 mg via ORAL
  Filled 2014-04-21 (×7): qty 1

## 2014-04-21 MED ORDER — MONTELUKAST SODIUM 10 MG PO TABS
10.0000 mg | ORAL_TABLET | Freq: Every day | ORAL | Status: DC
  Administered 2014-04-21 – 2014-04-26 (×6): 10 mg via ORAL
  Filled 2014-04-21 (×7): qty 1

## 2014-04-21 MED ORDER — FLUTICASONE PROPIONATE 50 MCG/ACT NA SUSP
2.0000 | Freq: Every day | NASAL | Status: DC
  Administered 2014-04-21 – 2014-04-23 (×3): 2 via NASAL
  Filled 2014-04-21: qty 16

## 2014-04-21 MED ORDER — AMLODIPINE BESYLATE 5 MG PO TABS
5.0000 mg | ORAL_TABLET | Freq: Every day | ORAL | Status: DC
  Administered 2014-04-22 – 2014-04-26 (×5): 5 mg via ORAL
  Filled 2014-04-21 (×6): qty 1

## 2014-04-21 MED ORDER — HEPARIN SODIUM (PORCINE) 5000 UNIT/ML IJ SOLN
5000.0000 [IU] | Freq: Three times a day (TID) | INTRAMUSCULAR | Status: DC
  Administered 2014-04-21 – 2014-04-23 (×5): 5000 [IU] via SUBCUTANEOUS
  Filled 2014-04-21 (×8): qty 1

## 2014-04-21 MED ORDER — BUDESONIDE-FORMOTEROL FUMARATE 160-4.5 MCG/ACT IN AERO
2.0000 | INHALATION_SPRAY | Freq: Two times a day (BID) | RESPIRATORY_TRACT | Status: DC
  Administered 2014-04-21 – 2014-04-27 (×12): 2 via RESPIRATORY_TRACT
  Filled 2014-04-21: qty 6

## 2014-04-21 MED ORDER — LOSARTAN POTASSIUM 50 MG PO TABS
50.0000 mg | ORAL_TABLET | Freq: Two times a day (BID) | ORAL | Status: DC
  Administered 2014-04-21 – 2014-04-27 (×12): 50 mg via ORAL
  Filled 2014-04-21 (×14): qty 1

## 2014-04-21 MED ORDER — TRAMADOL HCL 50 MG PO TABS
50.0000 mg | ORAL_TABLET | Freq: Two times a day (BID) | ORAL | Status: DC | PRN
  Administered 2014-04-22: 50 mg via ORAL
  Administered 2014-04-22: 25 mg via ORAL
  Administered 2014-04-23 – 2014-04-25 (×2): 50 mg via ORAL
  Filled 2014-04-21 (×5): qty 1

## 2014-04-21 MED ORDER — IPRATROPIUM-ALBUTEROL 0.5-2.5 (3) MG/3ML IN SOLN
3.0000 mL | Freq: Four times a day (QID) | RESPIRATORY_TRACT | Status: DC
  Administered 2014-04-21 – 2014-04-22 (×4): 3 mL via RESPIRATORY_TRACT
  Filled 2014-04-21 (×4): qty 3

## 2014-04-21 MED ORDER — FUROSEMIDE 20 MG PO TABS
20.0000 mg | ORAL_TABLET | Freq: Every day | ORAL | Status: DC
  Administered 2014-04-22 – 2014-04-23 (×2): 20 mg via ORAL
  Filled 2014-04-21 (×2): qty 1

## 2014-04-21 MED ORDER — ZOLPIDEM TARTRATE 5 MG PO TABS
5.0000 mg | ORAL_TABLET | Freq: Every evening | ORAL | Status: DC | PRN
  Administered 2014-04-21 – 2014-04-26 (×5): 5 mg via ORAL
  Filled 2014-04-21 (×5): qty 1

## 2014-04-21 MED ORDER — DOXYCYCLINE HYCLATE 100 MG PO CAPS: 100.0000 mg | ORAL_CAPSULE | Freq: Two times a day (BID) | ORAL | Status: DC

## 2014-04-21 MED ORDER — DIPHENHYDRAMINE HCL 25 MG PO CAPS: 25.0000 mg | ORAL_CAPSULE | Freq: Every evening | ORAL | Status: DC | PRN

## 2014-04-21 MED ORDER — CARVEDILOL 6.25 MG PO TABS
6.2500 mg | ORAL_TABLET | Freq: Two times a day (BID) | ORAL | Status: DC
  Administered 2014-04-21 – 2014-04-23 (×4): 6.25 mg via ORAL
  Filled 2014-04-21 (×6): qty 1

## 2014-04-21 NOTE — Progress Notes (Signed)
Subjective:    Patient ID: Johnathan Strong, male    DOB: Jul 24, 1923, 81 y.o.   MRN: [id number]  HPI Patient here today for cough, chest congestion and SOB that started on Sunday. He is accompanied today by his wife. He has been using his Symbicort and Spiriva and also taking some doxycycline 50 mg twice daily. He's only been doing the doxycycline that he had at home for couple days.   Patient Active Problem List   Diagnosis Date Noted  . Bladder cancer 03/10/2014  . Testosterone deficiency 06/24/2013  . ASCVD (arteriosclerotic cardiovascular disease) 06/24/2013  . High risk medication use 02/20/2013  . Hyperlipemia 10/21/2012  . Unstable angina 09/27/2012  . CAD (coronary artery disease)   . DOE (dyspnea on exertion) 08/02/2012  . Dizziness 08/02/2012  . Hypertension 12/15/2010  . Chronic lymphocytic leukemia 12/15/2010  . COPD GOLD III 08/30/2010   Outpatient Encounter Prescriptions as of 04/21/2014  Medication Sig  . albuterol (VENTOLIN HFA) 108 (90 BASE) MCG/ACT inhaler Inhale 2 puffs into the lungs every 6 (six) hours as needed for wheezing.   . Azelastine HCl (ASTELIN NA) Place 1 spray into the nose as needed (for congestion).   . carvedilol (COREG) 12.5 MG tablet TAKE ONE-HALF TABLET (6.25MG ) IN THE MORNING AND ONE TABLET(12.5) AT NIGHT BY MOUTH DAILY  . Cholecalciferol (VITAMIN D3) 1000 UNITS CAPS Take 1 capsule by mouth daily.   . cimetidine (TAGAMET) 300 MG tablet TAKE 1 TABLET UP TO 4 TIMES A DAY  . clopidogrel (PLAVIX) 75 MG tablet TAKE 1 TABLET ONCE A DAY  . DEPO-TESTOSTERONE 200 MG/ML injection INJECT 1 ML INTRAMUSCULARLY MONTHLY  . diphenhydramine-acetaminophen (TYLENOL PM) 25-500 MG TABS Take 1 tablet by mouth at bedtime as needed. sleep  . doxycycline (VIBRAMYCIN) 100 MG capsule Take 100 mg by mouth 2 (two) times daily. As directed  . fluticasone (FLONASE) 50 MCG/ACT nasal spray 2 SPRAYS INTO EACH NOSTRIL ONCE A DAY  . furosemide (LASIX) 20 MG tablet TAKE (1) TABLET  DAILY AS DIRECTED.  [name] LIPITOR 10 MG tablet TAKE 1 TABLET DAILY  . losartan (COZAAR) 50 MG tablet TAKE (1) TABLET TWICE A DAY.  . montelukast (SINGULAIR) 10 MG tablet TAKE ONE TABLET AT BEDTIME  . NORVASC 5 MG tablet TAKE UP TO 2 TABLETS ONCE DAILY AS DIRECTED  . Omega-3 Fatty Acids (FISH OIL) 1200 MG CAPS Take 1 capsule by mouth daily.   . SYMBICORT 160-4.5 MCG/ACT inhaler 2 PUFFS EVERY 12 HOURS  . tiotropium (SPIRIVA HANDIHALER) 18 MCG inhalation capsule INHALE THE CONTENTS OF ONE CAPSULE ONCE DAILY AS DIRECTED  . traMADol (ULTRAM) 50 MG tablet TAKE 1 TABLET UP TO 2 TIMES A DAY  . triamcinolone cream (KENALOG) 0.1 % APPLY TO AFFECTED AREAS 2 TIMES A DAY AS NEEDED  . [DISCONTINUED] VIBRAMYCIN 100 MG capsule TAKE AS DIRECTED.  [name] nitroGLYCERIN (NITROSTAT) 0.4 MG SL tablet Place 1 tablet (0.4 mg total) under the tongue every 5 (five) minutes as needed for chest pain. (Patient not taking: Reported on 04/21/2014)  . [DISCONTINUED] carvedilol (COREG) 12.5 MG tablet TAKE (1) TABLET TWICE A DAY.     Review of Systems  Constitutional: Negative.   HENT: Positive for congestion.   Eyes: Negative.   Respiratory: Positive for cough, shortness of breath and wheezing.   Cardiovascular: Negative.   Gastrointestinal: Negative.   Endocrine: Negative.   Genitourinary: Negative.   Musculoskeletal: Negative.   Skin: Negative.   Allergic/Immunologic: Negative.   Neurological: Negative.  Hematological: Negative.   Psychiatric/Behavioral: Negative.        Objective:   Physical Exam  Constitutional: He is oriented to person, place, and time. He appears well-developed and well-nourished. No distress.  HENT:  Head: Normocephalic and atraumatic.  Right Ear: External ear normal.  Left Ear: External ear normal.  Mouth/Throat: Oropharynx is clear and moist. No oropharyngeal exudate.  Nasal congestion bilaterally  Eyes: Conjunctivae and EOM are normal. Pupils are equal, round, and reactive to light. Right  eye exhibits no discharge. Left eye exhibits no discharge. No scleral icterus.  Neck: Normal range of motion. Neck supple. No thyromegaly present.  Cardiovascular: Normal rate, regular rhythm and normal heart sounds.   No murmur heard. Pulmonary/Chest: Effort normal. No respiratory distress. He has no wheezes. He has rales. He exhibits no tenderness.  Decreased breath sounds bilaterally with crackles in both lung bases  Abdominal: Bowel sounds are normal.  Musculoskeletal: Normal range of motion. He exhibits no edema.  Lymphadenopathy:    He has no cervical adenopathy.  Neurological: He is alert and oriented to person, place, and time.  Skin: Skin is warm and dry. No rash noted.  Psychiatric: He has a normal mood and affect. His behavior is normal. Judgment and thought content normal.  Nursing note and vitals reviewed.  BP 128/57 mmHg  Pulse 68  Temp(Src) 97 F (36.1 C) (Oral)  Ht 5\' 8"  (1.727 m)  Wt 165 lb (74.844 kg)  BMI 25.09 kg/m2  SpO2 83%  [facility] reading (PRIMARY) by  Dr. Madaline Savage bronchial congestion bilaterally                                  The pulse ox in the office ranged between 83% and 87%.      Assessment & Plan:  1. Cough - DG Chest 2 View; Future - CBC with Differential/Platelet  2. Bilateral pneumonia -Admit to hospital  3. Shortness of breath -Admit to hospital  4. Coronary artery disease due to lipid rich plaque -Continue follow-up with cardiology, Dr. Mare Ferrari  5. COPD with asthma -Admit to hospital  6. CLL (chronic lymphocytic leukemia) -Continue follow-up with hematologist at [facility]  Patient Instructions  Because of your low pulse oxygen level and shortness of breath and congestion in both lung bases we will recommend that you be admitted to the hospital for further treatment and evaluation    Admission for this patient is being arranged through [facility] long. I spoke with Dr. Rockne Menghini who indicated that she would be taking care of this  patient.  Arrie Senate MD

## 2014-04-21 NOTE — Telephone Encounter (Signed)
appt scheduled for 11:45 with Laurance Flatten

## 2014-04-21 NOTE — H&P (Signed)
History and Physical    Johnathan Strong:[id number] DOB: 31-May-1923 DOA: 04/21/2014  Referring physician: Dr. Laurance Flatten PCP: Redge Gainer, MD  Specialists: none   Chief Complaint: Shortness of breath  HPI: Johnathan Strong is a 81 y.o. male has a past medical history significant for coronary artery disease followed by Dr. Mare Ferrari, CLL, hypertension, hyperlipidemia, presents to the emergency room from [name] that since Sunday he felt like he was coming down with a cold, and this progressed yesterday with worsening dyspnea on exertion, and the cough has been more productive. He started taking doxycycline as well as his home inhalers however he did not feel any improvement, and he presented to Dr. Laurance Flatten which is his primary care doctor today, he was found to be hypoxic and thus referred for admission. Patient denies any chest pain, endorses mild chest discomfort when he is coughing, he denies any abdominal pain nausea or vomiting, he denies any lightheadedness or dizziness. He denies any diarrhea. He has no palpitations.  He denies any fevers. Per his wife, her latest January they had a trip in [place] where he had a "bad respiratory infection" and was on antibiotics.   Review of Systems:  as per history of present illness, otherwise negative   Past Medical History  Diagnosis Date  . Osteopenia 2006  . Hyperlipidemia   . BPH (benign prostatic hypertrophy)   . Transient global amnesia   . Osteoarthritis   . History of TIA (transient ischemic attack) 1980'S    NO RESIDUAL  . COPD (chronic obstructive pulmonary disease) with emphysema   . Chronic back pain   . Itching SECONDARY TO CLL-- CONTROLLED W/ SINGULAIR  . Frequency   . Nocturia   . Hypertension CARDIOLOGIST- DR BRACKBILL-- LAST VISIT 12-15-2010 NOTE IN EPIC  . CLL (chronic lymphocytic leukemia) LAST PLT COUNT APRL 2012  168    Managed at [facility]  . Bladder cancer   . Aspirin allergy     Eyes swell  . Asthma   . Dyspnea on exertion   . CAD (coronary artery disease)     a. 08/2012 Cath: LM nl, LAD 90p, LCX 20-30, RCA 100ost fills via L->R collats, EF 55-65%;  b. 09/2012 PCI of prox LAD with 3.0x16 Promus DES.   Past Surgical History  Procedure Laterality Date  . Excisional hemorrhoidectomy  1980  . Cardiovascular stress test  07/2005    a. 07/2005- no reversible ischemia, normal EF b. 06/2009- EF 73%, no reversible ischemia, inferolateral TWIs at rest, upright in recovery c. 08/2012- mediuim-sized partially reversible basal to mid inferior and inferoseptal perfusion defect c/w with prior infarction and peri-infarct ischemia; EF 68% and borderline ST/T changes on stress ECG   . Transurethral resection of bladder tumor  10-19-2008    AND DILATION URETHRAL STRICTURE  . Cataract extraction w/ intraocular lens  implant, bilateral Bilateral ~ 2007  . Transthoracic echocardiogram  01-03-2011    LVEF 56-31%, grade 1 diastolic dysfunction, no WMAs or structural abnormalities  . Cystoscopy with biopsy  10/30/2011    Procedure: CYSTOSCOPY WITH BIOPSY;  Surgeon: Claybon Jabs, MD;  Location: [facility];  Service: Urology;  Laterality: N/A;  . Cardiac catheterization  08/27/2012  . Coronary angioplasty with stent placement  09/26/2012    "1" (09/26/2012)  . Coronary stent placement  09/26/12  . Left heart catheterization with coronary  angiogram N/A 08/27/2012    Procedure: LEFT HEART CATHETERIZATION WITH CORONARY ANGIOGRAM;  Surgeon: Peter M Martinique, MD;  Location: [facility] CATH LAB;  Service: Cardiovascular;  Laterality: N/A;  . Percutaneous coronary stent intervention (pci-s) N/A 09/26/2012    Procedure: PERCUTANEOUS CORONARY STENT INTERVENTION (PCI-S);  Surgeon: Peter M Martinique, MD;  Location: [facility] CATH LAB;  Service: Cardiovascular;  Laterality: N/A;   Social History:  reports that he quit smoking about 54 years ago. His smoking  use included Cigarettes. He has a 25 pack-year smoking history. He quit smokeless tobacco use about 35 years ago. He reports that he drinks alcohol. He reports that he does not use illicit drugs.  Allergies  Allergen Reactions  . Aspirin Anaphylaxis, Shortness Of Breath and Swelling  . Nsaids Anaphylaxis, Shortness Of Breath and Swelling  . Septra [Sulfamethoxazole-Trimethoprim] Rash  . Benicar [Olmesartan Medoxomil]     dizzy  . Salicylates     SOB  . Ampicillin Rash    Family History  Problem Relation Age of Onset  . Emphysema Father   . Heart disease Father   . COPD Father   . Atrial fibrillation Father   . Kidney cancer Mother   . Kidney failure Mother     Prior to Admission medications   Medication Sig Start Date End Date Taking? Authorizing Provider  albuterol (VENTOLIN HFA) 108 (90 BASE) MCG/ACT inhaler Inhale 2 puffs into the lungs every 6 (six) hours as needed for wheezing.     Historical Provider, MD  Azelastine HCl (ASTELIN NA) Place 1 spray into the nose as needed (for congestion).     Historical Provider, MD  carvedilol (COREG) 12.5 MG tablet TAKE ONE-HALF TABLET (6.25MG ) IN THE MORNING AND ONE TABLET(12.5) AT NIGHT BY MOUTH DAILY 04/11/13   Chipper Herb, MD  Cholecalciferol (VITAMIN D3) 1000 UNITS CAPS Take 1 capsule by mouth daily.     Historical Provider, MD  cimetidine (TAGAMET) 300 MG tablet TAKE 1 TABLET UP TO 4 TIMES A DAY 01/02/13   Chipper Herb, MD  clopidogrel (PLAVIX) 75 MG tablet TAKE 1 TABLET ONCE A DAY 03/12/14   [name], MD  DEPO-TESTOSTERONE 200 MG/ML injection INJECT 1 ML INTRAMUSCULARLY MONTHLY 11/28/13   Chipper Herb, MD  diphenhydramine-acetaminophen (TYLENOL PM) 25-500 MG TABS Take 1 tablet by mouth at bedtime as needed. sleep    Historical Provider, MD  doxycycline (VIBRAMYCIN) 100 MG capsule Take 1 capsule (100 mg total) by mouth 2 (two) times daily. As directed 04/21/14   Chipper Herb, MD  fluticasone [facility]) 50 MCG/ACT nasal spray  2 SPRAYS INTO EACH NOSTRIL ONCE A DAY    Chipper Herb, MD  furosemide (LASIX) 20 MG tablet TAKE (1) TABLET DAILY AS DIRECTED. 02/03/14   Chipper Herb, MD  LIPITOR 10 MG tablet TAKE 1 TABLET DAILY 04/13/14   Chipper Herb, MD  losartan (COZAAR) 50 MG tablet TAKE (1) TABLET TWICE A DAY. 03/13/14   Chipper Herb, MD  montelukast (SINGULAIR) 10 MG tablet TAKE ONE TABLET AT BEDTIME 01/28/14   Chipper Herb, MD  nitroGLYCERIN (NITROSTAT) 0.4 MG SL tablet Place 1 tablet (0.4 mg total) under the tongue every 5 (five) minutes as needed for chest pain. Patient not taking: Reported on 04/21/2014 09/27/12   Rogelia Mire, NP  NORVASC 5 MG tablet TAKE UP TO 2 TABLETS ONCE DAILY AS DIRECTED 03/13/14   Chipper Herb, MD  Omega-3 Fatty Acids (FISH OIL) 1200 MG CAPS Take  1 capsule by mouth daily.     Historical Provider, MD  SYMBICORT 160-4.5 MCG/ACT inhaler 2 PUFFS EVERY 12 HOURS 02/09/14   Chipper Herb, MD  tiotropium [facility] HANDIHALER) 18 MCG inhalation capsule INHALE THE CONTENTS OF ONE CAPSULE ONCE DAILY AS DIRECTED 01/07/14   Chipper Herb, MD  traMADol Veat[facility]bon) 50 MG tablet TAKE 1 TABLET UP TO 2 TIMES A DAY 04/13/14   Chipper Herb, MD  triamcinolone cream (KENALOG) 0.1 % APPLY TO AFFECTED AREAS 2 TIMES A DAY AS NEEDED 01/02/13   Chipper Herb, MD   Physical Exam: Filed Vitals:   04/21/14 1434  BP: 158/55  Pulse: 77  Temp: 97.7 F (36.5 C)  TempSrc: Oral  Resp: 20  Height: 5\' 8"  (1.727 m)  Weight: 76.204 kg (168 lb)  SpO2: 92%     General:  No apparent distress, pleasant Caucasian male   Eyes: PERRL, no scleral icterus  ENT: moist oropharynx  Neck: supple, no lymphadenopathy  Cardiovascular: regular rate without MRG; 2+ peripheral pulses, no JVD, no peripheral edema  Respiratory: moves air well, bibasilar crackles heard, mild scattered wheezing   Abdomen: soft, non tender to palpation, positive bowel sounds, no guarding, no rebound  Skin: no rashes  Musculoskeletal: normal  bulk and tone, no joint swelling  Psychiatric: normal mood and affect  Neurologic: nonfocal   Labs on Admission:  Labs on admission pending   Radiological Exams on Admission: Dg Chest 2 View  04/21/2014   CLINICAL DATA:  Cough.  EXAM: CHEST  2 VIEW  COMPARISON:  03/10/2014.  02/26/2012.  FINDINGS: Mediastinum and hilar structures normal. Mild bibasilar pleural parenchymal thickening consistent with scarring. No focal infiltrate. Calcified pulmonary nodule left lung base, stable. No pleural effusion or pneumothorax. No acute bony abnormality.  IMPRESSION: No acute cardiopulmonary disease.   Electronically Signed   By: Marcello Moores  Register   On: 04/21/2014 12:47   EKG: pending  Assessment/Plan Active Problems:   COPD GOLD III   Hypertension   Chronic lymphocytic leukemia   DOE (dyspnea on exertion)   CAD (coronary artery disease)   Hyperlipemia   ASCVD (arteriosclerotic cardiovascular disease)   Hypoxia    Acute hypoxic respiratory failure -  based on clinical picture, it sounds patient has bronchitis versus early pneumonia. A cardiac component cannot be excluded given by basilar crackles. He is a component of COPD as I hear scattered minimal wheezing.   COPD - will provide Duonebs scheduled, continue Symbicort - Levofloxacin  Coronary artery disease - he is followed by Dr. Mare Ferrari, he had a cardiac catheterization in July 2014 and had a DES stenting of his LAD, on Plavix. - no chest pain - some DOE reported, will repeat 2D echo - check BNP - check troponin - continue Plavix  HTN - resume home medications,  Norvasc , Cozaar , Lasix , Coreg  HLD -  Resume Lipitor    Diet: heart healthy Fluids: none DVT Prophylaxis: heparin  Code Status: Full  Family Communication: d/w wife, daughter, niece bedside  Disposition Plan: inpatient  Time spent: 27  Jaamal Farooqui M. Cruzita Lederer, MD [facility] Pager [phone]  If 7PM-7AM, please contact  night-coverage [email or web address] Password [facility] 04/21/2014, 3:28 PM

## 2014-04-21 NOTE — Progress Notes (Signed)
CRITICAL VALUE ALERT  Critical value received:  Troponin 0.79  Date of notification:  04-21-14  Time of notification:  1700  Critical value read back: yes  Nurse who received alert:  Henrietta Dine  MD notified (1st page):  Dr. Cruzita Lederer  Time of first page:  1710  MD notified (2nd page):  Time of second page:  Responding MD:  Dr. Cruzita Lederer  Time MD responded:  [phone]

## 2014-04-21 NOTE — Patient Instructions (Signed)
Because of your low pulse oxygen level and shortness of breath and congestion in both lung bases we will recommend that you be admitted to the hospital for further treatment and evaluation

## 2014-04-22 DIAGNOSIS — J208 Acute bronchitis due to other specified organisms: Secondary | ICD-10-CM

## 2014-04-22 DIAGNOSIS — J410 Simple chronic bronchitis: Secondary | ICD-10-CM

## 2014-04-22 DIAGNOSIS — I119 Hypertensive heart disease without heart failure: Secondary | ICD-10-CM

## 2014-04-22 DIAGNOSIS — R0600 Dyspnea, unspecified: Secondary | ICD-10-CM

## 2014-04-22 LAB — BASIC METABOLIC PANEL
ANION GAP: 10 (ref 5–15)
BUN: 12 mg/dL (ref 6–23)
CHLORIDE: 101 mmol/L (ref 96–112)
CO2: 28 mmol/L (ref 19–32)
Calcium: 8.7 mg/dL (ref 8.4–10.5)
Creatinine, Ser: 0.97 mg/dL (ref 0.50–1.35)
GFR calc Af Amer: 82 mL/min — ABNORMAL LOW (ref >90)
GFR, EST NON AFRICAN AMERICAN: 71 mL/min — AB (ref >90)
Glucose, Bld: 101 mg/dL — ABNORMAL HIGH (ref 70–99)
POTASSIUM: 3.9 mmol/L (ref 3.5–5.1)
SODIUM: 139 mmol/L (ref 135–145)

## 2014-04-22 LAB — CBC
HCT: 31.4 % — ABNORMAL LOW (ref 39.0–52.0)
Hemoglobin: 10.4 g/dL — ABNORMAL LOW (ref 13.0–17.0)
MCH: 31.8 pg (ref 26.0–34.0)
MCHC: 33.1 g/dL (ref 30.0–36.0)
MCV: 96 fL (ref 78.0–100.0)
Platelets: 133 10*3/uL — ABNORMAL LOW (ref 150–400)
RBC: 3.27 MIL/uL — ABNORMAL LOW (ref 4.22–5.81)
RDW: 20.3 % — AB (ref 11.5–15.5)
WBC: 8.9 10*3/uL (ref 4.0–10.5)

## 2014-04-22 LAB — TROPONIN I
Troponin I: 0.79 ng/mL (ref <0.031)
Troponin I: 0.61 ng/mL (ref <0.031)

## 2014-04-22 MED ORDER — ACETAMINOPHEN 325 MG PO TABS
650.0000 mg | ORAL_TABLET | ORAL | Status: DC | PRN
  Administered 2014-04-23: 650 mg via ORAL
  Filled 2014-04-22 (×2): qty 2

## 2014-04-22 MED ORDER — ALBUTEROL SULFATE (2.5 MG/3ML) 0.083% IN NEBU
3.0000 mL | INHALATION_SOLUTION | RESPIRATORY_TRACT | Status: DC
  Administered 2014-04-22 – 2014-04-23 (×4): 3 mL via RESPIRATORY_TRACT
  Filled 2014-04-22 (×5): qty 3

## 2014-04-22 MED ORDER — TIOTROPIUM BROMIDE MONOHYDRATE 18 MCG IN CAPS
18.0000 ug | ORAL_CAPSULE | Freq: Every day | RESPIRATORY_TRACT | Status: DC
  Administered 2014-04-23 – 2014-04-27 (×5): 18 ug via RESPIRATORY_TRACT
  Filled 2014-04-22: qty 5

## 2014-04-22 MED ORDER — METHYLPREDNISOLONE SODIUM SUCC 125 MG IJ SOLR
60.0000 mg | Freq: Two times a day (BID) | INTRAMUSCULAR | Status: DC
  Administered 2014-04-22 (×2): 60 mg via INTRAVENOUS
  Filled 2014-04-22 (×4): qty 0.96

## 2014-04-22 MED ORDER — IPRATROPIUM-ALBUTEROL 0.5-2.5 (3) MG/3ML IN SOLN
3.0000 mL | RESPIRATORY_TRACT | Status: DC
Start: 2014-04-22 — End: 2014-04-22

## 2014-04-22 NOTE — Progress Notes (Signed)
[facility] PROGRESS NOTE  Assessment/Plan: Acute respiratory failure with hypoxia due to   COPD GOLD III and acute bronchitis - Patient is currently wheezing with poor air movement on physical exam. - He was started empirically on inhalers and antibiotics. I will go ahead and add IV steroids. - His leukocytosis has resolved. - Seems to have labored breathing.   Essential Hypertension: - Continue current home meds.  Chronic lymphocytic leukemia  ASCVD (arteriosclerotic cardiovascular disease)/  CAD (coronary artery disease): - Cardiac markers are trending down. He denies any chest pains. - 2-D echo is pending. - Continue Plavix.   Hyperlipemia  Hyperlipidemia: - Continue Lipitor.   Code Status: full Family Communication: Discussed with daughter at bedside.  Disposition Plan: Patient   Consultants:  None  Procedures:  Chest x-ray  Antibiotics:  Levaquin  HPI/Subjective: He relates his shortness of breath is unchanged.  Objective: Filed Vitals:   04/22/14 0206 04/22/14 0453 04/22/14 0624 04/22/14 0722  BP:   149/58   Pulse:   79   Temp:  97.9 F (36.6 C) 98.2 F (36.8 C)   TempSrc:  Oral Oral   Resp:      Height:      Weight:      SpO2: 97%  98% 98%   No intake or output data in the 24 hours ending 04/22/14 0847 Filed Weights   04/21/14 1434  Weight: 76.204 kg (168 lb)    Exam:  General: Alert, awake, oriented x3, in no acute distress.  HEENT: No bruits, no goiter.  Heart: Regular rate and rhythm, without murmurs, rubs, gallops.  Lungs: Moderate air movement with wheezing bilaterally using crackles on the left base. Abdomen: Soft, nontender, nondistended, positive bowel sounds.  Neuro: Grossly intact, nonfocal.   Data Reviewed: Basic Metabolic Panel:  Recent Labs Lab 04/21/14 1541 04/22/14 0515  NA 140 139  K 4.6 3.9  CL 103 101  CO2 27 28  GLUCOSE 110* 101*  BUN 14 12  CREATININE 0.96 0.97  CALCIUM 9.0 8.7  MG 1.9  --     Liver Function Tests:  Recent Labs Lab 04/21/14 1541  AST 24  ALT 18  ALKPHOS 55  BILITOT 1.1  PROT 6.6  ALBUMIN 4.5   No results for input(s): LIPASE, AMYLASE in the last 168 hours. No results for input(s): AMMONIA in the last 168 hours. CBC:  Recent Labs Lab 04/21/14 1541 04/22/14 0515  WBC 11.2* 8.9  NEUTROABS 5.5  --   HGB 10.7* 10.4*  HCT 32.8* 31.4*  MCV 97.0 96.0  PLT 140* 133*   Cardiac Enzymes:  Recent Labs Lab 04/21/14 1541 04/21/14 2237 04/22/14 0515  TROPONINI 0.79* 0.79* 0.61*   BNP (last 3 results)  Recent Labs  04/21/14 1541  BNP 484.0*    ProBNP (last 3 results) No results for input(s): PROBNP in the last 8760 hours.  CBG: No results for input(s): GLUCAP in the last 168 hours.  No results found for this or any previous visit (from the past 240 hour(s)).   Studies: X-ray Chest Pa And Lateral  04/21/2014   CLINICAL DATA:  Shortness of breath, hypoxia  EXAM: CHEST  2 VIEW  COMPARISON:  [facility] PA and lateral chest radiographs 04/21/2014 at 12:30 p.m., available on [facility] PACS  FINDINGS: The heart size and mediastinal contours are within normal limits. Both lungs are clear but hyperinflated suggesting emphysema. Left lower lobe calcified granuloma reidentified. The visualized skeletal structures are unremarkable.  IMPRESSION:  No significant change since exam performed earlier today at [facility]. Stable mild hyperinflation suggesting emphysema.   Electronically Signed   By: Conchita Paris M.D.   On: 04/21/2014 16:40   Dg Chest 2 View  04/21/2014   CLINICAL DATA:  Cough.  EXAM: CHEST  2 VIEW  COMPARISON:  03/10/2014.  02/26/2012.  FINDINGS: Mediastinum and hilar structures normal. Mild bibasilar pleural parenchymal thickening consistent with scarring. No focal infiltrate. Calcified pulmonary nodule left lung base, stable. No pleural effusion or pneumothorax. No acute bony abnormality.   IMPRESSION: No acute cardiopulmonary disease.   Electronically Signed   By: Marcello Moores  Register   On: 04/21/2014 12:47    Scheduled Meds: . amLODipine  5 mg Oral Daily  . atorvastatin  10 mg Oral q1800  . budesonide-formoterol  2 puff Inhalation BID  . carvedilol  6.25 mg Oral BID WC  . clopidogrel  75 mg Oral Daily  . fluticasone  2 spray Each Nare Daily  . furosemide  20 mg Oral Daily  . heparin  5,000 Units Subcutaneous 3 times per day  . ipratropium-albuterol  3 mL Nebulization Q6H  . levofloxacin (LEVAQUIN) IV  500 mg Intravenous Q24H  . losartan  50 mg Oral BID  . montelukast  10 mg Oral QHS   Continuous Infusions:    Charlynne Cousins  [facility] Pager [phone]. If 8PM-8AM, please contact night-coverage at [email or web address], password [email or web address] 04/22/2014, 8:47 AM  LOS: 1 day

## 2014-04-22 NOTE — Progress Notes (Signed)
CARE MANAGEMENT NOTE 04/22/2014  Patient:  Johnathan Strong, Johnathan Strong   Account Number:  [id number]  Date Initiated:  04/22/2014  Documentation initiated by:  Edwyna Shell  Subjective/Objective Assessment:   81 yo male admitted with hypoxia secondary to possible bronchitis from home     Action/Plan:   discharge planning   Anticipated DC Date:  04/24/2014   Anticipated DC Plan:        [place]  CM consult      Choice offered to / List presented to:             Status of service:  In process, will continue to follow Medicare Important Message given?   (If response is "NO", the following Medicare IM given date fields will be blank) Date Medicare IM given:   Medicare IM given by:   Date Additional Medicare IM given:   Additional Medicare IM given by:    Discharge Disposition:    Per UR Regulation:    If discussed at Long Length of Stay Meetings, dates discussed:    Comments:  04/22/14 Edwyna Shell RN BSN CM [phone] Patient lives at home with spouse and has a cane. He would like outpatient vs. HH PT if recommended by PT. Await PT recommendations. Patient stated that he has had outpatient rehab through [facility] in the past.

## 2014-04-22 NOTE — Progress Notes (Signed)
  Echocardiogram 2D Echocardiogram has been performed.  Johnathan Strong FRANCES 04/22/2014, 12:18 PM

## 2014-04-23 ENCOUNTER — Inpatient Hospital Stay (HOSPITAL_COMMUNITY): Payer: Medicare Other

## 2014-04-23 DIAGNOSIS — I471 Supraventricular tachycardia: Secondary | ICD-10-CM

## 2014-04-23 DIAGNOSIS — I480 Paroxysmal atrial fibrillation: Secondary | ICD-10-CM

## 2014-04-23 DIAGNOSIS — J9601 Acute respiratory failure with hypoxia: Secondary | ICD-10-CM

## 2014-04-23 DIAGNOSIS — J42 Unspecified chronic bronchitis: Secondary | ICD-10-CM

## 2014-04-23 DIAGNOSIS — I5031 Acute diastolic (congestive) heart failure: Secondary | ICD-10-CM

## 2014-04-23 DIAGNOSIS — I2510 Atherosclerotic heart disease of native coronary artery without angina pectoris: Secondary | ICD-10-CM

## 2014-04-23 DIAGNOSIS — I4891 Unspecified atrial fibrillation: Secondary | ICD-10-CM

## 2014-04-23 LAB — PROTIME-INR
INR: 1.17 (ref 0.00–1.49)
Prothrombin Time: 15.1 seconds (ref 11.6–15.2)

## 2014-04-23 LAB — APTT: aPTT: 30 seconds (ref 24–37)

## 2014-04-23 LAB — HEPARIN LEVEL (UNFRACTIONATED): HEPARIN UNFRACTIONATED: 0.4 [IU]/mL (ref 0.30–0.70)

## 2014-04-23 MED ORDER — METOPROLOL TARTRATE 1 MG/ML IV SOLN
5.0000 mg | Freq: Four times a day (QID) | INTRAVENOUS | Status: DC | PRN
  Administered 2014-04-23 – 2014-04-26 (×2): 5 mg via INTRAVENOUS
  Filled 2014-04-23 (×4): qty 5

## 2014-04-23 MED ORDER — METOPROLOL TARTRATE 1 MG/ML IV SOLN
2.5000 mg | Freq: Once | INTRAVENOUS | Status: AC
  Administered 2014-04-23: 2.5 mg via INTRAVENOUS
  Filled 2014-04-23: qty 5

## 2014-04-23 MED ORDER — LORAZEPAM 1 MG PO TABS
1.0000 mg | ORAL_TABLET | Freq: Once | ORAL | Status: DC
  Administered 2014-04-23: 1 mg via ORAL
  Filled 2014-04-23: qty 1

## 2014-04-23 MED ORDER — METHYLPREDNISOLONE SODIUM SUCC 125 MG IJ SOLR
60.0000 mg | Freq: Three times a day (TID) | INTRAMUSCULAR | Status: DC
  Administered 2014-04-23 – 2014-04-24 (×3): 60 mg via INTRAVENOUS
  Filled 2014-04-23 (×6): qty 0.96

## 2014-04-23 MED ORDER — LORAZEPAM 2 MG/ML IJ SOLN: 0.5000 mg | Freq: Once | INTRAMUSCULAR | Status: AC

## 2014-04-23 MED ORDER — FUROSEMIDE 40 MG PO TABS
40.0000 mg | ORAL_TABLET | Freq: Every day | ORAL | Status: DC
Start: 2014-04-24 — End: 2014-04-27
  Administered 2014-04-24 – 2014-04-27 (×4): 40 mg via ORAL
  Filled 2014-04-23 (×5): qty 1

## 2014-04-23 MED ORDER — CALCIUM CARBONATE ANTACID 500 MG PO CHEW
400.0000 mg | CHEWABLE_TABLET | Freq: Three times a day (TID) | ORAL | Status: DC | PRN
  Administered 2014-04-23 – 2014-04-26 (×4): 400 mg via ORAL
  Filled 2014-04-23 (×6): qty 1

## 2014-04-23 MED ORDER — METOPROLOL TARTRATE 25 MG PO TABS
25.0000 mg | ORAL_TABLET | Freq: Two times a day (BID) | ORAL | Status: DC
  Administered 2014-04-23 – 2014-04-27 (×9): 25 mg via ORAL
  Filled 2014-04-23 (×11): qty 1

## 2014-04-23 MED ORDER — METOPROLOL TARTRATE 1 MG/ML IV SOLN: 2.5000 mg | INTRAVENOUS | Status: DC

## 2014-04-23 MED ORDER — HEPARIN (PORCINE) IN NACL 100-0.45 UNIT/ML-% IJ SOLN
1100.0000 [IU]/h | INTRAMUSCULAR | Status: DC
  Administered 2014-04-23: 1200 [IU]/h via INTRAVENOUS
  Administered 2014-04-24 – 2014-04-25 (×2): 1100 [IU]/h via INTRAVENOUS
  Filled 2014-04-23 (×4): qty 250

## 2014-04-23 MED ORDER — GUAIFENESIN 100 MG/5ML PO SYRP
200.0000 mg | ORAL_SOLUTION | ORAL | Status: DC | PRN
  Filled 2014-04-23: qty 10

## 2014-04-23 MED ORDER — FUROSEMIDE 10 MG/ML IJ SOLN
40.0000 mg | Freq: Once | INTRAMUSCULAR | Status: AC
  Administered 2014-04-23: 40 mg via INTRAVENOUS
  Filled 2014-04-23: qty 4

## 2014-04-23 MED ORDER — LEVALBUTEROL HCL 0.63 MG/3ML IN NEBU
0.6300 mg | INHALATION_SOLUTION | Freq: Four times a day (QID) | RESPIRATORY_TRACT | Status: DC
  Administered 2014-04-23 – 2014-04-27 (×16): 0.63 mg via RESPIRATORY_TRACT
  Filled 2014-04-23 (×16): qty 3

## 2014-04-23 NOTE — Progress Notes (Signed)
Clinical Social Work Department BRIEF PSYCHOSOCIAL ASSESSMENT 04/23/2014  Patient:  Johnathan Strong, Johnathan Strong     Account Number:  [id number]     Admit date:  04/21/2014  Clinical Social Worker:  Earlie Server  Date/Time:  04/23/2014 01:45 PM  Referred by:  Physician  Date Referred:  04/23/2014 Referred for  SNF Placement   Other Referral:   Interview type:  Patient Other interview type:    PSYCHOSOCIAL DATA Living Status:  FAMILY Admitted from facility:   Level of care:   Primary support name:  [name] Primary support relationship to patient:  SPOUSE Degree of support available:   Strong    CURRENT CONCERNS Current Concerns  Post-Acute Placement   Other Concerns:    SOCIAL WORK ASSESSMENT / PLAN CSW received referral in order to assist with DC planning. Per chart review, PT recommending HH vs SNF. CSW met with patient, wife, and dtr at bedside. CSW introduced myself and explained role. Patient agreeable to family involvement during assessment.    Patient lives at home with wife. Patient reports that they have children but they do not live nearby. Patient and wife have been doing well at home but patient reports he got sick during the last snow storm. Patient was not using any equipment and reports he was very active and independent prior to admission. Patient and wife spoke about being involved with the [place] at the [facility] up until the past year. Patient is aware of PT recommendations but reports he only wants to return home at DC. CSW provided SNF list and explained SNF process but patient is not interested.    Patient's wife and dtr agreeable with patient's decision and patient plans to return home with family support. CSW is signing off but available if further needs arise.   Assessment/plan status:  No Further Intervention Required Other assessment/ plan:   Information/referral to community resources:   SNF information    PATIENT'S/FAMILY'S RESPONSE TO PLAN OF  CARE: Patient alert and oriented and engaged in assessment. Patient agreeable to talk about SNF options but reports he is strong and has taken good care of himself for his age. Patient spoke about how family members have lived long lives and it has always been important for him to manage his health. Patient states he appreciates information on SNF but that family will assist him at home as needed. Patient's wife and dtr at bedside and agreeable to plans.       [place], [place] [phone]

## 2014-04-23 NOTE — Progress Notes (Addendum)
[facility] PROGRESS NOTE  Assessment/Plan: Acute respiratory failure with hypoxia due to   COPD GOLD III and acute bronchitis - Started empirically on inhalers, antibiotics, I will increase steroids. - He is still wheezing and moving poor air. - Start Guaifenesin and check CXR for mucus plugging. - Change albuterol to Xopenex.   Essential Hypertension: - Continue current home meds.  Sinus tachycardia: - Most likely due to acute respiratory failure, it has been hard to control his tachycardia, 2-D echo showed no wall motion abnormalities. - Cardiac enzymes are trending down. He was complaining of some left arm pain and chest pain. - I will go ahead and consult cardiology. - He is on Coreg, will add metoprolol IV.  Chronic lymphocytic leukemia  ASCVD (arteriosclerotic cardiovascular disease)/  CAD (coronary artery disease): - Cardiac markers are trending down. He did complain of some chest pain today - 2-D echo grade 1 diastolic heart failure, with no wall motion abnormalities. - Continue Plavix.   Hyperlipemia  Hyperlipidemia: - Continue Lipitor.   Code Status: full Family Communication: Discussed with daughter at bedside.  Disposition Plan: Patient   Consultants:  None  Procedures:  Chest x-ray  Antibiotics:  Levaquin  HPI/Subjective: He relates his shortness of breath is unchanged, he had an episode of palpitations with some chest discomfort and left arm discomfort.  Objective: Filed Vitals:   04/23/14 0331 04/23/14 0632 04/23/14 0724 04/23/14 0750  BP:  156/70  144/74  Pulse:  132  133  Temp:  97.8 F (36.6 C)  97.2 F (36.2 C)  TempSrc:  Oral  Oral  Resp:  24  20  Height:      Weight:      SpO2: 94% 97% 95% 97%    Intake/Output Summary (Last 24 hours) at 04/23/14 0824 Last data filed at 04/22/14 1300  Gross per 24 hour  Intake    520 ml  Output      0 ml  Net    520 ml   Filed Weights   04/21/14 1434  Weight: 76.204 kg (168 lb)     Exam:  General: Alert, awake, oriented x3, in no acute distress.  HEENT: No bruits, no goiter.  Heart: Regular rate and rhythm. Lungs: He still moving poor air, with diffuse wheezing expiratory bilaterally. Abdomen: Soft, nontender, nondistended, positive bowel sounds.  Neuro: Grossly intact, nonfocal.   Data Reviewed: Basic Metabolic Panel:  Recent Labs Lab 04/21/14 1541 04/22/14 0515  NA 140 139  K 4.6 3.9  CL 103 101  CO2 27 28  GLUCOSE 110* 101*  BUN 14 12  CREATININE 0.96 0.97  CALCIUM 9.0 8.7  MG 1.9  --    Liver Function Tests:  Recent Labs Lab 04/21/14 1541  AST 24  ALT 18  ALKPHOS 55  BILITOT 1.1  PROT 6.6  ALBUMIN 4.5   No results for input(s): LIPASE, AMYLASE in the last 168 hours. No results for input(s): AMMONIA in the last 168 hours. CBC:  Recent Labs Lab 04/21/14 1541 04/22/14 0515  WBC 11.2* 8.9  NEUTROABS 5.5  --   HGB 10.7* 10.4*  HCT 32.8* 31.4*  MCV 97.0 96.0  PLT 140* 133*   Cardiac Enzymes:  Recent Labs Lab 04/21/14 1541 04/21/14 2237 04/22/14 0515  TROPONINI 0.79* 0.79* 0.61*   BNP (last 3 results)  Recent Labs  04/21/14 1541  BNP 484.0*    ProBNP (last 3 results) No results for input(s): PROBNP in the last 8760 hours.  CBG:  No results for input(s): GLUCAP in the last 168 hours.  No results found for this or any previous visit (from the past 240 hour(s)).   Studies: X-ray Chest Pa And Lateral  04/21/2014   CLINICAL DATA:  Shortness of breath, hypoxia  EXAM: CHEST  2 VIEW  COMPARISON:  [facility] PA and lateral chest radiographs 04/21/2014 at 12:30 p.m., available on [facility] PACS  FINDINGS: The heart size and mediastinal contours are within normal limits. Both lungs are clear but hyperinflated suggesting emphysema. Left lower lobe calcified granuloma reidentified. The visualized skeletal structures are unremarkable.  IMPRESSION: No significant change since exam performed earlier today  at [place]. Stable mild hyperinflation suggesting emphysema.   Electronically Signed   By: Conchita Paris M.D.   On: 04/21/2014 16:40   Dg Chest 2 View  04/21/2014   CLINICAL DATA:  Cough.  EXAM: CHEST  2 VIEW  COMPARISON:  03/10/2014.  02/26/2012.  FINDINGS: Mediastinum and hilar structures normal. Mild bibasilar pleural parenchymal thickening consistent with scarring. No focal infiltrate. Calcified pulmonary nodule left lung base, stable. No pleural effusion or pneumothorax. No acute bony abnormality.  IMPRESSION: No acute cardiopulmonary disease.   Electronically Signed   By: Marcello Moores  Register   On: 04/21/2014 12:47    Scheduled Meds: . albuterol  3 mL Inhalation Q4H  . amLODipine  5 mg Oral Daily  . atorvastatin  10 mg Oral q1800  . budesonide-formoterol  2 puff Inhalation BID  . carvedilol  6.25 mg Oral BID WC  . clopidogrel  75 mg Oral Daily  . fluticasone  2 spray Each Nare Daily  . furosemide  20 mg Oral Daily  . heparin  5,000 Units Subcutaneous 3 times per day  . levofloxacin (LEVAQUIN) IV  500 mg Intravenous Q24H  . losartan  50 mg Oral BID  . methylPREDNISolone (SOLU-MEDROL) injection  60 mg Intravenous Q12H  . montelukast  10 mg Oral QHS  . tiotropium  18 mcg Inhalation Daily   Continuous Infusions:    Charlynne Cousins  [facility] Pager [phone]. If 8PM-8AM, please contact night-coverage at [email or web address], password [facility] 04/23/2014, 8:24 AM  LOS: 2 days

## 2014-04-23 NOTE — Progress Notes (Signed)
ANTICOAGULATION CONSULT NOTE - Initial Consult  Pharmacy Consult for heparin Indication: atrial fibrillation  Allergies  Allergen Reactions  . Aspirin Anaphylaxis, Shortness Of Breath and Swelling  . Nsaids Anaphylaxis, Shortness Of Breath and Swelling  . Septra [Sulfamethoxazole-Trimethoprim] Rash  . Benicar [Olmesartan Medoxomil]     dizzy  . Salicylates     SOB  . Ampicillin Rash    Patient Measurements: Height: 5\' 8"  (172.7 cm) Weight: 168 lb (76.204 kg) IBW/kg (Calculated) : 68.4 Heparin Dosing Weight: 76.2  Vital Signs: Temp: 97.8 F (36.6 C) (02/18 1402) Temp Source: Oral (02/18 1402) BP: 146/58 mmHg (02/18 1402) Pulse Rate: 76 (02/18 1402)  Labs:  Recent Labs  04/21/14 1541 04/21/14 2237 04/22/14 0515  HGB 10.7*  --  10.4*  HCT 32.8*  --  31.4*  PLT 140*  --  133*  CREATININE 0.96  --  0.97  TROPONINI 0.79* 0.79* 0.61*    Estimated Creatinine Clearance: 49.9 mL/min (by C-G formula based on Cr of 0.97).   Medical History: Past Medical History  Diagnosis Date  . Osteopenia 2006  . Hyperlipidemia   . BPH (benign prostatic hypertrophy)   . Transient global amnesia   . Osteoarthritis   . History of TIA (transient ischemic attack) 1980'S    NO RESIDUAL  . COPD (chronic obstructive pulmonary disease) with emphysema   . Chronic back pain   . Itching SECONDARY TO CLL-- CONTROLLED W/ SINGULAIR  . Frequency   . Nocturia   . Hypertension CARDIOLOGIST- DR BRACKBILL-- LAST VISIT 12-15-2010 NOTE IN EPIC  . CLL (chronic lymphocytic leukemia) LAST PLT COUNT APRL 2012  168    Managed at [facility]  . Bladder cancer   . Aspirin allergy     Eyes swell  . Asthma   . Dyspnea on exertion   . CAD (coronary artery disease)     a. 08/2012 Cath: LM nl, LAD 90p, LCX 20-30, RCA 100ost fills via L->R collats, EF 55-65%;  b. 09/2012 PCI of prox LAD with 3.0x16 Promus DES.    Medications:  Scheduled:  . amLODipine  5 mg Oral Daily  . atorvastatin  10  mg Oral q1800  . budesonide-formoterol  2 puff Inhalation BID  . clopidogrel  75 mg Oral Daily  . [START ON 04/24/2014] furosemide  40 mg Oral Daily  . levalbuterol  0.63 mg Nebulization Q6H  . levofloxacin (LEVAQUIN) IV  500 mg Intravenous Q24H  . losartan  50 mg Oral BID  . methylPREDNISolone (SOLU-MEDROL) injection  60 mg Intravenous 3 times per day  . metoprolol tartrate  25 mg Oral BID  . montelukast  10 mg Oral QHS  . tiotropium  18 mcg Inhalation Daily    Assessment: 81 y.o. male with a past medical history significant for CAD and COPD, admitted with dyspnea at rest.  Pharmacy consulted to dose heparin for AFib.  H/H 10.4/31.4 (2/16) Plts 133 (2/16)  Goal of Therapy:  Heparin level 0.3-0.7 units/ml  Monitor platelets by anticoagulation protocol  Plan:   Lab to draw PTT/PT/INR  No bolus, Start heparin drip at 1200 Units/hr  Heparin level in 8 hours  Daily heparin level/CBC  Dolly Rias RPh 04/23/2014, 2:13 PM Pager [phone]

## 2014-04-23 NOTE — Consult Note (Signed)
Reason for Consult: Dyspnea at rest, tachycardia, mildly abnormal cardiac troponin I  Requesting Physician: Aileen Fass  Cardiologist: Mare Ferrari  HPI: This is a 81 y.o. male with a past medical history significant for CAD and COPD, admitted with dyspnea at rest. He has persistent nonproductive cough, wheezing but no clear-cut symptoms of pharyngitis, fever, chills, pleuritic chest pain. He has not had lower extremity edema or noticeable change in his weight. He does not have exertional chest discomfort, it has never manifested with exertional angina (He presented in April 2014 with symptoms of dyspnea on exertion. Subsequent Myoview study showed significant inferior wall ischemia. He underwent cardiac catheterization on 08/27/2012. This demonstrated ostial occlusion of the right coronary with excellent left to right collateral flow. He had a high-grade proximal LAD stenosis. LV function was well-preserved. He subsequently underwent successful DES stenting of his LAD on 09/26/12).  He will be on lifelong Plavix. He is allergic to aspirin.  He had a P2Y12 assay which showed an excellent response of Plavix. The patient has a past history of dyslipidemia and has had good response to low-dose Lipitor 10 mg daily. He also has a history of bladder cancer and is followed by Dr.Ottelin and a history of CLL for which he is followed by Dr. Broadus John 0. Moore at [facility]. His echocardiogram performed on this admission showed preserved left ventricular systolic function and regional wall motion, but the Doppler analysis suggests elevated mean left atrial pressure. His BNP is mildly elevated at 484, but unfortunately there is no baseline available for comparison. His cardiac troponin has been marginally elevated at around 0.8 in a "plateau" pattern not with a typical rise and fall seen with a true acute coronary syndrome.  Electrocardiogram performed on this admission shows sinus rhythm on arrival, atrial  fibrillation with rapid ventricular response yesterday. They both show left ventricular hypertrophy and diffuse ST segment depressionworsened in the setting of tachycardia. When the heart rate was slower the ST changes were much less evident.  PMHx:  Past Medical History  Diagnosis Date  . Osteopenia 2006  . Hyperlipidemia   . BPH (benign prostatic hypertrophy)   . Transient global amnesia   . Osteoarthritis   . History of TIA (transient ischemic attack) 1980'S    NO RESIDUAL  . COPD (chronic obstructive pulmonary disease) with emphysema   . Chronic back pain   . Itching SECONDARY TO CLL-- CONTROLLED W/ SINGULAIR  . Frequency   . Nocturia   . Hypertension CARDIOLOGIST- DR BRACKBILL-- LAST VISIT 12-15-2010 NOTE IN EPIC  . CLL (chronic lymphocytic leukemia) LAST PLT COUNT APRL 2012  168    Managed at [facility]  . Bladder cancer   . Aspirin allergy     Eyes swell  . Asthma   . Dyspnea on exertion   . CAD (coronary artery disease)     a. 08/2012 Cath: LM nl, LAD 90p, LCX 20-30, RCA 100ost fills via L->R collats, EF 55-65%;  b. 09/2012 PCI of prox LAD with 3.0x16 Promus DES.   Past Surgical History  Procedure Laterality Date  . Excisional hemorrhoidectomy  1980  . Cardiovascular stress test  07/2005    a. 07/2005- no reversible ischemia, normal EF b. 06/2009- EF 73%, no reversible ischemia, inferolateral TWIs at rest, upright in recovery c. 08/2012- mediuim-sized partially reversible basal to mid inferior and inferoseptal perfusion defect c/w with prior infarction and peri-infarct ischemia; EF 68% and borderline ST/T changes on stress ECG   .  Transurethral resection of bladder tumor  10-19-2008    AND DILATION URETHRAL STRICTURE  . Cataract extraction w/ intraocular lens  implant, bilateral Bilateral ~ 2007  . Transthoracic echocardiogram  01-03-2011    LVEF 14-38%, grade 1 diastolic dysfunction, no WMAs or structural abnormalities  . Cystoscopy with biopsy  10/30/2011     Procedure: CYSTOSCOPY WITH BIOPSY;  Surgeon: Claybon Jabs, MD;  Location: [facility];  Service: Urology;  Laterality: N/A;  . Cardiac catheterization  08/27/2012  . Coronary angioplasty with stent placement  09/26/2012    "1" (09/26/2012)  . Coronary stent placement  09/26/12  . Left heart catheterization with coronary angiogram N/A 08/27/2012    Procedure: LEFT HEART CATHETERIZATION WITH CORONARY ANGIOGRAM;  Surgeon: Peter M Martinique, MD;  Location: [facility] CATH LAB;  Service: Cardiovascular;  Laterality: N/A;  . Percutaneous coronary stent intervention (pci-s) N/A 09/26/2012    Procedure: PERCUTANEOUS CORONARY STENT INTERVENTION (PCI-S);  Surgeon: Peter M Martinique, MD;  Location: [facility] C[facility] LAB;  Service: Cardiovascular;  Laterality: N/A;    FAMHx: Family History  Problem Relation Age of Onset  . Emphysema Father   . Heart disease Father   . COPD Father   . Atrial fibrillation Father   . Kidney cancer Mother   . Kidney failure Mother     SOCHx:  reports that he quit smoking about 54 years ago. His smoking use included Cigarettes. He has a 25 pack-year smoking history. He quit smokeless tobacco use about 35 years ago. He reports that he drinks alcohol. He reports that he does not use illicit drugs.  ALLERGIES: Allergies  Allergen Reactions  . Aspirin Anaphylaxis, Shortness Of Breath and Swelling  . Nsaids Anaphylaxis, Shortness Of Breath and Swelling  . Septra [Sulfamethoxazole-Trimethoprim] Rash  . Benicar [Olmesartan Medoxomil]     dizzy  . Salicylates     SOB  . Ampicillin Rash    ROS: Constitutional: positive for malaise, negative for chills, fevers, sweats and weight loss Eyes: negative Ears, nose, mouth, throat, and face: negative for hoarseness, nasal congestion and sore throat Respiratory: positive for cough, dyspnea on exertion and wheezing, negative for hemoptysis, pleurisy/chest pain and sputum Cardiovascular: positive for dyspnea, negative for lower extremity  edema, orthopnea, palpitations and paroxysmal nocturnal dyspnea Gastrointestinal: positive for "indigestion", negative for abdominal pain, dysphagia, melena, nausea and vomiting Genitourinary:negative Integument/breast: negative Hematologic/lymphatic: negative for bleeding, easy bruising and petechiae Musculoskeletal:negative Neurological: negative Behavioral/Psych: negative Endocrine: negative Allergic/Immunologic: negative  HOME MEDICATIONS: Facility-administered medications prior to admission  Medication Dose Route Frequency Provider Last Rate Last Dose  . testosterone cypionate (DEPOTESTOTERONE CYPIONATE) injection 200 mg  200 mg Intramuscular Q28 days Chipper Herb, MD   200 mg at 04/13/14 1057   Prescriptions prior to admission  Medication Sig Dispense Refill Last Dose  . albuterol (VENTOLIN HFA) 108 (90 BASE) MCG/ACT inhaler Inhale 2 puffs into the lungs every 6 (six) hours as needed for wheezing.    04/20/2014 at Unknown time  . Azelastine HCl (ASTELIN NA) Place 1 spray into the nose as needed (for congestion).    unknown  . carvedilol (COREG) 12.5 MG tablet TAKE one tablet by mouth twice daily   04/21/2014 at 0900  . Cholecalciferol (VITAMIN D3) 1000 UNITS CAPS Take 1 capsule by mouth daily.    04/20/2014 at Unknown time  . cimetidine (TAGAMET) 300 MG tablet TAKE 1 TABLET UP TO 4 TIMES A DAY (Patient taking differently: TAKE 1 TABLET UP TO 4 TIMES A DAY as  needed) 40 tablet 11 unknown  . clopidogrel (PLAVIX) 75 MG tablet TAKE 1 TABLET ONCE A DAY 90 tablet 1 04/21/2014 at Unknown time  . DEPO-TESTOSTERONE 200 MG/ML injection INJECT 1 ML INTRAMUSCULARLY MONTHLY 3 mL 1 Past Week at Unknown time  . diphenhydramine-acetaminophen (TYLENOL PM) 25-500 MG TABS Take 1 tablet by mouth at bedtime as needed. sleep   unknown  . doxycycline (VIBRAMYCIN) 100 MG capsule Take 1 capsule (100 mg total) by mouth 2 (two) times daily. As directed 50 capsule 2 04/21/2014 at Unknown time  . fluticasone  (FLONASE) 50 MCG/ACT nasal spray 2 SPRAYS INTO EACH NOSTRIL ONCE A DAY (Patient taking differently: 2 SPRAYS INTO EACH NOSTRIL BID prn for allergies) 16 g 3 04/20/2014 at Unknown time  . furosemide (LASIX) 20 MG tablet TAKE (1) TABLET DAILY AS DIRECTED. (Patient taking differently: TAKE (1) TABLET DAILY AS DIRECTED as needed for swelling) 30 tablet 2 unknown  . LIPITOR 10 MG tablet TAKE 1 TABLET DAILY 90 tablet 1 04/21/2014 at Unknown time  . losartan (COZAAR) 50 MG tablet TAKE (1) TABLET TWICE A DAY. 180 tablet 1 04/21/2014 at Unknown time  . montelukast (SINGULAIR) 10 MG tablet TAKE ONE TABLET AT BEDTIME 90 tablet 2 04/20/2014 at Unknown time  . nitroGLYCERIN (NITROSTAT) 0.4 MG SL tablet Place 1 tablet (0.4 mg total) under the tongue every 5 (five) minutes as needed for chest pain. 25 tablet 3 unknown  . NORVASC 5 MG tablet TAKE UP TO 2 TABLETS ONCE DAILY AS DIRECTED (Patient taking differently: TAKE One and a half tablet= 7.5 mg  ONCE DAILY) 180 tablet 1 04/21/2014 at Unknown time  . Omega-3 Fatty Acids (FISH OIL) 1200 MG CAPS Take 1 capsule by mouth daily.    04/21/2014 at Unknown time  . SYMBICORT 160-4.5 MCG/ACT inhaler 2 PUFFS EVERY 12 HOURS 10.2 g 1 04/21/2014 at Unknown time  . tiotropium (SPIRIVA HANDIHALER) 18 MCG inhalation capsule INHALE THE CONTENTS OF ONE CAPSULE ONCE DAILY AS DIRECTED 30 capsule 3 04/21/2014 at Unknown time  . traMADol (ULTRAM) 50 MG tablet TAKE 1 TABLET UP TO 2 TIMES A DAY (Patient taking differently: TAKE 25mg  by mouth 2 TIMES A DAY) 60 tablet 0 04/21/2014 at Unknown time  . triamcinolone cream (KENALOG) 0.1 % APPLY TO AFFECTED AREAS 2 TIMES A DAY AS NEEDED 45 g 3 unknown    HOSPITAL MEDICATIONS: Prior to Admission:  Facility-administered medications prior to admission  Medication Dose Route Frequency Provider Last Rate Last Dose  . testosterone cypionate (DEPOTESTOTERONE CYPIONATE) injection 200 mg  200 mg Intramuscular Q28 days Chipper Herb, MD   200 mg at 04/13/14  1057   Prescriptions prior to admission  Medication Sig Dispense Refill Last Dose  . albuterol (VENTOLIN HFA) 108 (90 BASE) MCG/ACT inhaler Inhale 2 puffs into the lungs every 6 (six) hours as needed for wheezing.    04/20/2014 at Unknown time  . Azelastine HCl (ASTELIN NA) Place 1 spray into the nose as needed (for congestion).    unknown  . carvedilol (COREG) 12.5 MG tablet TAKE one tablet by mouth twice daily   04/21/2014 at 0900  . Cholecalciferol (VITAMIN D3) 1000 UNITS CAPS Take 1 capsule by mouth daily.    04/20/2014 at Unknown time  . cimetidine (TAGAMET) 300 MG tablet TAKE 1 TABLET UP TO 4 TIMES A DAY (Patient taking differently: TAKE 1 TABLET UP TO 4 TIMES A DAY as needed) 40 tablet 11 unknown  . clopidogrel (PLAVIX) 75 MG tablet TAKE  1 TABLET ONCE A DAY 90 tablet 1 04/21/2014 at Unknown time  . DEPO-TESTOSTERONE 200 MG/ML injection INJECT 1 ML INTRAMUSCULARLY MONTHLY 3 mL 1 Past Week at Unknown time  . diphenhydramine-acetaminophen (TYLENOL PM) 25-500 MG TABS Take 1 tablet by mouth at bedtime as needed. sleep   unknown  . doxycycline (VIBRAMYCIN) 100 MG capsule Take 1 capsule (100 mg total) by mouth 2 (two) times daily. As directed 50 capsule 2 04/21/2014 at Unknown time  . fluticasone (FLONASE) 50 MCG/ACT nasal spray 2 SPRAYS INTO EACH NOSTRIL ONCE A DAY (Patient taking differently: 2 SPRAYS INTO EACH NOSTRIL BID prn for allergies) 16 g 3 04/20/2014 at Unknown time  . furosemide (LASIX) 20 MG tablet TAKE (1) TABLET DAILY AS DIRECTED. (Patient taking differently: TAKE (1) TABLET DAILY AS DIRECTED as needed for swelling) 30 tablet 2 unknown  . LIPITOR 10 MG tablet TAKE 1 TABLET DAILY 90 tablet 1 04/21/2014 at Unknown time  . losartan (COZAAR) 50 MG tablet TAKE (1) TABLET TWICE A DAY. 180 tablet 1 04/21/2014 at Unknown time  . montelukast (SINGULAIR) 10 MG tablet TAKE ONE TABLET AT BEDTIME 90 tablet 2 04/20/2014 at Unknown time  . nitroGLYCERIN (NITROSTAT) 0.4 MG SL tablet Place 1 tablet (0.4 mg  total) under the tongue every 5 (five) minutes as needed for chest pain. 25 tablet 3 unknown  . NORVASC 5 MG tablet TAKE UP TO 2 TABLETS ONCE DAILY AS DIRECTED (Patient taking differently: TAKE One and a half tablet= 7.5 mg  ONCE DAILY) 180 tablet 1 04/21/2014 at Unknown time  . Omega-3 Fatty Acids (FISH OIL) 1200 MG CAPS Take 1 capsule by mouth daily.    04/21/2014 at Unknown time  . SYMBICORT 160-4.5 MCG/ACT inhaler 2 PUFFS EVERY 12 HOURS 10.2 g 1 04/21/2014 at Unknown time  . tiotropium (SPIRIVA HANDIHALER) 18 MCG inhalation capsule INHALE THE CONTENTS OF ONE CAPSULE ONCE DAILY AS DIRECTED 30 capsule 3 04/21/2014 at Unknown time  . traMADol (ULTRAM) 50 MG tablet TAKE 1 TABLET UP TO 2 TIMES A DAY (Patient taking differently: TAKE 25mg  by mouth 2 TIMES A DAY) 60 tablet 0 04/21/2014 at Unknown time  . triamcinolone cream (KENALOG) 0.1 % APPLY TO AFFECTED AREAS 2 TIMES A DAY AS NEEDED 45 g 3 unknown   Scheduled: . amLODipine  5 mg Oral Daily  . atorvastatin  10 mg Oral q1800  . budesonide-formoterol  2 puff Inhalation BID  . clopidogrel  75 mg Oral Daily  . furosemide  40 mg Intravenous Once  . [START ON 04/24/2014] furosemide  40 mg Oral Daily  . heparin  5,000 Units Subcutaneous 3 times per day  . levalbuterol  0.63 mg Nebulization Q6H  . levofloxacin (LEVAQUIN) IV  500 mg Intravenous Q24H  . losartan  50 mg Oral BID  . methylPREDNISolone (SOLU-MEDROL) injection  60 mg Intravenous 3 times per day  . metoprolol tartrate  25 mg Oral BID  . montelukast  10 mg Oral QHS  . tiotropium  18 mcg Inhalation Daily   Continuous:   VITALS: Blood pressure 131/83, pulse 113, temperature 97.5 F (36.4 C), temperature source Oral, resp. rate 18, height 5\' 8"  (1.727 m), weight 76.204 kg (168 lb), SpO2 96 %.  PHYSICAL EXAM:  General: Alert, oriented x3, no distress Head: no evidence of trauma, PERRL, EOMI, no exophtalmos or lid lag, no myxedema, no xanthelasma; normal ears, nose and oropharynx Neck: normal  jugular venous pulsations and no hepatojugular reflux; brisk carotid pulses without delay and  no carotid bruits Chest: diminished breath sounds throughout and scattered wheezing, no signs of consolidation by percussion or palpation, normal fremitus, symmetrical and full respiratory excursions Cardiovascular: normal position and quality of the apical impulse, regular rhythm, distant but otherwisenormal first heart sound and normal second heart sound, no rubs or gallops, no murmur Abdomen: no tenderness or distention, no masses by palpation, no abnormal pulsatility or arterial bruits, normal bowel sounds, no hepatosplenomegaly Extremities: no clubbing, cyanosis;  no edema; 2+ radial, ulnar and brachial pulses bilaterally; 2+ right femoral, posterior tibial and dorsalis pedis pulses; 2+ left femoral, posterior tibial and dorsalis pedis pulses; no subclavian or femoral bruits Neurological: grossly nonfocal   LABS  CBC  Recent Labs  04/21/14 1541 04/22/14 0515  WBC 11.2* 8.9  NEUTROABS 5.5  --   HGB 10.7* 10.4*  HCT 32.8* 31.4*  MCV 97.0 96.0  PLT 140* 086*   Basic Metabolic Panel  Recent Labs  04/21/14 1541 04/22/14 0515  NA 140 139  K 4.6 3.9  CL 103 101  CO2 27 28  GLUCOSE 110* 101*  BUN 14 12  CREATININE 0.96 0.97  CALCIUM 9.0 8.7  MG 1.9  --    Liver Function Tests  Recent Labs  04/21/14 1541  AST 24  ALT 18  ALKPHOS 55  BILITOT 1.1  PROT 6.6  ALBUMIN 4.5   No results for input(s): LIPASE, AMYLASE in the last 72 hours. Cardiac Enzymes  Recent Labs  04/21/14 1541 04/21/14 2237 04/22/14 0515  TROPONINI 0.79* 0.79* 0.61*   BNP Invalid input(s): POCBNP D-Dimer No results for input(s): DDIMER in the last 72 hours. Hemoglobin A1C No results for input(s): HGBA1C in the last 72 hours. Fasting Lipid Panel No results for input(s): CHOL, HDL, LDLCALC, TRIG, CHOLHDL, LDLDIRECT in the last 72 hours. Thyroid Function Tests No results for input(s): TSH, T4TOTAL,  T3FREE, THYROIDAB in the last 72 hours.  Invalid input(s): FREET3    IMAGING: X-ray Chest Pa And Lateral  04/21/2014   CLINICAL DATA:  Shortness of breath, hypoxia  EXAM: CHEST  2 VIEW  COMPARISON:  [place] PA and lateral chest radiographs 04/21/2014 at 12:30 p.m., available on [facility] PACS  FINDINGS: The heart size and mediastinal contours are within normal limits. Both lungs are clear but hyperinflated suggesting emphysema. Left lower lobe calcified granuloma reidentified. The visualized skeletal structures are unremarkable.  IMPRESSION: No significant change since exam performed earlier today at [place]. Stable mild hyperinflation suggesting emphysema.   Electronically Signed   By: Conchita Paris M.D.   On: 04/21/2014 16:40   Dg Chest Port 1 View  04/23/2014   CLINICAL DATA:  Cough and wheezing.  EXAM: PORTABLE CHEST - 1 VIEW  COMPARISON:  04/21/2014  FINDINGS: Lungs are adequately inflated without consolidation or effusion. Cardiomediastinal silhouette is within normal. There is calcified plaque over the thoracic aorta. Remainder of the exam is unchanged.  IMPRESSION: No active disease.   Electronically Signed   By: Marin Olp M.D.   On: 04/23/2014 10:50    ECG: Admission - normal sinus rhythm, mild ST segment depression probably due to LVH February 17 - atrial fibrillation rapid ventricular response, prominent diffuse ST segment depression  TELEMETRY: Atrial fibrillation with rapid ventricular response until 1223h today  IMPRESSION: 1. Acute COPD exacerbation appears to be the most likely explanation for his shortness of breath, but is likely not the only current problem. Bronchodilators may have contributed to the tachyarrhythmia. Nonselective beta blocker  such as carvedilol may be worsening his wheezing 2. There is also suggestion that he has acute exacerbation of diastolic heart failure (mildly elevated BNP and mitral inflow  suggestive of elevated mean left atrial pressure) 3. CAD with chronic total occlusion of the right coronary artery and stent for high-grade stenosis of the LAD artery in July 2014. Elevated cardiac enzymes most likely secondary to heart failure exacerbation/atrial fibrillation, but there is a chance that he has late in-stent restenosis roughly 18 months status post placement of a drug-eluting stent to the LAD artery. I don't think he has a true acute (atherothrombotic) coronary syndrome, but he will need further assessment for coronary insufficiency  RECOMMENDATION: 1. Stop carvedilol and replace with metoprolol 2. One dose of intravenous diuretic and increased daily maintenance dose 3. Start intravenous heparin for atrial fibrillation 4. No good option for noninvasive assessment for ischemia (not a candidate for treadmill stress testing, Lexiscan may worsen wheezing, dobutamine may precipitate arrhythmia). After we improve his breathing, best option may actually berepeat coronary angiography  Time Spent Directly with Patient:  60 minutes  Sanda Klein, MD, [facility] [phone] office [phone] pager   04/23/2014, 1:10 PM

## 2014-04-23 NOTE — Evaluation (Signed)
Physical Therapy Evaluation Patient Details Name: Johnathan Strong MRN: [id number] DOB: Feb 25, 1924 Today's Date: 04/23/2014   History of Present Illness  81 yo male admitted with acure resp failure. Hx of CAD, HTN, osteopenia, COPD, TIA, chronic back pain, CLL.   Clinical Impression  On eval, pt required Min assist for mobility-able to ambulate ~60 feet with 1 HHA. Very unsteady at times-LOB x2 during session. O2 sats 89% on RA. Audible wheezing when mobilizing. Discussed d/c plan-may need to consider ST rehab placement if mobility/activity tolerance do not improve. Encouraged pt/daughter to have nursing assist with walking with pt (with walker) later today if able/tolerated.    Follow Up Recommendations Home health PT;Supervision/Assistance - 24 hour;SNF (depending on progress. Wife cannot provide physical assistance)    Equipment Recommendations  Rolling walker with 5" wheels    Recommendations for Other Services       Precautions / Restrictions Precautions Precautions: Fall Precaution Comments: monitor HR, O2 sats Restrictions Weight Bearing Restrictions: No      Mobility  Bed Mobility Overal bed mobility: Needs Assistance Bed Mobility: Supine to Sit;Sit to Supine     Supine to sit: Supervision Sit to supine: Supervision      Transfers Overall transfer level: Needs assistance Equipment used: 1 person hand held assist Transfers: Sit to/from Stand Sit to Stand: Min assist         General transfer comment: Assist to rise, stabilize, control descent. LOB x 2 while standing beside bed. Pt reported some mild lightheadedness as well.   Ambulation/Gait Ambulation/Gait assistance: Min assist Ambulation Distance (Feet): 60 Feet Assistive device: 1 person hand held assist Gait Pattern/deviations: Step-through pattern;Decreased stride length     General Gait Details: unsteady requiring Min assist to stabilize. Audible wheezing when mobilizing. O2 sats 89% on RA.    Stairs            Wheelchair Mobility    Modified Rankin (Stroke Patients Only)       Balance Overall balance assessment: Needs assistance         Standing balance support: Single extremity supported;During functional activity Standing balance-Leahy Scale: Fair                               Pertinent Vitals/Pain Pain Assessment: No/denies pain    Home Living Family/patient expects to be discharged to:: Private residence Living Arrangements: Spouse/significant other   Type of Home: House Home Access: Stairs to enter Entrance Stairs-Rails: Right Entrance Stairs-Number of Steps: 4 Home Layout: Two level Home Equipment: Cane - single point      Prior Function Level of Independence: Independent               Hand Dominance        Extremity/Trunk Assessment   Upper Extremity Assessment: Generalized weakness           Lower Extremity Assessment: Generalized weakness      Cervical / Trunk Assessment: Kyphotic  Communication   Communication: No difficulties  Cognition Arousal/Alertness: Awake/alert Behavior During Therapy: WFL for tasks assessed/performed Overall Cognitive Status: Within Functional Limits for tasks assessed                      General Comments      Exercises        Assessment/Plan    PT Assessment Patient needs continued PT services  PT Diagnosis Difficulty walking;Generalized weakness   PT Problem List  Decreased strength;Decreased activity tolerance;Decreased balance;Decreased mobility;Decreased knowledge of use of DME  PT Treatment Interventions DME instruction;Gait training;Functional mobility training;Therapeutic activities;Therapeutic exercise;Patient/family education;Balance training   PT Goals (Current goals can be found in the Care Plan section) Acute Rehab PT Goals Patient Stated Goal: to get better PT Goal Formulation: With patient/family Time For Goal Achievement:  05/07/14 Potential to Achieve Goals: Good    Frequency Min 3X/week   Barriers to discharge        Co-evaluation               End of Session Equipment Utilized During Treatment: Gait belt Activity Tolerance: Patient limited by fatigue Patient left: in bed;with call bell/phone within reach;with family/visitor present           Time: 3953-2023 PT Time Calculation (min) (ACUTE ONLY): 20 min   Charges:   PT Evaluation $Initial PT Evaluation Tier I: 1 Procedure     PT G Codes:        Weston Anna, MPT Pager: [phone]

## 2014-04-23 NOTE — Progress Notes (Signed)
Rt did not give Albuterol to pt this am due to pt HR 132.

## 2014-04-23 NOTE — Progress Notes (Signed)
Patient with suspicious atrial fibrillation on monitor.  Notified MD, and discussed with MD on floor.

## 2014-04-24 LAB — CBC
HEMATOCRIT: 33 % — AB (ref 39.0–52.0)
HEMOGLOBIN: 11.1 g/dL — AB (ref 13.0–17.0)
MCH: 31.7 pg (ref 26.0–34.0)
MCHC: 33.6 g/dL (ref 30.0–36.0)
MCV: 94.3 fL (ref 78.0–100.0)
Platelets: 203 10*3/uL (ref 150–400)
RBC: 3.5 MIL/uL — ABNORMAL LOW (ref 4.22–5.81)
RDW: 20 % — AB (ref 11.5–15.5)
WBC: 19 10*3/uL — ABNORMAL HIGH (ref 4.0–10.5)

## 2014-04-24 LAB — BASIC METABOLIC PANEL
ANION GAP: 5 (ref 5–15)
BUN: 26 mg/dL — ABNORMAL HIGH (ref 6–23)
CALCIUM: 8.3 mg/dL — AB (ref 8.4–10.5)
CHLORIDE: 98 mmol/L (ref 96–112)
CO2: 30 mmol/L (ref 19–32)
CREATININE: 1.12 mg/dL (ref 0.50–1.35)
GFR calc Af Amer: 65 mL/min — ABNORMAL LOW (ref >90)
GFR calc non Af Amer: 56 mL/min — ABNORMAL LOW (ref >90)
Glucose, Bld: 151 mg/dL — ABNORMAL HIGH (ref 70–99)
Potassium: 3.9 mmol/L (ref 3.5–5.1)
SODIUM: 133 mmol/L — AB (ref 135–145)

## 2014-04-24 LAB — HEPARIN LEVEL (UNFRACTIONATED)
HEPARIN UNFRACTIONATED: 0.42 [IU]/mL (ref 0.30–0.70)
Heparin Unfractionated: 0.75 IU/mL — ABNORMAL HIGH (ref 0.30–0.70)

## 2014-04-24 MED ORDER — PREDNISONE 50 MG PO TABS
50.0000 mg | ORAL_TABLET | Freq: Every day | ORAL | Status: DC
  Filled 2014-04-24 (×2): qty 1

## 2014-04-24 MED ORDER — DIPHENHYDRAMINE HCL 25 MG PO CAPS
25.0000 mg | ORAL_CAPSULE | Freq: Once | ORAL | Status: AC
  Administered 2014-04-24: 25 mg via ORAL
  Filled 2014-04-24: qty 1

## 2014-04-24 MED ORDER — LEVOFLOXACIN 500 MG PO TABS
500.0000 mg | ORAL_TABLET | Freq: Every day | ORAL | Status: DC
  Administered 2014-04-24 – 2014-04-27 (×4): 500 mg via ORAL
  Filled 2014-04-24 (×5): qty 1

## 2014-04-24 MED ORDER — PREDNISONE 50 MG PO TABS
60.0000 mg | ORAL_TABLET | Freq: Every day | ORAL | Status: DC
  Administered 2014-04-24: 60 mg via ORAL
  Filled 2014-04-24 (×4): qty 1

## 2014-04-24 NOTE — Progress Notes (Signed)
[facility] PROGRESS NOTE  Assessment/Plan: Acute respiratory failure with hypoxia due to   COPD GOLD III and acute bronchitis - Started empirically on inhalers, antibiotics and steroids, wheezing has improved. - Cont Guaifenesin, CXR clear. - Cont Xopenex.   Essential Hypertension: - Continue current home meds.  Sinus tachycardia/ASCVD (arteriosclerotic cardiovascular disease)/  CAD (coronary artery disease): - 2-D echo grade 1 diastolic heart failure, with no wall motion abnormalities. - Continue Plavix. - Cardiology was consulted they recommended to start heparin and watch him for 48 hours.   Chronic lymphocytic leukemia   Hyperlipidemia: - Continue Lipitor.   Code Status: full Family Communication: Discussed with daughter at bedside.  Disposition Plan: Patient   Consultants:  None  Procedures:  Chest x-ray  Antibiotics:  Levaquin  HPI/Subjective: He relates his shortness of breath is improved.  Objective: Filed Vitals:   04/23/14 2003 04/23/14 2134 04/24/14 0218 04/24/14 0537  BP:  174/73  158/70  Pulse:  83  68  Temp:  98.2 F (36.8 C)  98.4 F (36.9 C)  TempSrc:  Oral  Oral  Resp:    20  Height:      Weight:      SpO2: 97% 98% 93% 98%    Intake/Output Summary (Last 24 hours) at 04/24/14 0829 Last data filed at 04/24/14 0813  Gross per 24 hour  Intake 1185.6 ml  Output    500 ml  Net  685.6 ml   Filed Weights   04/21/14 1434  Weight: 76.204 kg (168 lb)    Exam:  General: Alert, awake, oriented x3, in no acute distress.  HEENT: No bruits, no goiter.  Heart: Regular rate and rhythm. Lungs: Moderate air movement, with mild expiratory wheezing. Abdomen: Soft, nontender, nondistended, positive bowel sounds.  Neuro: Grossly intact, nonfocal.   Data Reviewed: Basic Metabolic Panel:  Recent Labs Lab 04/21/14 1541 04/22/14 0515  NA 140 139  K 4.6 3.9  CL 103 101  CO2 27 28  GLUCOSE 110* 101*  BUN 14 12  CREATININE  0.96 0.97  CALCIUM 9.0 8.7  MG 1.9  --    Liver Function Tests:  Recent Labs Lab 04/21/14 1541  AST 24  ALT 18  ALKPHOS 55  BILITOT 1.1  PROT 6.6  ALBUMIN 4.5   No results for input(s): LIPASE, AMYLASE in the last 168 hours. No results for input(s): AMMONIA in the last 168 hours. CBC:  Recent Labs Lab 04/21/14 1541 04/22/14 0515 04/24/14 0535  WBC 11.2* 8.9 19.0*  NEUTROABS 5.5  --   --   HGB 10.7* 10.4* 11.1*  HCT 32.8* 31.4* 33.0*  MCV 97.0 96.0 94.3  PLT 140* 133* 203   Cardiac Enzymes:  Recent Labs Lab 04/21/14 1541 04/21/14 2237 04/22/14 0515  TROPONINI 0.79* 0.79* 0.61*   BNP (last 3 results)  Recent Labs  04/21/14 1541  BNP 484.0*    ProBNP (last 3 results) No results for input(s): PROBNP in the last 8760 hours.  CBG: No results for input(s): GLUCAP in the last 168 hours.  No results found for this or any previous visit (from the past 240 hour(s)).   Studies: Dg Chest Port 1 View  04/23/2014   CLINICAL DATA:  Cough and wheezing.  EXAM: PORTABLE CHEST - 1 VIEW  COMPARISON:  04/21/2014  FINDINGS: Lungs are adequately inflated without consolidation or effusion. Cardiomediastinal silhouette is within normal. There is calcified plaque over the thoracic aorta. Remainder of the exam is unchanged.  IMPRESSION: No active  disease.   Electronically Signed   By: Marin Olp M.D.   On: 04/23/2014 10:50    Scheduled Meds: . amLODipine  5 mg Oral Daily  . atorvastatin  10 mg Oral q1800  . budesonide-formoterol  2 puff Inhalation BID  . clopidogrel  75 mg Oral Daily  . furosemide  40 mg Oral Daily  . levalbuterol  0.63 mg Nebulization Q6H  . levofloxacin (LEVAQUIN) IV  500 mg Intravenous Q24H  . losartan  50 mg Oral BID  . methylPREDNISolone (SOLU-MEDROL) injection  60 mg Intravenous 3 times per day  . metoprolol tartrate  25 mg Oral BID  . montelukast  10 mg Oral QHS  . tiotropium  18 mcg Inhalation Daily   Continuous Infusions: . heparin 1,200  Units/hr (04/23/14 1432)     Venetia Constable Marguarite Arbour  [facility] Pager [phone]. If 8PM-8AM, please contact night-coverage at [email or web address], password [facility] 04/24/2014, 8:29 AM  LOS: 3 days

## 2014-04-24 NOTE — Progress Notes (Signed)
CARE MANAGEMENT NOTE 04/24/2014  Patient:  Johnathan Strong, Johnathan Strong   Account Number:  [id number]  Date Initiated:  04/22/2014  Documentation initiated by:  Edwyna Shell  Subjective/Objective Assessment:   81 yo male admitted with hypoxia secondary to possible bronchitis from home     Action/Plan:   discharge planning   Anticipated DC Date:  04/24/2014   Anticipated DC Plan:  [place] referral  Clinical Social Worker      DC Planning Services  CM consult      [facility] Choice  HOME HEALTH   Choice offered to / List presented to:  C-1 Patient           Status of service:  In process, will continue to follow Medicare Important Message given?  YES (If response is "NO", the following Medicare IM given date fields will be blank) Date Medicare IM given:  04/24/2014 Medicare IM given by:  Edwyna Shell Date Additional Medicare IM given:   Additional Medicare IM given by:    Discharge Disposition:  [place]  Per UR Regulation:    If discussed at Long Length of Stay Meetings, dates discussed:    Comments:  04/24/14 Edwyna Shell RN BSn CM [phone] Spoke with patient and spouse reagrding recommendation for [facility] PT and OT. Patient and spouse state that his preference is to go home with [facility] services. Provided a [place] agency list for [facility]. They also requested information on agencies that can provided extra support so provided them with a private agency list and explained the services provided. Will continue to follow for discharge needs.  04/22/14 Edwyna Shell RN BSN CM [phone] Patient lives at home with spouse and has a cane. He would like outpatient vs. HH PT if recommended by PT. Await PT recommendations. Patient stated that he has had outpatient rehab through [facility] in the past.

## 2014-04-24 NOTE — Progress Notes (Signed)
Physical Therapy Treatment Patient Details Name: LELEND HEINECKE MRN: [id number] DOB: [birth year] Today's Date: 04/24/2014    History of Present Illness 81 yo male admitted with acure resp failure. Hx of CAD, HTN, osteopenia, COPD, TIA, chronic back pain, CLL.     PT Comments    Progressing with mobility. Pt very motivated to mobilize. O2 sats 90-91% on RA during ambulation. Audible wheezing when mobilizing but improved compared to yesterday. Encouraged pt to ambulate at least 1x/day with nursing supervision.   Follow Up Recommendations  Home health PT;Supervision/Assistance - 24 hour     Equipment Recommendations  Rolling walker with 5" wheels (for safety)    Recommendations for Other Services       Precautions / Restrictions Precautions Precautions: Fall Precaution Comments: monitor HR, O2 sats Restrictions Weight Bearing Restrictions: No    Mobility  Bed Mobility Overal bed mobility: Needs Assistance Bed Mobility: Supine to Sit     Supine to sit: Min assist     General bed mobility comments: 1 handheld assist for pt to get to EOB  Transfers Overall transfer level: Needs assistance   Transfers: Sit to/from Stand Sit to Stand: Min guard         General transfer comment: close guard for safety. Pt reported mild lightheadedness.   Ambulation/Gait Ambulation/Gait assistance: Min guard Ambulation Distance (Feet): 700 Feet Assistive device:  (IV pole) Gait Pattern/deviations: Step-through pattern     General Gait Details: close guard for safety. Pt tolerated activity well. A few brief standing rest breaks to catch his breath. O2 sats 90-91% on RA   Stairs            Wheelchair Mobility    Modified Rankin (Stroke Patients Only)       Balance           Standing balance support: Single extremity supported;During functional activity Standing balance-Leahy Scale: Fair                      Cognition Arousal/Alertness:  Awake/alert Behavior During Therapy: WFL for tasks assessed/performed Overall Cognitive Status: Within Functional Limits for tasks assessed                      Exercises      General Comments        Pertinent Vitals/Pain Pain Assessment: No/denies pain    Home Living                      Prior Function            PT Goals (current goals can now be found in the care plan section) Progress towards PT goals: Progressing toward goals    Frequency  Min 3X/week    PT Plan Current plan remains appropriate    Co-evaluation             End of Session Equipment Utilized During Treatment: Gait belt Activity Tolerance: Patient tolerated treatment well Patient left: in chair;with call bell/phone within reach;with family/visitor present     Time: 1014-1040 PT Time Calculation (min) (ACUTE ONLY): 26 min  Charges:  $Gait Training: 23-37 mins                    G Codes:      Weston Anna, MPT Pager: [phone]

## 2014-04-24 NOTE — Progress Notes (Signed)
Pharmacy Consult for Heparin Indication: atrial fibrillation  See previous Pharmacy note for full details.  Heparin level = 0.42 (therapeutic) on 1100 units/hr No bleeding reported  Plan:  Continue current heparin rate  F/u heparin level in AM  Peggyann Juba, PharmD, BCPS Pager: [phone] 04/24/2014 7:32 PM

## 2014-04-24 NOTE — Progress Notes (Signed)
ANTICOAGULATION CONSULT NOTE - Follow Up Consult  Pharmacy Consult for Heparin Indication: atrial fibrillation  Allergies  Allergen Reactions  . Aspirin Anaphylaxis, Shortness Of Breath and Swelling  . Nsaids Anaphylaxis, Shortness Of Breath and Swelling  . Septra [Sulfamethoxazole-Trimethoprim] Rash  . Benicar [Olmesartan Medoxomil]     dizzy  . Salicylates     SOB  . Ampicillin Rash    Patient Measurements: Height: 5\' 8"  (172.7 cm) Weight: 168 lb (76.204 kg) IBW/kg (Calculated) : 68.4 Heparin Dosing Weight:   Vital Signs: Temp: 98.4 F (36.9 C) (02/19 0537) Temp Source: Oral (02/19 0537) BP: 158/70 mmHg (02/19 0537) Pulse Rate: 68 (02/19 0537)  Labs:  Recent Labs  04/21/14 1541 04/21/14 2237 04/22/14 0515 04/23/14 1415 04/23/14 2300 04/24/14 0535  HGB 10.7*  --  10.4*  --   --  11.1*  HCT 32.8*  --  31.4*  --   --  33.0*  PLT 140*  --  133*  --   --  203  APTT  --   --   --  30  --   --   LABPROT  --   --   --  15.1  --   --   INR  --   --   --  1.17  --   --   HEPARINUNFRC  --   --   --   --  0.40  --   CREATININE 0.96  --  0.97  --   --   --   TROPONINI 0.79* 0.79* 0.61*  --   --   --     Estimated Creatinine Clearance: 49.9 mL/min (by C-G formula based on Cr of 0.97).   Medications:  Infusions:  . heparin 1,200 Units/hr (04/23/14 1432)    Assessment: Patient with heparin level at goal.  No issues noted by RN.  Goal of Therapy:  Heparin level 0.3-0.7 units/ml Monitor platelets by anticoagulation protocol: Yes   Plan:  Continue heparin drip at current rate Recheck level at 0800  Tyler Deis, Shea Stakes Crowford 04/24/2014,6:46 AM

## 2014-04-24 NOTE — Progress Notes (Signed)
PROGRESS NOTE  Subjective:    This is a 81 y.o. male with a past medical history significant for CAD and COPD, admitted with dyspnea at rest. He has persistent nonproductive cough, wheezing but no clear-cut symptoms of pharyngitis, fever, chills, pleuritic chest pain. He has not had lower extremity edema or noticeable change in his weight. He does not have exertional chest discomfort, it has never manifested with exertional angina (He presented in April 2014 with symptoms of dyspnea on exertion. Subsequent Myoview study showed significant inferior wall ischemia. He underwent cardiac catheterization on 08/27/2012. This demonstrated ostial occlusion of the right coronary with excellent left to right collateral flow. He had a high-grade proximal LAD stenosis. LV function was well-preserved. He subsequently underwent successful DES stenting of his LAD on 09/26/12).  He was admitted with increased shortness of breath.   Troponin levels are mildly elevated.  He was seen by Cr. Croitoru yesterday and was thought to primarily have a COPD exacerbation.    Echo on 2/17 shows: Left ventricle: The cavity size was normal. There was mild concentric hypertrophy. Systolic function was normal. The estimated ejection fraction was in the range of 60% to 65%. Wall motion was normal; there were no regional wall motion abnormalities. Doppler parameters are consistent with abnormal left ventricular relaxation (grade 1 diastolic dysfunction). Doppler parameters are consistent with high ventricular filling pressure. - Mitral valve: Moderately calcified annulus. There was mild regurgitation. - Left atrium: The atrium was mildly dilated  Objective:    Vital Signs:   Temp:  [97.2 F (36.2 C)-98.4 F (36.9 C)] 98.4 F (36.9 C) (02/19 0537) Pulse Rate:  [68-133] 68 (02/19 0537) Resp:  [18-20] 20 (02/19 0537) BP: (131-174)/(45-83) 158/70 mmHg (02/19 0537) SpO2:  [93 %-98 %] 98 % (02/19  0537)  Last BM Date: 04/22/14   24-hour weight change: Weight change:   Weight trends: Filed Weights   04/21/14 1434  Weight: 168 lb (76.204 kg)    Intake/Output:  02/18 0701 - 02/19 0700 In: 1065.6 [P.O.:780; I.V.:185.6; IV Piggyback:100] Out: 500 [Urine:500]     Physical Exam: BP 158/70 mmHg  Pulse 68  Temp(Src) 98.4 F (36.9 C) (Oral)  Resp 20  Ht 5\' 8"  (1.727 m)  Wt 168 lb (76.204 kg)  BMI 25.55 kg/m2  SpO2 98%  Wt Readings from Last 3 Encounters:  04/21/14 168 lb (76.204 kg)  04/21/14 165 lb (74.844 kg)  03/10/14 163 lb (73.936 kg)    General: Vital signs reviewed and noted.   Head: Normocephalic, atraumatic.  Eyes: conjunctivae/corneas clear.  EOM's intact.   Throat: normal  Neck:  normal   Lungs:    wheezing   Heart:  RR   Abdomen:  Soft, non-tender, non-distended    Extremities: No edema    Neurologic: A&O X3, CN II - XII are grossly intact.   Psych: Normal     Labs: BMET:  Recent Labs  04/21/14 1541 04/22/14 0515  NA 140 139  K 4.6 3.9  CL 103 101  CO2 27 28  GLUCOSE 110* 101*  BUN 14 12  CREATININE 0.96 0.97  CALCIUM 9.0 8.7  MG 1.9  --     Liver function tests:  Recent Labs  04/21/14 1541  AST 24  ALT 18  ALKPHOS 55  BILITOT 1.1  PROT 6.6  ALBUMIN 4.5   No results for input(s): LIPASE, AMYLASE in the last 72 hours.  CBC:  Recent Labs  04/21/14 1541 04/22/14 0515 04/24/14  0535  WBC 11.2* 8.9 19.0*  NEUTROABS 5.5  --   --   HGB 10.7* 10.4* 11.1*  HCT 32.8* 31.4* 33.0*  MCV 97.0 96.0 94.3  PLT 140* 133* 203    Cardiac Enzymes:  Recent Labs  04/21/14 1541 04/21/14 2237 04/22/14 0515  TROPONINI 0.79* 0.79* 0.61*    Coagulation Studies:  Recent Labs  04/23/14 1415  LABPROT 15.1  INR 1.17    Other: Invalid input(s): POCBNP No results for input(s): DDIMER in the last 72 hours. No results for input(s): HGBA1C in the last 72 hours. No results for input(s): CHOL, HDL, LDLCALC, TRIG, CHOLHDL in the  last 72 hours. No results for input(s): TSH, T4TOTAL, T3FREE, THYROIDAB in the last 72 hours.  Invalid input(s): FREET3 No results for input(s): VITAMINB12, FOLATE, FERRITIN, TIBC, IRON, RETICCTPCT in the last 72 hours.   Other results:  Tele   ( personally reviewed )  -  NSR at 82   Medications:    Infusions: . heparin 1,200 Units/hr (04/23/14 1432)    Scheduled Medications: . amLODipine  5 mg Oral Daily  . atorvastatin  10 mg Oral q1800  . budesonide-formoterol  2 puff Inhalation BID  . clopidogrel  75 mg Oral Daily  . furosemide  40 mg Oral Daily  . levalbuterol  0.63 mg Nebulization Q6H  . levofloxacin (LEVAQUIN) IV  500 mg Intravenous Q24H  . losartan  50 mg Oral BID  . methylPREDNISolone (SOLU-MEDROL) injection  60 mg Intravenous 3 times per day  . metoprolol tartrate  25 mg Oral BID  . montelukast  10 mg Oral QHS  . tiotropium  18 mcg Inhalation Daily    Assessment/ Plan:   Principal Problem:   Acute respiratory failure with hypoxia Active Problems:   COPD GOLD III   Hypertension   Chronic lymphocytic leukemia   DOE (dyspnea on exertion)   CAD (coronary artery disease)   Hyperlipemia   ASCVD (arteriosclerotic cardiovascular disease)  IMPRESSION: 1. 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   I suspect that this COPD exacerbation is his primay issue at this tim  There is also suggestion that he has acute exacerbation of diastolic heart failure (mildly elevated BNP and mitral inflow suggestive of elevated mean left atrial pressure).  Continue aggressive treatment of hie bronchitis.    2. CAD with chronic total occlusion of the right coronary artery and stent for high-grade stenosis of the LAD artery in July 2014.   He is not having an angina.   His troponin levels are fairly flat and are not suggestive of ACS.   At this point, I think we should aggressively treat his bronchitis and then see if he he has any  sign of worsening  coronary disease .  He has a known chronic total occlusion of his RCA,    Disposition:  Length of Stay: 3  Thayer Headings, Brooke Bonito., MD, [facility] 04/24/2014, 7:07 AM Office [phone] Pager [phone]

## 2014-04-24 NOTE — Progress Notes (Signed)
ANTICOAGULATION CONSULT NOTE - Follow Up Consult  Pharmacy Consult for Heparin Indication: atrial fibrillation  Allergies  Allergen Reactions  . Aspirin Anaphylaxis, Shortness Of Breath and Swelling  . Nsaids Anaphylaxis, Shortness Of Breath and Swelling  . Septra [Sulfamethoxazole-Trimethoprim] Rash  . Benicar [Olmesartan Medoxomil]     dizzy  . Salicylates     SOB  . Ampicillin Rash    Patient Measurements: Height: 5\' 8"  (172.7 cm) Weight: 168 lb (76.204 kg) IBW/kg (Calculated) : 68.4 Heparin Dosing Weight:   Vital Signs: Temp: 98.4 F (36.9 C) (02/19 0537) Temp Source: Oral (02/19 0537) BP: 158/70 mmHg (02/19 0537) Pulse Rate: 68 (02/19 0537)  Labs:  Recent Labs  04/21/14 1541 04/21/14 2237 04/22/14 0515 04/23/14 1415 04/23/14 2300 04/24/14 0535 04/24/14 0841  HGB 10.7*  --  10.4*  --   --  11.1*  --   HCT 32.8*  --  31.4*  --   --  33.0*  --   PLT 140*  --  133*  --   --  203  --   APTT  --   --   --  30  --   --   --   LABPROT  --   --   --  15.1  --   --   --   INR  --   --   --  1.17  --   --   --   HEPARINUNFRC  --   --   --   --  0.40  --  0.75*  CREATININE 0.96  --  0.97  --   --  1.12  --   TROPONINI 0.79* 0.79* 0.61*  --   --   --   --     Estimated Creatinine Clearance: 43.3 mL/min (by C-G formula based on Cr of 1.12).   Medications:  Infusions:  . heparin 1,200 Units/hr (04/23/14 1432)    Assessment: Patient with heparin level 0.75 (supratherapeutic) .  No issues noted by RN.  Goal of Therapy:  Heparin level 0.3-0.7 units/ml Monitor platelets by anticoagulation protocol: Yes   Plan:  Decrease heparin to 1100 units/hour Recheck level in 8 hours  Dolly Rias RPh 04/24/2014, 9:46 AM Pager [phone]

## 2014-04-25 DIAGNOSIS — J418 Mixed simple and mucopurulent chronic bronchitis: Secondary | ICD-10-CM

## 2014-04-25 LAB — CBC
HCT: 37.1 % — ABNORMAL LOW (ref 39.0–52.0)
Hemoglobin: 12.3 g/dL — ABNORMAL LOW (ref 13.0–17.0)
MCH: 30.8 pg (ref 26.0–34.0)
MCHC: 33.2 g/dL (ref 30.0–36.0)
MCV: 93 fL (ref 78.0–100.0)
PLATELETS: 245 10*3/uL (ref 150–400)
RBC: 3.99 MIL/uL — ABNORMAL LOW (ref 4.22–5.81)
RDW: 19.8 % — ABNORMAL HIGH (ref 11.5–15.5)
WBC: 24.1 10*3/uL — AB (ref 4.0–10.5)

## 2014-04-25 LAB — HEPARIN LEVEL (UNFRACTIONATED): Heparin Unfractionated: 0.47 IU/mL (ref 0.30–0.70)

## 2014-04-25 MED ORDER — FLUCONAZOLE 100MG IVPB
100.0000 mg | INTRAVENOUS | Status: DC
  Administered 2014-04-25 – 2014-04-26 (×2): 100 mg via INTRAVENOUS
  Filled 2014-04-25 (×3): qty 50

## 2014-04-25 MED ORDER — CHLORHEXIDINE GLUCONATE 0.12 % MT SOLN
15.0000 mL | Freq: Two times a day (BID) | OROMUCOSAL | Status: DC
  Administered 2014-04-25 – 2014-04-27 (×4): 15 mL via OROMUCOSAL
  Filled 2014-04-25 (×7): qty 15

## 2014-04-25 MED ORDER — PREDNISONE 20 MG PO TABS
40.0000 mg | ORAL_TABLET | Freq: Every day | ORAL | Status: DC
  Administered 2014-04-25: 40 mg via ORAL
  Filled 2014-04-25 (×4): qty 2

## 2014-04-25 MED ORDER — TRAMADOL HCL 50 MG PO TABS
50.0000 mg | ORAL_TABLET | Freq: Four times a day (QID) | ORAL | Status: DC | PRN
  Administered 2014-04-25: 50 mg via ORAL
  Administered 2014-04-26: 25 mg via ORAL
  Filled 2014-04-25: qty 1

## 2014-04-25 MED ORDER — CETYLPYRIDINIUM CHLORIDE 0.05 % MT LIQD
7.0000 mL | Freq: Two times a day (BID) | OROMUCOSAL | Status: DC
  Administered 2014-04-25 – 2014-04-26 (×3): 7 mL via OROMUCOSAL

## 2014-04-25 MED ORDER — PREDNISONE 50 MG PO TABS: 50.0000 mg | ORAL_TABLET | Freq: Every day | ORAL | Status: DC

## 2014-04-25 NOTE — Progress Notes (Signed)
ANTICOAGULATION CONSULT NOTE - Follow Up Consult  Pharmacy Consult for Heparin Indication: atrial fibrillation  Allergies  Allergen Reactions  . Aspirin Anaphylaxis, Shortness Of Breath and Swelling  . Nsaids Anaphylaxis, Shortness Of Breath and Swelling  . Septra [Sulfamethoxazole-Trimethoprim] Rash  . Benicar [Olmesartan Medoxomil]     dizzy  . Salicylates     SOB  . Ampicillin Rash    Patient Measurements: Height: 5\' 8"  (172.7 cm) Weight: 168 lb (76.204 kg) IBW/kg (Calculated) : 68.4 Heparin Dosing Weight:   Vital Signs: Temp: 97.5 F (36.4 C) (02/20 0543) Temp Source: Oral (02/20 0543) BP: 154/62 mmHg (02/20 0543) Pulse Rate: 67 (02/20 0543)  Labs:  Recent Labs  04/23/14 1415  04/24/14 0535 04/24/14 0841 04/24/14 1820 04/25/14 0511  HGB  --   --  11.1*  --   --  12.3*  HCT  --   --  33.0*  --   --  37.1*  PLT  --   --  203  --   --  245  APTT 30  --   --   --   --   --   LABPROT 15.1  --   --   --   --   --   INR 1.17  --   --   --   --   --   HEPARINUNFRC  --   < >  --  0.75* 0.42 0.47  CREATININE  --   --  1.12  --   --   --   < > = values in this interval not displayed.  Estimated Creatinine Clearance: 43.3 mL/min (by C-G formula based on Cr of 1.12).   Medications:  Infusions:  . heparin 1,100 Units/hr (04/24/14 0955)    Assessment: Patient with heparin level at goal.  No issues noted per RN. Goal of Therapy:  Heparin level 0.3-0.7 units/ml Monitor platelets by anticoagulation protocol: Yes   Plan:  Continue heparin drip at current rate Recheck level as needed  Tyler Deis, Shea Stakes Crowford 04/25/2014,6:16 AM

## 2014-04-25 NOTE — Progress Notes (Signed)
ANTICOAGULATION CONSULT NOTE - Follow Up Consult  Pharmacy Consult for Heparin Indication: atrial fibrillation  Allergies  Allergen Reactions  . Aspirin Anaphylaxis, Shortness Of Breath and Swelling  . Nsaids Anaphylaxis, Shortness Of Breath and Swelling  . Septra [Sulfamethoxazole-Trimethoprim] Rash  . Benicar [Olmesartan Medoxomil]     dizzy  . Salicylates     SOB  . Ampicillin Rash    Patient Measurements: Height: 5\' 8"  (172.7 cm) Weight: 168 lb (76.204 kg) IBW/kg (Calculated) : 68.4 Heparin Dosing Weight:   Vital Signs: Temp: 97.5 F (36.4 C) (02/20 0543) Temp Source: Oral (02/20 0543) BP: 154/62 mmHg (02/20 0543) Pulse Rate: 67 (02/20 0543)  Labs:  Recent Labs  04/23/14 1415  04/24/14 0535 04/24/14 0841 04/24/14 1820 04/25/14 0511  HGB  --   --  11.1*  --   --  12.3*  HCT  --   --  33.0*  --   --  37.1*  PLT  --   --  203  --   --  245  APTT 30  --   --   --   --   --   LABPROT 15.1  --   --   --   --   --   INR 1.17  --   --   --   --   --   HEPARINUNFRC  --   < >  --  0.75* 0.42 0.47  CREATININE  --   --  1.12  --   --   --   < > = values in this interval not displayed.  Estimated Creatinine Clearance: 43.3 mL/min (by C-G formula based on Cr of 1.12).   Medications:  Infusions:  . heparin 1,100 Units/hr (04/25/14 0804)    Assessment: 70 yoM with PMHx CAD with total occlusion of RCA and s/p stent to LAD 7/'14 and COPD gold III presents with SOB likely 2/2 AECOPD as well as possible AECHF.  Started on IV heparin for atrial fibrillation on 2/18.  Currently in NSR. Cardiology consulted, note troponin levels flat, not suggestive of ACS.  2/21:  Heparin level at goal.  Hgb low, stable, plts WNL.  No issues noted with bleeding or infusion.  Renal fxn stable.   Goal of Therapy:  Heparin level 0.3-0.7 units/ml Monitor platelets by anticoagulation protocol: Yes   Plan:  Continue heparin drip at current rate.  F/u D[employer].  F/u duration of therapy for IV  heparin and if necessary, plans for oral anticoagulation.   Ralene Bathe, PharmD, BCPS 04/25/2014, 10:09 AM  Pager: [phone]

## 2014-04-25 NOTE — Progress Notes (Signed)
[facility] PROGRESS NOTE  Assessment/Plan: Acute respiratory failure with hypoxia due to   COPD GOLD III and acute bronchitis: - Transition medications to oral. Start tapered steroid. - Cont Guaifenesin. - Cont Xopenex.   Essential Hypertension: - Continue current home meds.  Sinus tachycardia/ASCVD (arteriosclerotic cardiovascular disease)/  CAD (coronary artery disease): - 2-D echo grade 1 diastolic heart failure, with no wall motion abnormalities. - Continue Plavix. - Cardiology was consulted they recommended to start heparin and watch him for 24 hours.  Chronic lymphocytic leukemia  Hyperlipidemia: - Continue Lipitor.   Code Status: full Family Communication: Discussed with daughter at bedside.  Disposition Plan: Patient   Consultants:  None  Procedures:  Chest x-ray  Antibiotics:  Levaquin  HPI/Subjective: No complains wants to go home.  Objective: Filed Vitals:   04/24/14 1958 04/24/14 2139 04/25/14 0141 04/25/14 0543  BP:  146/69  154/62  Pulse:  70  67  Temp:  97.4 F (36.3 C)  97.5 F (36.4 C)  TempSrc:  Oral  Oral  Resp:  20  20  Height:      Weight:      SpO2: 95% 95% 95% 94%   No intake or output data in the 24 hours ending 04/25/14 0834 Filed Weights   04/21/14 1434  Weight: 76.204 kg (168 lb)    Exam:  General: Alert, awake, oriented x3, in no acute distress.  HEENT: No bruits, no goiter.  Heart: Regular rate and rhythm. Lungs: Moderate air movement, clear Abdomen: Soft, nontender, nondistended, positive bowel sounds.  Neuro: Grossly intact, nonfocal.   Data Reviewed: Basic Metabolic Panel:  Recent Labs Lab 04/21/14 1541 04/22/14 0515 04/24/14 0535  NA 140 139 133*  K 4.6 3.9 3.9  CL 103 101 98  CO2 27 28 30   GLUCOSE 110* 101* 151*  BUN 14 12 26*  CREATININE 0.96 0.97 1.12  CALCIUM 9.0 8.7 8.3*  MG 1.9  --   --    Liver Function Tests:  Recent Labs Lab 04/21/14 1541  AST 24  ALT 18  ALKPHOS 55    BILITOT 1.1  PROT 6.6  ALBUMIN 4.5   No results for input(s): LIPASE, AMYLASE in the last 168 hours. No results for input(s): AMMONIA in the last 168 hours. CBC:  Recent Labs Lab 04/21/14 1541 04/22/14 0515 04/24/14 0535 04/25/14 0511  WBC 11.2* 8.9 19.0* 24.1*  NEUTROABS 5.5  --   --   --   HGB 10.7* 10.4* 11.1* 12.3*  HCT 32.8* 31.4* 33.0* 37.1*  MCV 97.0 96.0 94.3 93.0  PLT 140* 133* 203 245   Cardiac Enzymes:  Recent Labs Lab 04/21/14 1541 04/21/14 2237 04/22/14 0515  TROPONINI 0.79* 0.79* 0.61*   BNP (last 3 results)  Recent Labs  04/21/14 1541  BNP 484.0*    ProBNP (last 3 results) No results for input(s): PROBNP in the last 8760 hours.  CBG: No results for input(s): GLUCAP in the last 168 hours.  No results found for this or any previous visit (from the past 240 hour(s)).   Studies: Dg Chest Port 1 View  04/23/2014   CLINICAL DATA:  Cough and wheezing.  EXAM: PORTABLE CHEST - 1 VIEW  COMPARISON:  04/21/2014  FINDINGS: Lungs are adequately inflated without consolidation or effusion. Cardiomediastinal silhouette is within normal. There is calcified plaque over the thoracic aorta. Remainder of the exam is unchanged.  IMPRESSION: No active disease.   Electronically Signed   By: Marin Olp M.D.  On: 04/23/2014 10:50    Scheduled Meds: . amLODipine  5 mg Oral Daily  . atorvastatin  10 mg Oral q1800  . budesonide-formoterol  2 puff Inhalation BID  . clopidogrel  75 mg Oral Daily  . furosemide  40 mg Oral Daily  . levalbuterol  0.63 mg Nebulization Q6H  . levofloxacin  500 mg Oral Daily  . losartan  50 mg Oral BID  . metoprolol tartrate  25 mg Oral BID  . montelukast  10 mg Oral QHS  . predniSONE  60 mg Oral Q breakfast  . tiotropium  18 mcg Inhalation Daily   Continuous Infusions: . heparin 1,100 Units/hr (04/25/14 0804)     Johnathan Strong  [facility] Pager [phone]. If 8PM-8AM, please contact night-coverage at [email or web address],  password [facility] 04/25/2014, 8:34 AM  LOS: 4 days

## 2014-04-25 NOTE — Progress Notes (Signed)
Patient Name: Johnathan Strong Date of Encounter: 04/25/2014     Principal Problem:   Acute respiratory failure with hypoxia Active Problems:   COPD GOLD III   Hypertension   Chronic lymphocytic leukemia   DOE (dyspnea on exertion)   CAD (coronary artery disease)   Hyperlipemia   ASCVD (arteriosclerotic cardiovascular disease)    SUBJECTIVE  The patient feels better but still has significant loose cough and audible rhonchi.  No chest pain or angina.  During this admission he was switched from carvedilol to metoprolol for a more selective beta blocker in view of his wheezing.  CURRENT MEDS . amLODipine  5 mg Oral Daily  . atorvastatin  10 mg Oral q1800  . budesonide-formoterol  2 puff Inhalation BID  . clopidogrel  75 mg Oral Daily  . furosemide  40 mg Oral Daily  . levalbuterol  0.63 mg Nebulization Q6H  . levofloxacin  500 mg Oral Daily  . losartan  50 mg Oral BID  . metoprolol tartrate  25 mg Oral BID  . montelukast  10 mg Oral QHS  . predniSONE  40 mg Oral Q breakfast  . tiotropium  18 mcg Inhalation Daily    OBJECTIVE  Filed Vitals:   04/24/14 2139 04/25/14 0141 04/25/14 0543 04/25/14 0847  BP: 146/69  154/62   Pulse: 70  67   Temp: 97.4 F (36.3 C)  97.5 F (36.4 C)   TempSrc: Oral  Oral   Resp: 20  20   Height:      Weight:      SpO2: 95% 95% 94% 92%   No intake or output data in the 24 hours ending 04/25/14 0940 Filed Weights   04/21/14 1434  Weight: 168 lb (76.204 kg)    PHYSICAL EXAM  General: Pleasant, NAD. Neuro: Alert and oriented X 3. Moves all extremities spontaneously. Psych: Normal affect. HEENT:  Normal  Neck: Supple without bruits or JVD. Lungs:  Moderate expiratory rhonchi bilaterally Heart: RRR no s3, s4, or murmurs. Abdomen: Soft, non-tender, non-distended, BS + x 4.  Extremities: No clubbing, cyanosis or edema. DP/PT/Radials 2+ and equal bilaterally.  Accessory Clinical Findings  CBC  Recent Labs  04/24/14 0535  04/25/14 0511  WBC 19.0* 24.1*  HGB 11.1* 12.3*  HCT 33.0* 37.1*  MCV 94.3 93.0  PLT 203 409   Basic Metabolic Panel  Recent Labs  04/24/14 0535  NA 133*  K 3.9  CL 98  CO2 30  GLUCOSE 151*  BUN 26*  CREATININE 1.12  CALCIUM 8.3*   Liver Function Tests No results for input(s): AST, ALT, ALKPHOS, BILITOT, PROT, ALBUMIN in the last 72 hours. No results for input(s): LIPASE, AMYLASE in the last 72 hours. Cardiac Enzymes No results for input(s): CKTOTAL, CKMB, CKMBINDEX, TROPONINI in the last 72 hours. BNP Invalid input(s): POCBNP D-Dimer No results for input(s): DDIMER in the last 72 hours. Hemoglobin A1C No results for input(s): HGBA1C in the last 72 hours. Fasting Lipid Panel No results for input(s): CHOL, HDL, LDLCALC, TRIG, CHOLHDL, LDLDIRECT in the last 72 hours. Thyroid Function Tests No results for input(s): TSH, T4TOTAL, T3FREE, THYROIDAB in the last 72 hours.  Invalid input(s): FREET3  TELE  Normal sinus rhythm    Radiology/Studies  X-ray Chest Pa And Lateral  04/21/2014   CLINICAL DATA:  Shortness of breath, hypoxia  EXAM: CHEST  2 VIEW  COMPARISON:  [place] PA and lateral chest radiographs 04/21/2014 at 12:30 p.m., available on [facility]  PACS  FINDINGS: The heart size and mediastinal contours are within normal limits. Both lungs are clear but hyperinflated suggesting emphysema. Left lower lobe calcified granuloma reidentified. The visualized skeletal structures are unremarkable.  IMPRESSION: No significant change since exam performed earlier today at [place]. Stable mild hyperinflation suggesting emphysema.   Electronically Signed   By: Conchita Paris M.D.   On: 04/21/2014 16:40   Dg Chest 2 View  04/21/2014   CLINICAL DATA:  Cough.  EXAM: CHEST  2 VIEW  COMPARISON:  03/10/2014.  02/26/2012.  FINDINGS: Mediastinum and hilar structures normal. Mild bibasilar pleural parenchymal thickening consistent with  scarring. No focal infiltrate. Calcified pulmonary nodule left lung base, stable. No pleural effusion or pneumothorax. No acute bony abnormality.  IMPRESSION: No acute cardiopulmonary disease.   Electronically Signed   By: Marcello Moores  Register   On: 04/21/2014 12:47   Dg Chest Port 1 View  04/23/2014   CLINICAL DATA:  Cough and wheezing.  EXAM: PORTABLE CHEST - 1 VIEW  COMPARISON:  04/21/2014  FINDINGS: Lungs are adequately inflated without consolidation or effusion. Cardiomediastinal silhouette is within normal. There is calcified plaque over the thoracic aorta. Remainder of the exam is unchanged.  IMPRESSION: No active disease.   Electronically Signed   By: Marin Olp M.D.   On: 04/23/2014 10:50    ASSESSMENT AND PLAN 1.  Acute COPD exacerbation.  Still wheezing moderately. 2.  Coronary artery disease status post stent for high-grade stenosis of the LAD in July 2014.  His presenting symptoms were dyspnea rather than chest pain.  The patient is not having any angina pectoris.  His troponin levels are flat and not suggestive of ACS.  Plan: Continue aggressive treatment of his COPD/bronchitis.   Signed, [name] MD

## 2014-04-25 NOTE — Progress Notes (Signed)
Patient with redness and white coating to tongue. Right aspect of lower lip with white coating. Pt reports feeling of soreness. MD made aware. Order given for IV diflucan. Vwilliams,rn.

## 2014-04-26 DIAGNOSIS — I4891 Unspecified atrial fibrillation: Secondary | ICD-10-CM | POA: Insufficient documentation

## 2014-04-26 LAB — CBC
HEMATOCRIT: 35.7 % — AB (ref 39.0–52.0)
Hemoglobin: 12.1 g/dL — ABNORMAL LOW (ref 13.0–17.0)
MCH: 31.8 pg (ref 26.0–34.0)
MCHC: 33.9 g/dL (ref 30.0–36.0)
MCV: 93.9 fL (ref 78.0–100.0)
Platelets: 201 10*3/uL (ref 150–400)
RBC: 3.8 MIL/uL — ABNORMAL LOW (ref 4.22–5.81)
RDW: 20.3 % — ABNORMAL HIGH (ref 11.5–15.5)
WBC: 18.1 10*3/uL — ABNORMAL HIGH (ref 4.0–10.5)

## 2014-04-26 LAB — TROPONIN I
TROPONIN I: 1.62 ng/mL — AB (ref <0.031)
Troponin I: 1.13 ng/mL (ref <0.031)
Troponin I: 1.45 ng/mL (ref <0.031)

## 2014-04-26 LAB — HEPARIN LEVEL (UNFRACTIONATED): HEPARIN UNFRACTIONATED: 0.34 [IU]/mL (ref 0.30–0.70)

## 2014-04-26 MED ORDER — DEXTROSE 5 % IV SOLN
5.0000 mg/h | INTRAVENOUS | Status: DC
  Administered 2014-04-26: 5 mg/h via INTRAVENOUS
  Filled 2014-04-26: qty 100

## 2014-04-26 MED ORDER — DILTIAZEM HCL 100 MG IV SOLR: 5.0000 mg/h | INTRAVENOUS | Status: DC

## 2014-04-26 MED ORDER — DILTIAZEM LOAD VIA INFUSION
10.0000 mg | Freq: Once | INTRAVENOUS | Status: DC
  Filled 2014-04-26: qty 10

## 2014-04-26 MED ORDER — METOPROLOL TARTRATE 1 MG/ML IV SOLN
5.0000 mg | INTRAVENOUS | Status: DC | PRN
  Filled 2014-04-26: qty 5

## 2014-04-26 MED ORDER — DILTIAZEM HCL 30 MG PO TABS
30.0000 mg | ORAL_TABLET | Freq: Four times a day (QID) | ORAL | Status: DC
  Administered 2014-04-26 – 2014-04-27 (×3): 30 mg via ORAL
  Filled 2014-04-26 (×3): qty 1

## 2014-04-26 MED ORDER — ALUM & MAG HYDROXIDE-SIMETH 200-200-20 MG/5ML PO SUSP
30.0000 mL | Freq: Once | ORAL | Status: AC
  Administered 2014-04-26: 30 mL via ORAL
  Filled 2014-04-26: qty 30

## 2014-04-26 MED ORDER — SALINE SPRAY 0.65 % NA SOLN
1.0000 | NASAL | Status: DC | PRN
  Administered 2014-04-26: 1 via NASAL
  Filled 2014-04-26: qty 44

## 2014-04-26 MED ORDER — HEPARIN (PORCINE) IN NACL 100-0.45 UNIT/ML-% IJ SOLN
1100.0000 [IU]/h | INTRAMUSCULAR | Status: DC
  Administered 2014-04-26 (×2): 1100 [IU]/h via INTRAVENOUS
  Filled 2014-04-26 (×2): qty 250

## 2014-04-26 MED ORDER — DILTIAZEM LOAD VIA INFUSION
10.0000 mg | Freq: Once | INTRAVENOUS | Status: AC
  Administered 2014-04-26: 10 mg via INTRAVENOUS

## 2014-04-26 MED ORDER — PREDNISONE 5 MG PO TABS
30.0000 mg | ORAL_TABLET | Freq: Every day | ORAL | Status: DC
  Administered 2014-04-27: 30 mg via ORAL
  Filled 2014-04-26: qty 1
  Filled 2014-04-26: qty 2

## 2014-04-26 MED ORDER — DILTIAZEM HCL 100 MG IV SOLR
5.0000 mg/h | INTRAVENOUS | Status: AC
  Administered 2014-04-26: 5 mg/h via INTRAVENOUS
  Filled 2014-04-26: qty 100

## 2014-04-26 MED ORDER — PREDNISONE 20 MG PO TABS
40.0000 mg | ORAL_TABLET | Freq: Once | ORAL | Status: AC
  Administered 2014-04-26: 40 mg via ORAL

## 2014-04-26 NOTE — Progress Notes (Signed)
This shift RN notified of pt in AFIB and sustaining heart rate 130-140's. EKG done, pt asymptomatic and attending notified. New orders from Dr. Acie Fredrickson  For 5 mg metoprolol. At the time of this dose pt became very tearful and anxious while discussing deceased wife.  Pt then  c/o chest tightening.  Rapid response and attending called for further pt evaluation. HR 118 BP  150/86

## 2014-04-26 NOTE — Progress Notes (Signed)
[facility] progress note. Chief complaint. Tachycardia, chest tightness. History of present illness. This 81 year old male in hospital with acute respiratory failure and hypoxia thought secondary to acute bronchitis. Patient has baseline COPD. Patient has a known history of sinus tachycardia and atherosclerotic cardiovascular disease. Patient had an incident of atrial fib earlier this hospitalization but converted back to normal sinus rhythm. Patient was noted by nursing staff to have that tachycardia. A 12-lead EKG was requested and this resulted in a indeterminate and rhythm with ST depression in V4 through 6. Nursing contacted Dr. Mare Ferrari cardiology who has already been consulted on this case. He requested nursing administer the when necessary Lopressor already ordered for heart rate greater than 100. Following administration of IV Lopressor patient complained of chest tightness. I came to the bedside along with rapid response. Vital signs. Temperature 98.2, pulse 130s, respiration 18, blood pressure 150/90. O2 sats 95%. General appearance. Well-developed elderly male who is alert and in no distress. Cardiac. Irregular and tachycardic. Lungs. Diminished breath sounds throughout. Abdomen. Soft with positive bowel sounds. Impression/plan. Problem #1. Chest tightness. Patient indicating that this symptom has self resolved. EKG does show some ST depression V4-6. This may represent some demand ischemia. We'll request troponin now and then every 6 hours for total of 3 sets. Further follow-up as per cardiology. Problem #2. Tachycardia. Though EKG suggests indeterminate and rhythm this appears to me to be probable atrial fib. The patient has not yet responded to the 5 mg metoprolol given as far as rate control. Cardiology will be notified and further management as per consulting cardiologist.

## 2014-04-26 NOTE — Progress Notes (Addendum)
[facility] PROGRESS NOTE  Assessment/Plan: Acute respiratory failure with hypoxia due to   COPD GOLD III and acute bronchitis: - Transition medications to oral. Start tapered steroid. - Cont Guaifenesin. - Cont Xopenex.   Essential Hypertension: - Continue current home meds.  A. Fib with RVR/ASCVD (arteriosclerotic cardiovascular disease)/  CAD (coronary artery disease): - 2-D echo grade 1 diastolic heart failure, with no wall motion abnormalities. - Continue Plavix. - Cardiology consulted rec diltiazem ggt and heparin drip and Eluquis as an outpatient.  Chronic lymphocytic leukemia  Hyperlipidemia: - Continue Lipitor.  Constipation: - Miralax.  Code Status: full Family Communication: Discussed with daughter at bedside.  Disposition Plan: Patient   Consultants:  None  Procedures:  Chest x-ray  Antibiotics:  Levaquin  HPI/Subjective: No complains  Objective: Filed Vitals:   04/26/14 0526 04/26/14 0535 04/26/14 0815 04/26/14 0824  BP: 166/152 150/90  115/69  Pulse: 139   107  Temp: 98.2 F (36.8 C)     TempSrc: Oral     Resp: 18   18  Height:      Weight:      SpO2: 95%  97%     Intake/Output Summary (Last 24 hours) at 04/26/14 0843 Last data filed at 04/25/14 1407  Gross per 24 hour  Intake    480 ml  Output      0 ml  Net    480 ml   Filed Weights   04/21/14 1434 04/25/14 1100  Weight: 76.204 kg (168 lb) 73.1 kg (161 lb 2.5 oz)    Exam:  General: Alert, awake, oriented x3, in no acute distress.  HEENT: No bruits, no goiter.  Heart: Regular rate and rhythm. Lungs: Moderate air movement, clear Abdomen: Soft, nontender, nondistended, positive bowel sounds.  Neuro: Grossly intact, nonfocal.   Data Reviewed: Basic Metabolic Panel:  Recent Labs Lab 04/21/14 1541 04/22/14 0515 04/24/14 0535  NA 140 139 133*  K 4.6 3.9 3.9  CL 103 101 98  CO2 27 28 30   GLUCOSE 110* 101* 151*  BUN 14 12 26*  CREATININE 0.96 0.97 1.12    CALCIUM 9.0 8.7 8.3*  MG 1.9  --   --    Liver Function Tests:  Recent Labs Lab 04/21/14 1541  AST 24  ALT 18  ALKPHOS 55  BILITOT 1.1  PROT 6.6  ALBUMIN 4.5   No results for input(s): LIPASE, AMYLASE in the last 168 hours. No results for input(s): AMMONIA in the last 168 hours. CBC:  Recent Labs Lab 04/21/14 1541 04/22/14 0515 04/24/14 0535 04/25/14 0511 04/26/14 0512  WBC 11.2* 8.9 19.0* 24.1* 18.1*  NEUTROABS 5.5  --   --   --   --   HGB 10.7* 10.4* 11.1* 12.3* 12.1*  HCT 32.8* 31.4* 33.0* 37.1* 35.7*  MCV 97.0 96.0 94.3 93.0 93.9  PLT 140* 133* 203 245 201   Cardiac Enzymes:  Recent Labs Lab 04/21/14 1541 04/21/14 2237 04/22/14 0515 04/26/14 0512  TROPONINI 0.79* 0.79* 0.61* 1.13*   BNP (last 3 results)  Recent Labs  04/21/14 1541  BNP 484.0*    ProBNP (last 3 results) No results for input(s): PROBNP in the last 8760 hours.  CBG: No results for input(s): GLUCAP in the last 168 hours.  No results found for this or any previous visit (from the past 240 hour(s)).   Studies: No results found.  Scheduled Meds: . amLODipine  5 mg Oral Daily  . antiseptic oral rinse  7 mL Mouth  Rinse q12n4p  . atorvastatin  10 mg Oral q1800  . budesonide-formoterol  2 puff Inhalation BID  . chlorhexidine  15 mL Mouth Rinse BID  . clopidogrel  75 mg Oral Daily  . fluconazole (DIFLUCAN) IV  100 mg Intravenous Q24H  . furosemide  40 mg Oral Daily  . levalbuterol  0.63 mg Nebulization Q6H  . levofloxacin  500 mg Oral Daily  . losartan  50 mg Oral BID  . metoprolol tartrate  25 mg Oral BID  . montelukast  10 mg Oral QHS  . predniSONE  40 mg Oral Q breakfast  . tiotropium  18 mcg Inhalation Daily   Continuous Infusions: . diltiazem (CARDIZEM) infusion 5 mg/hr (04/26/14 0821)  . heparin 1,100 Units/hr (04/26/14 0800)     Charlynne Cousins  [facility] Pager [phone]. If 7PM-7AM, please contact night-coverage at [email or web address], password  [facility] 04/26/2014, 8:43 AM  LOS: 5 days

## 2014-04-26 NOTE — Progress Notes (Signed)
Patient Name: Johnathan Strong Date of Encounter: 04/26/2014     Principal Problem:   Acute respiratory failure with hypoxia Active Problems:   COPD GOLD III   Hypertension   Chronic lymphocytic leukemia   DOE (dyspnea on exertion)   CAD (coronary artery disease)   Hyperlipemia   ASCVD (arteriosclerotic cardiovascular disease)    SUBJECTIVE  The patient went back into atrial fibrillation with rapid ventricular response yesterday evening.  EKG done yesterday at 1738 shows atrial fibrillation with rapid ventricular response.  He has been placed back on IV heparin.  He is not having any chest discomfort.  He himself is not aware of his heart rate and thought that it was about 60.  He may have been having previous episodes of atrial fibrillation and not realizing it.  When he was admitted to the hospital he was in normal sinus rhythm but the next day did have atrial fibrillation rapid ventricular response which then reverted back to normal sinus rhythm.  CURRENT MEDS . amLODipine  5 mg Oral Daily  . antiseptic oral rinse  7 mL Mouth Rinse q12n4p  . atorvastatin  10 mg Oral q1800  . budesonide-formoterol  2 puff Inhalation BID  . chlorhexidine  15 mL Mouth Rinse BID  . clopidogrel  75 mg Oral Daily  . fluconazole (DIFLUCAN) IV  100 mg Intravenous Q24H  . furosemide  40 mg Oral Daily  . levalbuterol  0.63 mg Nebulization Q6H  . levofloxacin  500 mg Oral Daily  . losartan  50 mg Oral BID  . metoprolol tartrate  25 mg Oral BID  . montelukast  10 mg Oral QHS  . predniSONE  40 mg Oral Q breakfast  . tiotropium  18 mcg Inhalation Daily    OBJECTIVE  Filed Vitals:   04/25/14 2123 04/26/14 0255 04/26/14 0526 04/26/14 0535  BP: 150/47  166/152 150/90  Pulse: 81  139   Temp: 97.5 F (36.4 C)  98.2 F (36.8 C)   TempSrc: Oral  Oral   Resp: 16  18   Height:      Weight:      SpO2: 90% 93% 95%     Intake/Output Summary (Last 24 hours) at 04/26/14 0804 Last data filed at  04/25/14 1407  Gross per 24 hour  Intake    480 ml  Output      0 ml  Net    480 ml   Filed Weights   04/21/14 1434 04/25/14 1100  Weight: 168 lb (76.204 kg) 161 lb 2.5 oz (73.1 kg)    PHYSICAL EXAM  General: Pleasant, NAD. Neuro: Alert and oriented X 3. Moves all extremities spontaneously. Psych: Normal affect. HEENT:  Normal  Neck: Supple without bruits or JVD. Lungs:  Resp regular and unlabored, no rales.  Minimal expiratory wheeze. Heart: Rapid irregular heart rate.  no s3, s4, or murmurs. Abdomen: Soft, non-tender, non-distended, BS + x 4.  Extremities: No clubbing, cyanosis or edema. DP/PT/Radials 2+ and equal bilaterally.  Accessory Clinical Findings  CBC  Recent Labs  04/25/14 0511 04/26/14 0512  WBC 24.1* 18.1*  HGB 12.3* 12.1*  HCT 37.1* 35.7*  MCV 93.0 93.9  PLT 245 761   Basic Metabolic Panel  Recent Labs  04/24/14 0535  NA 133*  K 3.9  CL 98  CO2 30  GLUCOSE 151*  BUN 26*  CREATININE 1.12  CALCIUM 8.3*   Liver Function Tests No results for input(s): AST, ALT, ALKPHOS, BILITOT, PROT, ALBUMIN  in the last 72 hours. No results for input(s): LIPASE, AMYLASE in the last 72 hours. Cardiac Enzymes  Recent Labs  04/26/14 0512  TROPONINI 1.13*   BNP Invalid input(s): POCBNP D-Dimer No results for input(s): DDIMER in the last 72 hours. Hemoglobin A1C No results for input(s): HGBA1C in the last 72 hours. Fasting Lipid Panel No results for input(s): CHOL, HDL, LDLCALC, TRIG, CHOLHDL, LDLDIRECT in the last 72 hours. Thyroid Function Tests No results for input(s): TSH, T4TOTAL, T3FREE, THYROIDAB in the last 72 hours.  Invalid input(s): FREET3  TELE  Atrial fibrillation with rapid ventricular response.  ECG  Atrial fibrillation with rapid ventricular response.  Nonspecific ST-T wave changes.  Possible inferolateral ischemia.  Personally interpreted.  Radiology/Studies  X-ray Chest Pa And Lateral  04/21/2014   CLINICAL DATA:  Shortness  of breath, hypoxia  EXAM: CHEST  2 VIEW  COMPARISON:  [place] PA and lateral chest radiographs 04/21/2014 at 12:30 p.m., available on [facility] PACS  FINDINGS: The heart size and mediastinal contours are within normal limits. Both lungs are clear but hyperinflated suggesting emphysema. Left lower lobe calcified granuloma reidentified. The visualized skeletal structures are unremarkable.  IMPRESSION: No significant change since exam performed earlier today at [place]. Stable mild hyperinflation suggesting emphysema.   Electronically Signed   By: Conchita Paris M.D.   On: 04/21/2014 16:40   Dg Chest 2 View  04/21/2014   CLINICAL DATA:  Cough.  EXAM: CHEST  2 VIEW  COMPARISON:  03/10/2014.  02/26/2012.  FINDINGS: Mediastinum and hilar structures normal. Mild bibasilar pleural parenchymal thickening consistent with scarring. No focal infiltrate. Calcified pulmonary nodule left lung base, stable. No pleural effusion or pneumothorax. No acute bony abnormality.  IMPRESSION: No acute cardiopulmonary disease.   Electronically Signed   By: Marcello Moores  Register   On: 04/21/2014 12:47   Dg Chest Port 1 View  04/23/2014   CLINICAL DATA:  Cough and wheezing.  EXAM: PORTABLE CHEST - 1 VIEW  COMPARISON:  04/21/2014  FINDINGS: Lungs are adequately inflated without consolidation or effusion. Cardiomediastinal silhouette is within normal. There is calcified plaque over the thoracic aorta. Remainder of the exam is unchanged.  IMPRESSION: No active disease.   Electronically Signed   By: Marin Olp M.D.   On: 04/23/2014 10:50    ASSESSMENT AND PLAN  1. Acute COPD exacerbation. Respiratory status is improved.  He has minimal cough now and minimal sputum production.  Leukocytosis secondary to recent steroids. 2. Coronary artery disease status post stent for high-grade stenosis of the LAD in July 2014. His presenting symptoms were dyspnea rather than chest pain. The patient  is not having any angina pectoris. His troponin levels are flat and not suggestive of ACS. 3.  Paroxysmal atrial fibrillation, recurrent.  Plan: IV heparin has been restarted.  We will transfer to telemetry floor and start IV Cardizem for rate control.  Chads Vasc score is 4.  He will need long-term anticoagulation.  He may be a candidate for Eliquis.  He is a retired Music therapist.  At this point I would also continue his Plavix.  He is allergic to aspirin Signed, [name] MD

## 2014-04-26 NOTE — Significant Event (Signed)
Rapid Response Event Note  Overview: Time Called: 0625 Arrival Time: 0635 Event Type: Cardiac  Initial Focused Assessment: Patient c/o chest tightness after receiving lopressor 5mg  IV. He had converted to A-FIb RVR. Chest discomfort resolved with no intervention except maalox.    Interventions: Labs ordered, maalox given, Heparin and cardizem started.    Event Summary: Name of Physician Notified: Kathline Magic NP  at [phone]  Name of Consulting Physician Notified: Dr. Mare Ferrari   at 0700 and Angelena Form PA  Outcome: Transferred (Comment) [phone])  Event End Time: 9311  Pricilla Riffle

## 2014-04-26 NOTE — Progress Notes (Signed)
ANTICOAGULATION CONSULT NOTE - Initial Consult  Pharmacy Consult for Heparin Indication: atrial fibrillation  Allergies  Allergen Reactions  . Aspirin Anaphylaxis, Shortness Of Breath and Swelling  . Nsaids Anaphylaxis, Shortness Of Breath and Swelling  . Septra [Sulfamethoxazole-Trimethoprim] Rash  . Benicar [Olmesartan Medoxomil]     dizzy  . Salicylates     SOB  . Ampicillin Rash    Patient Measurements: Height: 5\' 8"  (172.7 cm) Weight: 161 lb 2.5 oz (73.1 kg) IBW/kg (Calculated) : 68.4 Heparin Dosing Weight:   Vital Signs: Temp: 98.2 F (36.8 C) (02/21 0526) Temp Source: Oral (02/21 0526) BP: 150/90 mmHg (02/21 0535) Pulse Rate: 139 (02/21 0526)  Labs:  Recent Labs  04/23/14 1415  04/24/14 0535 04/24/14 0841 04/24/14 1820 04/25/14 0511 04/26/14 0512  HGB  --   < > 11.1*  --   --  12.3* 12.1*  HCT  --   --  33.0*  --   --  37.1* 35.7*  PLT  --   --  203  --   --  245 201  APTT 30  --   --   --   --   --   --   LABPROT 15.1  --   --   --   --   --   --   INR 1.17  --   --   --   --   --   --   HEPARINUNFRC  --   < >  --  0.75* 0.42 0.47  --   CREATININE  --   --  1.12  --   --   --   --   < > = values in this interval not displayed.  Estimated Creatinine Clearance: 43.3 mL/min (by C-G formula based on Cr of 1.12).   Medications:  Infusions:     Assessment: 88 yoM with PMHx CAD with total occlusion of RCA and s/p stent to LAD 7/'14 and COPD gold III presents with SOB likely 2/2 AECOPD as well as possible AECHF.  Started on IV heparin for brief episode atrial fibrillation on 2/18. Cardiology consulted, note troponin levels flat, not suggestive of ACS and heparin d/c'd on 2/20.  Next day, pt experienced tachycardia with chest tightness and IV heparin re-started.     2/21:  Hgb low, stable, plts WNL.  Renal fxn stable. Troponins x 3 ordered.   Goal of Therapy:  Heparin level 0.3-0.7 units/ml Monitor platelets by anticoagulation protocol: Yes   Plan:   Restart IV heparin at previous rate 1100 units/hr = 11 ml/hr.  No bolus 2/2 age/low hgb.  F/u 8 hour heparin level.   Ralene Bathe, PharmD, BCPS 04/26/2014, 7:42 AM  Pager: [phone]

## 2014-04-26 NOTE — Progress Notes (Signed)
Pt transferred to 1431. Malachy Mood, RRRN, along with Colletta Maryland from ICU transferred pt to 4th floor.  Pt left unit on bed pushed by these nurses. Left in stable condition. Vwilliams,rn.

## 2014-04-26 NOTE — Progress Notes (Signed)
ANTICOAGULATION CONSULT NOTE - Initial Consult  Pharmacy Consult for Heparin Indication: atrial fibrillation  Allergies  Allergen Reactions  . Aspirin Anaphylaxis, Shortness Of Breath and Swelling  . Nsaids Anaphylaxis, Shortness Of Breath and Swelling  . Septra [Sulfamethoxazole-Trimethoprim] Rash  . Benicar [Olmesartan Medoxomil]     dizzy  . Salicylates     SOB  . Ampicillin Rash    Patient Measurements: Height: 5\' 8"  (172.7 cm) Weight: 161 lb 2.5 oz (73.1 kg) IBW/kg (Calculated) : 68.4 Heparin Dosing Weight:   Vital Signs: Temp: 98.2 F (36.8 C) (02/21 1418) Temp Source: Oral (02/21 1418) BP: 108/60 mmHg (02/21 1418) Pulse Rate: 114 (02/21 1418)  Labs:  Recent Labs  04/24/14 0535  04/24/14 1820 04/25/14 0511 04/26/14 0512 04/26/14 1154 04/26/14 1600  HGB 11.1*  --   --  12.3* 12.1*  --   --   HCT 33.0*  --   --  37.1* 35.7*  --   --   PLT 203  --   --  245 201  --   --   HEPARINUNFRC  --   < > 0.42 0.47  --   --  0.34  CREATININE 1.12  --   --   --   --   --   --   TROPONINI  --   --   --   --  1.13* 1.62* 1.45*  < > = values in this interval not displayed.  Estimated Creatinine Clearance: 43.3 mL/min (by C-G formula based on Cr of 1.12).   Medications:  Infusions:  . diltiazem (CARDIZEM) infusion    . heparin 1,100 Units/hr (04/26/14 0800)    Assessment: 21 yoM with PMHx CAD with total occlusion of RCA and s/p stent to LAD 7/'14 and COPD gold III presents with SOB likely 2/2 AECOPD as well as possible AECHF.  Started on IV heparin for brief episode atrial fibrillation on 2/18. Cardiology consulted, note troponin levels flat, not suggestive of ACS and heparin d/c'd on 2/20.  Next day, pt experienced tachycardia with chest tightness and IV heparin re-started.     2/21:  Hgb low, stable, plts WNL.  Renal fxn stable. Troponins x 3 ordered.   Goal of Therapy:  Heparin level 0.3-0.7 units/ml Monitor platelets by anticoagulation protocol: Yes   Plan:   Restart IV heparin at previous rate 1100 units/hr = 11 ml/hr.  No bolus 2/2 age/low hgb.  F/u 8 hour heparin level.   Ralene Bathe, PharmD, BCPS 04/26/2014, 5:00 PM  Pager: [phone]  Update: 1st level therapeutic, at low end of range.  No issues per RN. Will continue gtt at current rate and recheck in 8 hours.    Ralene Bathe, PharmD, BCPS 04/26/2014, 5:03 PM  Pager: [phone]

## 2014-04-27 ENCOUNTER — Ambulatory Visit: Payer: Medicare Other

## 2014-04-27 DIAGNOSIS — C9100 Acute lymphoblastic leukemia not having achieved remission: Secondary | ICD-10-CM

## 2014-04-27 LAB — CBC
HCT: 35.3 % — ABNORMAL LOW (ref 39.0–52.0)
Hemoglobin: 11.7 g/dL — ABNORMAL LOW (ref 13.0–17.0)
MCH: 31.1 pg (ref 26.0–34.0)
MCHC: 33.1 g/dL (ref 30.0–36.0)
MCV: 93.9 fL (ref 78.0–100.0)
Platelets: 196 K/uL (ref 150–400)
RBC: 3.76 MIL/uL — ABNORMAL LOW (ref 4.22–5.81)
RDW: 20.3 % — ABNORMAL HIGH (ref 11.5–15.5)
WBC: 15.9 K/uL — ABNORMAL HIGH (ref 4.0–10.5)

## 2014-04-27 LAB — HEPARIN LEVEL (UNFRACTIONATED): Heparin Unfractionated: 0.44 [IU]/mL (ref 0.30–0.70)

## 2014-04-27 MED ORDER — PREDNISONE 5 MG PO TABS: ORAL_TABLET | ORAL | Status: DC

## 2014-04-27 MED ORDER — DILTIAZEM HCL ER COATED BEADS 120 MG PO CP24
120.0000 mg | ORAL_CAPSULE | Freq: Every day | ORAL | Status: DC
  Administered 2014-04-27: 120 mg via ORAL
  Filled 2014-04-27: qty 1

## 2014-04-27 MED ORDER — APIXABAN 2.5 MG PO TABS
5.0000 mg | ORAL_TABLET | Freq: Two times a day (BID) | ORAL | Status: DC
  Administered 2014-04-27: 5 mg via ORAL
  Filled 2014-04-27: qty 2

## 2014-04-27 MED ORDER — LEVOFLOXACIN 500 MG PO TABS: 500.0000 mg | ORAL_TABLET | Freq: Every day | ORAL | Status: DC

## 2014-04-27 MED ORDER — APIXABAN 5 MG PO TABS: 5.0000 mg | ORAL_TABLET | Freq: Two times a day (BID) | ORAL | Status: DC

## 2014-04-27 MED ORDER — METOPROLOL TARTRATE 25 MG PO TABS: 25.0000 mg | ORAL_TABLET | Freq: Two times a day (BID) | ORAL | Status: DC

## 2014-04-27 MED ORDER — DILTIAZEM HCL ER COATED BEADS 120 MG PO CP24: 120.0000 mg | ORAL_CAPSULE | Freq: Every day | ORAL | Status: DC

## 2014-04-27 MED ORDER — LEVALBUTEROL HCL 0.63 MG/3ML IN NEBU: 0.6300 mg | INHALATION_SOLUTION | Freq: Once | RESPIRATORY_TRACT | Status: DC

## 2014-04-27 NOTE — Progress Notes (Signed)
ANTICOAGULATION CONSULT NOTE - Follow Up Consult  Pharmacy Consult for Heparin Indication: atrial fibrillation  Allergies  Allergen Reactions  . Aspirin Anaphylaxis, Shortness Of Breath and Swelling  . Nsaids Anaphylaxis, Shortness Of Breath and Swelling  . Septra [Sulfamethoxazole-Trimethoprim] Rash  . Benicar [Olmesartan Medoxomil]     dizzy  . Salicylates     SOB  . Ampicillin Rash    Patient Measurements: Height: 5\' 8"  (172.7 cm) Weight: 161 lb 2.5 oz (73.1 kg) IBW/kg (Calculated) : 68.4 Heparin Dosing Weight:   Vital Signs: Temp: 97.7 F (36.5 C) (02/21 2200) Temp Source: Oral (02/21 2200) BP: 131/57 mmHg (02/21 2200) Pulse Rate: 56 (02/21 2200)  Labs:  Recent Labs  04/24/14 0535  04/25/14 0511 04/26/14 0512 04/26/14 1154 04/26/14 1600 04/27/14 0010  HGB 11.1*  --  12.3* 12.1*  --   --  11.7*  HCT 33.0*  --  37.1* 35.7*  --   --  35.3*  PLT 203  --  245 201  --   --  196  HEPARINUNFRC  --   < > 0.47  --   --  0.34 0.44  CREATININE 1.12  --   --   --   --   --   --   TROPONINI  --   --   --  1.13* 1.62* 1.45*  --   < > = values in this interval not displayed.  Estimated Creatinine Clearance: 43.3 mL/min (by C-G formula based on Cr of 1.12).   Medications:  Infusions:  . heparin 1,100 Units/hr (04/26/14 2308)    Assessment: Patient with heparin level at goal.  No issues note by RN.  Goal of Therapy:  Heparin level 0.3-0.7 units/ml Monitor platelets by anticoagulation protocol: Yes   Plan:  Continue heparin drip at current rate Recheck level at 0800  Tyler Deis, Shea Stakes Crowford 04/27/2014,2:32 AM

## 2014-04-27 NOTE — Discharge Summary (Signed)
Physician Discharge Summary  Johnathan Strong:[id number] DOB: 1923/07/28 DOA: 04/21/2014  PCP: Johnathan Gainer, MD  Admit date: 04/21/2014 Discharge date: 04/27/2014  Time spent: 30 minutes  Recommendations for Outpatient Follow-up:  1. Follow up with Cardiology as an outpatient  Discharge Diagnoses:  Principal Problem:   Acute respiratory failure with hypoxia Active Problems:   COPD GOLD III   Hypertension   Atrial fibrillation with RVR   Chronic lymphocytic leukemia   DOE (dyspnea on exertion)   CAD (coronary artery disease)   Hyperlipemia   ASCVD (arteriosclerotic cardiovascular disease)   Discharge Condition: stable  Diet recommendation: heart healthy  Filed Weights   04/25/14 1100 04/26/14 1418 04/27/14 0444  Weight: 73.1 kg (161 lb 2.5 oz) 73.1 kg (161 lb 2.5 oz) 70.6 kg (155 lb 10.3 oz)    History of present illness:  81 y.o. male has a past medical history significant for coronary artery disease followed by Dr. Mare Strong, CLL, hypertension, hyperlipidemia, presents to the emergency room from [name] that since Sunday he felt like he was coming down with a cold, and this progressed yesterday with worsening dyspnea on exertion, and the cough has been more productive. He started taking doxycycline as well as his home inhalers however he did not feel any improvement, and he presented to Dr. Laurance Strong which is his primary care doctor today, he was found to be hypoxic and thus referred for admission. Patient denies any chest pain, endorses mild chest discomfort when he is coughing, he denies any abdominal pain nausea or vomiting, he denies any lightheadedness or dizziness. He denies any diarrhea. He has no palpitations. He denies any fevers. Per his wife, her latest January they had a trip in [place] where he had a "bad respiratory infection" and was on antibiotics.    Hospital Course:  Acute  respiratory failure with hypoxia due to COPD GOLD III and acute bronchitis: - She was started on IV steroids, and antibiotics and inhalers. - The albuterol had to be changed to Xopenex as it was causing him to be tachycardic. - His antibiotic and steroids were changed to orals which she will continue at home.  Essential Hypertension: - His Pneumovax and Coreg were DC. - Due to his atrial fibrillation who was changed to metoprolol and diltiazem.  Paroxysmal atrial fibrillation with RVR/ASCVD (arteriosclerotic cardiovascular disease)/ CAD (coronary artery disease): - 2-D echo grade 1 diastolic heart failure, with no wall motion abnormalities. - Cardiology was consulted they recommended to DC the Coreg and Norvasc. He was started on IV diltiazem drip to and IV heparin. - Within 24 hours he heart rate was controlled so he was transitioned to oral. - He will be discharged home on metoprolol and diltiazem which has kept his repeat heart rate between 60 and 75. - His heparin was stopped and he was started on a Eliquis. - HR sinus rhythm.  Chronic lymphocytic leukemia  Hyperlipidemia: - Continue Lipitor.   Procedures:  CXR  Consultations:  Cardiology  Discharge Exam: Filed Vitals:   04/27/14 1014  BP: 127/44  Pulse:   Temp:   Resp:     General: A&o x3 Cardiovascular: RRR Respiratory: good air movement CTA B/L  Discharge Instructions   Discharge Instructions    Diet - low sodium heart healthy    Complete by:  As directed      Increase activity slowly    Complete by:  As directed  Current Discharge Medication List    START taking these medications   Details  apixaban (ELIQUIS) 5 MG TABS tablet Take 1 tablet (5 mg total) by mouth 2 (two) times daily. Qty: 60 tablet, Refills: 0    diltiazem (CARDIZEM CD) 120 MG 24 hr capsule Take 1 capsule (120 mg total) by mouth daily. Qty: 30 capsule, Refills: 3    levalbuterol (XOPENEX) 0.63 MG/3ML nebulizer  solution Take 3 mLs (0.63 mg total) by nebulization once. Qty: 3 mL, Refills: 12    levofloxacin (LEVAQUIN) 500 MG tablet Take 1 tablet (500 mg total) by mouth daily. Qty: 1 tablet, Refills: 0    metoprolol tartrate (LOPRESSOR) 25 MG tablet Take 1 tablet (25 mg total) by mouth 2 (two) times daily. Qty: 60 tablet, Refills: 0    predniSONE (DELTASONE) 5 MG tablet Take 3 tabs for 1 day then 2 tabs for 1 day then 1 tab for 1 day. Qty: 6 tablet, Refills: 0      CONTINUE these medications which have NOT CHANGED   Details  albuterol (VENTOLIN HFA) 108 (90 BASE) MCG/ACT inhaler Inhale 2 puffs into the lungs every 6 (six) hours as needed for wheezing.     Azelastine HCl (ASTELIN NA) Place 1 spray into the nose as needed (for congestion).     Cholecalciferol (VITAMIN D3) 1000 UNITS CAPS Take 1 capsule by mouth daily.     cimetidine (TAGAMET) 300 MG tablet TAKE 1 TABLET UP TO 4 TIMES A DAY Qty: 40 tablet, Refills: 11    clopidogrel (PLAVIX) 75 MG tablet TAKE 1 TABLET ONCE A DAY Qty: 90 tablet, Refills: 1    DEPO-TESTOSTERONE 200 MG/ML injection INJECT 1 ML INTRAMUSCULARLY MONTHLY Qty: 3 mL, Refills: 1    diphenhydramine-acetaminophen (TYLENOL PM) 25-500 MG TABS Take 1 tablet by mouth at bedtime as needed. sleep    fluticasone (FLONASE) 50 MCG/ACT nasal spray 2 SPRAYS INTO EACH NOSTRIL ONCE A DAY Qty: 16 g, Refills: 3    furosemide (LASIX) 20 MG tablet TAKE (1) TABLET DAILY AS DIRECTED. Qty: 30 tablet, Refills: 2    LIPITOR 10 MG tablet TAKE 1 TABLET DAILY Qty: 90 tablet, Refills: 1    losartan (COZAAR) 50 MG tablet TAKE (1) TABLET TWICE A DAY. Qty: 180 tablet, Refills: 1    montelukast (SINGULAIR) 10 MG tablet TAKE ONE TABLET AT BEDTIME Qty: 90 tablet, Refills: 2    nitroGLYCERIN (NITROSTAT) 0.4 MG SL tablet Place 1 tablet (0.4 mg total) under the tongue every 5 (five) minutes as needed for chest pain. Qty: 25 tablet, Refills: 3    Omega-3 Fatty Acids (FISH OIL) 1200 MG CAPS  Take 1 capsule by mouth daily.     SYMBICORT 160-4.5 MCG/ACT inhaler 2 PUFFS EVERY 12 HOURS Qty: 10.2 g, Refills: 1    tiotropium (SPIRIVA HANDIHALER) 18 MCG inhalation capsule INHALE THE CONTENTS OF ONE CAPSULE ONCE DAILY AS DIRECTED Qty: 30 capsule, Refills: 3    traMADol (ULTRAM) 50 MG tablet TAKE 1 TABLET UP TO 2 TIMES A DAY Qty: 60 tablet, Refills: 0    triamcinolone cream (KENALOG) 0.1 % APPLY TO AFFECTED AREAS 2 TIMES A DAY AS NEEDED Qty: 45 g, Refills: 3      STOP taking these medications     carvedilol (COREG) 12.5 MG tablet      doxycycline (VIBRAMYCIN) 100 MG capsule      NORVASC 5 MG tablet        Allergies  Allergen Reactions  . Aspirin Anaphylaxis,  Shortness Of Breath and Swelling  . Nsaids Anaphylaxis, Shortness Of Breath and Swelling  . Septra [Sulfamethoxazole-Trimethoprim] Rash  . Benicar [Olmesartan Medoxomil]     dizzy  . Salicylates     SOB  . Ampicillin Rash      The results of significant diagnostics from this hospitalization (including imaging, microbiology, ancillary and laboratory) are listed below for reference.    Significant Diagnostic Studies: X-ray Chest Pa And Lateral  04/21/2014   CLINICAL DATA:  Shortness of breath, hypoxia  EXAM: CHEST  2 VIEW  COMPARISON:  [facility] PA and lateral chest radiographs 04/21/2014 at 12:30 p.m., available on [facility] PACS  FINDINGS: The heart size and mediastinal contours are within normal limits. Both lungs are clear but hyperinflated suggesting emphysema. Left lower lobe calcified granuloma reidentified. The visualized skeletal structures are unremarkable.  IMPRESSION: No significant change since exam performed earlier today at [facility]. Stable mild hyperinflation suggesting emphysema.   Electronically Signed   By: Conchita Paris M.D.   On: 04/21/2014 16:40   Dg Chest 2 View  04/21/2014   CLINICAL DATA:  Cough.  EXAM: CHEST  2 VIEW  COMPARISON:   03/10/2014.  02/26/2012.  FINDINGS: Mediastinum and hilar structures normal. Mild bibasilar pleural parenchymal thickening consistent with scarring. No focal infiltrate. Calcified pulmonary nodule left lung base, stable. No pleural effusion or pneumothorax. No acute bony abnormality.  IMPRESSION: No acute cardiopulmonary disease.   Electronically Signed   By: Marcello Moores  Register   On: 04/21/2014 12:47   Dg Chest Port 1 View  04/23/2014   CLINICAL DATA:  Cough and wheezing.  EXAM: PORTABLE CHEST - 1 VIEW  COMPARISON:  04/21/2014  FINDINGS: Lungs are adequately inflated without consolidation or effusion. Cardiomediastinal silhouette is within normal. There is calcified plaque over the thoracic aorta. Remainder of the exam is unchanged.  IMPRESSION: No active disease.   Electronically Signed   By: Marin Olp M.D.   On: 04/23/2014 10:50    Microbiology: No results found for this or any previous visit (from the past 240 hour(s)).   Labs: Basic Metabolic Panel:  Recent Labs Lab 04/21/14 1541 04/22/14 0515 04/24/14 0535  NA 140 139 133*  K 4.6 3.9 3.9  CL 103 101 98  CO2 27 28 30   GLUCOSE 110* 101* 151*  BUN 14 12 26*  CREATININE 0.96 0.97 1.12  CALCIUM 9.0 8.7 8.3*  MG 1.9  --   --    Liver Function Tests:  Recent Labs Lab 04/21/14 1541  AST 24  ALT 18  ALKPHOS 55  BILITOT 1.1  PROT 6.6  ALBUMIN 4.5   No results for input(s): LIPASE, AMYLASE in the last 168 hours. No results for input(s): AMMONIA in the last 168 hours. CBC:  Recent Labs Lab 04/21/14 1541 04/22/14 0515 04/24/14 0535 04/25/14 0511 04/26/14 0512 04/27/14 0010  WBC 11.2* 8.9 19.0* 24.1* 18.1* 15.9*  NEUTROABS 5.5  --   --   --   --   --   HGB 10.7* 10.4* 11.1* 12.3* 12.1* 11.7*  HCT 32.8* 31.4* 33.0* 37.1* 35.7* 35.3*  MCV 97.0 96.0 94.3 93.0 93.9 93.9  PLT 140* 133* 203 245 201 196   Cardiac Enzymes:  Recent Labs Lab 04/21/14 2237 04/22/14 0515 04/26/14 0512 04/26/14 1154 04/26/14 1600   TROPONINI 0.79* 0.61* 1.13* 1.62* 1.45*   BNP: BNP (last 3 results)  Recent Labs  04/21/14 1541  BNP 484.0*    ProBNP (last 3 results)  No results for input(s): PROBNP in the last 8760 hours.  CBG: No results for input(s): GLUCAP in the last 168 hours.     Signed:  Charlynne Cousins  [facility] 04/27/2014, 10:16 AM

## 2014-04-27 NOTE — Progress Notes (Signed)
ANTICOAGULATION CONSULT NOTE - Initial Consult  Pharmacy Consult for apixaban Indication: atrial fibrillation  Allergies  Allergen Reactions  . Aspirin Anaphylaxis, Shortness Of Breath and Swelling  . Nsaids Anaphylaxis, Shortness Of Breath and Swelling  . Septra [Sulfamethoxazole-Trimethoprim] Rash  . Benicar [Olmesartan Medoxomil]     dizzy  . Salicylates     SOB  . Ampicillin Rash    Patient Measurements: Height: 5\' 8"  (172.7 cm) Weight: 155 lb 10.3 oz (70.6 kg) IBW/kg (Calculated) : 68.4   Vital Signs: Temp: 97.8 F (36.6 C) (02/22 0444) Temp Source: Oral (02/22 0444) BP: 142/80 mmHg (02/22 0444) Pulse Rate: 61 (02/22 0444)  Labs:  Recent Labs  04/25/14 0511 04/26/14 0512 04/26/14 1154 04/26/14 1600 04/27/14 0010  HGB 12.3* 12.1*  --   --  11.7*  HCT 37.1* 35.7*  --   --  35.3*  PLT 245 201  --   --  196  HEPARINUNFRC 0.47  --   --  0.34 0.44  TROPONINI  --  1.13* 1.62* 1.45*  --     Estimated Creatinine Clearance: 43.3 mL/min (by C-G formula based on Cr of 1.12).   Medical History: Past Medical History  Diagnosis Date  . Osteopenia 2006  . Hyperlipidemia   . BPH (benign prostatic hypertrophy)   . Transient global amnesia   . Osteoarthritis   . History of TIA (transient ischemic attack) 1980'S    NO RESIDUAL  . COPD (chronic obstructive pulmonary disease) with emphysema   . Chronic back pain   . Itching SECONDARY TO CLL-- CONTROLLED W/ SINGULAIR  . Frequency   . Nocturia   . Hypertension CARDIOLOGIST- DR BRACKBILL-- LAST VISIT 12-15-2010 NOTE IN EPIC  . CLL (chronic lymphocytic leukemia) LAST PLT COUNT APRL 2012  168    Managed at [facility]  . Bladder cancer   . Aspirin allergy     Eyes swell  . Asthma   . Dyspnea on exertion   . CAD (coronary artery disease)     a. 08/2012 Cath: LM nl, LAD 90p, LCX 20-30, RCA 100ost fills via L->R collats, EF 55-65%;  b. 09/2012 PCI of prox LAD with 3.0x16 Promus DES.    Assessment: 17 yoM  with PMHx CAD with total occlusion of RCA and s/p stent to LAD 7/'14 and COPD gold III presents with SOB likely 2/2 AECOPD as well as possible AECHF. Started on IV heparin for brief episode atrial fibrillation on 2/18. Cardiology consulted, note troponin levels flat, not suggestive of ACS.  Patient has been on heparin, now converting to apixaban.  Pharmacy consulted to dose.  Goal of Therapy:  apixaban per dosing guidelines   Plan:  Stop heparin drip @ 0700 apixaban 5mg  twice daily starting @ 0800 Follow renal function/signs of bleeding  Dolly Rias RPh 04/27/2014, 7:13 AM Pager [phone]

## 2014-04-27 NOTE — Progress Notes (Signed)
Spoke with pt concerning Home Health needs. At present time pt did not know of any and states, "My wife is on her way up here she will know."

## 2014-04-27 NOTE — Progress Notes (Signed)
PT Cancellation Note  Patient Details Name: Johnathan Strong MRN: [id number] DOB: 19-Mar-1923   Cancelled Treatment:    Reason Eval/Treat Not Completed: Other (comment) (spoke with pt son (pt in shower), no needs/declined PT, to D/C today)   [facility] 04/27/2014, 11:35 AM

## 2014-04-27 NOTE — Progress Notes (Signed)
    Subjective:  Feels well today. Eager to go home. Denies chest pain or shortness of breath.  Objective:  Vital Signs in the last 24 hours: Temp:  [97.7 F (36.5 C)-98.2 F (36.8 C)] 97.8 F (36.6 C) (02/22 0444) Pulse Rate:  [56-115] 61 (02/22 0444) Resp:  [16-18] 18 (02/22 0444) BP: (103-142)/(57-80) 142/80 mmHg (02/22 0444) SpO2:  [90 %-97 %] 95 % (02/22 0444) Weight:  [155 lb 10.3 oz (70.6 kg)-161 lb 2.5 oz (73.1 kg)] 155 lb 10.3 oz (70.6 kg) (02/22 0444)  Intake/Output from previous day: 02/21 0701 - 02/22 0700 In: 1200 [P.O.:1200] Out: -   Physical Exam: Pt is alert and oriented, pleasant elderly male in NAD HEENT: normal Neck: JVP - normal Lungs: CTA bilaterally CV: RRR without murmur or gallop Abd: soft, NT, Positive BS, no hepatomegaly Ext: no C/C/E, distal pulses intact and equal Skin: warm/dry no rash   Lab Results:  Recent Labs  04/26/14 0512 04/27/14 0010  WBC 18.1* 15.9*  HGB 12.1* 11.7*  PLT 201 196   No results for input(s): NA, K, CL, CO2, GLUCOSE, BUN, CREATININE in the last 72 hours.  Recent Labs  04/26/14 1154 04/26/14 1600  TROPONINI 1.62* 1.45*    Cardiac Studies: 2-D echocardiogram: Study Conclusions  - Left ventricle: The cavity size was normal. There was mild concentric hypertrophy. Systolic function was normal. The estimated ejection fraction was in the range of 60% to 65%. Wall motion was normal; there were no regional wall motion abnormalities. Doppler parameters are consistent with abnormal left ventricular relaxation (grade 1 diastolic dysfunction). Doppler parameters are consistent with high ventricular filling pressure. - Mitral valve: Moderately calcified annulus. There was mild regurgitation. - Left atrium: The atrium was mildly dilated.  Tele: Atrial fibrillation last at 6 PM yesterday. Now in sinus rhythm heart rate 68 bpm  Assessment/Plan:  1. Acute COPD exacerbation 2. Paroxysmal atrial  fibrillation with RVR, now in sinus rhythm 3. Coronary artery disease with known chronic occlusion of the RCA and previous stenting of the LAD 4. Demand ischemia. Elevated troponin with flat trend and absence of chest pain or pressure suggestive of demand ischemia rather than ACS  The patient appears stable from a cardiac perspective. He has paroxysmal atrial fibrillation and should be anticoagulated with an oral agent. Eliquis has been suggested. Will start Eliquis and discontinue IV heparin. Will consolidate his diltiazem to Cardizem CD 120 mg daily. Will stop amlodipine since Cardizem has been initiated. The patient appears stable for discharge from a cardiac perspective. Will arrange follow-up with Dr. Mare Ferrari. We discussed the pros and cons of oral anticoagulant therapy in the setting of his particular risk factors for stroke. The patient's CHADS-Vasc score is 4. He will remain on plavix in setting of CAD, previous LAD stenting, elevated troponin this admission, and ASA allergy.   Sherren Mocha, M.D. 04/27/2014, 6:51 AM

## 2014-04-27 NOTE — Discharge Instructions (Signed)
Information on my medicine - ELIQUIS® (apixaban) ° °This medication education was reviewed with me or my healthcare representative as part of my discharge preparation.  The pharmacist that spoke with me during my hospital stay was:  Isha Seefeld E, RPH ° °Why was Eliquis® prescribed for you? °Eliquis® was prescribed for you to reduce the risk of forming blood clots that can cause a stroke if you have a medical condition called atrial fibrillation (a type of irregular heartbeat) OR to reduce the risk of a blood clots forming after orthopedic surgery. ° °What do You need to know about Eliquis® ? °Take your Eliquis® TWICE DAILY - one tablet in the morning and one tablet in the evening with or without food.  It would be best to take the doses about the same time each day. ° °If you have difficulty swallowing the tablet whole please discuss with your pharmacist how to take the medication safely. ° °Take Eliquis® exactly as prescribed by your doctor and DO NOT stop taking Eliquis® without talking to the doctor who prescribed the medication.  Stopping may increase your risk of developing a new clot or stroke.  Refill your prescription before you run out. ° °After discharge, you should have regular check-up appointments with your healthcare provider that is prescribing your Eliquis®.  In the future your dose may need to be changed if your kidney function or weight changes by a significant amount or as you get older. ° °What do you do if you miss a dose? °If you miss a dose, take it as soon as you remember on the same day and resume taking twice daily.  Do not take more than one dose of ELIQUIS at the same time. ° °Important Safety Information °A possible side effect of Eliquis® is bleeding. You should call your healthcare provider right away if you experience any of the following: °? Bleeding from an injury or your nose that does not stop. °? Unusual colored urine (red or dark brown) or unusual colored stools (red or  black). °? Unusual bruising for unknown reasons. °? A serious fall or if you hit your head (even if there is no bleeding). ° °Some medicines may interact with Eliquis® and might increase your risk of bleeding or clotting while on Eliquis®. To help avoid this, consult your healthcare provider or pharmacist prior to using any new prescription or non-prescription medications, including herbals, vitamins, non-steroidal anti-inflammatory drugs (NSAIDs) and supplements. ° °This website has more information on Eliquis® (apixaban): www.Eliquis.com. ° ° °

## 2014-04-28 ENCOUNTER — Ambulatory Visit (INDEPENDENT_AMBULATORY_CARE_PROVIDER_SITE_OTHER): Payer: Medicare Other | Admitting: Family Medicine

## 2014-04-28 ENCOUNTER — Encounter: Payer: Self-pay | Admitting: Family Medicine

## 2014-04-28 ENCOUNTER — Telehealth: Payer: Self-pay | Admitting: Family Medicine

## 2014-04-28 VITALS — BP 136/62 | HR 67 | Temp 96.9°F | Ht 68.0 in | Wt 163.0 lb

## 2014-04-28 DIAGNOSIS — Z8679 Personal history of other diseases of the circulatory system: Secondary | ICD-10-CM

## 2014-04-28 DIAGNOSIS — Z09 Encounter for follow-up examination after completed treatment for conditions other than malignant neoplasm: Secondary | ICD-10-CM

## 2014-04-28 DIAGNOSIS — J9601 Acute respiratory failure with hypoxia: Secondary | ICD-10-CM

## 2014-04-28 NOTE — Patient Instructions (Signed)
The patient should drink fluids and get himself rehydrated. He should increase his activity gradually He should finish the prednisone and a tapering dose He should continue to take the medicines as prescribed on Hospital discharge He should follow-up with Dr. Mare Ferrari as planned Continue to use Symbicort regularly and Mucinex

## 2014-04-28 NOTE — Telephone Encounter (Signed)
appt made and pt wife aware

## 2014-04-28 NOTE — Progress Notes (Signed)
Subjective:    Patient ID: [facility], male    DOB: 1923/09/13, 81 y.o.   MRN: [id number]  HPI Patient here today for hospital follow up from [facility] for bronchitis and Atrial Fibrillation. He is feeling some better, but still concerned. He is accompanied today by his wife. He was admitted to the hospital 1 week ago with acute hypoxia and respiratory failure presumed to be due to pneumonia and/or an acute COPD exacerbation. He also had a bout of atrial fibrillation and was started on Eliquis requests for this. It was recommended he follow-up with the cardiologist also. He was seen by Dr. Burt Knack in the hospital. The patient is currently using soap and neck nebulizer solution if needed and Symbicort 160/4.5 inhaler. He also has an albuterol inhaler.         Patient Active Problem List   Diagnosis Date Noted  . Atrial fibrillation 04/26/2014  . Atrial fibrillation with RVR 04/26/2014  . Acute respiratory failure with hypoxia 04/21/2014  . Bladder cancer 03/10/2014  . Testosterone deficiency 06/24/2013  . ASCVD (arteriosclerotic cardiovascular disease) 06/24/2013  . High risk medication use 02/20/2013  . Hyperlipemia 10/21/2012  . Unstable angina 09/27/2012  . CAD (coronary artery disease)   . DOE (dyspnea on exertion) 08/02/2012  . Dizziness 08/02/2012  . Hypertension 12/15/2010  . Chronic lymphocytic leukemia 12/15/2010  . COPD GOLD III 08/30/2010   Outpatient Encounter Prescriptions as of 04/28/2014  Medication Sig  . albuterol (VENTOLIN HFA) 108 (90 BASE) MCG/ACT inhaler Inhale 2 puffs into the lungs every 6 (six) hours as needed for wheezing.   [name] apixaban (ELIQUIS) 5 MG TABS tablet Take 1 tablet (5 mg total) by mouth 2 (two) times daily.  . Azelastine HCl (ASTELIN NA) Place 1 spray into the nose as needed (for congestion).   . Cholecalciferol (VITAMIN D3) 1000 UNITS CAPS Take 1 capsule by mouth daily.   . cimetidine (TAGAMET) 300 MG tablet TAKE 1 TABLET UP TO 4 TIMES A  DAY (Patient taking differently: TAKE 1 TABLET UP TO 4 TIMES A DAY as needed)  . clopidogrel (PLAVIX) 75 MG tablet TAKE 1 TABLET ONCE A DAY  . DEPO-TESTOSTERONE 200 MG/ML injection INJECT 1 ML INTRAMUSCULARLY MONTHLY  . diltiazem (CARDIZEM CD) 120 MG 24 hr capsule Take 1 capsule (120 mg total) by mouth daily.  . diphenhydramine-acetaminophen (TYLENOL PM) 25-500 MG TABS Take 1 tablet by mouth at bedtime as needed. sleep  . fluticasone (FLONASE) 50 MCG/ACT nasal spray 2 SPRAYS INTO EACH NOSTRIL ONCE A DAY (Patient taking differently: 2 SPRAYS INTO EACH NOSTRIL BID prn for allergies)  . furosemide (LASIX) 20 MG tablet TAKE (1) TABLET DAILY AS DIRECTED. (Patient taking differently: TAKE (1) TABLET DAILY AS DIRECTED as needed for swelling)  . levalbuterol (XOPENEX) 0.63 MG/3ML nebulizer solution Take 3 mLs (0.63 mg total) by nebulization once.  [name] LIPITOR 10 MG tablet TAKE 1 TABLET DAILY  . losartan (COZAAR) 50 MG tablet TAKE (1) TABLET TWICE A DAY.  . metoprolol tartrate (LOPRESSOR) 25 MG tablet Take 1 tablet (25 mg total) by mouth 2 (two) times daily.  . montelukast (SINGULAIR) 10 MG tablet TAKE ONE TABLET AT BEDTIME  . Omega-3 Fatty Acids (FISH OIL) 1200 MG CAPS Take 1 capsule by mouth daily.   . SYMBICORT 160-4.5 MCG/ACT inhaler 2 PUFFS EVERY 12 HOURS  . tiotropium (SPIRIVA HANDIHALER) 18 MCG inhalation capsule INHALE THE CONTENTS OF ONE CAPSULE ONCE DAILY AS DIRECTED  . traMADol (ULTRAM) 50 MG tablet  TAKE 1 TABLET UP TO 2 TIMES A DAY (Patient taking differently: TAKE 17m by mouth 2 TIMES A DAY)  . triamcinolone cream (KENALOG) 0.1 % APPLY TO AFFECTED AREAS 2 TIMES A DAY AS NEEDED  . [DISCONTINUED] levofloxacin (LEVAQUIN) 500 MG tablet Take 1 tablet (500 mg total) by mouth daily.  . nitroGLYCERIN (NITROSTAT) 0.4 MG SL tablet Place 1 tablet (0.4 mg total) under the tongue every 5 (five) minutes as needed for chest pain. (Patient not taking: Reported on 04/28/2014)  . predniSONE (DELTASONE) 5 MG  tablet Take 3 tabs for 1 day then 2 tabs for 1 day then 1 tab for 1 day. (Patient not taking: Reported on 04/28/2014)     Review of Systems  Constitutional: Negative.   HENT: Positive for congestion.   Eyes: Negative.   Respiratory: Positive for cough.   Cardiovascular: Negative.   Gastrointestinal: Positive for constipation.  Endocrine: Negative.   Genitourinary: Negative.   Musculoskeletal: Negative.   Skin: Negative.   Allergic/Immunologic: Negative.   Neurological: Negative.   Hematological: Negative.   Psychiatric/Behavioral: Negative.        Objective:   Physical Exam  Constitutional: He is oriented to person, place, and time. He appears well-developed and well-nourished. No distress.  HENT:  Head: Normocephalic and atraumatic.  Right Ear: External ear normal.  Left Ear: External ear normal.  Nose: Nose normal.  Mouth/Throat: Oropharynx is clear and moist. No oropharyngeal exudate.  Eyes: Conjunctivae and EOM are normal. Pupils are equal, round, and reactive to light. Right eye exhibits no discharge. Left eye exhibits no discharge. No scleral icterus.  Neck: Normal range of motion. Neck supple. No thyromegaly present.  No anterior cervical adenopathy and no carotid bruits today.  Cardiovascular: Normal rate, regular rhythm and normal heart sounds.   No murmur heard. The heart had a regular rate and rhythm and with only slight irregularity.  Pulmonary/Chest: Effort normal and breath sounds normal. No respiratory distress. He has no wheezes. He has no rales. He exhibits no tenderness.  The lungs were somewhat tighter than usual but improved from before. The congestion in the left lung base was diminished compared to the visit prior to his hospitalization.  Abdominal: Soft. Bowel sounds are normal. He exhibits no mass. There is no tenderness. There is no rebound and no guarding.  The abdomen has some gas and distention.  Musculoskeletal: Normal range of motion. He exhibits  no edema.  Lymphadenopathy:    He has no cervical adenopathy.  Neurological: He is alert and oriented to person, place, and time.  Skin: Skin is warm and dry. No rash noted. No erythema. No pallor.  Psychiatric: He has a normal mood and affect. His behavior is normal. Judgment and thought content normal.  Nursing note and vitals reviewed.  BP 136/62 mmHg  Pulse 67  Temp(Src) 96.9 F (36.1 C) (Oral)  Ht _0  (1.727 m)  Wt 163 lb (73.936 kg)  BMI 24.79 kg/m2  SpO2 95%        Assessment & Plan:  1. Hospital discharge follow-up -Patient is improved and feeling better. -Continue with Mucinex plain 1 twice daily for cough and congestion -Use inhalers i.e. Symbicort regularly. - POCT CBC - BMP8+EGFR  2. Acute respiratory failure with hypoxia -The pulse ox today was 95%. -The patient should finish his prednisone as directed per hospital discharge  3. History of atrial fibrillation -The heart was in normal sinus rhythm today with only a rare PVC -Continue with eliquis  Patient Instructions  The patient should drink fluids and get himself rehydrated. He should increase his activity gradually He should finish the prednisone and a tapering dose He should continue to take the medicines as prescribed on Hospital discharge He should follow-up with Dr. Mare Ferrari as planned Continue to use Symbicort regularly and Mucinex   Arrie Senate MD

## 2014-05-14 ENCOUNTER — Encounter: Payer: Self-pay | Admitting: Cardiology

## 2014-05-14 ENCOUNTER — Ambulatory Visit (INDEPENDENT_AMBULATORY_CARE_PROVIDER_SITE_OTHER): Payer: 59 | Admitting: Cardiology

## 2014-05-14 VITALS — BP 168/60 | HR 55 | Ht 68.0 in | Wt 159.0 lb

## 2014-05-14 DIAGNOSIS — I119 Hypertensive heart disease without heart failure: Secondary | ICD-10-CM | POA: Diagnosis not present

## 2014-05-14 DIAGNOSIS — I480 Paroxysmal atrial fibrillation: Secondary | ICD-10-CM | POA: Insufficient documentation

## 2014-05-14 DIAGNOSIS — I259 Chronic ischemic heart disease, unspecified: Secondary | ICD-10-CM

## 2014-05-14 NOTE — Patient Instructions (Signed)
Your physician recommends that you continue on your current medications as directed. Please refer to the Current Medication list given to you today.  Your physician recommends that you schedule a follow-up appointment in: 3 month ov/ekg 

## 2014-05-14 NOTE — Progress Notes (Signed)
Cardiology Office Note   Date:  05/14/2014   ID:  Johnathan Strong, DOB 1924/01/07, MRN [id number]  PCP:  Redge Gainer, MD  Cardiologist:   Johnathan Coco, MD   No chief complaint on file.     History of Present Illness: Johnathan Strong is a 81 y.o. male who presents for a post hospital follow-up visit [name] is seen today for followup office visit. He presented in April 2014 with symptoms of dyspnea on exertion. Subsequent Myoview study showed significant inferior wall ischemia. He underwent cardiac catheterization on 08/27/2012. This demonstrated ostial occlusion of the right coronary with excellent left to right collateral flow. He had a high-grade proximal LAD stenosis. LV function was well-preserved. He is seen to discuss treatment options today. Patient does have a history of COPD Gold stage III, CLL, TIA, and hypertension. He is aspirin allergic. He subsequently underwent successful DES stenting of his LAD on 09/26/12. He will be on lifelong Plavix. He is allergic to aspirin. 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 He had a P2Y12 assay prior to his stent placement which showed an excellent response of Plavix at 79 PR U The patient has a past history of dyslipidemia and has had good response to low-dose Lipitor 10 mg daily.  He also has a history of bladder cancer and is followed by Dr.Ottelin and a history of CLL for which he is followed by Dr. Broadus [facility] 0. Moore at [facility]. The patient was hospitalized at [facility] from February 16 through 04/27/2014.  He was admitted with exacerbation of his COPD.  While in the hospital he was documented to have several episodes of atrial fibrillation.  For this reason he was discharged on Eliquis.  He remains on Plavix because of his drug-eluting stent. Since discharge she has not been aware of any recurrence of his atrial fibrillation.  However, he was not  aware of it in the hospital when he was in atrial ablation.  He has had some mild exertional dyspnea and chest pressure relieved by rest.  He is able to go up and down his stairs at home without difficulty. On 04/22/14 while in the hospital he had an echocardiogram showing an ejection fraction of 60-65% with grade 1 diastolic dysfunction while in normal sinus rhythm.  There was mild mitral regurgitation.  There was mild left atrial enlargement.  Past Medical History  Diagnosis Date  . Osteopenia 2006  . Hyperlipidemia   . BPH (benign prostatic hypertrophy)   . Transient global amnesia   . Osteoarthritis   . History of TIA (transient ischemic attack) 1980'S    NO RESIDUAL  . COPD (chronic obstructive pulmonary disease) with emphysema   . Chronic back pain   . Itching SECONDARY TO CLL-- CONTROLLED W/ SINGULAIR  . Frequency   . Nocturia   . Hypertension CARDIOLOGIST- DR [facility]-- LAST VISIT 12-15-2010 NOTE IN EPIC  . CLL (chronic lymphocytic leukemia) LAST PLT COUNT APRL 2012  168    Managed at [facility]  . Bladder cancer   . Aspirin allergy     Eyes swell  . Asthma   . Dyspnea on exertion   . CAD (coronary artery disease)     a. 08/2012 Cath: LM nl, LAD 90p, LCX 20-30, RCA 100ost fills via L->R collats, EF 55-65%;  b. 09/2012 PCI of prox LAD with 3.0x16 Promus DES.  Past Surgical History  Procedure Laterality Date  . Excisional hemorrhoidectomy  1980  . Cardiovascular stress test  07/2005    a. 07/2005- no reversible ischemia, normal EF b. 06/2009- EF 73%, no reversible ischemia, inferolateral TWIs at rest, upright in recovery c. 08/2012- mediuim-sized partially reversible basal to mid inferior and inferoseptal perfusion defect c/w with prior infarction and peri-infarct ischemia; EF 68% and borderline ST/T changes on stress ECG   . Transurethral resection of bladder tumor  10-19-2008    AND DILATION URETHRAL STRICTURE  . Cataract extraction w/ intraocular lens   implant, bilateral Bilateral ~ 2007  . Transthoracic echocardiogram  01-03-2011    LVEF 92-11%, grade 1 diastolic dysfunction, no WMAs or structural abnormalities  . Cystoscopy with biopsy  10/30/2011    Procedure: CYSTOSCOPY WITH BIOPSY;  Surgeon: Johnathan Jabs, MD;  Location: [facility];  Service: Urology;  Laterality: N/A;  . Cardiac catheterization  08/27/2012  . Coronary angioplasty with stent placement  09/26/2012    "1" (09/26/2012)  . Coronary stent placement  09/26/12  . Left heart catheterization with coronary angiogram N/A 08/27/2012    Procedure: LEFT HEART CATHETERIZATION WITH CORONARY ANGIOGRAM;  Surgeon: Johnathan M Martinique, MD;  Location: [facility] CATH LAB;  Service: Cardiovascular;  Laterality: N/A;  . Percutaneous coronary stent intervention (pci-s) N/A 09/26/2012    Procedure: PERCUTANEOUS CORONARY STENT INTERVENTION (PCI-S);  Surgeon: Johnathan M Martinique, MD;  Location: [facility] CATH LAB;  Service: Cardiovascular;  Laterality: N/A;     Current Outpatient Prescriptions  Medication Sig Dispense Refill  . albuterol (VENTOLIN HFA) 108 (90 BASE) MCG/ACT inhaler Inhale 2 puffs into the lungs every 6 (six) hours as needed for wheezing.     [name] apixaban (ELIQUIS) 5 MG TABS tablet Take 1 tablet (5 mg total) by mouth 2 (two) times daily. 60 tablet 0  . Azelastine HCl (ASTELIN NA) Place 1 spray into the nose as needed (for congestion).     . Cholecalciferol (VITAMIN D3) 1000 UNITS CAPS Take 1 capsule by mouth daily.     . cimetidine (TAGAMET) 300 MG tablet TAKE 1 TABLET UP TO 4 TIMES A DAY (Patient taking differently: TAKE 1 TABLET UP TO 4 TIMES A DAY as needed) 40 tablet 11  . clopidogrel (PLAVIX) 75 MG tablet TAKE 1 TABLET ONCE A DAY 90 tablet 1  . diltiazem (CARDIZEM CD) 120 MG 24 hr capsule Take 1 capsule (120 mg total) by mouth daily. 30 capsule 3  . diphenhydramine-acetaminophen (TYLENOL PM) 25-500 MG TABS Take 1 tablet by mouth at bedtime as needed. sleep    . fluticasone (FLONASE) 50  MCG/ACT nasal spray 2 SPRAYS INTO EACH NOSTRIL ONCE A DAY (Patient taking differently: 2 SPRAYS INTO EACH NOSTRIL BID prn for allergies) 16 g 3  . furosemide (LASIX) 20 MG tablet TAKE (1) TABLET DAILY AS DIRECTED. (Patient taking differently: TAKE (1) TABLET DAILY AS DIRECTED as needed for swelling) 30 tablet 2  . LIPITOR 10 MG tablet TAKE 1 TABLET DAILY 90 tablet 1  . losartan (COZAAR) 50 MG tablet TAKE (1) TABLET TWICE A DAY. 180 tablet 1  . metoprolol tartrate (LOPRESSOR) 25 MG tablet Take 1 tablet (25 mg total) by mouth 2 (two) times daily. 60 tablet 0  . montelukast (SINGULAIR) 10 MG tablet TAKE ONE TABLET AT BEDTIME 90 tablet 2  . nitroGLYCERIN (NITROSTAT) 0.4 MG SL tablet Place 1 tablet (0.4 mg total) under the tongue every 5 (five) minutes as needed for chest pain. 25 tablet  3  . Omega-3 Fatty Acids (FISH OIL) 1200 MG CAPS Take 1 capsule by mouth daily.     . SYMBICORT 160-4.5 MCG/ACT inhaler 2 PUFFS EVERY 12 HOURS 10.2 g 1  . tiotropium (SPIRIVA HANDIHALER) 18 MCG inhalation capsule INHALE THE CONTENTS OF ONE CAPSULE ONCE DAILY AS DIRECTED 30 capsule 3  . traMADol (ULTRAM) 50 MG tablet TAKE 1 TABLET UP TO 2 TIMES A DAY (Patient taking differently: TAKE 25mg  by mouth 2 TIMES A DAY) 60 tablet 0  . triamcinolone cream (KENALOG) 0.1 % APPLY TO AFFECTED AREAS 2 TIMES A DAY AS NEEDED 45 g 3  . levalbuterol (XOPENEX) 0.63 MG/3ML nebulizer solution Take 3 mLs (0.63 mg total) by nebulization once. (Patient not taking: Reported on 05/14/2014) 3 mL 12   Current Facility-Administered Medications  Medication Dose Route Frequency Provider Last Rate Last Dose  . testosterone cypionate (DEPOTESTOTERONE CYPIONATE) injection 200 mg  200 mg Intramuscular Q28 days Chipper Herb, MD   200 mg at 04/13/14 1057    Allergies:   Aspirin; Nsaids; Septra; Benicar; Salicylates; and Ampicillin    Social History:  The patient  reports that he quit smoking about 54 years ago. His smoking use included Cigarettes. He  has a 25 pack-year smoking history. He quit smokeless tobacco use about 35 years ago. He reports that he drinks alcohol. He reports that he does not use illicit drugs.   Family History:  The patient's family history includes Atrial fibrillation in his father; COPD in his father; Emphysema in his father; Heart disease in his father; Kidney cancer in his mother; Kidney failure in his mother.    ROS:  Please see the history of present illness.   Otherwise, review of systems are positive for none.   All other systems are reviewed and negative.    PHYSICAL EXAM: VS:  BP 168/60 mmHg  Pulse 55  Ht 5\' 8"  (1.727 m)  Wt 159 lb (72.122 kg)  BMI 24.18 kg/m2 , BMI Body mass index is 24.18 kg/(m^2). GEN: Well nourished, well developed, in no acute distress HEENT: normal Neck: no JVD, carotid bruits, or masses Cardiac: RRR; no murmurs, rubs, or gallops,no edema  Respiratory:  clear to auscultation bilaterally, normal work of breathing GI: soft, nontender, nondistended, + BS MS: no deformity or atrophy Skin: warm and dry, no rash Neuro:  Strength and sensation are intact Psych: euthymic mood, full affect   EKG:  EKG is not ordered today.    Recent Labs: 04/21/2014: ALT 18; B Natriuretic Peptide 484.0*; Magnesium 1.9 04/24/2014: BUN 26*; Creatinine 1.12; Potassium 3.9; Sodium 133* 04/27/2014: Hemoglobin 11.7*; Platelets 196    Lipid Panel    Component Value Date/Time   CHOL 109 04/03/2014 1023   CHOL 107 10/30/2013 1152   TRIG 61 04/03/2014 1023   TRIG 77 10/30/2013 1152   HDL 44 04/03/2014 1023   HDL 45 10/30/2013 1152   CHOLHDL 2.4 10/30/2013 1152   LDLCALC 47 10/30/2013 1152   LDLCALC 59 06/19/2013 0943      Wt Readings from Last 3 Encounters:  05/14/14 159 lb (72.122 kg)  04/28/14 163 lb (73.936 kg)  04/27/14 155 lb 10.3 oz (70.6 kg)      Other studies Reviewed: Additional studies/ records that were reviewed today include: Hospital records from [place] [facility] from February 16  through 04/27/2014.  Results of echocardiogram were noted    ASSESSMENT AND PLAN:  1. Ischemic heart disease status post successful drug-eluting stent to high grade proximal LAD  stenosis. The patient has a known ostial occlusion of the right coronary artery with excellent left to right collateral flow. He is allergic to aspirin. He will be on lifelong Plavix. 2. Hypercholesterolemia on low-dose Lipitor 3. History of bladder cancer followed by Dr. Karsten Ro 4. History of CLL followed by Dr. Sabas Sous at [facility] 5. Essential hypertension without heart failure 6.  Paroxysmal atrial fibrillation, currently holding normal sinus rhythm.  HisChadssvasc score is 6 for hypertension, age above 48, history of TIA, and vascular disease with prior coronary artery stent.   Current medicines are reviewed at length with the patient today.  The patient does not have concerns regarding medicines.  The following changes have been made:  no change  Labs/ tests ordered today include:  No orders of the defined types were placed in this encounter.     Disposition:   FU with Dr. Mare Ferrari in 3  months for office visit and EKG   Signed, Johnathan Coco, MD  05/14/2014 11:25 AM    [facility] [address], [place], [place]  [address] Phone: [phone]; Fax: [phone]

## 2014-05-15 ENCOUNTER — Ambulatory Visit (INDEPENDENT_AMBULATORY_CARE_PROVIDER_SITE_OTHER): Payer: Medicare Other | Admitting: *Deleted

## 2014-05-15 DIAGNOSIS — E291 Testicular hypofunction: Secondary | ICD-10-CM

## 2014-05-15 DIAGNOSIS — E349 Endocrine disorder, unspecified: Secondary | ICD-10-CM

## 2014-05-15 NOTE — Patient Instructions (Signed)

## 2014-05-15 NOTE — Progress Notes (Signed)
Testosterone injection given and tolerated well.  

## 2014-05-19 ENCOUNTER — Other Ambulatory Visit: Payer: Self-pay | Admitting: *Deleted

## 2014-05-19 MED ORDER — DILTIAZEM HCL ER COATED BEADS 120 MG PO CP24: 120.0000 mg | ORAL_CAPSULE | Freq: Every day | ORAL | Status: DC

## 2014-05-19 MED ORDER — APIXABAN 5 MG PO TABS: 5.0000 mg | ORAL_TABLET | Freq: Two times a day (BID) | ORAL | Status: DC

## 2014-05-19 MED ORDER — METOPROLOL TARTRATE 25 MG PO TABS: 25.0000 mg | ORAL_TABLET | Freq: Two times a day (BID) | ORAL | Status: DC

## 2014-05-25 ENCOUNTER — Other Ambulatory Visit: Payer: Self-pay | Admitting: Family Medicine

## 2014-06-09 ENCOUNTER — Ambulatory Visit: Payer: Medicare Other | Admitting: Family Medicine

## 2014-06-16 ENCOUNTER — Ambulatory Visit (INDEPENDENT_AMBULATORY_CARE_PROVIDER_SITE_OTHER): Payer: Medicare Other | Admitting: *Deleted

## 2014-06-16 DIAGNOSIS — E349 Endocrine disorder, unspecified: Secondary | ICD-10-CM

## 2014-06-16 DIAGNOSIS — E291 Testicular hypofunction: Secondary | ICD-10-CM

## 2014-06-16 NOTE — Patient Instructions (Signed)

## 2014-06-16 NOTE — Progress Notes (Signed)
Pt given testosterone 200mg  injection IM LOUQ and tolerated well.

## 2014-06-17 ENCOUNTER — Other Ambulatory Visit: Payer: Self-pay | Admitting: Family Medicine

## 2014-07-06 ENCOUNTER — Other Ambulatory Visit: Payer: Self-pay | Admitting: Family Medicine

## 2014-07-14 ENCOUNTER — Encounter: Payer: Self-pay | Admitting: Family Medicine

## 2014-07-14 ENCOUNTER — Ambulatory Visit (INDEPENDENT_AMBULATORY_CARE_PROVIDER_SITE_OTHER): Payer: Medicare Other | Admitting: Family Medicine

## 2014-07-14 VITALS — BP 178/68 | HR 66 | Temp 96.8°F | Ht 68.0 in | Wt 164.0 lb

## 2014-07-14 DIAGNOSIS — E559 Vitamin D deficiency, unspecified: Secondary | ICD-10-CM | POA: Diagnosis not present

## 2014-07-14 DIAGNOSIS — E291 Testicular hypofunction: Secondary | ICD-10-CM

## 2014-07-14 DIAGNOSIS — M6790 Unspecified disorder of synovium and tendon, unspecified site: Secondary | ICD-10-CM | POA: Diagnosis not present

## 2014-07-14 DIAGNOSIS — Z79899 Other long term (current) drug therapy: Secondary | ICD-10-CM

## 2014-07-14 DIAGNOSIS — I119 Hypertensive heart disease without heart failure: Secondary | ICD-10-CM

## 2014-07-14 DIAGNOSIS — I2510 Atherosclerotic heart disease of native coronary artery without angina pectoris: Secondary | ICD-10-CM

## 2014-07-14 DIAGNOSIS — J449 Chronic obstructive pulmonary disease, unspecified: Secondary | ICD-10-CM | POA: Diagnosis not present

## 2014-07-14 DIAGNOSIS — E785 Hyperlipidemia, unspecified: Secondary | ICD-10-CM

## 2014-07-14 DIAGNOSIS — C9110 Chronic lymphocytic leukemia of B-cell type not having achieved remission: Secondary | ICD-10-CM | POA: Diagnosis not present

## 2014-07-14 DIAGNOSIS — I2583 Coronary atherosclerosis due to lipid rich plaque: Secondary | ICD-10-CM

## 2014-07-14 DIAGNOSIS — E349 Endocrine disorder, unspecified: Secondary | ICD-10-CM

## 2014-07-14 MED ORDER — TRAMADOL HCL 50 MG PO TABS: ORAL_TABLET | ORAL | Status: DC

## 2014-07-14 NOTE — Patient Instructions (Addendum)
Medicare Annual Wellness Visit  [facility] and the medical providers at [facility] strive to bring you the best medical care.  In doing so we not only want to address your current medical conditions and concerns but also to detect new conditions early and prevent illness, disease and health-related problems.    Medicare offers a yearly Wellness Visit which allows our clinical staff to assess your need for preventative services including immunizations, lifestyle education, counseling to decrease risk of preventable diseases and screening for fall risk and other medical concerns.    This visit is provided free of charge (no copay) for all Medicare recipients. The clinical pharmacists at [facility] have begun to conduct these Wellness Visits which will also include a thorough review of all your medications.    As you primary medical provider recommend that you make an appointment for your Annual Wellness Visit if you have not done so already this year.  You may set up this appointment before you leave today or you may call back ([phone]) and schedule an appointment.  Please make sure when you call that you mention that you are scheduling your Annual Wellness Visit with the clinical pharmacist so that the appointment may be made for the proper length of time.     Continue current medications. Continue good therapeutic lifestyle changes which include good diet and exercise. Fall precautions discussed with patient. If an FOBT was given today- please return it to our front desk. If you are over 79 years old - you may need Prevnar 7 or the adult Pneumonia vaccine.  Flu Shots are still available at our office. If you still haven't had one please call to set up a nurse visit to get one.   After your visit with [place] today you will receive a survey in the mail or online from [employer] regarding your care with [place]. Please take a moment to  fill this out. Your feedback is very important to [place] as you can help [place] better understand your patient needs as well as improve your experience and satisfaction. WE CARE ABOUT YOU!!!   We will arrange for you to see the orthopedic specialist that deals with extremities in [facility] to evaluate the tendinopathy in the left plantar foot. Always stay on top of your breathing and if you develop any increased problems with cough and congestion usually sure inhalers more regularly Drink plenty of fluids Follow-up with cardiology as planned Follow-up with hematology at [facility] as planned Follow-up with urology as planned Please check blood pressures at home and bring these readings by for review with the check by the nurse here in a couple of weeks

## 2014-07-14 NOTE — Progress Notes (Signed)
Subjective:    Patient ID: Johnathan Strong, male    DOB: [birth year], 81 y.o.   MRN: [id number]  HPI Pt here for follow up and management of chronic medical problems which includes hyperlipidemia, COPD, and CLL. He is taking medications regularly. The patient continues to have left foot pain. It is significant to note that the patient was hospitalized in February with acute respiratory failure and hypoxia. The patient recently saw the cardiologist and also the hematologist and from their perspective as everything is stable. He brings in some information today regarding tendon issues after taking Levaquin. He has noticed since taking Levaquin that he has developed some knots on the tendons in the plantar surface of his left foot which have been painful and made his walking somewhat more difficult. He denies chest pain or shortness of breath and only occasionally has some problems with swallowing food which she attributes to not chewing his food up as well as he should. He also denies any urological issues. He does see the urologist regularly because of a history of water cancer.        Patient Active Problem List   Diagnosis Date Noted  . Paroxysmal atrial fibrillation 05/14/2014  . Atrial fibrillation 04/26/2014  . Atrial fibrillation with RVR 04/26/2014  . Acute respiratory failure with hypoxia 04/21/2014  . Bladder cancer 03/10/2014  . Testosterone deficiency 06/24/2013  . ASCVD (arteriosclerotic cardiovascular disease) 06/24/2013  . High risk medication use 02/20/2013  . Hyperlipemia 10/21/2012  . Unstable angina 09/27/2012  . CAD (coronary artery disease)   . DOE (dyspnea on exertion) 08/02/2012  . Dizziness 08/02/2012  . Chronic lymphatic leukemia 07/04/2012  . Hypertension 12/15/2010  . Chronic lymphocytic leukemia 12/15/2010  . COPD GOLD III 08/30/2010   Outpatient Encounter Prescriptions as of 07/14/2014  Medication Sig  . albuterol (VENTOLIN HFA) 108 (90 BASE) MCG/ACT  inhaler Inhale 2 puffs into the lungs every 6 (six) hours as needed for wheezing.   [name] apixaban (ELIQUIS) 5 MG TABS tablet Take 1 tablet (5 mg total) by mouth 2 (two) times daily.  . Azelastine HCl (ASTELIN NA) Place 1 spray into the nose as needed (for congestion).   . Cholecalciferol (VITAMIN D3) 1000 UNITS CAPS Take 1 capsule by mouth daily.   . cimetidine (TAGAMET) 300 MG tablet TAKE 1 TABLET UP TO 4 TIMES A DAY (Patient taking differently: TAKE 1 TABLET UP TO 4 TIMES A DAY as needed)  . clopidogrel (PLAVIX) 75 MG tablet TAKE 1 TABLET ONCE A DAY  . diltiazem (CARDIZEM CD) 120 MG 24 hr capsule Take 1 capsule (120 mg total) by mouth daily.  . diphenhydramine-acetaminophen (TYLENOL PM) 25-500 MG TABS Take 1 tablet by mouth at bedtime as needed. sleep  . fluticasone (FLONASE) 50 MCG/ACT nasal spray USE 2 SPRAYS INTO EACH NOSTRIL ONCE DAILY  . furosemide (LASIX) 20 MG tablet TAKE (1) TABLET DAILY AS DIRECTED. (Patient taking differently: TAKE (1) TABLET DAILY AS DIRECTED as needed for swelling)  . LIPITOR 10 MG tablet TAKE 1 TABLET DAILY  . losartan (COZAAR) 50 MG tablet TAKE (1) TABLET TWICE A DAY.  . metoprolol tartrate (LOPRESSOR) 25 MG tablet Take 1 tablet (25 mg total) by mouth 2 (two) times daily.  . montelukast (SINGULAIR) 10 MG tablet TAKE ONE TABLET AT BEDTIME  . nitroGLYCERIN (NITROSTAT) 0.4 MG SL tablet Place 1 tablet (0.4 mg total) under the tongue every 5 (five) minutes as needed for chest pain.  . Omega-3 Fatty Acids (FISH OIL)  1200 MG CAPS Take 1 capsule by mouth daily.   [name] SPIRIVA HANDIHALER 18 MCG inhalation capsule INHALE THE CONTENTS OF ONE CAPSULE ONCE DAILY AS DIRECTED  . SYMBICORT 160-4.5 MCG/ACT inhaler 2 PUFFS EVERY 12 HOURS  . traMADol (ULTRAM) 50 MG tablet TAKE 1 TABLET UP TO 2 TIMES A DAY (Patient taking differently: TAKE 25mg  by mouth 2 TIMES A DAY)  . triamcinolone cream (KENALOG) 0.1 % APPLY TO AFFECTED AREAS 2 TIMES A DAY AS NEEDED  . [DISCONTINUED] levalbuterol  (XOPENEX) 0.63 MG/3ML nebulizer solution Take 3 mLs (0.63 mg total) by nebulization once.   No facility-administered encounter medications on file as of 07/14/2014.     Review of Systems  Constitutional: Negative.   HENT: Negative.   Eyes: Negative.   Respiratory: Negative.   Cardiovascular: Negative.   Gastrointestinal: Negative.   Endocrine: Negative.   Genitourinary: Negative.   Musculoskeletal: Positive for arthralgias (on-going left foot pain).  Skin: Negative.   Allergic/Immunologic: Negative.   Neurological: Negative.   Hematological: Negative.   Psychiatric/Behavioral: Negative.        Objective:   Physical Exam  Constitutional: He is oriented to person, place, and time. He appears well-developed and well-nourished. No distress.  Especially for his age of 44 years.  HENT:  Head: Normocephalic and atraumatic.  Right Ear: External ear normal.  Left Ear: External ear normal.  Nose: Nose normal.  Mouth/Throat: Oropharynx is clear and moist. No oropharyngeal exudate.  Eyes: Conjunctivae and EOM are normal. Pupils are equal, round, and reactive to light. Right eye exhibits no discharge. Left eye exhibits no discharge. No scleral icterus.  Neck: Normal range of motion. Neck supple. No thyromegaly present.  Neck without bruits or adenopathy  Cardiovascular: Normal rate, regular rhythm, normal heart sounds and intact distal pulses.   No murmur heard. The rhythm is regular at 60/m  Pulmonary/Chest: Effort normal and breath sounds normal. No respiratory distress. He has no wheezes. He has no rales. He exhibits no tenderness.  There is no wheezing or chest congestion on auscultation  Abdominal: Soft. Bowel sounds are normal. He exhibits no mass. There is no tenderness. There is no rebound and no guarding.  There is no epigastric tenderness  Musculoskeletal: Normal range of motion. He exhibits no edema or tenderness.  On palpation of the plantar tendons of the left foot there  are definitely some knots in the tendon that are tender to palpation. This is what the patient is concerned about.  Lymphadenopathy:    He has no cervical adenopathy.  Neurological: He is alert and oriented to person, place, and time. He has normal reflexes. No cranial nerve deficit.  Skin: Skin is warm and dry. No rash noted.  Psychiatric: He has a normal mood and affect. His behavior is normal. Judgment and thought content normal.  Nursing note and vitals reviewed.   BP 159/67 mmHg  Pulse 66  Temp(Src) 96.8 F (36 C) (Oral)  Ht 5\' 8"  (1.727 m)  Wt 164 lb (74.39 kg)  BMI 24.94 kg/m2       Assessment & Plan:  1. Testosterone deficiency -The patient does complain of some decreased energy and will be no change in this treatment.  2. Coronary artery disease due to lipid rich plaque -He has seen the cardiologist recently and we'll continue to follow-up with him on a regular basis  3. COPD with asthma -His breathing is stable today and no changes in treatment will be recommended  4. ASCVD (arteriosclerotic cardiovascular disease) -  He recently saw the cardiologist and he felt that things were stable with his heart -His rhythm today was regular at 60/m  5. CLL (chronic lymphocytic leukemia) -He just saw the hematologist at [facility] and brings back lab work for review which will be scanned into the record  6. Hypertension -The blood pressure is elevated today on the systolic side and the patient will monitor his readings at home and bring readings by for review with a check by the nurse and a couple weeks  7. Hyperlipemia -The patient should continue with his current treatment and is much physical activity as tolerated  8. High risk medication use  9. Vitamin D deficiency -The patient should continue with his vitamin D treatment  10. Tendinopathy -We will arrange for the patient to see the orthopedic specialist in [facility] to further evaluate his left foot pain and knots on the  plantar tendons - Ambulatory referral to Orthopedic Surgery  Meds ordered this encounter  Medications  . traMADol (ULTRAM) 50 MG tablet    Sig: TAKE 1 TABLET UP TO 2 TIMES A DAY    Dispense:  60 tablet    Refill:  0   Patient Instructions                       Medicare Annual Wellness Visit   and the medical providers at [facility] strive to bring you the best medical care.  In doing so we not only want to address your current medical conditions and concerns but also to detect new conditions early and prevent illness, disease and health-related problems.    Medicare offers a yearly Wellness Visit which allows our clinical staff to assess your need for preventative services including immunizations, lifestyle education, counseling to decrease risk of preventable diseases and screening for fall risk and other medical concerns.    This visit is provided free of charge (no copay) for all Medicare recipients. The clinical pharmacists at [facility] have begun to conduct these Wellness Visits which will also include a thorough review of all your medications.    As you primary medical provider recommend that you make an appointment for your Annual Wellness Visit if you have not done so already this year.  You may set up this appointment before you leave today or you may call back ([phone]) and schedule an appointment.  Please make sure when you call that you mention that you are scheduling your Annual Wellness Visit with the clinical pharmacist so that the appointment may be made for the proper length of time.     Continue current medications. Continue good therapeutic lifestyle changes which include good diet and exercise. Fall precautions discussed with patient. If an FOBT was given today- please return it to our front desk. If you are over 8 years old - you may need Prevnar 68 or the adult Pneumonia vaccine.  Flu Shots are still  available at our office. If you still haven't had one please call to set up a nurse visit to get one.   After your visit with [place] today you will receive a survey in the mail or online from [employer] regarding your care with [place]. Please take a moment to fill this out. Your feedback is very important to [place] as you can help [place] better understand your patient needs as well as improve your experience and satisfaction. WE CARE ABOUT YOU!!!   We will arrange for you to  see the orthopedic specialist that deals with extremities in [facility] to evaluate the tendinopathy in the left plantar foot. Always stay on top of your breathing and if you develop any increased problems with cough and congestion usually sure inhalers more regularly Drink plenty of fluids Follow-up with cardiology as planned Follow-up with hematology at [facility] as planned Follow-up with urology as planned Please check blood pressures at home and bring these readings by for review with the check by the nurse here in a couple of weeks   Arrie Senate MD

## 2014-07-16 ENCOUNTER — Other Ambulatory Visit: Payer: Self-pay

## 2014-07-16 MED ORDER — TESTOSTERONE CYPIONATE 200 MG/ML IM SOLN: INTRAMUSCULAR | Status: DC

## 2014-07-16 NOTE — Telephone Encounter (Signed)
Last seen 07/14/14 DW[facility]  This med not on EPIC list

## 2014-07-17 ENCOUNTER — Ambulatory Visit (INDEPENDENT_AMBULATORY_CARE_PROVIDER_SITE_OTHER): Payer: Medicare Other | Admitting: *Deleted

## 2014-07-17 DIAGNOSIS — E349 Endocrine disorder, unspecified: Secondary | ICD-10-CM

## 2014-07-17 DIAGNOSIS — E291 Testicular hypofunction: Secondary | ICD-10-CM

## 2014-07-17 MED ORDER — TESTOSTERONE CYPIONATE 200 MG/ML IM SOLN
200.0000 mg | INTRAMUSCULAR | Status: DC
  Administered 2014-07-17: 200 mg via INTRAMUSCULAR

## 2014-07-17 NOTE — Patient Instructions (Signed)

## 2014-07-17 NOTE — Progress Notes (Signed)
Pt given testosterone 200mg /55ml injection IM RUOQ and tolerated well.

## 2014-07-21 ENCOUNTER — Telehealth: Payer: Self-pay

## 2014-07-21 ENCOUNTER — Other Ambulatory Visit: Payer: Medicare Other

## 2014-07-21 DIAGNOSIS — Z1212 Encounter for screening for malignant neoplasm of rectum: Secondary | ICD-10-CM

## 2014-07-21 NOTE — Telephone Encounter (Signed)
Decreased energy, increased fatigue, low testosterone

## 2014-07-21 NOTE — Progress Notes (Signed)
Lab only 

## 2014-07-21 NOTE — Telephone Encounter (Signed)
Insurance denied prior authorization for Testosterone injection     If you'd like me to appeal please give me a reason why

## 2014-07-23 LAB — FECAL OCCULT BLOOD, IMMUNOCHEMICAL: FECAL OCCULT BLD: NEGATIVE

## 2014-08-04 ENCOUNTER — Encounter: Payer: Self-pay | Admitting: *Deleted

## 2014-08-05 ENCOUNTER — Encounter: Payer: Self-pay | Admitting: Physician Assistant

## 2014-08-05 ENCOUNTER — Ambulatory Visit (INDEPENDENT_AMBULATORY_CARE_PROVIDER_SITE_OTHER): Payer: Medicare Other | Admitting: Physician Assistant

## 2014-08-05 VITALS — BP 157/65 | HR 74 | Temp 97.0°F | Ht 68.0 in | Wt 164.8 lb

## 2014-08-05 DIAGNOSIS — R1011 Right upper quadrant pain: Secondary | ICD-10-CM | POA: Diagnosis not present

## 2014-08-05 NOTE — Progress Notes (Signed)
Subjective:     Patient ID: Johnathan Strong, male   DOB: [birth year], 81 y.o.   MRN: [id number]  HPI Pt with a several day hx of RUQ/R sided chest pain He denies any N/V/D He last BM was 3-4 days ago but he has a hx of constipation due to his meds Denies any real cough or congestion He has been using his inhalers as instructed No fever/chills Pt with SOB but this is chronic and his O2 sats have been nl for him Sx are less severe today No change in urinary habits or urine color Hx of renal stones in the 70s  Review of Systems  Constitutional: Negative for fever, chills, activity change, appetite change and fatigue.  HENT: Negative.   Respiratory: Positive for shortness of breath. Negative for cough, choking, chest tightness and wheezing.   Cardiovascular: Negative.   Gastrointestinal: Positive for abdominal pain and constipation. Negative for nausea, vomiting, diarrhea and abdominal distention.  Genitourinary: Positive for flank pain. Negative for dysuria, urgency, hematuria, decreased urine volume and enuresis.       Objective:   Physical Exam  Constitutional: He appears well-developed and well-nourished.  Neck: No JVD present.  Cardiovascular: Normal rate, regular rhythm and normal heart sounds.   Pulmonary/Chest: Effort normal and breath sounds normal. No respiratory distress. He has no wheezes. He has no rales. He exhibits no tenderness.  Abdominal: Soft. Bowel sounds are normal. He exhibits no distension and no mass. There is no tenderness. There is no rebound and no guarding.  Lymphadenopathy:    He has no cervical adenopathy.  Nursing note and vitals reviewed.      Assessment:     RUQ abdominal pain      Plan:     Pt defers CXR today Push fluids OTC Milk of Mag INB return for further eval and W/U

## 2014-08-05 NOTE — Patient Instructions (Signed)

## 2014-08-09 ENCOUNTER — Inpatient Hospital Stay (HOSPITAL_COMMUNITY)
Admission: RE | Admit: 2014-08-09 | Discharge: 2014-08-13 | DRG: 195 | Disposition: A | Discharge status: Home Health | Payer: Medicare Other | Source: Ambulatory Visit | Attending: Internal Medicine | Admitting: Internal Medicine

## 2014-08-09 ENCOUNTER — Encounter (HOSPITAL_COMMUNITY): Payer: Self-pay | Admitting: Internal Medicine

## 2014-08-09 DIAGNOSIS — Z825 Family history of asthma and other chronic lower respiratory diseases: Secondary | ICD-10-CM

## 2014-08-09 DIAGNOSIS — G8929 Other chronic pain: Secondary | ICD-10-CM | POA: Diagnosis present

## 2014-08-09 DIAGNOSIS — I119 Hypertensive heart disease without heart failure: Secondary | ICD-10-CM | POA: Diagnosis present

## 2014-08-09 DIAGNOSIS — G454 Transient global amnesia: Secondary | ICD-10-CM | POA: Diagnosis present

## 2014-08-09 DIAGNOSIS — M549 Dorsalgia, unspecified: Secondary | ICD-10-CM | POA: Diagnosis present

## 2014-08-09 DIAGNOSIS — Z79899 Other long term (current) drug therapy: Secondary | ICD-10-CM

## 2014-08-09 DIAGNOSIS — Z8673 Personal history of transient ischemic attack (TIA), and cerebral infarction without residual deficits: Secondary | ICD-10-CM

## 2014-08-09 DIAGNOSIS — N401 Enlarged prostate with lower urinary tract symptoms: Secondary | ICD-10-CM | POA: Diagnosis present

## 2014-08-09 DIAGNOSIS — Z856 Personal history of leukemia: Secondary | ICD-10-CM

## 2014-08-09 DIAGNOSIS — Z8249 Family history of ischemic heart disease and other diseases of the circulatory system: Secondary | ICD-10-CM

## 2014-08-09 DIAGNOSIS — Z880 Allergy status to penicillin: Secondary | ICD-10-CM

## 2014-08-09 DIAGNOSIS — R079 Chest pain, unspecified: Secondary | ICD-10-CM

## 2014-08-09 DIAGNOSIS — Z7902 Long term (current) use of antithrombotics/antiplatelets: Secondary | ICD-10-CM

## 2014-08-09 DIAGNOSIS — R351 Nocturia: Secondary | ICD-10-CM | POA: Diagnosis present

## 2014-08-09 DIAGNOSIS — M8580 Other specified disorders of bone density and structure, unspecified site: Secondary | ICD-10-CM | POA: Diagnosis present

## 2014-08-09 DIAGNOSIS — I10 Essential (primary) hypertension: Secondary | ICD-10-CM | POA: Diagnosis present

## 2014-08-09 DIAGNOSIS — J449 Chronic obstructive pulmonary disease, unspecified: Secondary | ICD-10-CM | POA: Diagnosis present

## 2014-08-09 DIAGNOSIS — R0609 Other forms of dyspnea: Secondary | ICD-10-CM | POA: Diagnosis present

## 2014-08-09 DIAGNOSIS — E785 Hyperlipidemia, unspecified: Secondary | ICD-10-CM | POA: Diagnosis present

## 2014-08-09 DIAGNOSIS — Z906 Acquired absence of other parts of urinary tract: Secondary | ICD-10-CM | POA: Diagnosis present

## 2014-08-09 DIAGNOSIS — R350 Frequency of micturition: Secondary | ICD-10-CM | POA: Diagnosis present

## 2014-08-09 DIAGNOSIS — I2510 Atherosclerotic heart disease of native coronary artery without angina pectoris: Secondary | ICD-10-CM | POA: Diagnosis present

## 2014-08-09 DIAGNOSIS — K219 Gastro-esophageal reflux disease without esophagitis: Secondary | ICD-10-CM | POA: Diagnosis present

## 2014-08-09 DIAGNOSIS — K5900 Constipation, unspecified: Secondary | ICD-10-CM | POA: Diagnosis not present

## 2014-08-09 DIAGNOSIS — Z7901 Long term (current) use of anticoagulants: Secondary | ICD-10-CM

## 2014-08-09 DIAGNOSIS — Z955 Presence of coronary angioplasty implant and graft: Secondary | ICD-10-CM

## 2014-08-09 DIAGNOSIS — I480 Paroxysmal atrial fibrillation: Secondary | ICD-10-CM | POA: Diagnosis present

## 2014-08-09 DIAGNOSIS — Z882 Allergy status to sulfonamides status: Secondary | ICD-10-CM

## 2014-08-09 DIAGNOSIS — Z8551 Personal history of malignant neoplasm of bladder: Secondary | ICD-10-CM

## 2014-08-09 DIAGNOSIS — Z886 Allergy status to analgesic agent status: Secondary | ICD-10-CM

## 2014-08-09 DIAGNOSIS — J45909 Unspecified asthma, uncomplicated: Secondary | ICD-10-CM | POA: Diagnosis present

## 2014-08-09 DIAGNOSIS — J189 Pneumonia, unspecified organism: Secondary | ICD-10-CM | POA: Diagnosis not present

## 2014-08-09 DIAGNOSIS — M1990 Unspecified osteoarthritis, unspecified site: Secondary | ICD-10-CM | POA: Diagnosis present

## 2014-08-09 DIAGNOSIS — Z888 Allergy status to other drugs, medicaments and biological substances status: Secondary | ICD-10-CM

## 2014-08-09 DIAGNOSIS — I4891 Unspecified atrial fibrillation: Secondary | ICD-10-CM | POA: Diagnosis present

## 2014-08-09 DIAGNOSIS — Z79891 Long term (current) use of opiate analgesic: Secondary | ICD-10-CM

## 2014-08-09 DIAGNOSIS — Z87891 Personal history of nicotine dependence: Secondary | ICD-10-CM

## 2014-08-09 MED ORDER — APIXABAN 5 MG PO TABS
5.0000 mg | ORAL_TABLET | Freq: Two times a day (BID) | ORAL | Status: DC
  Administered 2014-08-10 – 2014-08-13 (×7): 5 mg via ORAL
  Filled 2014-08-09 (×9): qty 1

## 2014-08-09 MED ORDER — CEFTRIAXONE SODIUM IN DEXTROSE 20 MG/ML IV SOLN
1.0000 g | INTRAVENOUS | Status: DC
  Administered 2014-08-09 – 2014-08-12 (×4): 1 g via INTRAVENOUS
  Filled 2014-08-09 (×5): qty 50

## 2014-08-09 MED ORDER — BUDESONIDE-FORMOTEROL FUMARATE 160-4.5 MCG/ACT IN AERO
2.0000 | INHALATION_SPRAY | Freq: Two times a day (BID) | RESPIRATORY_TRACT | Status: DC
  Administered 2014-08-10 – 2014-08-13 (×7): 2 via RESPIRATORY_TRACT
  Filled 2014-08-09 (×2): qty 6

## 2014-08-09 MED ORDER — FLUTICASONE PROPIONATE 50 MCG/ACT NA SUSP
2.0000 | Freq: Every day | NASAL | Status: DC
  Administered 2014-08-09: 2 via NASAL
  Filled 2014-08-09: qty 16

## 2014-08-09 MED ORDER — LOSARTAN POTASSIUM 50 MG PO TABS
50.0000 mg | ORAL_TABLET | Freq: Two times a day (BID) | ORAL | Status: DC
  Administered 2014-08-10 – 2014-08-13 (×7): 50 mg via ORAL
  Filled 2014-08-09 (×9): qty 1

## 2014-08-09 MED ORDER — TIOTROPIUM BROMIDE MONOHYDRATE 18 MCG IN CAPS
18.0000 ug | ORAL_CAPSULE | Freq: Every day | RESPIRATORY_TRACT | Status: DC
  Administered 2014-08-10 – 2014-08-13 (×4): 18 ug via RESPIRATORY_TRACT
  Filled 2014-08-09: qty 5

## 2014-08-09 MED ORDER — FAMOTIDINE 10 MG PO TABS
10.0000 mg | ORAL_TABLET | Freq: Every day | ORAL | Status: DC
  Administered 2014-08-10 – 2014-08-13 (×4): 10 mg via ORAL
  Filled 2014-08-09 (×4): qty 1

## 2014-08-09 MED ORDER — ALBUTEROL SULFATE (2.5 MG/3ML) 0.083% IN NEBU
2.5000 mg | INHALATION_SOLUTION | Freq: Four times a day (QID) | RESPIRATORY_TRACT | Status: DC | PRN
  Administered 2014-08-10: 2.5 mg via RESPIRATORY_TRACT
  Filled 2014-08-09 (×2): qty 3

## 2014-08-09 MED ORDER — ATORVASTATIN CALCIUM 10 MG PO TABS
10.0000 mg | ORAL_TABLET | Freq: Every day | ORAL | Status: DC
  Administered 2014-08-10 – 2014-08-13 (×4): 10 mg via ORAL
  Filled 2014-08-09 (×4): qty 1

## 2014-08-09 MED ORDER — METOPROLOL TARTRATE 25 MG PO TABS
25.0000 mg | ORAL_TABLET | Freq: Two times a day (BID) | ORAL | Status: DC
  Administered 2014-08-10 – 2014-08-13 (×7): 25 mg via ORAL
  Filled 2014-08-09 (×9): qty 1

## 2014-08-09 MED ORDER — DIPHENHYDRAMINE HCL 25 MG PO CAPS: 25.0000 mg | ORAL_CAPSULE | Freq: Every evening | ORAL | Status: DC | PRN

## 2014-08-09 MED ORDER — SODIUM CHLORIDE 0.9 % IV SOLN
INTRAVENOUS | Status: AC
  Administered 2014-08-09 – 2014-08-10 (×2): via INTRAVENOUS

## 2014-08-09 MED ORDER — OMEGA-3-ACID ETHYL ESTERS 1 G PO CAPS
1.0000 g | ORAL_CAPSULE | Freq: Every day | ORAL | Status: DC
  Administered 2014-08-10 – 2014-08-13 (×4): 1 g via ORAL
  Filled 2014-08-09 (×4): qty 1

## 2014-08-09 MED ORDER — DIPHENHYDRAMINE-APAP (SLEEP) 25-500 MG PO TABS: 1.0000 | ORAL_TABLET | Freq: Every evening | ORAL | Status: DC | PRN

## 2014-08-09 MED ORDER — VITAMIN D3 25 MCG (1000 UT) PO CAPS: 1.0000 | ORAL_CAPSULE | Freq: Every day | ORAL | Status: DC

## 2014-08-09 MED ORDER — AZELASTINE HCL 0.1 % NA SOLN: 1.0000 | NASAL | Status: DC | PRN

## 2014-08-09 MED ORDER — ACETAMINOPHEN 500 MG PO TABS
500.0000 mg | ORAL_TABLET | Freq: Every evening | ORAL | Status: DC | PRN
  Administered 2014-08-10 – 2014-08-12 (×3): 500 mg via ORAL
  Filled 2014-08-09 (×3): qty 1

## 2014-08-09 MED ORDER — ALBUTEROL SULFATE HFA 108 (90 BASE) MCG/ACT IN AERS: 2.0000 | INHALATION_SPRAY | Freq: Four times a day (QID) | RESPIRATORY_TRACT | Status: DC | PRN

## 2014-08-09 MED ORDER — DEXTROSE 5 % IV SOLN
500.0000 mg | INTRAVENOUS | Status: DC
  Administered 2014-08-10 – 2014-08-12 (×4): 500 mg via INTRAVENOUS
  Filled 2014-08-09 (×5): qty 500

## 2014-08-09 MED ORDER — NITROGLYCERIN 0.4 MG SL SUBL: 0.4000 mg | SUBLINGUAL_TABLET | SUBLINGUAL | Status: DC | PRN

## 2014-08-09 MED ORDER — ZOLPIDEM TARTRATE 5 MG PO TABS
5.0000 mg | ORAL_TABLET | Freq: Every evening | ORAL | Status: DC | PRN
  Administered 2014-08-10 – 2014-08-12 (×3): 5 mg via ORAL
  Filled 2014-08-09 (×3): qty 1

## 2014-08-09 MED ORDER — VITAMIN D3 25 MCG (1000 UNIT) PO TABS
1000.0000 [IU] | ORAL_TABLET | Freq: Every day | ORAL | Status: DC
  Administered 2014-08-10 – 2014-08-13 (×4): 1000 [IU] via ORAL
  Filled 2014-08-09 (×4): qty 1

## 2014-08-09 MED ORDER — TRAMADOL HCL 50 MG PO TABS
50.0000 mg | ORAL_TABLET | Freq: Two times a day (BID) | ORAL | Status: DC | PRN
  Administered 2014-08-10: 50 mg via ORAL
  Filled 2014-08-09 (×2): qty 1

## 2014-08-09 MED ORDER — MONTELUKAST SODIUM 10 MG PO TABS
10.0000 mg | ORAL_TABLET | Freq: Every day | ORAL | Status: DC
  Administered 2014-08-09 – 2014-08-12 (×4): 10 mg via ORAL
  Filled 2014-08-09 (×5): qty 1

## 2014-08-09 MED ORDER — CLOPIDOGREL BISULFATE 75 MG PO TABS
75.0000 mg | ORAL_TABLET | Freq: Every day | ORAL | Status: DC
  Administered 2014-08-10 – 2014-08-13 (×4): 75 mg via ORAL
  Filled 2014-08-09 (×4): qty 1

## 2014-08-09 MED ORDER — DILTIAZEM HCL ER COATED BEADS 120 MG PO CP24
120.0000 mg | ORAL_CAPSULE | Freq: Every day | ORAL | Status: DC
  Administered 2014-08-10 – 2014-08-11 (×2): 120 mg via ORAL
  Filled 2014-08-09 (×2): qty 1

## 2014-08-09 NOTE — Progress Notes (Signed)
ANTIBIOTIC CONSULT NOTE - Renal Dose Adjustment  Pharmacy Consult for Renal Review Indication:  IV Antibiotics  Patient Measurements: Height: 5\' 8"  (172.7 cm) Weight: 162 lb (73.483 kg) IBW/kg (Calculated) : 68.4  Microbiology: Recent Results (from the past 720 hour(s))  Fecal occult blood, imunochemical     Status: None   Collection Time: 07/21/14 12:14 PM  Result Value Ref Range Status   Fecal Occult Bld Negative Negative Final    Medications:  Anti-infectives    Start     Dose/Rate Route Frequency Ordered Stop   08/09/14 2100  cefTRIAXone (ROCEPHIN) 1 g in dextrose 5 % 50 mL IVPB - Premix     1 g 100 mL/hr over 30 Minutes Intravenous Every 24 hours 08/09/14 2050 08/16/14 2059   08/09/14 2100  azithromycin (ZITHROMAX) 500 mg in dextrose 5 % 250 mL IVPB     500 mg 250 mL/hr over 60 Minutes Intravenous Every 24 hours 08/09/14 2050 08/16/14 2059     Assessment: 81 yo male with multiple co-morbidities and is currently receiving IV Ceftriaxone 1gm every 24 hours and Azithromycin 500mg  every 24 hours for possible pneumonia.  We have been asked to review therapy and adjust for renal function as needed.  He currently has a creatinine of 1.0 with an estimated crcl of 30ml/min.  Plan:  - No dose adjustments required - Please let us know if we can assist further in the care of this patient  Rober Minion, PharmD., MS Clinical Pharmacist Pager:  [phone] Thank you for allowing pharmacy to be part of this patients care team. 08/09/2014,8:53 PM

## 2014-08-09 NOTE — H&P (Addendum)
Johnathan Strong is an 81 y.o. male.    Dr. Hazle Strong (pcp, [place], [place]) Johnathan Strong (cardiologist)  Chief Complaint: dyspnea HPI: 81 yo male with hx of Copd, Pafib (CHADS2= 3), c/o dyspnea for the past 1 week.  Pt was seen as outpatient today and sent for admission for pneumonia, and atrial fibrillation. + chills,  Pt denies fever, cough, palp, n/v, diarrhea, brbpr, black stool.  Pt noted right sided pain with cough.  .  Pt will be admitted for pneumonia, afib.   Past Medical History  Diagnosis Date  . Osteopenia 2006  . Hyperlipidemia   . BPH (benign prostatic hypertrophy)   . Transient global amnesia   . Osteoarthritis   . History of TIA (transient ischemic attack) 1980'S    NO RESIDUAL  . COPD (chronic obstructive pulmonary disease) with emphysema   . Chronic back pain   . Itching SECONDARY TO CLL-- CONTROLLED W/ SINGULAIR  . Frequency   . Nocturia   . Hypertension CARDIOLOGIST- DR BRACKBILL-- LAST VISIT 12-15-2010 NOTE IN EPIC  . CLL (chronic lymphocytic leukemia) LAST PLT COUNT APRL 2012  168    Managed at [facility]  . Bladder cancer   . Aspirin allergy     Eyes swell  . Asthma   . Dyspnea on exertion   . CAD (coronary artery disease)     a. 08/2012 Cath: LM nl, LAD 90p, LCX 20-30, RCA 100ost fills via L->R collats, EF 55-65%;  b. 09/2012 PCI of prox LAD with 3.0x16 Promus DES.    Past Surgical History  Procedure Laterality Date  . Excisional hemorrhoidectomy  1980  . Cardiovascular stress test  07/2005    a. 07/2005- no reversible ischemia, normal EF b. 06/2009- EF 73%, no reversible ischemia, inferolateral TWIs at rest, upright in recovery c. 08/2012- mediuim-sized partially reversible basal to mid inferior and inferoseptal perfusion defect c/w with prior infarction and peri-infarct ischemia; EF 68% and borderline ST/T changes on stress ECG   . Transurethral resection of bladder tumor  10-19-2008    AND DILATION URETHRAL STRICTURE  . Cataract extraction w/  intraocular lens  implant, bilateral Bilateral ~ 2007  . Transthoracic echocardiogram  01-03-2011    LVEF 53-66%, grade 1 diastolic dysfunction, no WMAs or structural abnormalities  . Cystoscopy with biopsy  10/30/2011    Procedure: CYSTOSCOPY WITH BIOPSY;  Surgeon: Johnathan Jabs, MD;  Location: [facility];  Service: Urology;  Laterality: N/A;  . Cardiac catheterization  08/27/2012  . Coronary angioplasty with stent placement  09/26/2012    "1" (09/26/2012)  . Coronary stent placement  09/26/12  . Left heart catheterization with coronary angiogram N/A 08/27/2012    Procedure: LEFT HEART CATHETERIZATION WITH CORONARY ANGIOGRAM;  Surgeon: Johnathan M Martinique, MD;  Location: [facility] CATH LAB;  Service: Cardiovascular;  Laterality: N/A;  . Percutaneous coronary stent intervention (pci-s) N/A 09/26/2012    Procedure: PERCUTANEOUS CORONARY STENT INTERVENTION (PCI-S);  Surgeon: Johnathan M Martinique, MD;  Location: [facility] CATH LAB;  Service: Cardiovascular;  Laterality: N/A;    Family History  Problem Relation Age of Onset  . Emphysema Father   . Heart disease Father   . COPD Father   . Atrial fibrillation Father   . Kidney cancer Mother   . Kidney failure Mother    Social History:  reports that he quit smoking about 54 years ago. His smoking use included Cigarettes. He has a 25 pack-year smoking history. He quit smokeless tobacco  use about 35 years ago. He reports that he drinks alcohol. He reports that he does not use illicit drugs.  Allergies:  Allergies  Allergen Reactions  . Aspirin Anaphylaxis, Shortness Of Breath and Swelling  . Nsaids Anaphylaxis, Shortness Of Breath and Swelling    Raise BP  . Septra [Sulfamethoxazole-Trimethoprim] Rash  . Albuterol     Can not tolerate high/ frequent doses due to Blum with using HFA PRN  . Benicar [Olmesartan Medoxomil]     dizzy  . Salicylates     SOB  . Ampicillin Rash    Facility-administered medications prior to admission  Medication  Dose Route Frequency Provider Last Rate Last Dose  . testosterone cypionate (DEPOTESTOTERONE CYPIONATE) injection 200 mg  200 mg Intramuscular Q28 days Johnathan Herb, MD   200 mg at 07/17/14 1130   Medications Prior to Admission  Medication Sig Dispense Refill  . albuterol (VENTOLIN HFA) 108 (90 BASE) MCG/ACT inhaler Inhale 2 puffs into the lungs every 6 (six) hours as needed for wheezing.     [name] apixaban (ELIQUIS) 5 MG TABS tablet Take 1 tablet (5 mg total) by mouth 2 (two) times daily. 180 tablet 0  . Azelastine HCl (ASTELIN NA) Place 1 spray into the nose as needed (for congestion).     . Cholecalciferol (VITAMIN D3) 1000 UNITS CAPS Take 1 capsule by mouth daily.     . clopidogrel (PLAVIX) 75 MG tablet TAKE 1 TABLET ONCE A DAY 90 tablet 1  . diltiazem (CARDIZEM CD) 120 MG 24 hr capsule Take 1 capsule (120 mg total) by mouth daily. 90 capsule 0  . diphenhydramine-acetaminophen (TYLENOL PM) 25-500 MG TABS Take 1 tablet by mouth at bedtime as needed. sleep    . fluticasone (FLONASE) 50 MCG/ACT nasal spray USE 2 SPRAYS INTO EACH NOSTRIL ONCE DAILY (Patient taking differently: USE 2 SPRAYS INTO EACH NOSTRIL ONCE DAILY as needed for allergies.) 16 g 2  . LIPITOR 10 MG tablet TAKE 1 TABLET DAILY (Patient taking differently: TAKE 1 TABLET DAILY in the evening.) 90 tablet 1  . losartan (COZAAR) 50 MG tablet TAKE (1) TABLET TWICE A DAY. 180 tablet 1  . metoprolol tartrate (LOPRESSOR) 25 MG tablet Take 1 tablet (25 mg total) by mouth 2 (two) times daily. 180 tablet 0  . montelukast (SINGULAIR) 10 MG tablet TAKE ONE TABLET AT BEDTIME 90 tablet 2  . Omega-3 Fatty Acids (FISH OIL) 1200 MG CAPS Take 1 capsule by mouth daily.     [name] SPIRIVA HANDIHALER 18 MCG inhalation capsule INHALE THE CONTENTS OF ONE CAPSULE ONCE DAILY AS DIRECTED 30 capsule 4  . SYMBICORT 160-4.5 MCG/ACT inhaler 2 PUFFS EVERY 12 HOURS 10.2 g 3  . traMADol (ULTRAM) 50 MG tablet TAKE 1 TABLET UP TO 2 TIMES A DAY 60 tablet 0  . triamcinolone  cream (KENALOG) 0.1 % APPLY TO AFFECTED AREAS 2 TIMES A DAY AS NEEDED 45 g 3  . cimetidine (TAGAMET) 300 MG tablet TAKE 1 TABLET UP TO 4 TIMES A DAY (Patient taking differently: TAKE 1 TABLET UP TO 4 TIMES A DAY as needed) 40 tablet 11  . furosemide (LASIX) 20 MG tablet TAKE (1) TABLET DAILY AS DIRECTED. (Patient taking differently: TAKE (1) TABLET DAILY AS DIRECTED as needed for swelling) 30 tablet 2  . nitroGLYCERIN (NITROSTAT) 0.4 MG SL tablet Place 1 tablet (0.4 mg total) under the tongue every 5 (five) minutes as needed for chest pain. 25 tablet 3  . testosterone cypionate (  DEPO-TESTOSTERONE) 200 MG/ML injection Inject 1 ml intramuscularly monthly 10 mL 0    No results found for this or any previous visit (from the past 48 hour(s)). No results found.  Review of Systems  Constitutional: Positive for chills. Negative for fever, weight loss, malaise/fatigue and diaphoresis.  HENT: Negative.   Eyes: Negative.   Respiratory: Positive for cough and shortness of breath. Negative for hemoptysis, sputum production and wheezing.   Cardiovascular: Positive for chest pain. Negative for palpitations, orthopnea, claudication, leg swelling and PND.  Gastrointestinal: Negative.   Genitourinary: Negative.   Skin: Negative for itching and rash.  Neurological: Negative.  Negative for weakness.  Endo/Heme/Allergies: Negative.   Psychiatric/Behavioral: Negative.     Blood pressure 120/98, pulse 100, temperature 97.5 F (36.4 C), temperature source Oral, resp. rate 18, height 5\' 8"  (1.727 m), weight 73.483 kg (162 lb), SpO2 99 %. Physical Exam  Constitutional: He is oriented to person, place, and time. He appears well-developed and well-nourished.  HENT:  Head: Normocephalic and atraumatic.  Eyes: Conjunctivae and EOM are normal. Pupils are equal, round, and reactive to light. No scleral icterus.  Neck: Normal range of motion. Neck supple. No JVD present. No tracheal deviation present. No thyromegaly  present.  Cardiovascular: Exam reveals no gallop and no friction rub.   No murmur heard. Irr, irr, s1, s2  Respiratory: Effort normal and breath sounds normal. No respiratory distress. He has no wheezes. He has no rales.  GI: Soft. Bowel sounds are normal. He exhibits no distension. There is no tenderness. There is no rebound and no guarding.  Musculoskeletal: Normal range of motion. He exhibits no edema or tenderness.  Lymphadenopathy:    He has no cervical adenopathy.  Neurological: He is alert and oriented to person, place, and time. He has normal reflexes. He displays normal reflexes. No cranial nerve deficit. He exhibits normal muscle tone. Coordination normal.  Skin: Skin is warm and dry. No rash noted. No erythema. No pallor.  Psychiatric: He has a normal mood and affect. His behavior is normal. Judgment and thought content normal.     Assessment/Plan CAP Check blood culture x2, cbc, cmp Check urine legionellla antigen tx with rocephin and zithromax,  Warned about 10 % cross reactivity of rash possibly due to pcn allergy.   Pafib   Cont eliquis  Copd Cont current inhalers  CAD Cont current medications.   DVT prophylaxis:  Scd,  eliquis   Jani Gravel 08/09/2014, 7:24 PM

## 2014-08-10 ENCOUNTER — Ambulatory Visit: Payer: Medicare Other | Admitting: Cardiology

## 2014-08-10 ENCOUNTER — Observation Stay (HOSPITAL_COMMUNITY): Payer: Medicare Other

## 2014-08-10 DIAGNOSIS — Z882 Allergy status to sulfonamides status: Secondary | ICD-10-CM | POA: Diagnosis not present

## 2014-08-10 DIAGNOSIS — K5900 Constipation, unspecified: Secondary | ICD-10-CM | POA: Diagnosis not present

## 2014-08-10 DIAGNOSIS — Z87891 Personal history of nicotine dependence: Secondary | ICD-10-CM | POA: Diagnosis not present

## 2014-08-10 DIAGNOSIS — R350 Frequency of micturition: Secondary | ICD-10-CM | POA: Diagnosis present

## 2014-08-10 DIAGNOSIS — E785 Hyperlipidemia, unspecified: Secondary | ICD-10-CM | POA: Diagnosis present

## 2014-08-10 DIAGNOSIS — Z856 Personal history of leukemia: Secondary | ICD-10-CM | POA: Diagnosis not present

## 2014-08-10 DIAGNOSIS — Z8249 Family history of ischemic heart disease and other diseases of the circulatory system: Secondary | ICD-10-CM | POA: Diagnosis not present

## 2014-08-10 DIAGNOSIS — K219 Gastro-esophageal reflux disease without esophagitis: Secondary | ICD-10-CM | POA: Diagnosis present

## 2014-08-10 DIAGNOSIS — I480 Paroxysmal atrial fibrillation: Secondary | ICD-10-CM | POA: Diagnosis present

## 2014-08-10 DIAGNOSIS — Z8551 Personal history of malignant neoplasm of bladder: Secondary | ICD-10-CM | POA: Diagnosis not present

## 2014-08-10 DIAGNOSIS — I4891 Unspecified atrial fibrillation: Secondary | ICD-10-CM | POA: Diagnosis not present

## 2014-08-10 DIAGNOSIS — J45909 Unspecified asthma, uncomplicated: Secondary | ICD-10-CM | POA: Diagnosis present

## 2014-08-10 DIAGNOSIS — Z886 Allergy status to analgesic agent status: Secondary | ICD-10-CM | POA: Diagnosis not present

## 2014-08-10 DIAGNOSIS — J42 Unspecified chronic bronchitis: Secondary | ICD-10-CM | POA: Diagnosis not present

## 2014-08-10 DIAGNOSIS — Z888 Allergy status to other drugs, medicaments and biological substances status: Secondary | ICD-10-CM | POA: Diagnosis not present

## 2014-08-10 DIAGNOSIS — Z880 Allergy status to penicillin: Secondary | ICD-10-CM | POA: Diagnosis not present

## 2014-08-10 DIAGNOSIS — Z79891 Long term (current) use of opiate analgesic: Secondary | ICD-10-CM | POA: Diagnosis not present

## 2014-08-10 DIAGNOSIS — I119 Hypertensive heart disease without heart failure: Secondary | ICD-10-CM | POA: Diagnosis not present

## 2014-08-10 DIAGNOSIS — R0609 Other forms of dyspnea: Secondary | ICD-10-CM | POA: Diagnosis present

## 2014-08-10 DIAGNOSIS — M549 Dorsalgia, unspecified: Secondary | ICD-10-CM | POA: Diagnosis present

## 2014-08-10 DIAGNOSIS — Z7901 Long term (current) use of anticoagulants: Secondary | ICD-10-CM | POA: Diagnosis not present

## 2014-08-10 DIAGNOSIS — Z7902 Long term (current) use of antithrombotics/antiplatelets: Secondary | ICD-10-CM | POA: Diagnosis not present

## 2014-08-10 DIAGNOSIS — Z79899 Other long term (current) drug therapy: Secondary | ICD-10-CM | POA: Diagnosis not present

## 2014-08-10 DIAGNOSIS — G454 Transient global amnesia: Secondary | ICD-10-CM | POA: Diagnosis present

## 2014-08-10 DIAGNOSIS — Z906 Acquired absence of other parts of urinary tract: Secondary | ICD-10-CM | POA: Diagnosis present

## 2014-08-10 DIAGNOSIS — M8580 Other specified disorders of bone density and structure, unspecified site: Secondary | ICD-10-CM | POA: Diagnosis present

## 2014-08-10 DIAGNOSIS — I2510 Atherosclerotic heart disease of native coronary artery without angina pectoris: Secondary | ICD-10-CM

## 2014-08-10 DIAGNOSIS — J189 Pneumonia, unspecified organism: Secondary | ICD-10-CM | POA: Diagnosis present

## 2014-08-10 DIAGNOSIS — Z955 Presence of coronary angioplasty implant and graft: Secondary | ICD-10-CM | POA: Diagnosis not present

## 2014-08-10 DIAGNOSIS — J449 Chronic obstructive pulmonary disease, unspecified: Secondary | ICD-10-CM | POA: Diagnosis present

## 2014-08-10 DIAGNOSIS — G8929 Other chronic pain: Secondary | ICD-10-CM | POA: Diagnosis present

## 2014-08-10 DIAGNOSIS — Z825 Family history of asthma and other chronic lower respiratory diseases: Secondary | ICD-10-CM | POA: Diagnosis not present

## 2014-08-10 DIAGNOSIS — J438 Other emphysema: Secondary | ICD-10-CM | POA: Diagnosis not present

## 2014-08-10 DIAGNOSIS — R351 Nocturia: Secondary | ICD-10-CM | POA: Diagnosis present

## 2014-08-10 DIAGNOSIS — Z8673 Personal history of transient ischemic attack (TIA), and cerebral infarction without residual deficits: Secondary | ICD-10-CM | POA: Diagnosis not present

## 2014-08-10 DIAGNOSIS — N401 Enlarged prostate with lower urinary tract symptoms: Secondary | ICD-10-CM | POA: Diagnosis present

## 2014-08-10 DIAGNOSIS — M1990 Unspecified osteoarthritis, unspecified site: Secondary | ICD-10-CM | POA: Diagnosis present

## 2014-08-10 DIAGNOSIS — I10 Essential (primary) hypertension: Secondary | ICD-10-CM | POA: Diagnosis present

## 2014-08-10 LAB — COMPREHENSIVE METABOLIC PANEL
ALT: 58 U/L (ref 17–63)
AST: 44 U/L — ABNORMAL HIGH (ref 15–41)
Albumin: 3 g/dL — ABNORMAL LOW (ref 3.5–5.0)
Alkaline Phosphatase: 66 U/L (ref 38–126)
Anion gap: 8 (ref 5–15)
BILIRUBIN TOTAL: 0.7 mg/dL (ref 0.3–1.2)
BUN: 13 mg/dL (ref 6–20)
CO2: 27 mmol/L (ref 22–32)
Calcium: 8.2 mg/dL — ABNORMAL LOW (ref 8.9–10.3)
Chloride: 101 mmol/L (ref 101–111)
Creatinine, Ser: 1.02 mg/dL (ref 0.61–1.24)
GFR calc Af Amer: >60 mL/min (ref >60)
GFR calc non Af Amer: >60 mL/min (ref >60)
Glucose, Bld: 107 mg/dL — ABNORMAL HIGH (ref 65–99)
POTASSIUM: 4.2 mmol/L (ref 3.5–5.1)
Sodium: 136 mmol/L (ref 135–145)
Total Protein: 5.5 g/dL — ABNORMAL LOW (ref 6.5–8.1)

## 2014-08-10 LAB — CBC
HCT: 32 % — ABNORMAL LOW (ref 39.0–52.0)
Hemoglobin: 10.6 g/dL — ABNORMAL LOW (ref 13.0–17.0)
MCH: 30.7 pg (ref 26.0–34.0)
MCHC: 33.1 g/dL (ref 30.0–36.0)
MCV: 92.8 fL (ref 78.0–100.0)
Platelets: 188 10*3/uL (ref 150–400)
RBC: 3.45 MIL/uL — ABNORMAL LOW (ref 4.22–5.81)
RDW: 21.1 % — ABNORMAL HIGH (ref 11.5–15.5)
WBC: 13.2 10*3/uL — ABNORMAL HIGH (ref 4.0–10.5)

## 2014-08-10 LAB — STREP PNEUMONIAE URINARY ANTIGEN: Strep Pneumo Urinary Antigen: NEGATIVE

## 2014-08-10 LAB — HIV ANTIBODY (ROUTINE TESTING W REFLEX): HIV Screen 4th Generation wRfx: NONREACTIVE

## 2014-08-10 LAB — TROPONIN I: Troponin I: 0.13 ng/mL — ABNORMAL HIGH (ref <0.031)

## 2014-08-10 MED ORDER — LEVALBUTEROL HCL 0.63 MG/3ML IN NEBU
0.6300 mg | INHALATION_SOLUTION | Freq: Four times a day (QID) | RESPIRATORY_TRACT | Status: DC
  Administered 2014-08-10 (×2): 0.63 mg via RESPIRATORY_TRACT
  Filled 2014-08-10 (×2): qty 3

## 2014-08-10 MED ORDER — LEVALBUTEROL HCL 0.63 MG/3ML IN NEBU
0.6300 mg | INHALATION_SOLUTION | Freq: Three times a day (TID) | RESPIRATORY_TRACT | Status: DC
  Administered 2014-08-11 – 2014-08-13 (×5): 0.63 mg via RESPIRATORY_TRACT
  Filled 2014-08-10 (×14): qty 3

## 2014-08-10 NOTE — Progress Notes (Signed)
PROGRESS NOTE  MANASES ETCHISON SAY:[id number] DOB: [birth year] DOA: 08/09/2014 PCP: Redge Gainer, MD   HPI: [name] is an 81 y.o. male with a history of COPD who presented to the ED on 08/09/2014 with right-sided chest pain and shortness of breath after being seen at an outpatient clinic where he was told he had pneumonia and needed to be seen at the hospital.    Subjective / 24 H Interval events - feels breathing is not improving much today - chest pain with deep breath - complains of new onset back pain today, he thinks he "tweaked something getting back in bed" - ambulating without assistance - denies wheezing, chills, N/V/D, palpitations, cough   Assessment/Plan: Active Problems:   COPD GOLD III   Hypertension   DOE (dyspnea on exertion)   CAD (coronary artery disease)   Hyperlipemia   Atrial fibrillation   Paroxysmal atrial fibrillation   Pneumonia   Community Acquired Pneumonia - as evidenced by symptoms plus CXR with possible right lower pneumonia - continues to feel dyspneic, - afebrile, T 21F - continue ceftriaxone, azithromycin  COPD gold III - continue Symbicort, Spiriva, levalbuterol, montelukast - no wheezing, hold steroids  Atrial Fibrillation - telemetry, currently in sinus - continue apixaban, clopidrogel, diltiazem  HTN - continue losartan, metoprolol tartrate  CAD - most recent cath 2014 with DES stenting of his LAD on 09/26/12 - needs to be on life long Plavix, continue  HLD - continue atorvastatin  GERD - continue famotidine,    Diet: Diet Heart Room service appropriate?: Yes; Fluid consistency:: Thin Fluids: None DVT Prophylaxis: SCDs  Code Status: Full Code Family Communication: None Disposition Plan: Home when ready  Consultants:  Cardiology  Procedures:  None   Antibiotics Ceftriaxone 6/5 >> Azithromycin 6/5 >>   Studies  Dg Chest 2 View  08/10/2014   CLINICAL DATA:  Pneumonia, shortness of breath.  EXAM: CHEST  2  VIEW  COMPARISON:  PA and lateral chest x-ray of August 09, 2014  FINDINGS: The left lung is well-expanded. On the right there is persistent pleural fluid and increased basilar parenchymal density. The heart and pulmonary vascularity are normal. The mediastinum is normal in width. There is calcification in the wall of the thoracic aorta. There is mild gaseous distention of bowel under the left hemidiaphragm. The bony thorax exhibits no acute abnormality.  IMPRESSION: Persistent right lower lobe atelectasis or pneumonia and small right pleural effusion. There is likely underlying COPD. There has been interval development of minimal left basilar subsegmental atelectasis.   Electronically Signed   By: David  Martinique M.D.   On: 08/10/2014 07:53    Objective  Filed Vitals:   08/09/14 1856 08/09/14 2000 08/10/14 0351  BP: 120/98 131/51 149/50  Pulse: 100 65 69  Temp: 97.5 F (36.4 C) 97.7 F (36.5 C) 98.9 F (37.2 C)  TempSrc: Oral Oral Oral  Resp: 18 18 18   Height: 5\' 8"  (1.727 m)    Weight: 73.483 kg (162 lb)    SpO2: 99%  94%    Intake/Output Summary (Last 24 hours) at 08/10/14 1201 Last data filed at 08/10/14 0900  Gross per 24 hour  Intake    240 ml  Output    201 ml  Net     39 ml   Filed Weights   08/09/14 1856  Weight: 73.483 kg (162 lb)   Exam:  General:  NAD, standing without assistance, sat on bed for physical exam  HEENT: no scleral icterus,  normocephalic  Cardiovascular: RRR without MRG, 2+ peripheral pulses, no edema  Respiratory: decreased breath sounds biL with crackles in RLL, no wheezing   Abdomen: soft, non tender, BS +, no guarding  MSK/Extremities: no clubbing/cyanosis, no joint swelling, tenderness to palpation at  T1-3 R costovertebrals  Skin: no rashes  Neuro: non focal, grossly intact  Data Reviewed: Basic Metabolic Panel:  Recent Labs Lab 08/10/14 0658  NA 136  K 4.2  CL 101  CO2 27  GLUCOSE 107*  BUN 13  CREATININE 1.02  CALCIUM 8.2*    Liver Function Tests:  Recent Labs Lab 08/10/14 0658  AST 44*  ALT 58  ALKPHOS 66  BILITOT 0.7  PROT 5.5*  ALBUMIN 3.0*   CBC:  Recent Labs Lab 08/10/14 0658  WBC 13.2*  HGB 10.6*  HCT 32.0*  MCV 92.8  PLT 188   BNP (last 3 results)  Recent Labs  04/21/14 1541  BNP 484.0*   Scheduled Meds: . apixaban  5 mg Oral BID  . atorvastatin  10 mg Oral Daily  . azithromycin  500 mg Intravenous Q24H  . budesonide-formoterol  2 puff Inhalation BID  . cefTRIAXone (ROCEPHIN)  IV  1 g Intravenous Q24H  . cholecalciferol  1,000 Units Oral Daily  . clopidogrel  75 mg Oral Daily  . diltiazem  120 mg Oral Daily  . famotidine  10 mg Oral Daily  . fluticasone  2 spray Each Nare Daily  . levalbuterol  0.63 mg Nebulization Q6H  . losartan  50 mg Oral BID  . metoprolol tartrate  25 mg Oral BID  . montelukast  10 mg Oral QHS  . omega-3 acid ethyl esters  1 g Oral Daily  . tiotropium  18 mcg Inhalation Daily   Continuous Infusions:   Emily Filbert, PA-S  Time Spent: 25 minutes  Marzetta Board, MD [facility] Pager [phone]. If 7 PM - 7 AM, please contact night-coverage at [email or web address], password [facility] 08/10/2014, 12:01 PM  LOS: 1 day

## 2014-08-10 NOTE — Discharge Instructions (Signed)

## 2014-08-11 ENCOUNTER — Inpatient Hospital Stay (HOSPITAL_COMMUNITY): Payer: Medicare Other

## 2014-08-11 DIAGNOSIS — I4891 Unspecified atrial fibrillation: Secondary | ICD-10-CM

## 2014-08-11 DIAGNOSIS — I119 Hypertensive heart disease without heart failure: Secondary | ICD-10-CM

## 2014-08-11 LAB — LEGIONELLA ANTIGEN, URINE

## 2014-08-11 LAB — CBC
HCT: 29.1 % — ABNORMAL LOW (ref 39.0–52.0)
Hemoglobin: 9.9 g/dL — ABNORMAL LOW (ref 13.0–17.0)
MCH: 30.9 pg (ref 26.0–34.0)
MCHC: 34 g/dL (ref 30.0–36.0)
MCV: 90.9 fL (ref 78.0–100.0)
PLATELETS: 179 10*3/uL (ref 150–400)
RBC: 3.2 MIL/uL — AB (ref 4.22–5.81)
RDW: 20.9 % — AB (ref 11.5–15.5)
WBC: 11.3 10*3/uL — ABNORMAL HIGH (ref 4.0–10.5)

## 2014-08-11 LAB — BASIC METABOLIC PANEL
Anion gap: 8 (ref 5–15)
BUN: 10 mg/dL (ref 6–20)
CO2: 26 mmol/L (ref 22–32)
Calcium: 8.2 mg/dL — ABNORMAL LOW (ref 8.9–10.3)
Chloride: 102 mmol/L (ref 101–111)
Creatinine, Ser: 1 mg/dL (ref 0.61–1.24)
GFR calc non Af Amer: >60 mL/min (ref >60)
Glucose, Bld: 117 mg/dL — ABNORMAL HIGH (ref 65–99)
POTASSIUM: 4.4 mmol/L (ref 3.5–5.1)
Sodium: 136 mmol/L (ref 135–145)

## 2014-08-11 LAB — TROPONIN I: Troponin I: 0.12 ng/mL — ABNORMAL HIGH (ref <0.031)

## 2014-08-11 MED ORDER — DILTIAZEM HCL 25 MG/5ML IV SOLN
5.0000 mg | Freq: Once | INTRAVENOUS | Status: AC
  Administered 2014-08-11: 5 mg via INTRAVENOUS
  Filled 2014-08-11: qty 5

## 2014-08-11 MED ORDER — DILTIAZEM HCL 100 MG IV SOLR
5.0000 mg/h | INTRAVENOUS | Status: DC
  Administered 2014-08-11: 5 mg/h via INTRAVENOUS
  Filled 2014-08-11: qty 100

## 2014-08-11 MED ORDER — DILTIAZEM LOAD VIA INFUSION
20.0000 mg | Freq: Once | INTRAVENOUS | Status: AC
  Administered 2014-08-11: 20 mg via INTRAVENOUS
  Filled 2014-08-11: qty 20

## 2014-08-11 MED ORDER — DILTIAZEM LOAD VIA INFUSION
20.0000 mg | Freq: Once | INTRAVENOUS | Status: DC
  Filled 2014-08-11: qty 20

## 2014-08-11 MED ORDER — DEXTROSE 5 % IV SOLN
5.0000 mg/h | INTRAVENOUS | Status: AC
  Administered 2014-08-11 – 2014-08-12 (×2): 10 mg/h via INTRAVENOUS
  Filled 2014-08-11 (×2): qty 100

## 2014-08-11 NOTE — Progress Notes (Addendum)
Pt HR sustaining greater than 140, afib.  Pt asymptomatic, VSS.  MD Rathore paged.  Ordered 20mg  IV cardizem bolus.  If HR sustaining >100 following bolus, initiate cardizem gtt at 5mg /hr.  Will continue to monitor pt closely.   Claudette Stapler, RN

## 2014-08-11 NOTE — Progress Notes (Signed)
PROGRESS NOTE  Johnathan Strong UYQ:[id number] DOB: 01/22/1924 DOA: 08/09/2014 PCP: Redge Gainer, MD  HPI: Johnathan Strong is an 81 y.o. male with a history of COPD who presented to the ED on 08/09/2014 with right-sided chest pain and shortness of breath after being seen at an outpatient clinic where he was told he had pneumonia and needed to be seen at the hospital.   Subjective / 24 H Interval events - breathing better - his back is hurting with cough - no abdominal pain, nausea or vomiting  Assessment/Plan: Active Problems:   COPD GOLD III   Hypertension   DOE (dyspnea on exertion)   CAD (coronary artery disease)   Hyperlipemia   Atrial fibrillation   Paroxysmal atrial fibrillation   Pneumonia   Community Acquired Pneumonia - as evidenced by symptoms plus CXR with possible right lower pneumonia - continues to feel subjectively dyspneic this morning, not hypoxic, on room air - continue ceftriaxone, azithromycin  A fib with RVR - developed today 6/7, has been in sinus until this morning - diltiazem 5 mg IV x 1 with improvement in his heart rate - cardiology consulted today  - continue apixaban, clopidrogel, diltiazem - continue telemetry  COPD gold III - continue Symbicort, Spiriva, levalbuterol, montelukast - no wheezing, hold steroids  HTN - continue losartan, metoprolol tartrate, diltiazem  CAD - most recent cath 2014 with DES stenting of his LAD on 09/26/12 - needs to be on life long Plavix, continue - mild troponin elevation, stable, likely demand, no active chest pain  HLD - continue atorvastatin  GERD - continue famotidine   Diet: Diet Heart Room service appropriate?: Yes; Fluid consistency:: Thin Fluids: None DVT Prophylaxis: SCDs   Code Status: Full Code Family Communication: None Disposition Plan: Home when ready  Consultants:  Cardiology  Procedures:  None   Antibiotics Ceftriaxone 6/5 >> Azithromycin 6/5 >>   Studies  Dg Chest 2  View  08/10/2014   CLINICAL DATA:  Pneumonia, shortness of breath.  EXAM: CHEST  2 VIEW  COMPARISON:  PA and lateral chest x-ray of August 09, 2014  FINDINGS: The left lung is well-expanded. On the right there is persistent pleural fluid and increased basilar parenchymal density. The heart and pulmonary vascularity are normal. The mediastinum is normal in width. There is calcification in the wall of the thoracic aorta. There is mild gaseous distention of bowel under the left hemidiaphragm. The bony thorax exhibits no acute abnormality.  IMPRESSION: Persistent right lower lobe atelectasis or pneumonia and small right pleural effusion. There is likely underlying COPD. There has been interval development of minimal left basilar subsegmental atelectasis.   Electronically Signed   By: David  Martinique M.D.   On: 08/10/2014 07:53    Objective  Filed Vitals:   08/10/14 2042 08/11/14 0620 08/11/14 0724 08/11/14 1147  BP:  143/46  136/79  Pulse: 76 61 72 96  Temp:  98.1 F (36.7 C)    TempSrc:  Oral    Resp: 18 18 18 18   Height:      Weight:      SpO2: 94% 94%  95%    Intake/Output Summary (Last 24 hours) at 08/11/14 1336 Last data filed at 08/11/14 0800  Gross per 24 hour  Intake    480 ml  Output      0 ml  Net    480 ml   Filed Weights   08/09/14 1856  Weight: 73.483 kg (162 lb)   Exam:  General:  NAD, standing without assistance, sat on bed for physical exam  HEENT: no scleral icterus, normocephalic  Cardiovascular: RRR without MRG, 2+ peripheral pulses, no edema  Respiratory: decreased breath sounds biL with crackles in RLL, no wheezing   Abdomen: soft, non tender, BS +, no guarding  MSK/Extremities: no clubbing/cyanosis, no joint swelling, tenderness to palpation at  T1-3 R costovertebrals  Skin: no rashes  Neuro: non focal, grossly intact  Data Reviewed: Basic Metabolic Panel:  Recent Labs Lab 08/10/14 0658 08/11/14 0511  NA 136 136  K 4.2 4.4  CL 101 102  CO2 27 26   GLUCOSE 107* 117*  BUN 13 10  CREATININE 1.02 1.00  CALCIUM 8.2* 8.2*   Liver Function Tests:  Recent Labs Lab 08/10/14 0658  AST 44*  ALT 58  ALKPHOS 66  BILITOT 0.7  PROT 5.5*  ALBUMIN 3.0*   CBC:  Recent Labs Lab 08/10/14 0658 08/11/14 0511  WBC 13.2* 11.3*  HGB 10.6* 9.9*  HCT 32.0* 29.1*  MCV 92.8 90.9  PLT 188 179   BNP (last 3 results)  Recent Labs  04/21/14 1541  BNP 484.0*   Scheduled Meds: . apixaban  5 mg Oral BID  . atorvastatin  10 mg Oral Daily  . azithromycin  500 mg Intravenous Q24H  . budesonide-formoterol  2 puff Inhalation BID  . cefTRIAXone (ROCEPHIN)  IV  1 g Intravenous Q24H  . cholecalciferol  1,000 Units Oral Daily  . clopidogrel  75 mg Oral Daily  . diltiazem  120 mg Oral Daily  . famotidine  10 mg Oral Daily  . fluticasone  2 spray Each Nare Daily  . levalbuterol  0.63 mg Nebulization TID  . losartan  50 mg Oral BID  . metoprolol tartrate  25 mg Oral BID  . montelukast  10 mg Oral QHS  . omega-3 acid ethyl esters  1 g Oral Daily  . tiotropium  18 mcg Inhalation Daily   Continuous Infusions:   Marzetta Board, MD [facility] Pager [phone]. If 7 PM - 7 AM, please contact night-coverage at [email or web address], password [facility] 08/11/2014, 1:36 PM  LOS: 2 days

## 2014-08-11 NOTE — Consult Note (Signed)
CARDIOLOGY CONSULT NOTE   Patient ID: Johnathan Strong MRN: [id number], DOB/AGE: Sep 28, 1923   Admit date: 08/09/2014 Date of Consult: 08/11/2014   Primary Physician: Redge Gainer, MD Primary Cardiologist: [name] M.D.  Pt. Profile  81 year old gentleman with known coronary artery disease, COPD, and paroxysmal atrial fibrillation who is admitted with right lower lobe pneumonia.  Problem List  Past Medical History  Diagnosis Date  . Osteopenia 2006  . Hyperlipidemia   . BPH (benign prostatic hypertrophy)   . Transient global amnesia   . Osteoarthritis   . History of TIA (transient ischemic attack) 1980'S    NO RESIDUAL  . COPD (chronic obstructive pulmonary disease) with emphysema   . Chronic back pain   . Itching SECONDARY TO CLL-- CONTROLLED W/ SINGULAIR  . Frequency   . Nocturia   . Hypertension CARDIOLOGIST- DR Olawale Marney-- LAST VISIT 12-15-2010 NOTE IN EPIC  . CLL (chronic lymphocytic leukemia) LAST PLT COUNT APRL 2012  168    Managed at [facility]  . Bladder cancer   . Aspirin allergy     Eyes swell  . Asthma   . Dyspnea on exertion   . CAD (coronary artery disease)     a. 08/2012 Cath: LM nl, LAD 90p, LCX 20-30, RCA 100ost fills via L->R collats, EF 55-65%;  b. 09/2012 PCI of prox LAD with 3.0x16 Promus DES.    Past Surgical History  Procedure Laterality Date  . Excisional hemorrhoidectomy  1980  . Cardiovascular stress test  07/2005    a. 07/2005- no reversible ischemia, normal EF b. 06/2009- EF 73%, no reversible ischemia, inferolateral TWIs at rest, upright in recovery c. 08/2012- mediuim-sized partially reversible basal to mid inferior and inferoseptal perfusion defect c/w with prior infarction and peri-infarct ischemia; EF 68% and borderline ST/T changes on stress ECG   . Transurethral resection of bladder tumor  10-19-2008    AND DILATION URETHRAL STRICTURE  . Cataract extraction w/ intraocular lens  implant, bilateral Bilateral ~ 2007  .  Transthoracic echocardiogram  01-03-2011    LVEF 69-48%, grade 1 diastolic dysfunction, no WMAs or structural abnormalities  . Cystoscopy with biopsy  10/30/2011    Procedure: CYSTOSCOPY WITH BIOPSY;  Surgeon: Claybon Jabs, MD;  Location: [facility];  Service: Urology;  Laterality: N/A;  . Cardiac catheterization  08/27/2012  . Coronary angioplasty with stent placement  09/26/2012    "1" (09/26/2012)  . Coronary stent placement  09/26/12  . Left heart catheterization with coronary angiogram N/A 08/27/2012    Procedure: LEFT HEART CATHETERIZATION WITH CORONARY ANGIOGRAM;  Surgeon: Peter M Martinique, MD;  Location: [facility] CATH LAB;  Service: Cardiovascular;  Laterality: N/A;  . Percutaneous coronary stent intervention (pci-s) N/A 09/26/2012    Procedure: PERCUTANEOUS CORONARY STENT INTERVENTION (PCI-S);  Surgeon: Peter M Martinique, MD;  Location: [facility] CATH LAB;  Service: Cardiovascular;  Laterality: N/A;     Allergies  Allergies  Allergen Reactions  . Aspirin Anaphylaxis, Shortness Of Breath and Swelling  . Nsaids Anaphylaxis, Shortness Of Breath and Swelling    Raise BP  . Septra [Sulfamethoxazole-Trimethoprim] Rash  . Albuterol     Can not tolerate high/ frequent doses due to [place] with using HFA PRN  . Benicar [Olmesartan Medoxomil]     dizzy  . Salicylates     SOB  . 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  He had been sick for about a week with cough and weakness and sweats but no definite fever.  He went to [place] [facility] on 08/[facility]/14 and at that time his lungs were clear to auscultation and he declined chest x-ray.  He continued to feel ill and went to the weekend walk-in clinic and saw Dr. Hazle Quant who noted rales and obtained a chest x-ray which showed a infiltrate at the right base and he was referred here for further treatment. Patient has a history of known  ischemic heart disease.He presented in April 2014 with symptoms of dyspnea on exertion. Subsequent Myoview study showed significant inferior wall ischemia. He underwent cardiac catheterization on 08/27/2012. This demonstrated ostial occlusion of the right coronary with excellent left to right collateral flow. He had a high-grade proximal LAD stenosis. LV function was well-preserved. [name] He subsequently underwent successful DES stenting of his LAD on 09/26/12. He will be on lifelong Plavix. He is allergic to aspirin. 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 He had a P2Y12 assay prior to his stent placement which showed an excellent response of Plavix at 79 PR U The patient was hospitalized at [facility] from February 16 through 04/27/2014. He was admitted with exacerbation of his COPD. While in the hospital he was documented to have several episodes of atrial fibrillation. For this reason he was discharged on Eliquis. He remains on Plavix because of his drug-eluting stent.  On 04/22/14 while in the hospital he had an echocardiogram showing an ejection fraction of 60-65% with grade 1 diastolic dysfunction while in normal sinus rhythm. There was mild mitral regurgitation. There was mild left atrial enlargement. He was seen back in the office for a follow-up visit on 05/14/14 at which time he was back in normal sinus rhythm. He was back in atrial fibrillation when he was admitted this time for his pneumonia.  He is not certain as to when he went back into atrial fibrillation.  He has not had any TIA or stroke symptoms.  He has been faithfully taking his Apixaban twice a day. Inpatient Medications  . apixaban  5 mg Oral BID  . atorvastatin  10 mg Oral Daily  . azithromycin  500 mg Intravenous Q24H  . budesonide-formoterol  2 puff Inhalation BID  . cefTRIAXone (ROCEPHIN)  IV  1 g Intravenous Q24H  . cholecalciferol   1,000 Units Oral Daily  . clopidogrel  75 mg Oral Daily  . diltiazem  120 mg Oral Daily  . famotidine  10 mg Oral Daily  . fluticasone  2 spray Each Nare Daily  . levalbuterol  0.63 mg Nebulization TID  . losartan  50 mg Oral BID  . metoprolol tartrate  25 mg Oral BID  . montelukast  10 mg Oral QHS  . omega-3 acid ethyl esters  1 g Oral Daily  . tiotropium  18 mcg Inhalation Daily    Family History Family History  Problem Relation Age of Onset  . Emphysema Father   . Heart disease Father   . COPD Father   . Atrial fibrillation Father   . Kidney cancer Mother   . Kidney failure Mother      Social History History   Social History  . Marital Status: Married    Spouse Name: N/A  . Number of Children: N/A  . Years of Education: N/A   Occupational History  .  retired Scientist, product/process development    Social History Main Topics  . Smoking status: Former Smoker -- 1.00 packs/day for 25 years    Types: Cigarettes    Quit date: 03/06/1960  . Smokeless tobacco: Former Systems developer    Quit date: 10/25/1978  . Alcohol Use: 0.0 oz/week    0 Standard drinks or equivalent per week     Comment: 09/26/2012 "glass of wine or mixed drink once/month"  . Drug Use: No  . Sexual Activity: No   Other Topics Concern  . Not on file   Social History Narrative     Review of Systems  General:  Positive for recent sweats Cardiovascular:  No recurrent chest pain or angina.  He has been more short of breath. Dermatological: No rash, lesions/masses Respiratory: He has had a nonproductive cough Urologic: No hematuria, dysuria Abdominal:   No nausea, vomiting, diarrhea, bright red blood per rectum, melena, or hematemesis Neurologic:  No visual changes, wkns, changes in mental status.  He has had some low back pain All other systems reviewed and are otherwise negative except as noted above.  Physical Exam  Blood pressure 143/46, pulse 72, temperature 98.1 F (36.7 C), temperature source Oral, resp.  rate 18, height 5\' 8"  (1.727 m), weight 162 lb (73.483 kg), SpO2 94 %.  General: Pleasant, NAD Psych: Normal affect. Neuro: Alert and oriented X 3. Moves all extremities spontaneously. HEENT: Normal  Neck: Supple without bruits or JVD. Lungs:  There are bibasilar rales Heart: Irregularly irregular pulse.  No gallop or murmur or rub. Abdomen: Soft, non-tender, non-distended, BS + x 4.  Extremities: No clubbing, cyanosis or edema. DP/PT/Radials 2+ and equal bilaterally.  Labs   Recent Labs  08/10/14 1617 08/11/14 0511  TROPONINI 0.13* 0.12*   Lab Results  Component Value Date   WBC 11.3* 08/11/2014   HGB 9.9* 08/11/2014   HCT 29.1* 08/11/2014   MCV 90.9 08/11/2014   PLT 179 08/11/2014     Recent Labs Lab 08/10/14 0658 08/11/14 0511  NA 136 136  K 4.2 4.4  CL 101 102  CO2 27 26  BUN 13 10  CREATININE 1.02 1.00  CALCIUM 8.2* 8.2*  PROT 5.5*  --   BILITOT 0.7  --   ALKPHOS 66  --   ALT 58  --   AST 44*  --   GLUCOSE 107* 117*   Lab Results  Component Value Date   CHOL 109 04/03/2014   HDL 44 04/03/2014   LDLCALC 47 10/30/2013   TRIG 61 04/03/2014   No results found for: DDIMER  Radiology/Studies  Dg Chest 2 View  08/10/2014   CLINICAL DATA:  Pneumonia, shortness of breath.  EXAM: CHEST  2 VIEW  COMPARISON:  PA and lateral chest x-ray of August 09, 2014  FINDINGS: The left lung is well-expanded. On the right there is persistent pleural fluid and increased basilar parenchymal density. The heart and pulmonary vascularity are normal. The mediastinum is normal in width. There is calcification in the wall of the thoracic aorta. There is mild gaseous distention of bowel under the left hemidiaphragm. The bony thorax exhibits no acute abnormality.  IMPRESSION: Persistent right lower lobe atelectasis or pneumonia and small right pleural effusion. There is likely underlying COPD. There has been interval development of minimal left basilar subsegmental atelectasis.    Electronically Signed   By: David  Martinique M.D.   On: 08/10/2014 07:53    OFB:PZWCHEN  2D Echo: 04/22/14: - Left ventricle: The cavity  size was normal. There was mild concentric hypertrophy. Systolic function was normal. The estimated ejection fraction was in the range of 60% to 65%. Wall motion was normal; there were no regional wall motion abnormalities. Doppler parameters are consistent with abnormal left ventricular relaxation (grade 1 diastolic dysfunction). Doppler parameters are consistent with high ventricular filling pressure. - Mitral valve: Moderately calcified annulus. There was mild regurgitation. - Left atrium: The atrium was mildly dilated.  ASSESSMENT AND PLAN  1.  Right lower lobe pneumonia 2.  COPD 3.  Paroxysmal atrial fibrillation. HisChadssvasc score is 6 for hypertension, age above 29, history of TIA, and vascular disease with prior coronary artery stent. 4. Ischemic heart disease status post successful drug-eluting stent to high grade proximal LAD stenosis. The patient has a known ostial occlusion of the right coronary artery with excellent left to right collateral flow. He is allergic to aspirin. He will be on lifelong Plavix.  He has mild elevation of troponins.  This is felt to be secondary to supply demand mismatch secondary to his rapid atrial fibrillation and to his pneumonia. 5. Hypercholesterolemia on low-dose Lipitor 6. History of bladder cancer followed by Dr. Karsten Ro 7. History of CLL followed by Dr[facility] Sabas Sous at [facility] 8. Essential hypertension without heart failure  Plan: Continue current cardiac meds.  It is likely that he will go back into normal sinus rhythm once the stress of his pneumonia is improved.  Continue metoprolol and diltiazem.  Continue Apixaban.  We will follow with you.  Berna Spare MD  08/11/2014, 11:29 AM

## 2014-08-12 DIAGNOSIS — J189 Pneumonia, unspecified organism: Secondary | ICD-10-CM | POA: Insufficient documentation

## 2014-08-12 DIAGNOSIS — J438 Other emphysema: Secondary | ICD-10-CM

## 2014-08-12 DIAGNOSIS — I480 Paroxysmal atrial fibrillation: Secondary | ICD-10-CM

## 2014-08-12 DIAGNOSIS — R0609 Other forms of dyspnea: Secondary | ICD-10-CM

## 2014-08-12 MED ORDER — DILTIAZEM HCL ER COATED BEADS 180 MG PO CP24
180.0000 mg | ORAL_CAPSULE | Freq: Every day | ORAL | Status: DC
  Administered 2014-08-12 – 2014-08-13 (×2): 180 mg via ORAL
  Filled 2014-08-12 (×2): qty 1

## 2014-08-12 NOTE — Progress Notes (Signed)
PROGRESS NOTE  Johnathan Strong JJK:[id number] DOB: 11-05-1923 DOA: 08/09/2014 PCP: Redge Gainer, MD  HPI: Johnathan Strong is an 81 y.o. male with a history of COPD who presented to the ED on 08/09/2014 with right-sided chest pain and shortness of breath after being seen at an outpatient clinic where he was told he had pneumonia and needed to be seen at the hospital.   Subjective / 24 H Interval events -Patient reporting feeling a little better today. He was able to ambulate down the hallway with his walker.  Assessment/Plan: Active Problems:   COPD GOLD III   Hypertension   DOE (dyspnea on exertion)   CAD (coronary artery disease)   Hyperlipemia   Atrial fibrillation   Paroxysmal atrial fibrillation   Pneumonia   Community Acquired Pneumonia -A repeat chest x-ray performed on 08/11/2014 showing right lower lobe consolidation, slightly worse compared to prior study. Despite these changes reported by radiology, clinically there seems improvement. -Will continue one more day of IV ceftriaxone and azithromycin, repeat chest x-ray in a.m.  A fib with RVR - developed on 6/7 - Suspect precipitated by community-acquired pneumonia - continue apixaban, clopidrogel, diltiazem -Overnight had episode of A. fib with RVR and started on IV Cardizem - Patient in sinus rhythm this morning for which he was transitioned to Cardizem CD 180 mg by mouth daily  COPD gold III - continue Symbicort, Spiriva, levalbuterol, montelukast - no wheezing, hold steroids  HTN - continue losartan, metoprolol tartrate, diltiazem  CAD - most recent cath 2014 with DES stenting of his LAD on 09/26/12 - needs to be on life long Plavix, continue - mild troponin elevation, stable, likely demand, no active chest pain  HLD - continue atorvastatin    Diet: Diet Heart Room service appropriate?: Yes; Fluid consistency:: Thin Fluids: None DVT Prophylaxis: SCDs   Code Status: Full Code Family Communication: Spoke to  his wife was present at bedside Disposition Plan: Anticipate discharge in the next 24 hours if continues to show improvement  Consultants:  Cardiology  Procedures:  None   Antibiotics Ceftriaxone 6/5 >> Azithromycin 6/5 >>   Studies  Dg Chest 2 View  08/11/2014   CLINICAL DATA:  Chest pain  EXAM: CHEST  2 VIEW  COMPARISON:  08/10/2014  FINDINGS: Heart is normal size. Consolidation in the right lower lobe concerning for pneumonia. Moderate right pleural effusion. Left lung is clear. No acute bony abnormality.  IMPRESSION: Continued right lower lobe consolidation and moderate right effusion, stable or slightly worsened since prior study.   Electronically Signed   By: Rolm Baptise M.D.   On: 08/11/2014 15:26    Objective  Filed Vitals:   08/12/14 0440 08/12/14 0500 08/12/14 0847 08/12/14 1349  BP: 121/63   138/52  Pulse: 59 77  78  Temp: 97.8 F (36.6 C)   98 F (36.7 C)  TempSrc: Oral   Tympanic  Resp:    18  Height:      Weight:      SpO2: 98%  96% 96%    Intake/Output Summary (Last 24 hours) at 08/12/14 1604 Last data filed at 08/12/14 1349  Gross per 24 hour  Intake    720 ml  Output   2150 ml  Net  -1430 ml   Filed Weights   08/09/14 1856  Weight: 73.483 kg (162 lb)   Exam:  General:  Patient is awake and alert, ambulated down the hallway with his walker  HEENT: no scleral icterus, normocephalic  Cardiovascular: RRR without MRG, 2+ peripheral pulses, no edema  Respiratory: Normal respiratory effort, having a few by basilar crackles, no rhonchi rales or crepitations  Abdomen: soft, non tender, BS +, no guarding  MSK/Extremities: no clubbing/cyanosis, no joint swelling, tenderness to palpation at  T1-3 R costovertebrals  Skin: no rashes  Neuro: non focal, grossly intact  Data Reviewed: Basic Metabolic Panel:  Recent Labs Lab 08/10/14 0658 08/11/14 0511  NA 136 136  K 4.2 4.4  CL 101 102  CO2 27 26  GLUCOSE 107* 117*  BUN 13 10    CREATININE 1.02 1.00  CALCIUM 8.2* 8.2*   Liver Function Tests:  Recent Labs Lab 08/10/14 0658  AST 44*  ALT 58  ALKPHOS 66  BILITOT 0.7  PROT 5.5*  ALBUMIN 3.0*   CBC:  Recent Labs Lab 08/10/14 0658 08/11/14 0511  WBC 13.2* 11.3*  HGB 10.6* 9.9*  HCT 32.0* 29.1*  MCV 92.8 90.9  PLT 188 179   BNP (last 3 results)  Recent Labs  04/21/14 1541  BNP 484.0*   Scheduled Meds: . apixaban  5 mg Oral BID  . atorvastatin  10 mg Oral Daily  . azithromycin  500 mg Intravenous Q24H  . budesonide-formoterol  2 puff Inhalation BID  . cefTRIAXone (ROCEPHIN)  IV  1 g Intravenous Q24H  . cholecalciferol  1,000 Units Oral Daily  . clopidogrel  75 mg Oral Daily  . diltiazem  180 mg Oral Daily  . famotidine  10 mg Oral Daily  . fluticasone  2 spray Each Nare Daily  . levalbuterol  0.63 mg Nebulization TID  . losartan  50 mg Oral BID  . metoprolol tartrate  25 mg Oral BID  . montelukast  10 mg Oral QHS  . omega-3 acid ethyl esters  1 g Oral Daily  . tiotropium  18 mcg Inhalation Daily   Continuous Infusions:   Marzetta Board, MD [facility] Pager [phone]. If 7 PM - 7 AM, please contact night-coverage at [email or web address], password [facility] 08/12/2014, 4:04 PM  LOS: 3 days

## 2014-08-12 NOTE — Progress Notes (Signed)
Subjective:  Had increased HR with Neb yesterday Feels better No CP  Objective:  Vital Signs in the last 24 hours: Temp:  [97.8 F (36.6 C)-98.4 F (36.9 C)] 97.8 F (36.6 C) (06/08 0440) Pulse Rate:  [59-145] 77 (06/08 0500) Resp:  [18] 18 (06/07 1950) BP: (119-185)/(60-100) 121/63 mmHg (06/08 0440) SpO2:  [78 %-100 %] 96 % (06/08 0847)  Intake/Output from previous day: 06/07 0701 - 06/08 0700 In: 720 [P.O.:720] Out: 1150 [Urine:1150]   Physical Exam: General: Well developed, well nourished, in no acute distress. Head:  Normocephalic and atraumatic. Lungs: Decreased air movement B Heart: Irreg normal rate. S1 and S2.  No murmur, rubs or gallops.  Abdomen: soft, non-tender, positive bowel sounds. Extremities: No clubbing or cyanosis. No edema. Neurologic: Alert and oriented x 3.    Lab Results:  Recent Labs  08/10/14 0658 08/11/14 0511  WBC 13.2* 11.3*  HGB 10.6* 9.9*  PLT 188 179    Recent Labs  08/10/14 0658 08/11/14 0511  NA 136 136  K 4.2 4.4  CL 101 102  CO2 27 26  GLUCOSE 107* 117*  BUN 13 10  CREATININE 1.02 1.00    Recent Labs  08/10/14 1617 08/11/14 0511  TROPONINI 0.13* 0.12*   Hepatic Function Panel  Recent Labs  08/10/14 0658  PROT 5.5*  ALBUMIN 3.0*  AST 44*  ALT 58  ALKPHOS 66  BILITOT 0.7   No results for input(s): CHOL in the last 72 hours. No results for input(s): PROTIME in the last 72 hours.  Imaging: Dg Chest 2 View  08/11/2014   CLINICAL DATA:  Chest pain  EXAM: CHEST  2 VIEW  COMPARISON:  08/10/2014  FINDINGS: Heart is normal size. Consolidation in the right lower lobe concerning for pneumonia. Moderate right pleural effusion. Left lung is clear. No acute bony abnormality.  IMPRESSION: Continued right lower lobe consolidation and moderate right effusion, stable or slightly worsened since prior study.   Electronically Signed   By: Rolm Baptise M.D.   On: 08/11/2014 15:26   Personally viewed.   Telemetry:  AFIB, better control Personally viewed.  Cardiac Studies:  ECHO 65%  Scheduled Meds: . apixaban  5 mg Oral BID  . atorvastatin  10 mg Oral Daily  . azithromycin  500 mg Intravenous Q24H  . budesonide-formoterol  2 puff Inhalation BID  . cefTRIAXone (ROCEPHIN)  IV  1 g Intravenous Q24H  . cholecalciferol  1,000 Units Oral Daily  . clopidogrel  75 mg Oral Daily  . diltiazem  180 mg Oral Daily  . famotidine  10 mg Oral Daily  . fluticasone  2 spray Each Nare Daily  . levalbuterol  0.63 mg Nebulization TID  . losartan  50 mg Oral BID  . metoprolol tartrate  25 mg Oral BID  . montelukast  10 mg Oral QHS  . omega-3 acid ethyl esters  1 g Oral Daily  . tiotropium  18 mcg Inhalation Daily   Continuous Infusions:  PRN Meds:.acetaminophen, albuterol, azelastine, nitroGLYCERIN, traMADol, zolpidem  Assessment/Plan:    1. Right lower lobe pneumonia 2. COPD 3. Paroxysmal atrial fibrillation. HisChadssvasc score is 6 for hypertension, age above 85, history of TIA, and vascular disease with prior coronary artery stent. 4. Ischemic heart disease status post successful drug-eluting stent to high grade proximal LAD stenosis earlier this year. The patient has a known ostial occlusion of the right coronary artery with excellent left to right collateral flow. He is allergic to aspirin.  He will be on lifelong Plavix. He has mild elevation of troponins. This is felt to be secondary to supply demand mismatch secondary to his rapid atrial fibrillation and to his pneumonia. 5. Hypercholesterolemia on low-dose Lipitor 6. History of bladder cancer followed by Dr. Karsten Ro 7. History of CLL followed by Dr. Sabas Sous at [facility] 8. Essential hypertension without heart failure  Plan: Continue current cardiac meds. It is likely that he will go back into normal sinus rhythm once the stress of his pneumonia is improved. Continue metoprolol and diltiazem. Continue Apixaban. Tachycardia with Xopenex  even.  No cardioversion. Would like to see continued improvement of pulm issues. Clinically improved.   We will follow with you. No Changes. Understands bleeding risks with Plavix and Apixaban.   ? [place], [place] 08/12/2014, 11:59 AM

## 2014-08-13 ENCOUNTER — Inpatient Hospital Stay (HOSPITAL_COMMUNITY): Payer: Medicare Other

## 2014-08-13 ENCOUNTER — Telehealth: Payer: Self-pay | Admitting: Family Medicine

## 2014-08-13 LAB — BASIC METABOLIC PANEL
ANION GAP: 11 (ref 5–15)
BUN: 8 mg/dL (ref 6–20)
CO2: 25 mmol/L (ref 22–32)
CREATININE: 1.03 mg/dL (ref 0.61–1.24)
Calcium: 8.6 mg/dL — ABNORMAL LOW (ref 8.9–10.3)
Chloride: 101 mmol/L (ref 101–111)
GFR calc non Af Amer: >60 mL/min (ref >60)
Glucose, Bld: 108 mg/dL — ABNORMAL HIGH (ref 65–99)
Potassium: 3.9 mmol/L (ref 3.5–5.1)
SODIUM: 137 mmol/L (ref 135–145)

## 2014-08-13 LAB — CBC
HCT: 34.4 % — ABNORMAL LOW (ref 39.0–52.0)
Hemoglobin: 11.3 g/dL — ABNORMAL LOW (ref 13.0–17.0)
MCH: 30.4 pg (ref 26.0–34.0)
MCHC: 32.8 g/dL (ref 30.0–36.0)
MCV: 92.5 fL (ref 78.0–100.0)
PLATELETS: 328 10*3/uL (ref 150–400)
RBC: 3.72 MIL/uL — ABNORMAL LOW (ref 4.22–5.81)
RDW: 21.4 % — ABNORMAL HIGH (ref 11.5–15.5)
WBC: 16.9 10*3/uL — ABNORMAL HIGH (ref 4.0–10.5)

## 2014-08-13 MED ORDER — DILTIAZEM HCL ER COATED BEADS 180 MG PO CP24: 180.0000 mg | ORAL_CAPSULE | Freq: Every day | ORAL | Status: DC

## 2014-08-13 MED ORDER — BISACODYL 10 MG RE SUPP
10.0000 mg | Freq: Once | RECTAL | Status: AC
  Administered 2014-08-13: 10 mg via RECTAL
  Filled 2014-08-13: qty 1

## 2014-08-13 MED ORDER — CALCIUM CARBONATE ANTACID 500 MG PO CHEW
1.0000 | CHEWABLE_TABLET | Freq: Once | ORAL | Status: AC
  Administered 2014-08-13: 200 mg via ORAL
  Filled 2014-08-13: qty 1

## 2014-08-13 MED ORDER — LEVALBUTEROL HCL 0.63 MG/3ML IN NEBU: 0.6300 mg | INHALATION_SOLUTION | RESPIRATORY_TRACT | Status: DC

## 2014-08-13 MED ORDER — CEFUROXIME AXETIL 250 MG PO TABS: 250.0000 mg | ORAL_TABLET | Freq: Two times a day (BID) | ORAL | Status: DC

## 2014-08-13 NOTE — Progress Notes (Signed)
Utilization review completed.  

## 2014-08-13 NOTE — Care Management Note (Signed)
Case Management Note  Patient Details  Name: Johnathan Strong MRN: [id number] Date of Birth: [birth year]  Subjective/Objective:    Pt admitted for Pnemonia                Action/Plan:  Pt is from home and will benefit for HHPT.  CM will assist with set up.   Expected Discharge Date:                  Expected Discharge Plan:  [place]  In-House Referral:     Discharge planning Services  CM Consult  Post Acute Care Choice:    Choice offered to:     DME Arranged:    DME Agency:     HH Arranged:  PT [place]:  [place]  Status of Service:  Completed, signed off  Medicare Important Message Given:  Yes Date Medicare IM Given:  08/12/14 Medicare IM give by:  [name] Date Additional Medicare IM Given:    Additional Medicare Important Message give by:     If discussed at [place] of Stay Meetings, dates discussed:    Disposition Plan:  Home with [place]  Additional Comments: 08/13/14 [name], RN, BSN  [phone] CM offered pt choice, pt chose [place].  [facility] contacted and referral was accepted. No additional CM needs.   [name], RN 08/13/2014, 10:47 AM

## 2014-08-13 NOTE — Progress Notes (Signed)
Discharge instructions given. Pt verbalized understanding and all questions were answered.  

## 2014-08-13 NOTE — Telephone Encounter (Signed)
Will see if he was referred by hospital first

## 2014-08-13 NOTE — Evaluation (Signed)
Clinical/Bedside Swallow Evaluation Patient Details  Name: Johnathan Strong MRN: [id number] Date of Birth: 1923-04-15  Today's Date: 08/13/2014 Time: SLP Start Time (ACUTE ONLY): 6759 SLP Stop Time (ACUTE ONLY): 1512 SLP Time Calculation (min) (ACUTE ONLY): 27 min  Past Medical History:  Past Medical History  Diagnosis Date  . Osteopenia 2006  . Hyperlipidemia   . BPH (benign prostatic hypertrophy)   . Transient global amnesia   . Osteoarthritis   . History of TIA (transient ischemic attack) 1980'S    NO RESIDUAL  . COPD (chronic obstructive pulmonary disease) with emphysema   . Chronic back pain   . Itching SECONDARY TO CLL-- CONTROLLED W/ SINGULAIR  . Frequency   . Nocturia   . Hypertension CARDIOLOGIST- DR BRACKBILL-- LAST VISIT 12-15-2010 NOTE IN EPIC  . CLL (chronic lymphocytic leukemia) LAST PLT COUNT APRL 2012  168    Managed at [facility]  . Bladder cancer   . Aspirin allergy     Eyes swell  . Asthma   . Dyspnea on exertion   . CAD (coronary artery disease)     a. 08/2012 Cath: LM nl, LAD 90p, LCX 20-30, RCA 100ost fills via L->R collats, EF 55-65%;  b. 09/2012 PCI of prox LAD with 3.0x16 Promus DES.   Past Surgical History:  Past Surgical History  Procedure Laterality Date  . Excisional hemorrhoidectomy  1980  . Cardiovascular stress test  07/2005    a. 07/2005- no reversible ischemia, normal EF b. 06/2009- EF 73%, no reversible ischemia, inferolateral TWIs at rest, upright in recovery c. 08/2012- mediuim-sized partially reversible basal to mid inferior and inferoseptal perfusion defect c/w with prior infarction and peri-infarct ischemia; EF 68% and borderline ST/T changes on stress ECG   . Transurethral resection of bladder tumor  10-19-2008    AND DILATION URETHRAL STRICTURE  . Cataract extraction w/ intraocular lens  implant, bilateral Bilateral ~ 2007  . Transthoracic echocardiogram  01-03-2011    LVEF 16-38%, grade 1 diastolic dysfunction, no WMAs or  structural abnormalities  . Cystoscopy with biopsy  10/30/2011    Procedure: CYSTOSCOPY WITH BIOPSY;  Surgeon: Claybon Jabs, MD;  Location: [facility];  Service: Urology;  Laterality: N/A;  . Cardiac catheterization  08/27/2012  . Coronary angioplasty with stent placement  09/26/2012    "1" (09/26/2012)  . Coronary stent placement  09/26/12  . Left heart catheterization with coronary angiogram N/A 08/27/2012    Procedure: LEFT HEART CATHETERIZATION WITH CORONARY ANGIOGRAM;  Surgeon: Peter M Martinique, MD;  Location: [facility] CATH LAB;  Service: Cardiovascular;  Laterality: N/A;  . Percutaneous coronary stent intervention (pci-s) N/A 09/26/2012    Procedure: PERCUTANEOUS CORONARY STENT INTERVENTION (PCI-S);  Surgeon: Peter M Martinique, MD;  Location: [facility] CATH LAB;  Service: Cardiovascular;  Laterality: N/A;   HPI:  Johnathan Strong is an 81 y.o. male with a history of COPD who presented to the ED on 08/09/2014 with RLL PNA. Pt reports taking Tums daily for reflux-like symptoms (no formal dx), and describes feeling like solid foods, particularly meats, get "stuck" in his throat.   Assessment / Plan / Recommendation Clinical Impression  Pt's oropharyngeal swallow appears WFL. Bolus manipulation and oral transit are swift, with no overt indicators for aspiration observed across consistencies. Given pt's reported symptoms, question possible esophageal component. No further SLP f/u warranted at this time, however MD may wish to consider esophageal w/u on an OP basis. SLP reviewed esophageal precautions with pt and  his niece, with all questions answered at this time.    Aspiration Risk  Mild    Diet Recommendation Age appropriate regular solids;Thin   Medication Administration: Whole meds with liquid Compensations: Slow rate;Small sips/bites;Follow solids with liquid    Other  Recommendations Recommended Consults: Consider esophageal assessment Oral Care Recommendations: Oral care BID   Follow Up  Recommendations   N/A    Pertinent Vitals/Pain n/a    SLP Swallow Goals     Swallow Study Prior Functional Status       General Other Pertinent Information: Johnathan Strong is an 81 y.o. male with a history of COPD who presented to the ED on 08/09/2014 with RLL PNA. Pt reports taking Tums daily for reflux-like symptoms (no formal dx), and describes feeling like solid foods, particularly meats, get "stuck" in his throat. Type of Study: Bedside swallow evaluation Previous Swallow Assessment: none in chart Diet Prior to this Study: Regular;Thin liquids Temperature Spikes Noted: No Respiratory Status: Room air History of Recent Intubation: No Behavior/Cognition: Alert;Cooperative;Pleasant mood Oral Cavity - Dentition: Adequate natural dentition/normal for age Self-Feeding Abilities: Able to feed self Patient Positioning: Other (comment) (EOB) Baseline Vocal Quality: Normal    Oral/Motor/Sensory Function Overall Oral Motor/Sensory Function: Appears within functional limits for tasks assessed   Ice Chips Ice chips: Not tested   Thin Liquid Thin Liquid: Within functional limits    Nectar Thick Nectar Thick Liquid: Not tested   Honey Thick Honey Thick Liquid: Not tested   Puree Puree: Within functional limits Presentation: Self Fed;Spoon   Solid    Solid: Within functional limits Presentation: Self Fed      [name], M.A. CCC-SLP [phone]  [name] 08/13/2014,3:27 PM

## 2014-08-13 NOTE — Progress Notes (Signed)
    Subjective: Constipated this morning  Objective: Vital signs in last 24 hours: Temp:  [97.4 F (36.3 C)-98 F (36.7 C)] 97.4 F (36.3 C) (06/09 0604) Pulse Rate:  [71-88] 78 (06/09 0604) Resp:  [18] 18 (06/09 0604) BP: (138-179)/(52-82) 153/57 mmHg (06/09 0604) SpO2:  [92 %-98 %] 93 % (06/09 0735) Last BM Date: 08/12/14  Intake/Output from previous day: 06/08 0701 - 06/09 0700 In: 960 [P.O.:960] Out: 1200 [Urine:1200] Intake/Output this shift:    Medications Scheduled Meds: . apixaban  5 mg Oral BID  . atorvastatin  10 mg Oral Daily  . azithromycin  500 mg Intravenous Q24H  . bisacodyl  10 mg Rectal Once  . budesonide-formoterol  2 puff Inhalation BID  . cefTRIAXone (ROCEPHIN)  IV  1 g Intravenous Q24H  . cholecalciferol  1,000 Units Oral Daily  . clopidogrel  75 mg Oral Daily  . diltiazem  180 mg Oral Daily  . famotidine  10 mg Oral Daily  . fluticasone  2 spray Each Nare Daily  . levalbuterol  0.63 mg Nebulization TID  . levalbuterol  0.63 mg Nebulization NOW  . losartan  50 mg Oral BID  . metoprolol tartrate  25 mg Oral BID  . montelukast  10 mg Oral QHS  . omega-3 acid ethyl esters  1 g Oral Daily  . tiotropium  18 mcg Inhalation Daily   Continuous Infusions:  PRN Meds:.acetaminophen, albuterol, azelastine, nitroGLYCERIN, traMADol, zolpidem  PE: General appearance: alert, cooperative and no distress Lungs: clear to auscultation bilaterally Heart: regular rate and rhythm, S1, S2 normal, no murmur, click, rub or gallop Extremities: No LEE Pulses: 2+ and symmetric Skin: Warm and dry Neurologic: Grossly normal  Lab Results:   Recent Labs  08/11/14 0511 08/13/14 0503  WBC 11.3* 16.9*  HGB 9.9* 11.3*  HCT 29.1* 34.4*  PLT 179 328   BMET  Recent Labs  08/11/14 0511 08/13/14 0503  NA 136 137  K 4.4 3.9  CL 102 101  CO2 26 25  GLUCOSE 117* 108*  BUN 10 8  CREATININE 1.00 1.03  CALCIUM 8.2* 8.6*    Assessment/Plan  1. Right lower  lobe pneumonia 2. COPD 3. Paroxysmal atrial fibrillation.  He converted to sinus rhythm yesterday.  His Chadsvasc score is 6 for hypertension, age above 42, history of TIA, and vascular disease with prior coronary artery stent.  On Eliquis 5mg  BID.  Cardizem 180, lopressor 25 bid.  4. Ischemic heart disease  status post successful drug-eluting stent to high grade proximal LAD stenosis earlier this year. The patient has a known ostial occlusion of the right coronary artery with excellent left to right collateral flow. He is allergic to aspirin. He will be on lifelong Plavix. He has mild elevation of troponins. This is felt to be secondary to supply demand mismatch secondary to his rapid atrial fibrillation and to his pneumonia. 5. Hypercholesterolemia on low-dose Lipitor 6. History of bladder cancer followed by Dr. Karsten Ro 7. History of CLL followed by Dr. Sabas Sous at [facility] 8. Essential hypertension without heart failure Also on cozaar 50 bid.   Discharge planned for today.     LOS: 4 days    HAGER, BRYAN PA-C 08/13/2014 9:46 AM  Personally seen and examined. Agree with above. PAF improved, NSR, meds reviewed. RRR PNA improved. ABX  Getting DC'd  Candee Furbish, MD

## 2014-08-14 ENCOUNTER — Other Ambulatory Visit: Payer: Self-pay | Admitting: Family Medicine

## 2014-08-14 NOTE — Telephone Encounter (Signed)
Wife is to call us and let us know

## 2014-08-14 NOTE — Telephone Encounter (Signed)
Hospital ordered PT for patient

## 2014-08-16 LAB — CULTURE, BLOOD (ROUTINE X 2)
CULTURE: NO GROWTH
CULTURE: NO GROWTH

## 2014-08-17 ENCOUNTER — Encounter: Payer: Self-pay | Admitting: Cardiology

## 2014-08-19 NOTE — Discharge Summary (Signed)
Physician Discharge Summary  Johnathan Strong EHO:[id number] DOB: 24-Jul-1923 DOA: 08/09/2014  PCP: Redge Gainer, MD  Admit date: 08/09/2014 Discharge date: 08/19/2014  Time spent: 35 minutes  Recommendations for Outpatient Follow-up:  1. Please follow up on patient's respiratory status, he was treated for COPD exacerbation.  2. Follow up on HR's, he was found to be in Afib during this hospitalization that was likely precipitated by COPD  Discharge Diagnoses:  Active Problems:   COPD GOLD III   Hypertension   DOE (dyspnea on exertion)   CAD (coronary artery disease)   Hyperlipemia   Atrial fibrillation   Paroxysmal atrial fibrillation   Pneumonia   CAP (community acquired pneumonia)   Discharge Condition: Stable  Diet recommendation: Heart Healthy  Filed Weights   08/09/14 1856  Weight: 73.483 kg (162 lb)    History of present illness:  81 yo male with hx of Copd, Pafib (CHADS2= 3), c/o dyspnea for the past 1 week. Pt was seen as outpatient today and sent for admission for pneumonia, and atrial fibrillation. + chills, Pt denies fever, cough, palp, n/v, diarrhea, brbpr, black stool. Pt noted right sided pain with cough.Pt will be admitted for pneumonia  Hospital Course:  Patient is a pleasant 81 y/o with a history of Afib who was admitted to the medicine service on 08/09/2014 when he had presented with complaints of shortness of breath associated with cough. He has had previous hospitalizations secondary to COPD. A CXR revealed a possible right lower lobe PNA. He was felt to have a COPD exacerbation likely precipitated by CAP as he was started on systemic steroids, IV antibiotics and scheduled nebs. Hospitalization was complicated by Afib with RVR that was possibly triggered by underlying lung disease. HR's improved with the administration of IV diltiazem.   Community Acquired Pneumonia -A repeat chest x-ray performed on 08/11/2014 showing right lower lobe consolidation, slightly  worse compared to prior study. Despite these changes reported by radiology, clinically there seems improvement. -By day of discharge he was ambulating down the hallway on his own and felt ready for discharge. He was discharged on ceftin.   A fib with RVR - developed on 6/7 - Suspect precipitated by community-acquired pneumonia - continue apixaban, clopidrogel, diltiazem -Overnight had episode of A. fib with RVR and started on IV Cardizem - Patient in sinus rhythm this morning for which he was transitioned to Cardizem CD 180 mg by mouth daily  COPD gold III - continue Symbicort, Spiriva, levalbuterol, montelukast - no wheezing  HTN - continue losartan, metoprolol tartrate, diltiazem  CAD - most recent cath 2014 with DES stenting of his LAD on 09/26/12 - needs to be on life long Plavix, continue - mild troponin elevation, stable, likely demand, no active chest pain  HLD - continue atorvastatin   Consultations:  Cardiology  Discharge Exam: Filed Vitals:   08/13/14 1400  BP: 149/55  Pulse: 75  Temp: 97.5 F (36.4 C)  Resp: 18     General: Patient is awake and alert, ambulated down the hallway   HEENT: no scleral icterus, normocephalic  Cardiovascular: RRR without MRG, 2+ peripheral pulses, no edema  Respiratory: Normal respiratory effort, having a few by basilar crackles, no rhonchi rales or crepitations  Abdomen: soft, non tender, BS +, no guarding  MSK/Extremities: no clubbing/cyanosis, no joint swelling, tenderness to palpation at T1-3 R costovertebrals  Skin: no rashes  Neuro: non focal, grossly intact  Discharge Instructions   Discharge Instructions    Call MD for:  difficulty breathing, headache or visual disturbances    Complete by:  As directed      Call MD for:  extreme fatigue    Complete by:  As directed      Call MD for:  hives    Complete by:  As directed      Call MD for:  persistant dizziness or light-headedness    Complete by:  As  directed      Call MD for:  persistant nausea and vomiting    Complete by:  As directed      Call MD for:  redness, tenderness, or signs of infection (pain, swelling, redness, odor or green/yellow discharge around incision site)    Complete by:  As directed      Call MD for:  severe uncontrolled pain    Complete by:  As directed      Call MD for:  temperature >100.4    Complete by:  As directed      Diet - low sodium heart healthy    Complete by:  As directed      Increase activity slowly    Complete by:  As directed           Discharge Medication List as of 08/13/2014  5:09 PM    START taking these medications   Details  cefUROXime (CEFTIN) 250 MG tablet Take 1 tablet (250 mg total) by mouth 2 (two) times daily with a meal., Starting 08/13/2014, Until Discontinued, Print      CONTINUE these medications which have CHANGED   Details  diltiazem (CARDIZEM CD) 180 MG 24 hr capsule Take 1 capsule (180 mg total) by mouth daily., Starting 08/13/2014, Until Discontinued, Print      CONTINUE these medications which have NOT CHANGED   Details  albuterol (VENTOLIN HFA) 108 (90 BASE) MCG/ACT inhaler Inhale 2 puffs into the lungs every 6 (six) hours as needed for wheezing. , Until Discontinued, Historical Med    Azelastine HCl (ASTELIN NA) Place 1 spray into the nose as needed (for congestion). , Until Discontinued, Historical Med    Cholecalciferol (VITAMIN D3) 1000 UNITS CAPS Take 1 capsule by mouth daily. , Until Discontinued, Historical Med    clopidogrel (PLAVIX) 75 MG tablet TAKE 1 TABLET ONCE A DAY, Normal    diphenhydramine-acetaminophen (TYLENOL PM) 25-500 MG TABS Take 1 tablet by mouth at bedtime as needed. sleep, Until Discontinued, Historical Med    fluticasone (FLONASE) 50 MCG/ACT nasal spray USE 2 SPRAYS INTO EACH NOSTRIL ONCE DAILY, Normal    LIPITOR 10 MG tablet TAKE 1 TABLET DAILY, Normal    losartan (COZAAR) 50 MG tablet TAKE (1) TABLET TWICE A DAY., Normal    metoprolol  tartrate (LOPRESSOR) 25 MG tablet Take 1 tablet (25 mg total) by mouth 2 (two) times daily., Starting 05/19/2014, Until Discontinued, Normal    montelukast (SINGULAIR) 10 MG tablet TAKE ONE TABLET AT BEDTIME, Normal    Omega-3 Fatty Acids (FISH OIL) 1200 MG CAPS Take 1 capsule by mouth daily. , Until Discontinued, Historical Med    SPIRIVA HANDIHALER 18 MCG inhalation capsule INHALE THE CONTENTS OF ONE CAPSULE ONCE DAILY AS DIRECTED, Normal    SYMBICORT 160-4.5 MCG/ACT inhaler 2 PUFFS EVERY 12 HOURS, Normal    traMADol (ULTRAM) 50 MG tablet TAKE 1 TABLET UP TO 2 TIMES A DAY, Print    triamcinolone cream (KENALOG) 0.1 % APPLY TO AFFECTED AREAS 2 TIMES A DAY AS NEEDED, Normal    apixaban (ELIQUIS) 5  MG TABS tablet Take 1 tablet (5 mg total) by mouth 2 (two) times daily., Starting 05/19/2014, Until Discontinued, Normal    cimetidine (TAGAMET) 300 MG tablet TAKE 1 TABLET UP TO 4 TIMES A DAY, Normal    furosemide (LASIX) 20 MG tablet TAKE (1) TABLET DAILY AS DIRECTED., Normal    nitroGLYCERIN (NITROSTAT) 0.4 MG SL tablet Place 1 tablet (0.4 mg total) under the tongue every 5 (five) minutes as needed for chest pain., Starting 09/27/2012, Until Discontinued, Normal      STOP taking these medications     testosterone cypionate (DEPO-TESTOSTERONE) 200 MG/ML injection        Allergies  Allergen Reactions  . Aspirin Anaphylaxis, Shortness Of Breath and Swelling  . Nsaids Anaphylaxis, Shortness Of Breath and Swelling    Raise BP  . Septra [Sulfamethoxazole-Trimethoprim] Rash  . Albuterol     Can not tolerate high/ frequent doses due to [place] with using HFA PRN  . Benicar [Olmesartan Medoxomil]     dizzy  . Salicylates     SOB  . Ampicillin Rash   Follow-up Information    Follow up with Redge Gainer, MD In 2 weeks.   Specialty:  Family Medicine   Contact information:   [place] [address] [phone]       Follow up with [facility].    Why:  Physical Therapy   Contact information:   [address] [place] [address] [phone]        The results of significant diagnostics from this hospitalization (including imaging, microbiology, ancillary and laboratory) are listed below for reference.    Significant Diagnostic Studies: Dg Chest 2 View  08/13/2014   CLINICAL DATA:  Shortness of breath weakness pneumonia  EXAM: CHEST  2 VIEW  COMPARISON:  08/11/2014  FINDINGS: Moderate right effusion with underlying consolidation stable. Heart size and vascular pattern normal. No evidence of infiltrate on the left. Dense circumscribed oval 1 cm nodule to the left of the descending thoracic aorta stable from 02/26/2012 and therefore considered to be benign. This projects over the spine on the lateral radiograph and it is likely a granuloma given its density and stability.  IMPRESSION: No change from prior study. Moderate right effusion and lower lobe consolidation.   Electronically Signed   By: Skipper Cliche M.D.   On: 08/13/2014 14:08   Dg Chest 2 View  08/11/2014   CLINICAL DATA:  Chest pain  EXAM: CHEST  2 VIEW  COMPARISON:  08/10/2014  FINDINGS: Heart is normal size. Consolidation in the right lower lobe concerning for pneumonia. Moderate right pleural effusion. Left lung is clear. No acute bony abnormality.  IMPRESSION: Continued right lower lobe consolidation and moderate right effusion, stable or slightly worsened since prior study.   Electronically Signed   By: Rolm Baptise M.D.   On: 08/11/2014 15:26   Dg Chest 2 View  08/10/2014   CLINICAL DATA:  Pneumonia, shortness of breath.  EXAM: CHEST  2 VIEW  COMPARISON:  PA and lateral chest x-ray of August 09, 2014  FINDINGS: The left lung is well-expanded. On the right there is persistent pleural fluid and increased basilar parenchymal density. The heart and pulmonary vascularity are normal. The mediastinum is normal in width. There is calcification in the wall of the thoracic aorta.  There is mild gaseous distention of bowel under the left hemidiaphragm. The bony thorax exhibits no acute abnormality.  IMPRESSION: Persistent right lower lobe  atelectasis or pneumonia and small right pleural effusion. There is likely underlying COPD. There has been interval development of minimal left basilar subsegmental atelectasis.   Electronically Signed   By: David  Martinique M.D.   On: 08/10/2014 07:53    Microbiology: Recent Results (from the past 240 hour(s))  Culture, blood (routine x 2) Call MD if unable to obtain prior to antibiotics being given     Status: None   Collection Time: 08/09/14  9:33 PM  Result Value Ref Range Status   Specimen Description BLOOD RIGHT ANTECUBITAL  Final   Special Requests   Final    BOTTLES DRAWN AEROBIC AND ANAEROBIC [place] BLUE [place]   Culture   Final    NO GROWTH 5 DAYS Performed at [facility]    Report Status 08/16/2014 FINAL  Final  Culture, blood (routine x 2) Call MD if unable to obtain prior to antibiotics being given     Status: None   Collection Time: 08/09/14  9:45 PM  Result Value Ref Range Status   Specimen Description BLOOD RIGHT ANTECUBITAL  Final   Special Requests BOTTLES DRAWN AEROBIC AND ANAEROBIC 5CC  Final   Culture   Final    NO GROWTH 5 DAYS Performed at [facility]    Report Status 08/16/2014 FINAL  Final     Labs: Basic Metabolic Panel:  Recent Labs Lab 08/13/14 0503  NA 137  K 3.9  CL 101  CO2 25  GLUCOSE 108*  BUN 8  CREATININE 1.03  CALCIUM 8.6*   Liver Function Tests: No results for input(s): AST, ALT, ALKPHOS, BILITOT, PROT, ALBUMIN in the last 168 hours. No results for input(s): LIPASE, AMYLASE in the last 168 hours. No results for input(s): AMMONIA in the last 168 hours. CBC:  Recent Labs Lab 08/13/14 0503  WBC 16.9*  HGB 11.3*  HCT 34.4*  MCV 92.5  PLT 328   Cardiac Enzymes: No results for input(s): CKTOTAL, CKMB, CKMBINDEX, TROPONINI in the last 168 hours. BNP: BNP  (last 3 results)  Recent Labs  04/21/14 1541  BNP 484.0*    ProBNP (last 3 results) No results for input(s): PROBNP in the last 8760 hours.  CBG: No results for input(s): GLUCAP in the last 168 hours.     SignedKelvin Cellar  [facility] 08/19/2014, 1:22 PM

## 2014-08-25 ENCOUNTER — Inpatient Hospital Stay (HOSPITAL_COMMUNITY)
Admission: EM | Admit: 2014-08-25 | Discharge: 2014-09-15 | DRG: 871 | Disposition: A | Discharge status: Home Health | Payer: Medicare Other | Attending: Family Medicine | Admitting: Family Medicine

## 2014-08-25 ENCOUNTER — Encounter (HOSPITAL_COMMUNITY): Payer: Self-pay | Admitting: Emergency Medicine

## 2014-08-25 ENCOUNTER — Emergency Department (HOSPITAL_COMMUNITY): Payer: Medicare Other

## 2014-08-25 ENCOUNTER — Inpatient Hospital Stay (HOSPITAL_COMMUNITY): Payer: Medicare Other

## 2014-08-25 DIAGNOSIS — J9601 Acute respiratory failure with hypoxia: Secondary | ICD-10-CM | POA: Diagnosis present

## 2014-08-25 DIAGNOSIS — D72829 Elevated white blood cell count, unspecified: Secondary | ICD-10-CM | POA: Diagnosis present

## 2014-08-25 DIAGNOSIS — I5033 Acute on chronic diastolic (congestive) heart failure: Secondary | ICD-10-CM | POA: Diagnosis not present

## 2014-08-25 DIAGNOSIS — I959 Hypotension, unspecified: Secondary | ICD-10-CM | POA: Diagnosis not present

## 2014-08-25 DIAGNOSIS — A419 Sepsis, unspecified organism: Secondary | ICD-10-CM | POA: Diagnosis present

## 2014-08-25 DIAGNOSIS — I480 Paroxysmal atrial fibrillation: Secondary | ICD-10-CM | POA: Diagnosis not present

## 2014-08-25 DIAGNOSIS — I4892 Unspecified atrial flutter: Secondary | ICD-10-CM | POA: Diagnosis not present

## 2014-08-25 DIAGNOSIS — Z7901 Long term (current) use of anticoagulants: Secondary | ICD-10-CM

## 2014-08-25 DIAGNOSIS — J90 Pleural effusion, not elsewhere classified: Secondary | ICD-10-CM

## 2014-08-25 DIAGNOSIS — N189 Chronic kidney disease, unspecified: Secondary | ICD-10-CM | POA: Diagnosis present

## 2014-08-25 DIAGNOSIS — I130 Hypertensive heart and chronic kidney disease with heart failure and stage 1 through stage 4 chronic kidney disease, or unspecified chronic kidney disease: Secondary | ICD-10-CM | POA: Diagnosis present

## 2014-08-25 DIAGNOSIS — R0602 Shortness of breath: Secondary | ICD-10-CM

## 2014-08-25 DIAGNOSIS — C9110 Chronic lymphocytic leukemia of B-cell type not having achieved remission: Secondary | ICD-10-CM | POA: Diagnosis present

## 2014-08-25 DIAGNOSIS — C9210 Chronic myeloid leukemia, BCR/ABL-positive, not having achieved remission: Secondary | ICD-10-CM | POA: Diagnosis present

## 2014-08-25 DIAGNOSIS — Z7902 Long term (current) use of antithrombotics/antiplatelets: Secondary | ICD-10-CM | POA: Diagnosis not present

## 2014-08-25 DIAGNOSIS — R627 Adult failure to thrive: Secondary | ICD-10-CM | POA: Diagnosis present

## 2014-08-25 DIAGNOSIS — IMO0001 Reserved for inherently not codable concepts without codable children: Secondary | ICD-10-CM | POA: Diagnosis present

## 2014-08-25 DIAGNOSIS — E860 Dehydration: Secondary | ICD-10-CM | POA: Diagnosis not present

## 2014-08-25 DIAGNOSIS — I4891 Unspecified atrial fibrillation: Secondary | ICD-10-CM | POA: Diagnosis present

## 2014-08-25 DIAGNOSIS — E663 Overweight: Secondary | ICD-10-CM | POA: Diagnosis present

## 2014-08-25 DIAGNOSIS — Y95 Nosocomial condition: Secondary | ICD-10-CM | POA: Diagnosis present

## 2014-08-25 DIAGNOSIS — N179 Acute kidney failure, unspecified: Secondary | ICD-10-CM | POA: Diagnosis not present

## 2014-08-25 DIAGNOSIS — Z8551 Personal history of malignant neoplasm of bladder: Secondary | ICD-10-CM | POA: Diagnosis not present

## 2014-08-25 DIAGNOSIS — Z8673 Personal history of transient ischemic attack (TIA), and cerebral infarction without residual deficits: Secondary | ICD-10-CM

## 2014-08-25 DIAGNOSIS — J869 Pyothorax without fistula: Secondary | ICD-10-CM

## 2014-08-25 DIAGNOSIS — I481 Persistent atrial fibrillation: Secondary | ICD-10-CM | POA: Diagnosis not present

## 2014-08-25 DIAGNOSIS — I10 Essential (primary) hypertension: Secondary | ICD-10-CM | POA: Diagnosis present

## 2014-08-25 DIAGNOSIS — T380X5A Adverse effect of glucocorticoids and synthetic analogues, initial encounter: Secondary | ICD-10-CM | POA: Diagnosis not present

## 2014-08-25 DIAGNOSIS — Z9689 Presence of other specified functional implants: Secondary | ICD-10-CM

## 2014-08-25 DIAGNOSIS — J948 Other specified pleural conditions: Secondary | ICD-10-CM | POA: Diagnosis not present

## 2014-08-25 DIAGNOSIS — J45909 Unspecified asthma, uncomplicated: Secondary | ICD-10-CM | POA: Diagnosis present

## 2014-08-25 DIAGNOSIS — J9690 Respiratory failure, unspecified, unspecified whether with hypoxia or hypercapnia: Secondary | ICD-10-CM

## 2014-08-25 DIAGNOSIS — Z7951 Long term (current) use of inhaled steroids: Secondary | ICD-10-CM

## 2014-08-25 DIAGNOSIS — Z955 Presence of coronary angioplasty implant and graft: Secondary | ICD-10-CM | POA: Diagnosis not present

## 2014-08-25 DIAGNOSIS — G8929 Other chronic pain: Secondary | ICD-10-CM | POA: Diagnosis present

## 2014-08-25 DIAGNOSIS — J9600 Acute respiratory failure, unspecified whether with hypoxia or hypercapnia: Secondary | ICD-10-CM | POA: Insufficient documentation

## 2014-08-25 DIAGNOSIS — Z79899 Other long term (current) drug therapy: Secondary | ICD-10-CM | POA: Diagnosis not present

## 2014-08-25 DIAGNOSIS — Z515 Encounter for palliative care: Secondary | ICD-10-CM | POA: Diagnosis not present

## 2014-08-25 DIAGNOSIS — Z87891 Personal history of nicotine dependence: Secondary | ICD-10-CM

## 2014-08-25 DIAGNOSIS — I482 Chronic atrial fibrillation: Secondary | ICD-10-CM | POA: Diagnosis not present

## 2014-08-25 DIAGNOSIS — M549 Dorsalgia, unspecified: Secondary | ICD-10-CM | POA: Diagnosis present

## 2014-08-25 DIAGNOSIS — I2510 Atherosclerotic heart disease of native coronary artery without angina pectoris: Secondary | ICD-10-CM | POA: Diagnosis present

## 2014-08-25 DIAGNOSIS — J441 Chronic obstructive pulmonary disease with (acute) exacerbation: Secondary | ICD-10-CM | POA: Diagnosis not present

## 2014-08-25 DIAGNOSIS — E785 Hyperlipidemia, unspecified: Secondary | ICD-10-CM | POA: Diagnosis present

## 2014-08-25 DIAGNOSIS — J811 Chronic pulmonary edema: Secondary | ICD-10-CM

## 2014-08-25 DIAGNOSIS — Z789 Other specified health status: Secondary | ICD-10-CM | POA: Diagnosis not present

## 2014-08-25 DIAGNOSIS — Z66 Do not resuscitate: Secondary | ICD-10-CM | POA: Diagnosis present

## 2014-08-25 DIAGNOSIS — R0600 Dyspnea, unspecified: Secondary | ICD-10-CM

## 2014-08-25 DIAGNOSIS — I82401 Acute embolism and thrombosis of unspecified deep veins of right lower extremity: Secondary | ICD-10-CM | POA: Diagnosis not present

## 2014-08-25 DIAGNOSIS — E871 Hypo-osmolality and hyponatremia: Secondary | ICD-10-CM | POA: Diagnosis not present

## 2014-08-25 DIAGNOSIS — D649 Anemia, unspecified: Secondary | ICD-10-CM | POA: Diagnosis present

## 2014-08-25 DIAGNOSIS — J918 Pleural effusion in other conditions classified elsewhere: Secondary | ICD-10-CM | POA: Diagnosis present

## 2014-08-25 DIAGNOSIS — J189 Pneumonia, unspecified organism: Secondary | ICD-10-CM | POA: Diagnosis present

## 2014-08-25 LAB — URINALYSIS, ROUTINE W REFLEX MICROSCOPIC
Bilirubin Urine: NEGATIVE
Glucose, UA: NEGATIVE mg/dL
Hgb urine dipstick: NEGATIVE
KETONES UR: 15 mg/dL — AB
Leukocytes, UA: NEGATIVE
NITRITE: NEGATIVE
PH: 5.5 (ref 5.0–8.0)
PROTEIN: NEGATIVE mg/dL
Specific Gravity, Urine: 1.023 (ref 1.005–1.030)
Urobilinogen, UA: 1 mg/dL (ref 0.0–1.0)

## 2014-08-25 LAB — COMPREHENSIVE METABOLIC PANEL
ALBUMIN: 3.8 g/dL (ref 3.5–5.0)
ALK PHOS: 72 U/L (ref 38–126)
ALT: 18 U/L (ref 17–63)
AST: 19 U/L (ref 15–41)
Anion gap: 8 (ref 5–15)
BUN: 14 mg/dL (ref 6–20)
CHLORIDE: 103 mmol/L (ref 101–111)
CO2: 26 mmol/L (ref 22–32)
Calcium: 8.8 mg/dL — ABNORMAL LOW (ref 8.9–10.3)
Creatinine, Ser: 0.97 mg/dL (ref 0.61–1.24)
GFR calc Af Amer: >60 mL/min (ref >60)
GFR calc non Af Amer: >60 mL/min (ref >60)
GLUCOSE: 172 mg/dL — AB (ref 65–99)
POTASSIUM: 4.2 mmol/L (ref 3.5–5.1)
SODIUM: 137 mmol/L (ref 135–145)
Total Bilirubin: 1.5 mg/dL — ABNORMAL HIGH (ref 0.3–1.2)
Total Protein: 6.3 g/dL — ABNORMAL LOW (ref 6.5–8.1)

## 2014-08-25 LAB — CBC WITH DIFFERENTIAL/PLATELET
BASOS PCT: 0 % (ref 0–1)
Basophils Absolute: 0 10*3/uL (ref 0.0–0.1)
Eosinophils Absolute: 0 10*3/uL (ref 0.0–0.7)
Eosinophils Relative: 0 % (ref 0–5)
HCT: 37.1 % — ABNORMAL LOW (ref 39.0–52.0)
Hemoglobin: 12.1 g/dL — ABNORMAL LOW (ref 13.0–17.0)
LYMPHS PCT: 23 % (ref 12–46)
Lymphs Abs: 5.6 10*3/uL — ABNORMAL HIGH (ref 0.7–4.0)
MCH: 30.1 pg (ref 26.0–34.0)
MCHC: 32.6 g/dL (ref 30.0–36.0)
MCV: 92.3 fL (ref 78.0–100.0)
MONOS PCT: 4 % (ref 3–12)
Monocytes Absolute: 1 10*3/uL (ref 0.1–1.0)
NEUTROS ABS: 17.6 10*3/uL — AB (ref 1.7–7.7)
Neutrophils Relative %: 73 % (ref 43–77)
Platelets: 255 10*3/uL (ref 150–400)
RBC: 4.02 MIL/uL — ABNORMAL LOW (ref 4.22–5.81)
RDW: 21.4 % — ABNORMAL HIGH (ref 11.5–15.5)
WBC: 24.2 10*3/uL — AB (ref 4.0–10.5)

## 2014-08-25 LAB — I-STAT TROPONIN, ED: Troponin i, poc: 0.01 ng/mL (ref 0.00–0.08)

## 2014-08-25 LAB — BRAIN NATRIURETIC PEPTIDE: B Natriuretic Peptide: 224.5 pg/mL — ABNORMAL HIGH (ref 0.0–100.0)

## 2014-08-25 LAB — LACTATE DEHYDROGENASE: LDH: 191 U/L (ref 98–192)

## 2014-08-25 LAB — I-STAT CG4 LACTIC ACID, ED: Lactic Acid, Venous: 1.37 mmol/L (ref 0.5–2.0)

## 2014-08-25 MED ORDER — ACETAMINOPHEN 500 MG PO TABS: 500.0000 mg | ORAL_TABLET | Freq: Every evening | ORAL | Status: DC | PRN

## 2014-08-25 MED ORDER — VANCOMYCIN HCL IN DEXTROSE 1-5 GM/200ML-% IV SOLN
1000.0000 mg | Freq: Once | INTRAVENOUS | Status: AC
  Administered 2014-08-25: 1000 mg via INTRAVENOUS
  Filled 2014-08-25: qty 200

## 2014-08-25 MED ORDER — ACETAMINOPHEN 650 MG RE SUPP: 650.0000 mg | Freq: Four times a day (QID) | RECTAL | Status: DC | PRN

## 2014-08-25 MED ORDER — ATORVASTATIN CALCIUM 10 MG PO TABS
10.0000 mg | ORAL_TABLET | Freq: Every day | ORAL | Status: DC
  Administered 2014-08-25 – 2014-09-13 (×20): 10 mg via ORAL
  Filled 2014-08-25 (×21): qty 1

## 2014-08-25 MED ORDER — DEXTROSE 5 % IV SOLN
2.0000 g | Freq: Two times a day (BID) | INTRAVENOUS | Status: DC
  Administered 2014-08-25 – 2014-09-03 (×19): 2 g via INTRAVENOUS
  Filled 2014-08-25 (×22): qty 2

## 2014-08-25 MED ORDER — NITROGLYCERIN 0.4 MG SL SUBL: 0.4000 mg | SUBLINGUAL_TABLET | SUBLINGUAL | Status: DC | PRN

## 2014-08-25 MED ORDER — DILTIAZEM HCL ER COATED BEADS 180 MG PO CP24
180.0000 mg | ORAL_CAPSULE | Freq: Every day | ORAL | Status: DC
  Administered 2014-08-25 – 2014-08-27 (×3): 180 mg via ORAL
  Filled 2014-08-25 (×4): qty 1

## 2014-08-25 MED ORDER — ONDANSETRON HCL 4 MG PO TABS: 4.0000 mg | ORAL_TABLET | Freq: Four times a day (QID) | ORAL | Status: DC | PRN

## 2014-08-25 MED ORDER — TRAMADOL HCL 50 MG PO TABS
50.0000 mg | ORAL_TABLET | Freq: Two times a day (BID) | ORAL | Status: DC | PRN
  Administered 2014-08-25 – 2014-09-14 (×15): 50 mg via ORAL
  Filled 2014-08-25 (×16): qty 1

## 2014-08-25 MED ORDER — VANCOMYCIN HCL IN DEXTROSE 750-5 MG/150ML-% IV SOLN
750.0000 mg | Freq: Two times a day (BID) | INTRAVENOUS | Status: DC
  Administered 2014-08-25 – 2014-08-28 (×6): 750 mg via INTRAVENOUS
  Filled 2014-08-25 (×10): qty 150

## 2014-08-25 MED ORDER — MONTELUKAST SODIUM 10 MG PO TABS
10.0000 mg | ORAL_TABLET | Freq: Every day | ORAL | Status: DC
  Administered 2014-08-25 – 2014-09-14 (×21): 10 mg via ORAL
  Filled 2014-08-25 (×21): qty 1

## 2014-08-25 MED ORDER — DIPHENHYDRAMINE HCL 25 MG PO CAPS
25.0000 mg | ORAL_CAPSULE | Freq: Every evening | ORAL | Status: DC | PRN
  Administered 2014-08-27 – 2014-09-13 (×12): 25 mg via ORAL
  Filled 2014-08-25 (×12): qty 1

## 2014-08-25 MED ORDER — ALBUTEROL SULFATE (2.5 MG/3ML) 0.083% IN NEBU
2.5000 mg | INHALATION_SOLUTION | Freq: Four times a day (QID) | RESPIRATORY_TRACT | Status: DC | PRN
  Administered 2014-09-08: 2.5 mg via RESPIRATORY_TRACT
  Filled 2014-08-25: qty 3

## 2014-08-25 MED ORDER — ALBUTEROL SULFATE HFA 108 (90 BASE) MCG/ACT IN AERS
2.0000 | INHALATION_SPRAY | Freq: Once | RESPIRATORY_TRACT | Status: AC
  Administered 2014-08-25: 2 via RESPIRATORY_TRACT
  Filled 2014-08-25: qty 6.7

## 2014-08-25 MED ORDER — ONDANSETRON HCL 4 MG/2ML IJ SOLN: 4.0000 mg | Freq: Four times a day (QID) | INTRAMUSCULAR | Status: DC | PRN

## 2014-08-25 MED ORDER — TIOTROPIUM BROMIDE MONOHYDRATE 18 MCG IN CAPS
18.0000 ug | ORAL_CAPSULE | Freq: Every day | RESPIRATORY_TRACT | Status: DC
  Administered 2014-08-26 – 2014-09-02 (×8): 18 ug via RESPIRATORY_TRACT
  Filled 2014-08-25 (×2): qty 5

## 2014-08-25 MED ORDER — IOHEXOL 300 MG/ML  SOLN
75.0000 mL | Freq: Once | INTRAMUSCULAR | Status: AC | PRN
  Administered 2014-08-25: 75 mL via INTRAVENOUS

## 2014-08-25 MED ORDER — METOPROLOL TARTRATE 25 MG PO TABS
25.0000 mg | ORAL_TABLET | Freq: Two times a day (BID) | ORAL | Status: DC
  Administered 2014-08-25 – 2014-08-27 (×5): 25 mg via ORAL
  Filled 2014-08-25 (×7): qty 1

## 2014-08-25 MED ORDER — DIPHENHYDRAMINE-APAP (SLEEP) 25-500 MG PO TABS: 1.0000 | ORAL_TABLET | Freq: Every evening | ORAL | Status: DC | PRN

## 2014-08-25 MED ORDER — BUDESONIDE-FORMOTEROL FUMARATE 160-4.5 MCG/ACT IN AERO
2.0000 | INHALATION_SPRAY | Freq: Two times a day (BID) | RESPIRATORY_TRACT | Status: DC
  Administered 2014-08-25 – 2014-09-03 (×19): 2 via RESPIRATORY_TRACT
  Filled 2014-08-25 (×2): qty 6

## 2014-08-25 MED ORDER — ACETAMINOPHEN 325 MG PO TABS
650.0000 mg | ORAL_TABLET | Freq: Four times a day (QID) | ORAL | Status: DC | PRN
  Administered 2014-08-27 – 2014-08-28 (×2): 650 mg via ORAL
  Administered 2014-09-02 (×2): 325 mg via ORAL
  Administered 2014-09-03 – 2014-09-13 (×7): 650 mg via ORAL
  Filled 2014-08-25 (×12): qty 2

## 2014-08-25 MED ORDER — LOSARTAN POTASSIUM 50 MG PO TABS
50.0000 mg | ORAL_TABLET | Freq: Every day | ORAL | Status: DC
  Administered 2014-08-25 – 2014-08-27 (×3): 50 mg via ORAL
  Filled 2014-08-25 (×3): qty 1

## 2014-08-25 NOTE — Progress Notes (Signed)
NURSING PROGRESS NOTE  Johnathan Strong [id number] Admission Data: 08/25/2014 8:26 PM Attending Provider: Domenic Polite, MD QIH:[id number], Elenore Rota, MD Code Status: DNR  Johnathan Strong is a 81 y.o. male patient admitted from ED:  -No acute distress noted.  -No complaints of shortness of breath.  -No complaints of chest pain.   Blood pressure 172/56, pulse 78, temperature 98.6 F (37 C), temperature source Oral, resp. rate 18, height 5\' 8"  (1.727 m), weight 74.844 kg (165 lb), SpO2 96 %.   IV Fluids:  IV in place, occlusive dsg intact without redness, IV cath antecubital left, condition patent and no redness normal saline.   Allergies:  Aspirin; Nsaids; Septra; Albuterol; Benicar; Salicylates; and Ampicillin  Past Medical History:   has a past medical history of Osteopenia (2006); Hyperlipidemia; BPH (benign prostatic hypertrophy); Transient global amnesia; Osteoarthritis; History of TIA (transient ischemic attack) (1980'S); COPD (chronic obstructive pulmonary disease) with emphysema; Chronic back pain; Itching (SECONDARY TO CLL-- CONTROLLED W/ SINGULAIR); Frequency; Nocturia; Hypertension (CARDIOLOGIST- DR BRACKBILL-- LAST VISIT 12-15-2010 NOTE IN EPIC); CLL (chronic lymphocytic leukemia) (LAST PLT COUNT APRL 2012  168); Bladder cancer; Aspirin allergy; Asthma; Dyspnea on exertion; and CAD (coronary artery disease).  Past Surgical History:   has past surgical history that includes Excisional hemorrhoidectomy (1980); Cardiovascular stress test (07/2005); Transurethral resection of bladder tumor (10-19-2008); Cataract extraction w/ intraocular lens  implant, bilateral (Bilateral, ~ 2007); transthoracic echocardiogram (01-03-2011); Cystoscopy with biopsy (10/30/2011); Cardiac catheterization (08/27/2012); Coronary angioplasty with stent (09/26/2012); Coronary stent placement (09/26/12); left heart catheterization with coronary angiogram (N/A, 08/27/2012); and percutaneous coronary stent intervention (pci-s)  (N/A, 09/26/2012).  Social History:   reports that he quit smoking about 54 years ago. His smoking use included Cigarettes. He has a 25 pack-year smoking history. He quit smokeless tobacco use about 35 years ago. He reports that he drinks alcohol. He reports that he does not use illicit drugs.  Skin: Intact  Patient/Family orientated to room. Information packet given to patient/family. Admission inpatient armband information verified with patient/family to include name and date of birth and placed on patient arm. Side rails up x 2, fall assessment and education completed with patient/family. Patient/family able to verbalize understanding of risk associated with falls and verbalized understanding to call for assistance before getting out of bed. Call light within reach. Patient/family able to voice and demonstrate understanding of unit orientation instructions.    Will continue to evaluate and treat per MD orders.

## 2014-08-25 NOTE — Progress Notes (Signed)
Utilization review completed.  

## 2014-08-25 NOTE — ED Provider Notes (Signed)
CSN: [id number]     Arrival date & time 08/25/14  9622 History   First MD Initiated Contact with Patient 08/25/14 [phone]     Chief Complaint  Patient presents with  . Shortness of Breath  . Chest Pain    right sided     (Consider location/radiation/quality/duration/timing/severity/associated sxs/prior Treatment) Patient is a 81 y.o. male presenting with shortness of breath and chest pain. The history is provided by the patient. No language interpreter was used.  Shortness of Breath Severity:  Moderate Onset quality:  Sudden Duration:  1 day Timing:  Constant Progression:  Improving Chronicity:  Recurrent Relieved by:  Rest Worsened by:  Movement Ineffective treatments:  Rest Associated symptoms: chest pain and fever   Associated symptoms: no abdominal pain, no cough, no diaphoresis, no headaches, no rash and no vomiting   Associated symptoms comment:  Subjective fevers and chills Chest pain:    Quality:  Pressure   Severity:  Moderate   Onset quality:  Sudden   Timing:  Intermittent   Progression:  Resolved   Chronicity:  New Risk factors: no hx of PE/DVT, no obesity and no tobacco use   Chest Pain Pain location:  Substernal area Pain quality: pressure   Pain radiates to:  R shoulder Pain radiates to the back: no   Pain severity:  Moderate Onset quality:  Sudden Timing:  Intermittent Progression:  Waxing and waning Chronicity:  New Context: at rest   Relieved by:  Nothing Worsened by:  Nothing tried Associated symptoms: fever and shortness of breath   Associated symptoms: no abdominal pain, no back pain, no cough, no diaphoresis, no dizziness, no dysphagia, no fatigue, no headache, no lower extremity edema, no nausea, no numbness, not vomiting and no weakness   Risk factors: coronary artery disease, high cholesterol, hypertension and male sex   Risk factors: no prior DVT/PE and no smoking     Past Medical History  Diagnosis Date  . Osteopenia 2006  .  Hyperlipidemia   . BPH (benign prostatic hypertrophy)   . Transient global amnesia   . Osteoarthritis   . History of TIA (transient ischemic attack) 1980'S    NO RESIDUAL  . COPD (chronic obstructive pulmonary disease) with emphysema   . Chronic back pain   . Itching SECONDARY TO CLL-- CONTROLLED W/ SINGULAIR  . Frequency   . Nocturia   . Hypertension CARDIOLOGIST- DR BRACKBILL-- LAST VISIT 12-15-2010 NOTE IN EPIC  . CLL (chronic lymphocytic leukemia) LAST PLT COUNT APRL 2012  168    Managed at [facility]  . Bladder cancer   . Aspirin allergy     Eyes swell  . Asthma   . Dyspnea on exertion   . CAD (coronary artery disease)     a. 08/2012 Cath: LM nl, LAD 90p, LCX 20-30, RCA 100ost fills via L->R collats, EF 55-65%;  b. 09/2012 PCI of prox LAD with 3.0x16 Promus DES.   Past Surgical History  Procedure Laterality Date  . Excisional hemorrhoidectomy  1980  . Cardiovascular stress test  07/2005    a. 07/2005- no reversible ischemia, normal EF b. 06/2009- EF 73%, no reversible ischemia, inferolateral TWIs at rest, upright in recovery c. 08/2012- mediuim-sized partially reversible basal to mid inferior and inferoseptal perfusion defect c/w with prior infarction and peri-infarct ischemia; EF 68% and borderline ST/T changes on stress ECG   . Transurethral resection of bladder tumor  10-19-2008    AND DILATION URETHRAL STRICTURE  .  Cataract extraction w/ intraocular lens  implant, bilateral Bilateral ~ 2007  . Transthoracic echocardiogram  01-03-2011    LVEF 73-22%, grade 1 diastolic dysfunction, no WMAs or structural abnormalities  . Cystoscopy with biopsy  10/30/2011    Procedure: CYSTOSCOPY WITH BIOPSY;  Surgeon: Claybon Jabs, MD;  Location: [facility];  Service: Urology;  Laterality: N/A;  . Cardiac catheterization  08/27/2012  . Coronary angioplasty with stent placement  09/26/2012    "1" (09/26/2012)  . Coronary stent placement  09/26/12  . Left heart  catheterization with coronary angiogram N/A 08/27/2012    Procedure: LEFT HEART CATHETERIZATION WITH CORONARY ANGIOGRAM;  Surgeon: Peter M Martinique, MD;  Location: [facility] CATH LAB;  Service: Cardiovascular;  Laterality: N/A;  . Percutaneous coronary stent intervention (pci-s) N/A 09/26/2012    Procedure: PERCUTANEOUS CORONARY STENT INTERVENTION (PCI-S);  Surgeon: Peter M Martinique, MD;  Location: [facility] CATH LAB;  Service: Cardiovascular;  Laterality: N/A;   Family History  Problem Relation Age of Onset  . Emphysema Father   . Heart disease Father   . COPD Father   . Atrial fibrillation Father   . Kidney cancer Mother   . Kidney failure Mother    History  Substance Use Topics  . Smoking status: Former Smoker -- 1.00 packs/day for 25 years    Types: Cigarettes    Quit date: 03/06/1960  . Smokeless tobacco: Former Systems developer    Quit date: 10/25/1978  . Alcohol Use: 0.0 oz/week    0 Standard drinks or equivalent per week     Comment: 09/26/2012 "glass of wine or mixed drink once/month"    Review of Systems  Constitutional: Positive for fever. Negative for diaphoresis, activity change, appetite change and fatigue.  HENT: Negative for congestion, facial swelling, rhinorrhea and trouble swallowing.   Eyes: Negative for photophobia and pain.  Respiratory: Positive for shortness of breath. Negative for cough and chest tightness.   Cardiovascular: Positive for chest pain. Negative for leg swelling.  Gastrointestinal: Negative for nausea, vomiting, abdominal pain, diarrhea and constipation.  Endocrine: Negative for polydipsia and polyuria.  Genitourinary: Negative for dysuria, urgency, decreased urine volume and difficulty urinating.  Musculoskeletal: Negative for back pain and gait problem.  Skin: Negative for color change, rash and wound.  Allergic/Immunologic: Negative for immunocompromised state.  Neurological: Negative for dizziness, facial asymmetry, speech difficulty, weakness, numbness and headaches.   Psychiatric/Behavioral: Negative for confusion, decreased concentration and agitation.      Allergies  Aspirin; Nsaids; Septra; Albuterol; Benicar; Salicylates; and Ampicillin  Home Medications   Prior to Admission medications   Medication Sig Start Date End Date Taking? Authorizing Provider  albuterol (VENTOLIN HFA) 108 (90 BASE) MCG/ACT inhaler Inhale 2 puffs into the lungs every 6 (six) hours as needed for wheezing.     Historical Provider, MD  Azelastine HCl (ASTELIN NA) Place 1 spray into the nose as needed (for congestion).     Historical Provider, MD  cefUROXime (CEFTIN) 250 MG tablet Take 1 tablet (250 mg total) by mouth 2 (two) times daily with a meal. 08/13/14   Kelvin Cellar, MD  Cholecalciferol (VITAMIN D3) 1000 UNITS CAPS Take 1 capsule by mouth daily.     Historical Provider, MD  cimetidine (TAGAMET) 300 MG tablet TAKE 1 TABLET UP TO 4 TIMES A DAY Patient taking differently: TAKE 1 TABLET UP TO 4 TIMES A DAY as needed 01/02/13   Chipper Herb, MD  clopidogrel (PLAVIX) 75 MG tablet TAKE 1 TABLET ONCE A DAY  03/12/14   [name], MD  diltiazem (CARDIZEM CD) 180 MG 24 hr capsule Take 1 capsule (180 mg total) by mouth daily. 08/13/14   Kelvin Cellar, MD  diphenhydramine-acetaminophen (TYLENOL PM) 25-500 MG TABS Take 1 tablet by mouth at bedtime as needed. sleep    Historical Provider, MD  ELIQUIS 5 MG TABS tablet TAKE  (1)  TABLET TWICE A DAY. 08/14/14   Chipper Herb, MD  fluticasone (FLONASE) 50 MCG/ACT nasal spray USE 2 SPRAYS INTO EACH NOSTRIL ONCE DAILY Patient taking differently: USE 2 SPRAYS INTO EACH NOSTRIL ONCE DAILY as needed for allergies. 07/07/14   Chipper Herb, MD  furosemide (LASIX) 20 MG tablet TAKE (1) TABLET DAILY AS DIRECTED. Patient taking differently: TAKE (1) TABLET DAILY AS DIRECTED as needed for swelling 02/03/14   Chipper Herb, MD  LIPITOR 10 MG tablet TAKE 1 TABLET DAILY Patient taking differently: TAKE 1 TABLET DAILY in the evening. 04/13/14    Chipper Herb, MD  losartan (COZAAR) 50 MG tablet TAKE (1) TABLET TWICE A DAY. 03/13/14   Chipper Herb, MD  metoprolol tartrate (LOPRESSOR) 25 MG tablet Take 1 tablet (25 mg total) by mouth 2 (two) times daily. 05/19/14   Chipper Herb, MD  montelukast (SINGULAIR) 10 MG tablet TAKE ONE TABLET AT BEDTIME 01/28/14   Chipper Herb, MD  nitroGLYCERIN (NITROSTAT) 0.4 MG SL tablet Place 1 tablet (0.4 mg total) under the tongue every 5 (five) minutes as needed for chest pain. 09/27/12   Rogelia Mire, NP  Omega-3 Fatty Acids (FISH OIL) 1200 MG CAPS Take 1 capsule by mouth daily.     Historical Provider, MD  SPIRIVA HANDIHALER 18 MCG inhalation capsule INHALE THE CONTENTS OF ONE CAPSULE ONCE DAILY AS DIRECTED 05/25/14   Chipper Herb, MD  SYMBICORT 160-4.5 MCG/ACT inhaler 2 PUFFS EVERY 12 HOURS 06/17/14   Chipper Herb, MD  traMADol (ULTRAM) 50 MG tablet TAKE 1 TABLET UP TO 2 TIMES A DAY 07/14/14   Chipper Herb, MD  triamcinolone cream (KENALOG) 0.1 % APPLY TO AFFECTED AREAS 2 TIMES A DAY AS NEEDED 01/02/13   Chipper Herb, MD   BP 188/73 mmHg  Pulse 105  Temp(Src) 98.7 F (37.1 C) (Oral)  Resp 30  Ht 5\' 8"  (1.727 m)  Wt 165 lb (74.844 kg)  BMI 25.09 kg/m2  SpO2 96% Physical Exam  Constitutional: He is oriented to person, place, and time. He appears well-developed and well-nourished. No distress.  HENT:  Head: Normocephalic and atraumatic.  Mouth/Throat: No oropharyngeal exudate.  Eyes: Pupils are equal, round, and reactive to light.  Neck: Normal range of motion. Neck supple.  Cardiovascular: Normal rate, regular rhythm and normal heart sounds.  Exam reveals no gallop and no friction rub.   No murmur heard. Pulmonary/Chest: Effort normal. No respiratory distress. He has decreased breath sounds in the right lower field. He has no wheezes. He has no rales.  Abdominal: Soft. Bowel sounds are normal. He exhibits no distension and no mass. There is no tenderness. There is no rebound and no  guarding.  Musculoskeletal: Normal range of motion. He exhibits no edema or tenderness.  Neurological: He is alert and oriented to person, place, and time.  Skin: Skin is warm and dry.  Psychiatric: He has a normal mood and affect.    ED Course  Procedures (including critical care time) Labs Review Labs Reviewed  CULTURE, BLOOD (ROUTINE X 2)  CULTURE, BLOOD (ROUTINE X 2)  CBC WITH DIFFERENTIAL/PLATELET  COMPREHENSIVE METABOLIC PANEL  BRAIN NATRIURETIC PEPTIDE  URINALYSIS, ROUTINE W REFLEX MICROSCOPIC (NOT AT [facility])  I-STAT CG4 LACTIC ACID, ED  I-STAT TROPOININ, ED    Imaging Review No results found.   EKG Interpretation   Date/Time:  Tuesday August 25 2014 06:39:54 EDT Ventricular Rate:  98 PR Interval:  201 QRS Duration: 92 QT Interval:  332 QTC Calculation: 424 R Axis:   63 Text Interpretation:  Sinus rhythm Atrial premature complex Borderline  repolarization abnormality Baseline wander in lead(s) V1 V2 V3 ST  depression throughout, more pronounced  Confirmed by DOCHERTY  MD, MEGAN  ([id number]) on 08/25/2014 7:09:47 AM      MDM   Final diagnoses:  SOB (shortness of breath)    Pt is a 81 y.o. male with Pmhx as above who presents with onset of shortness of breath, chest tightness and right shoulder pain since yesterday.  He was reported to be 89% on RA at home  He is also had subjective fevers and chills since yesterday.  He finished by mouth Ceftin, greater than 1 week ago, since being discharged for COPD/pneumonia admission.  He denies current chest pain.  States he feels improved, on nasal cannula, denies leg pain or swelling.  On physical exam, vital signs are stable.  Patient is in no acute distress on 2 L nasal cannula.  He has decreased breath sounds in the right lower lung fields.  No lower extremity edema.  EKG with diffuse ST depressions.    CXR with continued R pleural effusion with assoc R lung base collapse/consolidation. Will start tx for HCAP, will admit to  triad. Pt may need thoracentesis, but not emergently.      Ernestina Patches, MD 08/26/14 1002

## 2014-08-25 NOTE — ED Notes (Addendum)
EMS - Patient coming from home with c/o of SOB ongoing since last night with associated right sided chest pain with radiation to the right shoulder and chills.  Patient was discharged a week ago with diagnosis of pneumonia.  Family at bedside.  Intial O2 with fire was 89%, placed on 15L non-rebreather, EMS had patient on nasal cannula at 3L with O2 of 96%.  Hx of A. Fib.

## 2014-08-25 NOTE — ED Notes (Signed)
Patient transported to X-ray 

## 2014-08-25 NOTE — ED Notes (Signed)
Dr. Lita Mains at bedside with patient and family.

## 2014-08-25 NOTE — H&P (Signed)
[facility] History and Physical  EULISES KIJOWSKI FWY:[id number] DOB: 01-29-1924 DOA: 08/25/2014  Referring physician: EDP PCP: Redge Gainer, MD   Chief Complaint: Shortness of breath  HPI: Johnathan Strong is a 81 y.o. male with past history of COPD, paroxysmal A. fib on Eliquis, CAD, hypertension was recently discharged from [facility] 6/9 after treatment for community-acquired pneumonia. He completed his course of Ceftin at home and subsequently did well for 10 days. Yesterday his wife noticed that he was sleeping a lot more than usual, felt very weak. At night he had some chills and felt feverish. This morning he had increased dyspnea and pain in his right shoulder and across the right side of his chest which was pleuritic.  He presented to the ER where he was noted to have a white count of 24K, chest x-ray with worsening pneumonia and moderate right pleural effusion. Had blood cultures drawn, started on IV vancomycin and ceftazidime  Review of Systems: Positives bolded Constitutional:  No weight loss, night sweats, Fevers, chills, fatigue.  HEENT:  No headaches, Difficulty swallowing,Tooth/dental problems,Sore throat,  No sneezing, itching, ear ache, nasal congestion, post nasal drip,  Cardio-vascular:  No chest pain, Orthopnea, PND, swelling in lower extremities, anasarca, dizziness, palpitations  GI:  No heartburn, indigestion, abdominal pain, nausea, vomiting, diarrhea, change in bowel habits, loss of appetite  Resp:  No shortness of breath with exertion or at rest. No excess mucus, no productive cough, No non-productive cough, No coughing up of blood.No change in color of mucus.No wheezing.No chest wall deformity  Skin:  no rash or lesions.  GU:  no dysuria, change in color of urine, no urgency or frequency. No flank pain.  Musculoskeletal:  No joint pain or swelling. No decreased range of motion. No back pain.  Psych:  No change in mood or affect. No  depression or anxiety. No memory loss.   Past Medical History  Diagnosis Date  . Osteopenia 2006  . Hyperlipidemia   . BPH (benign prostatic hypertrophy)   . Transient global amnesia   . Osteoarthritis   . History of TIA (transient ischemic attack) 1980'S    NO RESIDUAL  . COPD (chronic obstructive pulmonary disease) with emphysema   . Chronic back pain   . Itching SECONDARY TO CLL-- CONTROLLED W/ SINGULAIR  . Frequency   . Nocturia   . Hypertension CARDIOLOGIST- DR BRACKBILL-- LAST VISIT 12-15-2010 NOTE IN EPIC  . CLL (chronic lymphocytic leukemia) LAST PLT COUNT APRL 2012  168    Managed at [place]  . Bladder cancer   . Aspirin allergy     Eyes swell  . Asthma   . Dyspnea on exertion   . CAD (coronary artery disease)     a. 08/2012 Cath: LM nl, LAD 90p, LCX 20-30, RCA 100ost fills via L->R collats, EF 55-65%;  b. 09/2012 PCI of prox LAD with 3.0x16 Promus DES.   Past Surgical History  Procedure Laterality Date  . Excisional hemorrhoidectomy  1980  . Cardiovascular stress test  07/2005    a. 07/2005- no reversible ischemia, normal EF b. 06/2009- EF 73%, no reversible ischemia, inferolateral TWIs at rest, upright in recovery c. 08/2012- mediuim-sized partially reversible basal to mid inferior and inferoseptal perfusion defect c/w with prior infarction and peri-infarct ischemia; EF 68% and borderline ST/T changes on stress ECG   . Transurethral resection of bladder tumor  10-19-2008    AND DILATION URETHRAL STRICTURE  . Cataract extraction  w/ intraocular lens  implant, bilateral Bilateral ~ 2007  . Transthoracic echocardiogram  01-03-2011    LVEF 53-66%, grade 1 diastolic dysfunction, no WMAs or structural abnormalities  . Cystoscopy with biopsy  10/30/2011    Procedure: CYSTOSCOPY WITH BIOPSY;  Surgeon: Claybon Jabs, MD;  Location: [facility];  Service: Urology;  Laterality: N/A;  . Cardiac catheterization  08/27/2012  . Coronary angioplasty with  stent placement  09/26/2012    "1" (09/26/2012)  . Coronary stent placement  09/26/12  . Left heart catheterization with coronary angiogram N/A 08/27/2012    Procedure: LEFT HEART CATHETERIZATION WITH CORONARY ANGIOGRAM;  Surgeon: Peter M Martinique, MD;  Location: [facility] CATH LAB;  Service: Cardiovascular;  Laterality: N/A;  . Percutaneous coronary stent intervention (pci-s) N/A 09/26/2012    Procedure: PERCUTANEOUS CORONARY STENT INTERVENTION (PCI-S);  Surgeon: Peter M Martinique, MD;  Location: [facility] CATH LAB;  Service: Cardiovascular;  Laterality: N/A;   Social History:  reports that he quit smoking about 54 years ago. His smoking use included Cigarettes. He has a 25 pack-year smoking history. He quit smokeless tobacco use about 35 years ago. He reports that he drinks alcohol. He reports that he does not use illicit drugs.  Allergies  Allergen Reactions  . Aspirin Anaphylaxis, Shortness Of Breath and Swelling  . Nsaids Anaphylaxis, Shortness Of Breath and Swelling    Raise BP  . Septra [Sulfamethoxazole-Trimethoprim] Rash  . Albuterol     Can not tolerate high/ frequent doses due to [place] with using HFA PRN  . Benicar [Olmesartan Medoxomil]     dizzy  . Salicylates     SOB  . Ampicillin Rash    Family History  Problem Relation Age of Onset  . Emphysema Father   . Heart disease Father   . COPD Father   . Atrial fibrillation Father   . Kidney cancer Mother   . Kidney failure Mother      Prior to Admission medications   Medication Sig Start Date End Date Taking? Authorizing Provider  albuterol (VENTOLIN HFA) 108 (90 BASE) MCG/ACT inhaler Inhale 2 puffs into the lungs every 6 (six) hours as needed for wheezing.    Yes Historical Provider, MD  Azelastine HCl (ASTELIN NA) Place 1 spray into the nose as needed (for congestion).    Yes Historical Provider, MD  Cholecalciferol (VITAMIN D3) 1000 UNITS CAPS Take 1 capsule by mouth daily.    Yes Historical Provider, MD  cimetidine (TAGAMET) 300 MG  tablet TAKE 1 TABLET UP TO 4 TIMES A DAY Patient taking differently: TAKE 1 TABLET UP TO 4 TIMES A DAY as needed 01/02/13  Yes Chipper Herb, MD  clopidogrel (PLAVIX) 75 MG tablet TAKE 1 TABLET ONCE A DAY 03/12/14  Yes [name], MD  diltiazem (CARDIZEM CD) 180 MG 24 hr capsule Take 1 capsule (180 mg total) by mouth daily. 08/13/14  Yes Kelvin Cellar, MD  diphenhydramine-acetaminophen (TYLENOL PM) 25-500 MG TABS Take 1 tablet by mouth at bedtime as needed. sleep   Yes Historical Provider, MD  ELIQUIS 5 MG TABS tablet TAKE  (1)  TABLET TWICE A DAY. 08/14/14  Yes Chipper Herb, MD  fluticasone (FLONASE) 50 MCG/ACT nasal spray USE 2 SPRAYS INTO EACH NOSTRIL ONCE DAILY Patient taking differently: USE 2 SPRAYS INTO EACH NOSTRIL ONCE DAILY as needed for allergies. 07/07/14  Yes Chipper Herb, MD  furosemide (LASIX) 20 MG tablet TAKE (1) TABLET DAILY AS DIRECTED. Patient taking differently:  TAKE (1) TABLET DAILY AS DIRECTED as needed for swelling 02/03/14  Yes Chipper Herb, MD  LIPITOR 10 MG tablet TAKE 1 TABLET DAILY Patient taking differently: TAKE 1 TABLET DAILY in the evening. 04/13/14  Yes Chipper Herb, MD  losartan (COZAAR) 50 MG tablet TAKE (1) TABLET TWICE A DAY. 03/13/14  Yes Chipper Herb, MD  metoprolol tartrate (LOPRESSOR) 25 MG tablet Take 1 tablet (25 mg total) by mouth 2 (two) times daily. 05/19/14  Yes Chipper Herb, MD  montelukast (SINGULAIR) 10 MG tablet TAKE ONE TABLET AT BEDTIME 01/28/14  Yes Chipper Herb, MD  nitroGLYCERIN (NITROSTAT) 0.4 MG SL tablet Place 1 tablet (0.4 mg total) under the tongue every 5 (five) minutes as needed for chest pain. 09/27/12  Yes Rogelia Mire, NP  Omega-3 Fatty Acids (FISH OIL) 1200 MG CAPS Take 1 capsule by mouth daily.    Yes Historical Provider, MD  SPIRIVA HANDIHALER 18 MCG inhalation capsule INHALE THE CONTENTS OF ONE CAPSULE ONCE DAILY AS DIRECTED 05/25/14  Yes Chipper Herb, MD  SYMBICORT 160-4.5 MCG/ACT inhaler 2 PUFFS EVERY 12 HOURS  06/17/14  Yes Chipper Herb, MD  traMADol (ULTRAM) 50 MG tablet TAKE 1 TABLET UP TO 2 TIMES A DAY 07/14/14  Yes Chipper Herb, MD  triamcinolone cream (KENALOG) 0.1 % APPLY TO AFFECTED AREAS 2 TIMES A DAY AS NEEDED 01/02/13  Yes Chipper Herb, MD  cefUROXime (CEFTIN) 250 MG tablet Take 1 tablet (250 mg total) by mouth 2 (two) times daily with a meal. Patient not taking: Reported on 08/25/2014 08/13/14   Kelvin Cellar, MD   Physical Exam: Filed Vitals:   08/25/14 0745 08/25/14 0830 08/25/14 0837 08/25/14 0900  BP: 143/51 149/48    Pulse: 91 96  90  Temp:      TempSrc:      Resp: 24 32 33 24  Height:      Weight:      SpO2: 97% 94% 94% 96%    Wt Readings from Last 3 Encounters:  08/25/14 74.844 kg (165 lb)  08/09/14 73.483 kg (162 lb)  08/05/14 74.753 kg (164 lb 12.8 oz)    General:  Elderly male, appears stated age, no distress, pleasant, AAO 3 Eyes: PERRL, normal lids, irises & conjunctiva ENT: grossly normal hearing, lips & tongue Neck: no LAD, masses or thyromegaly Cardiovascular: RRR, no m/r/g. No LE edema. Telemetry: SR, no arrhythmias  Respiratory: Decreased breath sounds over the right mid and lower lung fields Abdomen: soft, ntnd Skin: no rash or induration seen on limited exam Musculoskeletal: grossly normal tone BUE/BLE Psychiatric: grossly normal mood and affect, speech fluent and appropriate Neurologic: grossly non-focal.          Labs on Admission:  Basic Metabolic Panel:  Recent Labs Lab 08/25/14 0700  NA 137  K 4.2  CL 103  CO2 26  GLUCOSE 172*  BUN 14  CREATININE 0.97  CALCIUM 8.8*   Liver Function Tests:  Recent Labs Lab 08/25/14 0700  AST 19  ALT 18  ALKPHOS 72  BILITOT 1.5*  PROT 6.3*  ALBUMIN 3.8   No results for input(s): LIPASE, AMYLASE in the last 168 hours. No results for input(s): AMMONIA in the last 168 hours. CBC:  Recent Labs Lab 08/25/14 0700  WBC 24.2*  NEUTROABS 17.6*  HGB 12.1*  HCT 37.1*  MCV 92.3  PLT 255    Cardiac Enzymes: No results for input(s): CKTOTAL, CKMB, CKMBINDEX, TROPONINI in the  last 168 hours.  BNP (last 3 results)  Recent Labs  04/21/14 1541 08/25/14 0700  BNP 484.0* 224.5*    ProBNP (last 3 results) No results for input(s): PROBNP in the last 8760 hours.  CBG: No results for input(s): GLUCAP in the last 168 hours.  Radiological Exams on Admission: Dg Chest 2 View  08/25/2014   CLINICAL DATA:  81 year old male with shortness of breath and chest pain since last night. Pain radiating to the right shoulder with chills. Initial encounter. Recent diagnosis of pneumonia.  EXAM: CHEST  2 VIEW  COMPARISON:  08/13/2014 and earlier.  FINDINGS: Seated upright AP and lateral views of the chest. Continued moderate size right pleural effusion with dense right lung base opacification. Lower lung volumes. No pneumothorax or pulmonary edema. Stable cardiac size and mediastinal contours. The left lung remains clear aside from evidence of a chronic lower lobe pulmonary nodule which has not significantly changed since 2013. Calcified atherosclerosis of the aorta again noted. No acute osseous abnormality identified.  IMPRESSION: 1. Continued moderate right pleural effusion with associated right lung base collapse/consolidation. 2. No new cardiopulmonary abnormality. Chronic left lower lobe lung nodule.   Electronically Signed   By: Genevie Ann M.D.   On: 08/25/2014 08:17    EKG: Independently reviewed.few APCs no acute ST T wave changes  Assessment/Plan   HCAP with Pleural effusion on right -recently completed Rx for CAP -now with pleurisy symptoms and worsening dyspnea, could be para pneumonic vs Empyema given leukocytosis -check CT chest -IR consult for Thoracentesis for tomorrow or 6/23: took last dose of Eliquis yesterday and takes plavix, explained that there is a bleeding risk to family -Hold plavix and Eliquis  COPD gold III - continue Symbicort, Spiriva, levalbuterol, montelukast -  no wheezing  P.Afib  -now in NSR, continue BB and diltiazem, hold eliquis as noted above  HTN - continue losartan, metoprolol tartrate, diltiazem  CAD - most recent cath 2014 with DES stenting of his LAD on 09/26/12 - hold plavix due to need for thoracentesis  HLD - continue atorvastatin    Code Status: DNR DVT Prophylaxis: SCDs for now, hold apixaban Family Communication: d/w wife and daughter at bedside Disposition Plan: inpatient  Time spent: 46min  Jalayah Gutridge [facility] Pager [phone]

## 2014-08-25 NOTE — Progress Notes (Signed)
Attempted to get report.  Nurse unavailable and will call in about 10 minutes.

## 2014-08-25 NOTE — ED Notes (Signed)
Report received from Rosine Abe, RN.

## 2014-08-25 NOTE — Progress Notes (Signed)
ANTIBIOTIC CONSULT NOTE - INITIAL  Pharmacy Consult for Vancomycin, Ceftazidime  Indication: HCAP   Allergies  Allergen Reactions  . Aspirin Anaphylaxis, Shortness Of Breath and Swelling  . Nsaids Anaphylaxis, Shortness Of Breath and Swelling    Raise BP  . Septra [Sulfamethoxazole-Trimethoprim] Rash  . Albuterol     Can not tolerate high/ frequent doses due to [place] with using HFA PRN  . Benicar [Olmesartan Medoxomil]     dizzy  . Salicylates     SOB  . Ampicillin Rash    Patient Measurements: Height: 5\' 8"  (172.7 cm) Weight: 165 lb (74.844 kg) IBW/kg (Calculated) : 68.4  Vital Signs: Temp: 98.7 F (37.1 C) (06/21 0646) Temp Source: Oral (06/21 0646) BP: 149/48 mmHg (06/21 0830) Pulse Rate: 90 (06/21 0900) Intake/Output from previous day:   Intake/Output from this shift:    Labs:  Recent Labs  08/25/14 0700  WBC 24.2*  HGB 12.1*  PLT 255  CREATININE 0.97   Estimated Creatinine Clearance: 49.9 mL/min (by C-G formula based on Cr of 0.97). No results for input(s): VANCOTROUGH, VANCOPEAK, VANCORANDOM, GENTTROUGH, GENTPEAK, GENTRANDOM, TOBRATROUGH, TOBRAPEAK, TOBRARND, AMIKACINPEAK, AMIKACINTROU, AMIKACIN in the last 72 hours.   Microbiology: Recent Results (from the past 720 hour(s))  Culture, blood (routine x 2) Call MD if unable to obtain prior to antibiotics being given     Status: None   Collection Time: 08/09/14  9:33 PM  Result Value Ref Range Status   Specimen Description BLOOD RIGHT ANTECUBITAL  Final   Special Requests   Final    BOTTLES DRAWN AEROBIC AND ANAEROBIC [place]   Culture   Final    NO GROWTH 5 DAYS Performed at [facility]    Report Status 08/16/2014 FINAL  Final  Culture, blood (routine x 2) Call MD if unable to obtain prior to antibiotics being given     Status: None   Collection Time: 08/09/14  9:45 PM  Result Value Ref Range Status   Specimen Description BLOOD RIGHT ANTECUBITAL  Final   Special  Requests BOTTLES DRAWN AEROBIC AND ANAEROBIC 5CC  Final   Culture   Final    NO GROWTH 5 DAYS Performed at [facility]    Report Status 08/16/2014 FINAL  Final  Culture, blood (routine x 2)     Status: None (Preliminary result)   Collection Time: 08/25/14  7:00 AM  Result Value Ref Range Status   Specimen Description BLOOD RIGHT ANTECUBITAL  Final   Special Requests BAA 10CC  Final   Culture PENDING  Incomplete   Report Status PENDING  Incomplete  Culture, blood (routine x 2)     Status: None (Preliminary result)   Collection Time: 08/25/14  7:29 AM  Result Value Ref Range Status   Specimen Description BLOOD RIGHT ASSIST CONTROL  Final   Special Requests BOTTLES DRAWN AEROBIC AND ANAEROBIC 5CC  Final   Culture PENDING  Incomplete   Report Status PENDING  Incomplete    Medical History: Past Medical History  Diagnosis Date  . Osteopenia 2006  . Hyperlipidemia   . BPH (benign prostatic hypertrophy)   . Transient global amnesia   . Osteoarthritis   . History of TIA (transient ischemic attack) 1980'S    NO RESIDUAL  . COPD (chronic obstructive pulmonary disease) with emphysema   . Chronic back pain   . Itching SECONDARY TO CLL-- CONTROLLED W/ SINGULAIR  . Frequency   . Nocturia   . Hypertension  CARDIOLOGIST- DR BRACKBILL-- LAST VISIT 12-15-2010 NOTE IN EPIC  . CLL (chronic lymphocytic leukemia) LAST PLT COUNT APRL 2012  168    Managed at [place]  . Bladder cancer   . Aspirin allergy     Eyes swell  . Asthma   . Dyspnea on exertion   . CAD (coronary artery disease)     a. 08/2012 Cath: LM nl, LAD 90p, LCX 20-30, RCA 100ost fills via L->R collats, EF 55-65%;  b. 09/2012 PCI of prox LAD with 3.0x16 Promus DES.    Medications:   (Not in a hospital admission) Assessment: 16 YOM who presented with SOB and chest pain along with chills and fevers since yesterday. He finished a course of oral Ceftin prior to admission. WBC is elevated at 24.2. Pt is  afebrile. Pharmacy consulted to start empiric antibiotics for coverage of HCAP. Patient's SCr is 0.97 which seems to be his baseline. CrCl ~ 50 mL/min   Cultures: 6/21 Blood Cx x2>>  Goal of Therapy:  Vancomycin trough level 15-20 mcg/ml  Plan:  -Ceftazidime 2 gm IV Q 12 hours -Vancomycin IV 1 gm followed by 750 mg IV Q 12 hours  -Monitor CBC, renal fx, cultures and clinical progress -VT at [place], PharmD., BCPS Clinical Pharmacist Pager [phone]

## 2014-08-26 ENCOUNTER — Inpatient Hospital Stay (HOSPITAL_COMMUNITY): Payer: Medicare Other

## 2014-08-26 DIAGNOSIS — J948 Other specified pleural conditions: Secondary | ICD-10-CM

## 2014-08-26 DIAGNOSIS — A419 Sepsis, unspecified organism: Principal | ICD-10-CM

## 2014-08-26 DIAGNOSIS — J189 Pneumonia, unspecified organism: Secondary | ICD-10-CM

## 2014-08-26 LAB — CBC
HCT: 35.7 % — ABNORMAL LOW (ref 39.0–52.0)
Hemoglobin: 11.8 g/dL — ABNORMAL LOW (ref 13.0–17.0)
MCH: 30.5 pg (ref 26.0–34.0)
MCHC: 33.1 g/dL (ref 30.0–36.0)
MCV: 92.2 fL (ref 78.0–100.0)
PLATELETS: 213 10*3/uL (ref 150–400)
RBC: 3.87 MIL/uL — ABNORMAL LOW (ref 4.22–5.81)
RDW: 22 % — AB (ref 11.5–15.5)
WBC: 34 10*3/uL — ABNORMAL HIGH (ref 4.0–10.5)

## 2014-08-26 LAB — BODY FLUID CELL COUNT WITH DIFFERENTIAL
LYMPHS FL: 3 %
Monocyte-Macrophage-Serous Fluid: 4 % — ABNORMAL LOW (ref 50–90)
NEUTROPHIL FLUID: 93 % — AB (ref 0–25)
Total Nucleated Cell Count, Fluid: 10299 cu mm — ABNORMAL HIGH (ref 0–1000)

## 2014-08-26 LAB — COMPREHENSIVE METABOLIC PANEL
ALK PHOS: 66 U/L (ref 38–126)
ALT: 14 U/L — AB (ref 17–63)
AST: 13 U/L — ABNORMAL LOW (ref 15–41)
Albumin: 3.2 g/dL — ABNORMAL LOW (ref 3.5–5.0)
Anion gap: 9 (ref 5–15)
BUN: 16 mg/dL (ref 6–20)
CO2: 28 mmol/L (ref 22–32)
Calcium: 8.7 mg/dL — ABNORMAL LOW (ref 8.9–10.3)
Chloride: 99 mmol/L — ABNORMAL LOW (ref 101–111)
Creatinine, Ser: 1.13 mg/dL (ref 0.61–1.24)
GFR calc non Af Amer: 56 mL/min — ABNORMAL LOW (ref >60)
GLUCOSE: 140 mg/dL — AB (ref 65–99)
POTASSIUM: 5.1 mmol/L (ref 3.5–5.1)
SODIUM: 136 mmol/L (ref 135–145)
Total Bilirubin: 1.4 mg/dL — ABNORMAL HIGH (ref 0.3–1.2)
Total Protein: 5.8 g/dL — ABNORMAL LOW (ref 6.5–8.1)

## 2014-08-26 LAB — GRAM STAIN

## 2014-08-26 LAB — LACTATE DEHYDROGENASE, PLEURAL OR PERITONEAL FLUID: LD, Fluid: 341 U/L — ABNORMAL HIGH (ref 3–23)

## 2014-08-26 LAB — PROTEIN, BODY FLUID: Total protein, fluid: 3.7 g/dL

## 2014-08-26 MED ORDER — LIDOCAINE HCL (PF) 1 % IJ SOLN
INTRAMUSCULAR | Status: AC
  Filled 2014-08-26: qty 5

## 2014-08-26 MED ORDER — OXYCODONE-ACETAMINOPHEN 5-325 MG PO TABS
1.0000 | ORAL_TABLET | ORAL | Status: DC | PRN
  Administered 2014-09-01: 1 via ORAL
  Filled 2014-08-26 (×2): qty 1

## 2014-08-26 MED ORDER — LIDOCAINE HCL (PF) 1 % IJ SOLN
INTRAMUSCULAR | Status: AC
  Filled 2014-08-26: qty 10

## 2014-08-26 NOTE — Progress Notes (Addendum)
[place] for Vancomycin, Ceftazidime  Indication: HCAP  Assessment: 67 YOM who presented with SOB and chest pain along with chills and fevers since yesterday. He finished a course of oral Ceftin prior to admission. WBC is elevated at 24.2. Pt is afebrile. Pharmacy consulted to start empiric antibiotics for coverage of HCAP. Patient's SCr is 0.97 which seems to be his baseline. CrCl ~ 50 mL/min   Cultures: 6/21 Blood Cx x2>>  Now s/p thoracentesis  Goal of Therapy:  Vancomycin trough level 15-20 mcg/ml  Plan:  -Ceftazidime 2 gm IV Q 12 hours -Vancomycin IV 1 gm followed by 750 mg IV Q 12 hours  -Monitor CBC, renal fx, cultures and clinical progress -VT at [facility] - Resume Eliquis? - Consider holding losartan for elevated K?     Labs:  Recent Labs  08/25/14 0700 08/26/14 0448  WBC 24.2* 34.0*  HGB 12.1* 11.8*  PLT 255 213  CREATININE 0.97 1.13   Estimated Creatinine Clearance: 42.9 mL/min (by C-G formula based on Cr of 1.13). No results for input(s): VANCOTROUGH, VANCOPEAK, VANCORANDOM, GENTTROUGH, GENTPEAK, GENTRANDOM, TOBRATROUGH, TOBRAPEAK, TOBRARND, AMIKACINPEAK, AMIKACINTROU, AMIKACIN in the last 72 hours.   Microbiology: Recent Results (from the past 720 hour(s))  Culture, blood (routine x 2) Call MD if unable to obtain prior to antibiotics being given     Status: None   Collection Time: 08/09/14  9:33 PM  Result Value Ref Range Status   Specimen Description BLOOD RIGHT ANTECUBITAL  Final   Special Requests   Final    BOTTLES DRAWN AEROBIC AND ANAEROBIC [place]   Culture   Final    NO GROWTH 5 DAYS Performed at [facility]    Report Status 08/16/2014 FINAL  Final  Culture, blood (routine x 2) Call MD if unable to obtain prior to antibiotics being given     Status: None   Collection Time: 08/09/14  9:45 PM  Result Value Ref Range Status   Specimen Description BLOOD RIGHT ANTECUBITAL  Final   Special Requests  BOTTLES DRAWN AEROBIC AND ANAEROBIC 5CC  Final   Culture   Final    NO GROWTH 5 DAYS Performed at [facility]    Report Status 08/16/2014 FINAL  Final  Culture, blood (routine x 2)     Status: None (Preliminary result)   Collection Time: 08/25/14  7:00 AM  Result Value Ref Range Status   Specimen Description BLOOD RIGHT ANTECUBITAL  Final   Special Requests BAA 10CC  Final   Culture PENDING  Incomplete   Report Status PENDING  Incomplete  Culture, blood (routine x 2)     Status: None (Preliminary result)   Collection Time: 08/25/14  7:29 AM  Result Value Ref Range Status   Specimen Description BLOOD RIGHT ASSIST CONTROL  Final   Special Requests BOTTLES DRAWN AEROBIC AND ANAEROBIC 5CC  Final   Culture PENDING  Incomplete   Report Status PENDING  Incomplete    Thank you Anette Guarneri, PharmD [phone]

## 2014-08-26 NOTE — Progress Notes (Signed)
[facility] PROGRESS NOTE  Johnathan Strong JJK:[id number] DOB: 1924-01-19 DOA: 08/25/2014 PCP: Redge Gainer, MD  Assessment/Plan: 1. Probable healthcare associated pneumonia -Patient recently hospitalized discharged on 08/19/2014 in which time was treated for community acquired pneumonia, now presenting with a probable parapneumonic effusion. He underwent ultrasound-guided thoracentesis today that was performed by interventional radiology where a liter of yellow colored fluid was removed, cell count showing white count of 10,299. There were no organisms seen on Gram stain.  -I discussed case with Dr.McQuiad of pulmonary critical care medicine regarding treatment options. It is possible this may be treated with chest tube placement vs more aggressive interventions such as VATS.  -Overweight further recommendations from pulmonary critical care medicine -Continue broad-spectrum IV and microbial therapy with vancomycin and ceftazidime -Follow-up on blood and poor fluid cultures  2.  Acute hypoxemic respiratory failure -Evidenced by a respiratory rate of 33 with patient having an oxygen requirement of 4 L of supplemental oxygen via nasal cannula -Likely secondary to development of healthcare associated pneumonia -Patient undergoing thoracentesis today  3.  Sepsis -Present on admission, evidenced by a white count of 24,200, respiratory rate of 33, heart rate of 107, with source of infection likely healthcare associated pneumonia -Follow-up on blood cultures and pleural fluid cultures -Continue broad-spectrum empiric IV antibiotic therapy with vancomycin and ceftaz   4.  History of paroxysmal atrial fibrillation -Patient's previous hospitalization was complicated by A. fib with RVR. -Currently sinus rhythm, will continue metoprolol 25 mg by mouth twice a day and Cardizem 180 mg by mouth daily  5.  Hypertension -Continue Cozaar 50 mg by mouth daily, diltiazem 180 mg by mouth daily and  metoprolol 25 mg by mouth twice a day. Will continue monitoring blood pressures  6.  Chronic obstructive pulmonary disease -Currently stable  7.  Coronary artery disease -Status post percutaneous intervention to LAD on 09/26/2012, Plavix held  Code Status: DO NOT RESUSCITATE Family Communication: I spoke with his wife and daughter who were present at bedside Disposition Plan: Anticipate patient will be discharged home when medically stable   Consultants:  Pulmonary critical care medicine  Procedures:  Ultrasound-guided thoracentesis performed on 08/26/2014  Antibiotics:  Vancomycin  Ceftaz   HPI/Subjective: Patient is a pleasant 81 year old gentleman with a past medical history of CML, recently discharged from the medicine service on 08/19/2014, admitted on 08/09/2014 during which he was treated for community-acquired pneumonia. Patient showed gradual improvement as he was weaned off of supplemental oxygen, tolerating by mouth intake, and was discharged on Ceftin. His wife reporting that patient did well the week after discharge, got back to his normal routine, then had a decline this past Monday, developing fevers, chills, night sweats. Chest x-ray performed on admission revealed moderate right pleural effusion with associated right lung base consolidation/collapse. He was started on broad-spectrum IV antibiotic therapy with vancomycin and ceftazidime, undergoing ultrasound-guided thoracentesis by interventional radiology on 08/26/2014.  Objective: Filed Vitals:   08/26/14 1422  BP: 127/50  Pulse: 75  Temp: 98.2 F (36.8 C)  Resp: 16    Intake/Output Summary (Last 24 hours) at 08/26/14 1649 Last data filed at 08/26/14 1426  Gross per 24 hour  Intake    240 ml  Output    100 ml  Net    140 ml   Filed Weights   08/25/14 0644  Weight: 74.844 kg (165 lb)    Exam:   General:  Patient is awake alert, appears short of breath at rest, ill-appearing  Cardiovascular:  Regular rate rhythm normal S1-S2 no murmurs rubs gallops  Respiratory: Patient having near absent breath sounds to his right base, there is dullness percussion to right lower lung, left lung clear to auscultation  Abdomen: Soft nontender nondistended  Musculoskeletal: No edema  Data Reviewed: Basic Metabolic Panel:  Recent Labs Lab 08/25/14 0700 08/26/14 0448  NA 137 136  K 4.2 5.1  CL 103 99*  CO2 26 28  GLUCOSE 172* 140*  BUN 14 16  CREATININE 0.97 1.13  CALCIUM 8.8* 8.7*   Liver Function Tests:  Recent Labs Lab 08/25/14 0700 08/26/14 0448  AST 19 13*  ALT 18 14*  ALKPHOS 72 66  BILITOT 1.5* 1.4*  PROT 6.3* 5.8*  ALBUMIN 3.8 3.2*   No results for input(s): LIPASE, AMYLASE in the last 168 hours. No results for input(s): AMMONIA in the last 168 hours. CBC:  Recent Labs Lab 08/25/14 0700 08/26/14 0448  WBC 24.2* 34.0*  NEUTROABS 17.6*  --   HGB 12.1* 11.8*  HCT 37.1* 35.7*  MCV 92.3 92.2  PLT 255 213   Cardiac Enzymes: No results for input(s): CKTOTAL, CKMB, CKMBINDEX, TROPONINI in the last 168 hours. BNP (last 3 results)  Recent Labs  04/21/14 1541 08/25/14 0700  BNP 484.0* 224.5*    ProBNP (last 3 results) No results for input(s): PROBNP in the last 8760 hours.  CBG: No results for input(s): GLUCAP in the last 168 hours.  Recent Results (from the past 240 hour(s))  Culture, blood (routine x 2)     Status: None (Preliminary result)   Collection Time: 08/25/14  7:00 AM  Result Value Ref Range Status   Specimen Description BLOOD RIGHT ANTECUBITAL  Final   Special Requests BAA 10CC  Final   Culture NO GROWTH 1 DAY  Final   Report Status PENDING  Incomplete  Culture, blood (routine x 2)     Status: None (Preliminary result)   Collection Time: 08/25/14  7:29 AM  Result Value Ref Range Status   Specimen Description BLOOD RIGHT ASSIST CONTROL  Final   Special Requests BOTTLES DRAWN AEROBIC AND ANAEROBIC 5CC  Final   Culture NO GROWTH 1 DAY   Final   Report Status PENDING  Incomplete  Gram stain     Status: None   Collection Time: 08/26/14 11:09 AM  Result Value Ref Range Status   Specimen Description FLUID RIGHT PLEURAL  Final   Special Requests NONE  Final   Gram Stain   Final    ABUNDANT WBC PRESENT, PREDOMINANTLY PMN NO ORGANISMS SEEN    Report Status 08/26/2014 FINAL  Final     Studies: Dg Chest 1 View  08/26/2014   CLINICAL DATA:  Post right thoracentesis, pleural effusion.  EXAM: CHEST  1 VIEW  COMPARISON:  08/25/2014  FINDINGS: Slight decreased size of the right effusion which remains moderate. No visible pneumothorax. Continued right lower lobe atelectasis or infiltrate. Left lung is clear. Heart is normal size. No acute bony abnormality.  IMPRESSION: No pneumothorax following thoracentesis. Continued moderate right effusion with right lower lobe atelectasis or infiltrate.   Electronically Signed   By: Rolm Baptise M.D.   On: 08/26/2014 11:42   Dg Chest 2 View  08/25/2014   CLINICAL DATA:  81 year old male with shortness of breath and chest pain since last night. Pain radiating to the right shoulder with chills. Initial encounter. Recent diagnosis of pneumonia.  EXAM: CHEST  2 VIEW  COMPARISON:  08/13/2014 and earlier.  FINDINGS: Seated upright  AP and lateral views of the chest. Continued moderate size right pleural effusion with dense right lung base opacification. Lower lung volumes. No pneumothorax or pulmonary edema. Stable cardiac size and mediastinal contours. The left lung remains clear aside from evidence of a chronic lower lobe pulmonary nodule which has not significantly changed since 2013. Calcified atherosclerosis of the aorta again noted. No acute osseous abnormality identified.  IMPRESSION: 1. Continued moderate right pleural effusion with associated right lung base collapse/consolidation. 2. No new cardiopulmonary abnormality. Chronic left lower lobe lung nodule.   Electronically Signed   By: Genevie Ann M.D.   On:  08/25/2014 08:17   Ct Chest W Contrast  08/25/2014   CLINICAL DATA:  Pleurisy and increase shortness of breath  EXAM: CT CHEST WITH CONTRAST  TECHNIQUE: Multidetector CT imaging of the chest was performed during intravenous contrast administration.  CONTRAST:  64mL OMNIPAQUE IOHEXOL 300 MG/ML  SOLN  COMPARISON:  None.  FINDINGS: THORACIC INLET/BODY WALL:  No acute abnormality.  MEDIASTINUM:  Normal heart size. No pericardial effusion. Extensive coronary atherosclerosis. No acute vascular abnormality. No adenopathy.  LUNG WINDOWS:  Large right pleural effusion which is water density. The fluid is predominantly free-flowing based on comparison to previous chest x-ray, but there is some complex features with scalloping of the lung margins. The right lower lobe and lateral segment right middle lobe are collapsed. There is no evidence of pleural nodularity or lung mass. No pneumonia is suspected.  Calcified granuloma in the left lower lobe.  UPPER ABDOMEN:  Partly visualized left renal cyst, imaged portions stable from 09/29/2008 abdominal CT  OSSEOUS:  No acute fracture.  No suspicious lytic or blastic lesions.  IMPRESSION: Large right pleural effusion with lower lobe and middle lobe segmental collapse. The effusion is predominantly free-flowing based on prior chest x-ray, but there may be partial loculation where the lung is scalloped . No pleural or pulmonary cause is identified.   Electronically Signed   By: Monte Fantasia M.D.   On: 08/25/2014 15:09   US Thoracentesis Asp Pleural Space W/img Guide  08/26/2014   INDICATION: Symptomatic R sided pleural effusion  EXAM: US THORACENTESIS ASP PLEURAL SPACE W/IMG GUIDE  COMPARISON:  None.  MEDICATIONS: 10 cc 1% lidocaine  COMPLICATIONS: None immediate  TECHNIQUE: Informed written consent was obtained from the patient after a discussion of the risks, benefits and alternatives to treatment. A timeout was performed prior to the initiation of the procedure.  Initial  ultrasound scanning demonstrates a R pleural effusion. The lower chest was prepped and draped in the usual sterile fashion. 1% lidocaine was used for local anesthesia.  Under direct ultrasound guidance, a 19 gauge, 7-cm, Yueh catheter was introduced. An ultrasound image was saved for documentation purposes. The thoracentesis was performed. The catheter was removed and a dressing was applied. The patient tolerated the procedure well without immediate post procedural complication. The patient was escorted to have an upright chest radiograph.  FINDINGS: A total of approximately 1 liters of bloody fluid was removed. Requested samples were sent to the laboratory.  IMPRESSION: Successful ultrasound-guided R sided thoracentesis yielding 1 liters of pleural fluid.  Read by:; Lavonia Drafts [facility]   Electronically Signed   By: Corrie Mckusick D.O.   On: 08/26/2014 15:53    Scheduled Meds: . atorvastatin  10 mg Oral q1800  . budesonide-formoterol  2 puff Inhalation BID  . cefTAZidime (FORTAZ)  IV  2 g Intravenous Q12H  . diltiazem  180 mg Oral Daily  .  lidocaine (PF)      . lidocaine (PF)      . losartan  50 mg Oral Daily  . metoprolol tartrate  25 mg Oral BID  . montelukast  10 mg Oral QHS  . tiotropium  18 mcg Inhalation Daily  . vancomycin  750 mg Intravenous Q12H   Continuous Infusions:   Principal Problem:   HCAP (healthcare-associated pneumonia) Active Problems:   Sepsis   CAD (coronary artery disease)   Atrial fibrillation   Chronic lymphatic leukemia   Pleural effusion on right    Time spent: 35 minutes    Kelvin Cellar  [facility] Pager [phone]. If 7PM-7AM, please contact night-coverage at [email or web address], password [facility] 08/26/2014, 4:49 PM  LOS: 1 day

## 2014-08-26 NOTE — Procedures (Signed)
US guided Rt thora  1 liter bloody fluid Sent for labs per MD  cxr pending

## 2014-08-26 NOTE — Consult Note (Addendum)
PULMONARY/CCM NOTE  Requesting MD/Service: Johnathan Strong Date of admission: 6/21 Date of consult: 6/22 Reason for consultation:  parapneumonic effusion  Pt Profile:  48 M admitted to [facility] 6/04 - 6/09 with CAP. CXR on 6/09 revealed R effusion. He failed to recover as anticipated after discharge and readmitted 6/21 with large R effusion. Thoracentesis performed 6/22 revealed exudative characteristics (LDH 341, protein 3.7, with > 10,000 WBC, 93% neutrophils). Fluid was described as bloody. Pulm medicine asked to assist in further eval and mgmt of parapneumonic effusion     HPI:  As above. His admission symptoms were fatigue, malaise, subj fevers, R pleurodynia and DOE  Past Medical History  Diagnosis Date  . Osteopenia 2006  . Hyperlipidemia   . BPH (benign prostatic hypertrophy)   . Transient global amnesia   . Osteoarthritis   . History of TIA (transient ischemic attack) 1980'S    NO RESIDUAL  . COPD (chronic obstructive pulmonary disease) with emphysema   . Chronic back pain   . Itching SECONDARY TO CLL-- CONTROLLED W/ SINGULAIR  . Frequency   . Nocturia   . Hypertension CARDIOLOGIST- DR BRACKBILL-- LAST VISIT 12-15-2010 NOTE IN EPIC  . CLL (chronic lymphocytic leukemia) LAST PLT COUNT APRL 2012  168    Managed at [place]  . Bladder cancer   . Aspirin allergy     Eyes swell  . Asthma   . Dyspnea on exertion   . CAD (coronary artery disease)     a. 08/2012 Cath: LM nl, LAD 90p, LCX 20-30, RCA 100ost fills via L->R collats, EF 55-65%;  b. 09/2012 PCI of prox LAD with 3.0x16 Promus DES.    MEDICATIONS: reviewed   History   Social History  . Marital Status: Married    Spouse Name: N/A  . Number of Children: N/A  . Years of Education: N/A   Occupational History  . retired Scientist, product/process development    Social History Main Topics  . Smoking status: Former Smoker -- 1.00 packs/day for 25 years    Types: Cigarettes    Quit date: 03/06/1960  . Smokeless  tobacco: Former Systems developer    Quit date: 10/25/1978  . Alcohol Use: 0.0 oz/week    0 Standard drinks or equivalent per week     Comment: 09/26/2012 "glass of wine or mixed drink once/month"  . Drug Use: No  . Sexual Activity: No   Other Topics Concern  . Not on file   Social History Narrative    Family History  Problem Relation Age of Onset  . Emphysema Father   . Heart disease Father   . COPD Father   . Atrial fibrillation Father   . Kidney cancer Mother   . Kidney failure Mother     ROS - As per HPI. No hemoptysis, no LE edema  Filed Vitals:   08/26/14 0836 08/26/14 1056 08/26/14 1106 08/26/14 1422  BP:  172/41 176/60 127/50  Pulse:    75  Temp:    98.2 F (36.8 C)  TempSrc:    Oral  Resp:    16  Height:      Weight:      SpO2: 96%   95%    EXAM:  Gen: WDWN, breathless with conversation HEENT: WNL Neck: no LAN Lungs: dull to perc with bronchial BS approx 1/2 way up Cardiovascular: IRIR, no M noted Abdomen: soft, NT, +BS Ext: no C/C/E Neuro: no focal deficits, strength intact  DATA:  BMET  Component Value Date/Time   NA 136 08/26/2014 0448   NA 144 04/03/2014 1023   K 5.1 08/26/2014 0448   CL 99* 08/26/2014 0448   CO2 28 08/26/2014 0448   GLUCOSE 140* 08/26/2014 0448   GLUCOSE 94 04/03/2014 1023   BUN 16 08/26/2014 0448   BUN 16 04/03/2014 1023   CREATININE 1.13 08/26/2014 0448   CALCIUM 8.7* 08/26/2014 0448   GFRNONAA 56* 08/26/2014 0448   GFRAA >60 08/26/2014 0448    CBC    Component Value Date/Time   WBC 34.0* 08/26/2014 0448   WBC 9.5 04/03/2014 1038   RBC 3.87* 08/26/2014 0448   RBC 4.2* 04/03/2014 1038   HGB 11.8* 08/26/2014 0448   HGB 12.0* 04/03/2014 1038   HCT 35.7* 08/26/2014 0448   HCT 40.0* 04/03/2014 1038   PLT 213 08/26/2014 0448   MCV 92.2 08/26/2014 0448   MCV 95.4 04/03/2014 1038   MCH 30.5 08/26/2014 0448   MCH 28.6 04/03/2014 1038   MCHC 33.1 08/26/2014 0448   MCHC 30.0* 04/03/2014 1038   RDW 22.0* 08/26/2014 0448    LYMPHSABS 5.6* 08/25/2014 0700   MONOABS 1.0 08/25/2014 0700   EOSABS 0.0 08/25/2014 0700   BASOSABS 0.0 08/25/2014 0700    CXR: Large R pleural effusion persists after 1 liter thoracentesis  IMPRESSION:   HCAP (healthcare-associated pneumonia) Paroxysmal Atrial fibrillation Chronic lymphatic leukemia R exudative effusion - likely parapneumonic Marked leukocytosis - might be in part due to CLL DNR noted  PLAN:  Discussed in detail with pt as his family. I think VATS might be excessive intervention and difficult for him to recover from given his advanced age but the fluid needs to be more thoroughly drained. Will proceed with [facility] cath placement first thing in AM Consent requested and materials ordered to bedside. Agree with current abx. I note that cytology has already been ordered on thoracentesis specimen  Merton Border, MD ; [facility] service Mobile [phone].  After 5:30 PM or weekends, call [phone]

## 2014-08-27 ENCOUNTER — Inpatient Hospital Stay (HOSPITAL_COMMUNITY): Payer: Medicare Other

## 2014-08-27 ENCOUNTER — Other Ambulatory Visit: Payer: Self-pay

## 2014-08-27 DIAGNOSIS — J90 Pleural effusion, not elsewhere classified: Secondary | ICD-10-CM | POA: Diagnosis present

## 2014-08-27 LAB — BASIC METABOLIC PANEL
Anion gap: 9 (ref 5–15)
BUN: 24 mg/dL — AB (ref 6–20)
CO2: 26 mmol/L (ref 22–32)
CREATININE: 1.06 mg/dL (ref 0.61–1.24)
Calcium: 8.6 mg/dL — ABNORMAL LOW (ref 8.9–10.3)
Chloride: 100 mmol/L — ABNORMAL LOW (ref 101–111)
GFR calc non Af Amer: >60 mL/min (ref >60)
Glucose, Bld: 126 mg/dL — ABNORMAL HIGH (ref 65–99)
Potassium: 4.3 mmol/L (ref 3.5–5.1)
Sodium: 135 mmol/L (ref 135–145)

## 2014-08-27 LAB — PH, BODY FLUID: PH, FLUID: 8.5

## 2014-08-27 LAB — CBC
HCT: 34.3 % — ABNORMAL LOW (ref 39.0–52.0)
Hemoglobin: 11.4 g/dL — ABNORMAL LOW (ref 13.0–17.0)
MCH: 30.3 pg (ref 26.0–34.0)
MCHC: 33.2 g/dL (ref 30.0–36.0)
MCV: 91.2 fL (ref 78.0–100.0)
Platelets: 204 10*3/uL (ref 150–400)
RBC: 3.76 MIL/uL — ABNORMAL LOW (ref 4.22–5.81)
RDW: 21.8 % — AB (ref 11.5–15.5)
WBC: 24.7 10*3/uL — AB (ref 4.0–10.5)

## 2014-08-27 MED ORDER — LIDOCAINE HCL 2 % IJ SOLN
20.0000 mL | Freq: Once | INTRAMUSCULAR | Status: AC
  Administered 2014-08-27: 400 mg via INTRADERMAL
  Filled 2014-08-27: qty 20

## 2014-08-27 MED ORDER — DILTIAZEM HCL ER COATED BEADS 240 MG PO CP24
240.0000 mg | ORAL_CAPSULE | Freq: Every day | ORAL | Status: DC
  Administered 2014-08-28 – 2014-09-01 (×5): 240 mg via ORAL
  Filled 2014-08-27 (×5): qty 1

## 2014-08-27 MED ORDER — FENTANYL CITRATE (PF) 100 MCG/2ML IJ SOLN: 25.0000 ug | INTRAMUSCULAR | Status: DC | PRN

## 2014-08-27 MED ORDER — FENTANYL CITRATE (PF) 100 MCG/2ML IJ SOLN
INTRAMUSCULAR | Status: AC
  Administered 2014-08-27: 25 ug
  Filled 2014-08-27: qty 2

## 2014-08-27 MED ORDER — METOPROLOL TARTRATE 25 MG PO TABS
25.0000 mg | ORAL_TABLET | Freq: Three times a day (TID) | ORAL | Status: DC
  Administered 2014-08-27 – 2014-09-03 (×23): 25 mg via ORAL
  Filled 2014-08-27 (×3): qty 1
  Filled 2014-08-27: qty 2
  Filled 2014-08-27 (×19): qty 1

## 2014-08-27 MED ORDER — METOPROLOL TARTRATE 1 MG/ML IV SOLN
2.5000 mg | Freq: Once | INTRAVENOUS | Status: AC
  Administered 2014-08-27: 2.5 mg via INTRAVENOUS
  Filled 2014-08-27: qty 5

## 2014-08-27 MED ORDER — FENTANYL CITRATE (PF) 100 MCG/2ML IJ SOLN
25.0000 ug | Freq: Once | INTRAMUSCULAR | Status: AC
  Administered 2014-08-27: 25 ug via INTRAVENOUS
  Filled 2014-08-27: qty 2

## 2014-08-27 MED ORDER — CALCIUM CARBONATE ANTACID 500 MG PO CHEW
1.0000 | CHEWABLE_TABLET | Freq: Two times a day (BID) | ORAL | Status: DC
  Administered 2014-08-27 – 2014-09-12 (×19): 200 mg via ORAL
  Filled 2014-08-27 (×32): qty 1

## 2014-08-27 MED ORDER — MIDAZOLAM HCL 2 MG/2ML IJ SOLN
INTRAMUSCULAR | Status: AC
  Filled 2014-08-27: qty 2

## 2014-08-27 NOTE — Progress Notes (Signed)
During bedside reporting patient verbalized not feeling well and experiencing SOB. His hear trate was elevated. Per cardiac monitoring he was in A-Fib with RVR. He was placed on telemetry and given his 10am lopressor and Cardizem was given early to assist with heart rate. Rapid response was notified to access. MD Coralyn Pear was also notified. After MD assessment an order was given to transfer to stepdown and begin a Cardizem drip. Prior to receiving a bed assignment the patient's heart rate decreased to the 110's. The Cardizem drip was d/c'd. Continued to monitor patient closely until a bed assignment was issued. Report was called to [facility] on [facility]. Patient was assigned to [id number]. Patient was transferred to the unit without any issues.

## 2014-08-27 NOTE — Care Management Note (Signed)
Case Management Note  Patient Details  Name: KEO SCHIRMER MRN: [id number] Date of Birth: 07-Dec-1923  Subjective/Objective:      Patient from home with spouse, patient transferred to SDU, NCM will cont to follow for dc needs.            Action/Plan:   Expected Discharge Date:                  Expected Discharge Plan:  [place]  In-House Referral:     Discharge planning Services  CM Consult  Post Acute Care Choice:    Choice offered to:     DME Arranged:    DME Agency:     HH Arranged:    [place] Agency:     Status of Service:     Medicare Important Message Given:  N/A - LOS <3 / Initial given by admissions Date Medicare IM Given:    Medicare IM give by:    Date Additional Medicare IM Given:    Additional Medicare Important Message give by:     If discussed at [place] of Stay Meetings, dates discussed:    Additional Comments:  Zenon Mayo, RN 08/27/2014, 1:09 PM

## 2014-08-27 NOTE — Progress Notes (Signed)
At 0508 patient complained he was not feeling well, respirations were 18-26, HR 33-150s, and patient had increased oxygen need (increased nasal cannula from 4L to 5L) oxygen sats 93% on 5L. Dr. Coralyn Pear notified and patient was put on TELE, EKG done. Patient shown to be in afib with RVR. Scheduled cardizem to be given. Rapid response notified. Will continue to monitor patient.

## 2014-08-27 NOTE — Progress Notes (Addendum)
[facility] PROGRESS NOTE  Johnathan Strong AYT:[id number] DOB: October 15, 1923 DOA: 08/25/2014 PCP: Redge Gainer, MD  Assessment/Plan: 1. Probable healthcare associated pneumonia -Patient recently hospitalized discharged on 08/19/2014 in which time was treated for community acquired pneumonia, now presenting with a probable parapneumonic effusion. He underwent ultrasound-guided thoracentesis today that was performed by interventional radiology where a liter of yellow colored fluid was removed, cell count showing white count of 10,299. There were no organisms seen on Gram stain.  -Pulmonary critical care medicine was consulted. Treatment options were discussed with patient and family members including placement of drain catheter versus repeat thoracentesis. Ultimately a VATS procedure may not be well tolerated by him given multiple comorbidities, advanced age along with issues with A. fib with RVR. -Continue broad-spectrum IV and microbial therapy with vancomycin and ceftazidime  2.  Acute hypoxemic respiratory failure -Evidenced by a respiratory rate of 33 with patient having an oxygen requirement of 4 L of supplemental oxygen via nasal cannula -Likely secondary to development of healthcare associated pneumonia -Patient having increased work of breathing this morning, which in part I think may have been caused by A. fib with RVR  3.  Atrial fibrillation with rapid ventricular response -This morning he was found to be in A. fib having ventricular rates in the 120s to 140s -On his previous hospitalization he had issues with A. fib with RVR. I suspect underlying infectious pulmonary process and acute hypoxemic respiratory failure likely precipitating Afib w RVR -He was given 5 mg of IV Lopressor along with increasing metoprolol to 25 mg by mouth 3 times a day from twice a day dosing -Will increase Cardizem to 240 mg by mouth daily and discontinue Cozaar  4.  Sepsis -Present on admission,  evidenced by a white count of 24,200, respiratory rate of 33, heart rate of 107, with source of infection likely healthcare associated pneumonia -Follow-up on blood cultures and pleural fluid cultures -Continue broad-spectrum empiric IV antibiotic therapy with vancomycin and ceftaz   5.  Hypertension -Because of A. fib with RVR I have increase Cardizem to 240 mg by mouth daily from 120 mg and metoprolol to 25 mg by mouth 3 times a day from BID dosing. With these changes in nodal agents, Cozaar was stopped for now, will monitor heart rates and blood pressures.  6.  Chronic obstructive pulmonary disease -Currently stable  7.  Coronary artery disease -Status post percutaneous intervention to LAD on 09/26/2012, Plavix held  Code Status: DO NOT RESUSCITATE Family Communication: I spoke with his wife and daughter who were present at bedside Disposition Plan: Anticipate patient will be discharged home when medically stable   Consultants:  Pulmonary critical care medicine  Procedures:  Ultrasound-guided thoracentesis performed on 08/26/2014  Antibiotics:  Vancomycin  Ceftaz   HPI/Subjective: Patient is a pleasant 81 year old gentleman with a past medical history of CML, recently discharged from the medicine service on 08/19/2014, admitted on 08/09/2014 during which he was treated for community-acquired pneumonia. Patient showed gradual improvement as he was weaned off of supplemental oxygen, tolerating by mouth intake, and was discharged on Ceftin. His wife reporting that patient did well the week after discharge, got back to his normal routine, then had a decline this past Monday, developing fevers, chills, night sweats. Chest x-ray performed on admission revealed moderate right pleural effusion with associated right lung base consolidation/collapse. He was started on broad-spectrum IV antibiotic therapy with vancomycin and ceftazidime, undergoing ultrasound-guided thoracentesis by  interventional radiology on 08/26/2014. Pulmonary critical care medicine  was consulted regarding pleural effusion as it was felt he would benefit from drainage of fluid. Hospitalization was complicated by the moment of A. fib with RVR on 08/27/2014 likely precipitated by underlying pulmonary infectious process and acute hypoxemic respiratory failure. He was given a dose of IV Lopressor and his metoprolol was increased to 25 mg by mouth 3 times a day after which she achieved better control of heart rates.  Objective: Filed Vitals:   08/27/14 1622  BP: 144/77  Pulse:   Temp: 98 F (36.7 C)  Resp: 24    Intake/Output Summary (Last 24 hours) at 08/27/14 1649 Last data filed at 08/27/14 0500  Gross per 24 hour  Intake     60 ml  Output    400 ml  Net   -340 ml   Filed Weights   08/25/14 0644  Weight: 74.844 kg (165 lb)    Exam:   General: Ill-appearing, appears uncomfortable, has increased work of breathing  Cardiovascular: Regular rate rhythm normal S1-S2 no murmurs rubs gallops  Respiratory: Patient having near absent breath sounds to his right base, there is dullness percussion to right lower lung, left lung clear to auscultation  Abdomen: Soft nontender nondistended  Musculoskeletal: No edema  Data Reviewed: Basic Metabolic Panel:  Recent Labs Lab 08/25/14 0700 08/26/14 0448 08/27/14 0530  NA 137 136 135  K 4.2 5.1 4.3  CL 103 99* 100*  CO2 26 28 26   GLUCOSE 172* 140* 126*  BUN 14 16 24*  CREATININE 0.97 1.13 1.06  CALCIUM 8.8* 8.7* 8.6*   Liver Function Tests:  Recent Labs Lab 08/25/14 0700 08/26/14 0448  AST 19 13*  ALT 18 14*  ALKPHOS 72 66  BILITOT 1.5* 1.4*  PROT 6.3* 5.8*  ALBUMIN 3.8 3.2*   No results for input(s): LIPASE, AMYLASE in the last 168 hours. No results for input(s): AMMONIA in the last 168 hours. CBC:  Recent Labs Lab 08/25/14 0700 08/26/14 0448 08/27/14 0530  WBC 24.2* 34.0* 24.7*  NEUTROABS 17.6*  --   --   HGB 12.1*  11.8* 11.4*  HCT 37.1* 35.7* 34.3*  MCV 92.3 92.2 91.2  PLT 255 213 204   Cardiac Enzymes: No results for input(s): CKTOTAL, CKMB, CKMBINDEX, TROPONINI in the last 168 hours. BNP (last 3 results)  Recent Labs  04/21/14 1541 08/25/14 0700  BNP 484.0* 224.5*    ProBNP (last 3 results) No results for input(s): PROBNP in the last 8760 hours.  CBG: No results for input(s): GLUCAP in the last 168 hours.  Recent Results (from the past 240 hour(s))  Culture, blood (routine x 2)     Status: None (Preliminary result)   Collection Time: 08/25/14  7:00 AM  Result Value Ref Range Status   Specimen Description BLOOD RIGHT ANTECUBITAL  Final   Special Requests BAA 10CC  Final   Culture NO GROWTH 2 DAYS  Final   Report Status PENDING  Incomplete  Culture, blood (routine x 2)     Status: None (Preliminary result)   Collection Time: 08/25/14  7:29 AM  Result Value Ref Range Status   Specimen Description BLOOD RIGHT ASSIST CONTROL  Final   Special Requests BOTTLES DRAWN AEROBIC AND ANAEROBIC 5CC  Final   Culture NO GROWTH 2 DAYS  Final   Report Status PENDING  Incomplete  Culture, body fluid-bottle     Status: None (Preliminary result)   Collection Time: 08/26/14 11:09 AM  Result Value Ref Range Status   Specimen Description  FLUID RIGHT PLEURAL  Final   Special Requests NONE  Final   Culture NO GROWTH < 24 HOURS  Final   Report Status PENDING  Incomplete  Gram stain     Status: None   Collection Time: 08/26/14 11:09 AM  Result Value Ref Range Status   Specimen Description FLUID RIGHT PLEURAL  Final   Special Requests NONE  Final   Gram Stain   Final    ABUNDANT WBC PRESENT, PREDOMINANTLY PMN NO ORGANISMS SEEN    Report Status 08/26/2014 FINAL  Final     Studies: Dg Chest 1 View  08/26/2014   CLINICAL DATA:  Post right thoracentesis, pleural effusion.  EXAM: CHEST  1 VIEW  COMPARISON:  08/25/2014  FINDINGS: Slight decreased size of the right effusion which remains moderate. No  visible pneumothorax. Continued right lower lobe atelectasis or infiltrate. Left lung is clear. Heart is normal size. No acute bony abnormality.  IMPRESSION: No pneumothorax following thoracentesis. Continued moderate right effusion with right lower lobe atelectasis or infiltrate.   Electronically Signed   By: Rolm Baptise M.D.   On: 08/26/2014 11:42   US Thoracentesis Asp Pleural Space W/img Guide  08/26/2014   INDICATION: Symptomatic R sided pleural effusion  EXAM: US THORACENTESIS ASP PLEURAL SPACE W/IMG GUIDE  COMPARISON:  None.  MEDICATIONS: 10 cc 1% lidocaine  COMPLICATIONS: None immediate  TECHNIQUE: Informed written consent was obtained from the patient after a discussion of the risks, benefits and alternatives to treatment. A timeout was performed prior to the initiation of the procedure.  Initial ultrasound scanning demonstrates a R pleural effusion. The lower chest was prepped and draped in the usual sterile fashion. 1% lidocaine was used for local anesthesia.  Under direct ultrasound guidance, a 19 gauge, 7-cm, Yueh catheter was introduced. An ultrasound image was saved for documentation purposes. The thoracentesis was performed. The catheter was removed and a dressing was applied. The patient tolerated the procedure well without immediate post procedural complication. The patient was escorted to have an upright chest radiograph.  FINDINGS: A total of approximately 1 liters of bloody fluid was removed. Requested samples were sent to the laboratory.  IMPRESSION: Successful ultrasound-guided R sided thoracentesis yielding 1 liters of pleural fluid.  Read by:; Lavonia Drafts [facility]   Electronically Signed   By: Corrie Mckusick D.O.   On: 08/26/2014 15:53    Scheduled Meds: . atorvastatin  10 mg Oral q1800  . budesonide-formoterol  2 puff Inhalation BID  . calcium carbonate  1 tablet Oral BID  . cefTAZidime (FORTAZ)  IV  2 g Intravenous Q12H  . diltiazem  180 mg Oral Daily  . fentaNYL      .  losartan  50 mg Oral Daily  . metoprolol tartrate  25 mg Oral TID  . midazolam      . montelukast  10 mg Oral QHS  . tiotropium  18 mcg Inhalation Daily  . vancomycin  750 mg Intravenous Q12H   Continuous Infusions:   Principal Problem:   HCAP (healthcare-associated pneumonia) Active Problems:   Sepsis   CAD (coronary artery disease)   Atrial fibrillation   Chronic lymphatic leukemia   Pleural effusion on right    Time spent: 35 minutes    Kelvin Cellar  [facility] Pager [phone]. If 7PM-7AM, please contact night-coverage at [email or web address], password [email or web address] 08/27/2014, 4:49 PM  LOS: 2 days

## 2014-08-27 NOTE — Consult Note (Signed)
[facility] CM Inpatient Consult   08/27/2014  Johnathan Strong 07/24/1923 [id number] Referral received.  Met with the patient and his family at the bedside to introduce them to [facility] Care Management services.  Explained the benefits of care management in disease and care management needs.  Patient and family wanted to review information.  Brochure and contact information provided.  Updated inpatient RNCM about referral and potential follow up prior to discharge for needs.  Of note, [facility] Care Management does not replace or interfere with any services needed.  Please contact: Victoria Brewer, RN BSN CCM [facility] Liaison  [phone] business mobile phone  

## 2014-08-27 NOTE — Procedures (Signed)
Chest Tube Insertion Procedure Note  Indications:  Clinically significant Empyema  Pre-operative Diagnosis: Pleural effusion of the RIGHT lung  Post-operative Diagnosis: Likely Empyema of the RIGHT lung  Procedure Details  Informed consent was obtained for the procedure, including sedation.  Risks of lung perforation, hemorrhage, arrhythmia, and adverse drug reaction were discussed.   After chlorhexidine skin prep, using standard technique, a 32 French tube was placed in the right lateral 5-6 rib space.  Findings: Fibrous material on [place] prompted change to large bore 32Fr chest tube thoracotomy. Approximately 577ml of pus drained from tube and overflow from chest tube insertion  Estimated Blood Loss:  Minimal         Specimens: none              Complications:  None; patient tolerated the procedure well         Disposition: Floor status remains unchanged         Condition: stable  Attending Attestation:   perfromed with pete  [place] prior, concern fibrinous material Placed 32 french Excellent anaesthesia to skin , soft tissue and rib peri ostomy Dark fluid out, intermittent thicker material To suction Tolerated well No leak  Lavon Paganini. Titus Mould, MD, [place] Pgr: [place] [facility]

## 2014-08-27 NOTE — Progress Notes (Signed)
Called to assist with chest tube insertion. Time out and consent signed prior to start of procedure.  25 mcg IV Fentanyl given at start of procedure at 1700 VSS throughout procedure.  Patient tolerated procedure well.

## 2014-08-27 NOTE — Significant Event (Signed)
Rapid Response Event Note  Overview:   Time Called: 0745 Arrival Time: 0748 Event Type: Cardiac  Initial Focused Assessment:  Called by RN to evaluate patient with HR 60-160   .  Upon my arrival to patients room, RN and family at bedside. Patient sitting in bed with nasal cannula on.  EKG being performed on my arrival.  Patient with increased WOB, RR 24, 156, 159/80, sats 94%.  Patient denies pain, however does moan occasionally,  Relates does feel SOB.    Interventions:  MD has been notified, with orders received by primary RN.  Recommend giving am meds now.     Event Summary:  No RRT intervention, RN to call if assistance needed   at      at          [facility], Harlin Rain

## 2014-08-27 NOTE — Progress Notes (Signed)
PULMONARY/CCM NOTE  Requesting MD/Service: August Saucer Date of admission: 6/21 Date of consult: 6/22 Reason for consultation:  parapneumonic effusion  Pt Profile:  70 M admitted to [facility] 6/04 - 6/09 with CAP. CXR on 6/09 revealed R effusion. He failed to recover as anticipated after discharge and readmitted 6/21 with large R effusion. Thoracentesis performed 6/22 revealed exudative characteristics (LDH 341, protein 3.7, with > 10,000 WBC, 93% neutrophils). Fluid was described as bloody. Pulm medicine asked to assist in further eval and mgmt of parapneumonic effusion     HPI: Denies chest pain or palpitations Complains of dyspnea Found to be in A. fib RVR this morning being transferred to stepdown unit  Filed Vitals:   08/27/14 0836 08/27/14 0837 08/27/14 1021 08/27/14 1102  BP: 159/80   123/57  Pulse:  133  132  Temp:    98.1 F (36.7 C)  TempSrc:    Axillary  Resp:      Height:      Weight:      SpO2:   97% 95%    EXAM:  Gen: WDWN, breathless with conversation HEENT: WNL Neck: no LAN Lungs: dull to perc with bronchial BS approx 1/2 way up Cardiovascular: IRIR, no M noted Abdomen: soft, NT, +BS Ext: no C/C/E Neuro: no focal deficits, strength intact  DATA:  BMET    Component Value Date/Time   NA 135 08/27/2014 0530   NA 144 04/03/2014 1023   K 4.3 08/27/2014 0530   CL 100* 08/27/2014 0530   CO2 26 08/27/2014 0530   GLUCOSE 126* 08/27/2014 0530   GLUCOSE 94 04/03/2014 1023   BUN 24* 08/27/2014 0530   BUN 16 04/03/2014 1023   CREATININE 1.06 08/27/2014 0530   CALCIUM 8.6* 08/27/2014 0530   GFRNONAA >60 08/27/2014 0530   GFRAA >60 08/27/2014 0530    CBC    Component Value Date/Time   WBC 24.7* 08/27/2014 0530   WBC 9.5 04/03/2014 1038   RBC 3.76* 08/27/2014 0530   RBC 4.2* 04/03/2014 1038   HGB 11.4* 08/27/2014 0530   HGB 12.0* 04/03/2014 1038   HCT 34.3* 08/27/2014 0530   HCT 40.0* 04/03/2014 1038   PLT 204 08/27/2014 0530   MCV 91.2 08/27/2014 0530    MCV 95.4 04/03/2014 1038   MCH 30.3 08/27/2014 0530   MCH 28.6 04/03/2014 1038   MCHC 33.2 08/27/2014 0530   MCHC 30.0* 04/03/2014 1038   RDW 21.8* 08/27/2014 0530   LYMPHSABS 5.6* 08/25/2014 0700   MONOABS 1.0 08/25/2014 0700   EOSABS 0.0 08/25/2014 0700   BASOSABS 0.0 08/25/2014 0700    CXR: Large R pleural effusion persists after 1 liter thoracentesis  IMPRESSION:   HCAP (healthcare-associated pneumonia) Paroxysmal Atrial fibrillation Chronic lymphatic leukemia R exudative effusion - likely parapneumonic Marked leukocytosis - baseline 11k DNR noted  PLAN:  Discussed in detail with pt as his family. I think VATS might be excessive intervention and difficult for him to recover from given his advanced age  Fluid needs to be more thoroughly drained either with repeat thoracenteses or tube thoracostomy.  He is pretty hesitant to have this procedure today, all concerns addressed with family -we will try to control his heart rate better with Cardizem drip in stepdown , then assess with bedside ultrasound of enough fluid to tap .  pleural fluid culture so far negative-which is reassuring, continue antibiotic  Kara Mead MD. FCCP. [place] [facility] Pager [phone] If no response call [phone]

## 2014-08-27 NOTE — Procedures (Signed)
Ultrasound of right chest revealed marked fibrinous tissue and concern for loculation t/o the right chest wall. Because of this we opted to go ahead w/ Chest tube over wayne cath.  Erick Colace ACNP-BC [place] Pager # [phone] OR # [phone] if no answer  .perfromed and performed with Pete  1. Moderate effusion with fibrinous material throughout chest effusion rt  2. Moderate atx throughout with lung flap  Lavon Paganini. Titus Mould, MD, [place] Pgr: [place]

## 2014-08-28 ENCOUNTER — Inpatient Hospital Stay (HOSPITAL_COMMUNITY): Payer: Medicare Other

## 2014-08-28 DIAGNOSIS — J90 Pleural effusion, not elsewhere classified: Secondary | ICD-10-CM

## 2014-08-28 DIAGNOSIS — IMO0001 Reserved for inherently not codable concepts without codable children: Secondary | ICD-10-CM | POA: Diagnosis present

## 2014-08-28 LAB — BASIC METABOLIC PANEL
Anion gap: 9 (ref 5–15)
BUN: 23 mg/dL — ABNORMAL HIGH (ref 6–20)
CALCIUM: 8.5 mg/dL — AB (ref 8.9–10.3)
CO2: 27 mmol/L (ref 22–32)
Chloride: 97 mmol/L — ABNORMAL LOW (ref 101–111)
Creatinine, Ser: 1.12 mg/dL (ref 0.61–1.24)
GFR calc Af Amer: >60 mL/min (ref >60)
GFR, EST NON AFRICAN AMERICAN: 56 mL/min — AB (ref >60)
GLUCOSE: 118 mg/dL — AB (ref 65–99)
Potassium: 4 mmol/L (ref 3.5–5.1)
Sodium: 133 mmol/L — ABNORMAL LOW (ref 135–145)

## 2014-08-28 LAB — CBC
HCT: 36 % — ABNORMAL LOW (ref 39.0–52.0)
HEMOGLOBIN: 12 g/dL — AB (ref 13.0–17.0)
MCH: 30.2 pg (ref 26.0–34.0)
MCHC: 33.3 g/dL (ref 30.0–36.0)
MCV: 90.5 fL (ref 78.0–100.0)
Platelets: 243 10*3/uL (ref 150–400)
RBC: 3.98 MIL/uL — ABNORMAL LOW (ref 4.22–5.81)
RDW: 21.8 % — AB (ref 11.5–15.5)
WBC: 23.1 10*3/uL — ABNORMAL HIGH (ref 4.0–10.5)

## 2014-08-28 LAB — VANCOMYCIN, TROUGH: Vancomycin Tr: 21 ug/mL — ABNORMAL HIGH (ref 10.0–20.0)

## 2014-08-28 MED ORDER — VANCOMYCIN HCL 500 MG IV SOLR
500.0000 mg | Freq: Two times a day (BID) | INTRAVENOUS | Status: DC
  Administered 2014-08-29 – 2014-09-01 (×7): 500 mg via INTRAVENOUS
  Filled 2014-08-28 (×8): qty 500

## 2014-08-28 MED ORDER — POLYETHYLENE GLYCOL 3350 17 G PO PACK
17.0000 g | PACK | Freq: Every day | ORAL | Status: DC
  Administered 2014-08-28 – 2014-09-05 (×6): 17 g via ORAL
  Filled 2014-08-28 (×10): qty 1

## 2014-08-28 MED ORDER — DOCUSATE SODIUM 100 MG PO CAPS
100.0000 mg | ORAL_CAPSULE | Freq: Two times a day (BID) | ORAL | Status: DC
  Administered 2014-08-28 – 2014-08-29 (×4): 100 mg via ORAL
  Filled 2014-08-28 (×5): qty 1

## 2014-08-28 MED ORDER — SODIUM CHLORIDE 0.9 % IJ SOLN
Freq: Once | INTRAVENOUS | Status: AC
  Administered 2014-08-28: 13:00:00 via INTRAPLEURAL
  Filled 2014-08-28: qty 10

## 2014-08-28 NOTE — Progress Notes (Signed)
Chest tube clamp unclamped. Immediate 1300 ml of fluid rushed from chest tube. PT initially a little short of breath and anxious. I turned up O2 and monitored. Also obtained CXR concerned about re-expansion edema. The CXR was w/out edema. Did show remarkable improvement in aeration and decreased effusion. Left the Chest tube off suction. Will go back and re-apply later today. Will check repeat CXR in am. Will need to decide on further TPA. Might be best to repeat non-contrasted CT, alternatively go forward w/ planned doses on Saturday and Sunday.   Erick Colace ACNP-BC [place] Pager # [phone] OR # [phone] if no answer

## 2014-08-28 NOTE — Care Management Note (Addendum)
Case Management Note  Patient Details  Name: Johnathan Strong MRN: [id number] Date of Birth: 11-27-1923  Subjective/Objective: Pt admitted for HCAP. Pt is from home with wife that provides supervision.                    Action/Plan: CM did discuss disposition needs with pt and wife. Wife had concerns that pt will need assistance once he is medically stable for d/c. Pt will benefit from PT/OT consult. Pt was initially set up with [facility] when d/c on last admission and refused services once agency called. CM did provide pt and wife with list of private duty agency list. Pt may need higher level of care at time of d/c.  [facility] may f/u once d/c. CM will continue to monitor.    Expected Discharge Date:                  Expected Discharge Plan:  [place]  In-House Referral:     Discharge planning Services  CM Consult  Post Acute Care Choice:    Choice offered to:     DME Arranged:    DME Agency:     HH Arranged:    [place] Agency:     Status of Service:  In process, will continue to follow  Medicare Important Message Given:  Yes Date Medicare IM Given:  08/28/14 Medicare IM give by:  Jacqlyn Krauss, RN,BSN  Date Additional Medicare IM Given:    Additional Medicare Important Message give by:     If discussed at [place] of Stay Meetings, dates discussed:    Additional Comments:  1455 09-08-14 Jacqlyn Krauss, RN,BSN [phone] CM will continue to monitor for disposition needs. Medications being tweaked-plan for home with [facility] services once stable. No further needs at this time.    1516 09-04-14 Jacqlyn Krauss, RN,BSN [phone] CM did speak with pt's wife again in reference to disposition needs. Pt's wife still unsure of plan of care at this point. CM did offer choice previously and family has list of personal care list. CM will continue to monitor.   Bethena Roys, RN 08/28/2014, 2:42 PM

## 2014-08-28 NOTE — Consult Note (Signed)
Reason for Consult: Right para-pneumonic effusion Referring Physician: Dr. Gae Gallop   Johnathan Strong is an 81 y.o. male.  HPI: Johnathan Strong presented with a cc/o shortness of breath  He is an 81 yo man with a history of CML, CAD, atrial fibrillation, COPD, hypertension, chronic back pain, hyperlipidemia and a recent pneumonia. He was admitted earlier this month with pneumonia. He improved with antibiotics and was discharged on 08/19/2014 after a 10 day stay. He did well for about a week, then started feeling more short of breath and was having night sweats. He was readmitted. HIs CXR showed a moderate right pleural effusion. He was started on vancomycin and ceftazidime. He had an US guided thoracentesis on 6/22. The fluid was c/w an exudate.  A chest tube was placed yesterday. About a liter of fluid drained initially then tapered off. His CXR this AM showed some residual pleural fluid. He was given TPA earlier today. When the tube was reopened he drained 100 ml of bloody fluid immediately. He felt short of breath and light headed. A repeat CXR showed improved aeration in the right base.  He went into rapid atrial fibrillation yesterday and remains in atrial fib today. He currently feels less short of breath.    Past Medical History  Diagnosis Date  . Osteopenia 2006  . Hyperlipidemia   . BPH (benign prostatic hypertrophy)   . Transient global amnesia   . Osteoarthritis   . History of TIA (transient ischemic attack) 1980'S    NO RESIDUAL  . COPD (chronic obstructive pulmonary disease) with emphysema   . Chronic back pain   . Itching SECONDARY TO CLL-- CONTROLLED W/ SINGULAIR  . Frequency   . Nocturia   . Hypertension CARDIOLOGIST- DR BRACKBILL-- LAST VISIT 12-15-2010 NOTE IN EPIC  . CLL (chronic lymphocytic leukemia) LAST PLT COUNT APRL 2012  168    Managed at [facility]  . Bladder cancer   . Aspirin allergy     Eyes swell  . Asthma   . Dyspnea on exertion   . CAD  (coronary artery disease)     a. 08/2012 Cath: LM nl, LAD 90p, LCX 20-30, RCA 100ost fills via L->R collats, EF 55-65%;  b. 09/2012 PCI of prox LAD with 3.0x16 Promus DES.    Past Surgical History  Procedure Laterality Date  . Excisional hemorrhoidectomy  1980  . Cardiovascular stress test  07/2005    a. 07/2005- no reversible ischemia, normal EF b. 06/2009- EF 73%, no reversible ischemia, inferolateral TWIs at rest, upright in recovery c. 08/2012- mediuim-sized partially reversible basal to mid inferior and inferoseptal perfusion defect c/w with prior infarction and peri-infarct ischemia; EF 68% and borderline ST/T changes on stress ECG   . Transurethral resection of bladder tumor  10-19-2008    AND DILATION URETHRAL STRICTURE  . Cataract extraction w/ intraocular lens  implant, bilateral Bilateral ~ 2007  . Transthoracic echocardiogram  01-03-2011    LVEF 65-03%, grade 1 diastolic dysfunction, no WMAs or structural abnormalities  . Cystoscopy with biopsy  10/30/2011    Procedure: CYSTOSCOPY WITH BIOPSY;  Surgeon: Claybon Jabs, MD;  Location: [facility];  Service: Urology;  Laterality: N/A;  . Cardiac catheterization  08/27/2012  . Coronary angioplasty with stent placement  09/26/2012    "1" (09/26/2012)  . Coronary stent placement  09/26/12  . Left heart catheterization with coronary angiogram N/A 08/27/2012    Procedure: LEFT HEART CATHETERIZATION WITH CORONARY ANGIOGRAM;  Surgeon: Peter M Martinique, MD;  Location[facility] CATH LAB;  Service: Cardiovascular;  Laterality: N/A;  . Percutaneous coronary stent intervention (pci-s) N/A 09/26/2012    Procedure: PERCUTANEOUS CORONARY STENT INTERVENTION (PCI-S);  Surgeon: Peter M Martinique, MD;  Location: [facility] CATH LAB;  Service: Cardiovascular;  Laterality: N/A;    Family History  Problem Relation Age of Onset  . Emphysema Father   . Heart disease Father   . COPD Father   . Atrial fibrillation Father   . Kidney cancer Mother   . Kidney  failure Mother     Social History:  reports that he quit smoking about 54 years ago. His smoking use included Cigarettes. He has a 25 pack-year smoking history. He quit smokeless tobacco use about 35 years ago. He reports that he drinks alcohol. He reports that he does not use illicit drugs.  Allergies:  Allergies  Allergen Reactions  . Aspirin Anaphylaxis, Shortness Of Breath and Swelling  . Nsaids Anaphylaxis, Shortness Of Breath and Swelling    Raise BP  . Septra [Sulfamethoxazole-Trimethoprim] Rash  . Albuterol     Can not tolerate high/ frequent doses due to [place] with using HFA PRN  . Benicar [Olmesartan Medoxomil]     dizzy  . Salicylates     SOB  . Ampicillin Rash    Medications:  Scheduled: . alteplase (tPA) 33m in NS 495mfor Dr.Oaks (intrapleural administration/[facility])   Intrapleural Once  . atorvastatin  10 mg Oral q1800  . budesonide-formoterol  2 puff Inhalation BID  . calcium carbonate  1 tablet Oral BID  . cefTAZidime (FORTAZ)  IV  2 g Intravenous Q12H  . diltiazem  240 mg Oral Daily  . docusate sodium  100 mg Oral BID  . metoprolol tartrate  25 mg Oral TID  . montelukast  10 mg Oral QHS  . polyethylene glycol  17 g Oral Daily  . tiotropium  18 mcg Inhalation Daily  . vancomycin  750 mg Intravenous Q12H    Results for orders placed or performed during the hospital encounter of 08/25/14 (from the past 48 hour(s))  Basic metabolic panel     Status: Abnormal   Collection Time: 08/27/14  5:30 AM  Result Value Ref Range   Sodium 135 135 - 145 mmol/L   Potassium 4.3 3.5 - 5.1 mmol/L   Chloride 100 (L) 101 - 111 mmol/L   CO2 26 22 - 32 mmol/L   Glucose, Bld 126 (H) 65 - 99 mg/dL   BUN 24 (H) 6 - 20 mg/dL   Creatinine, Ser 1.06 0.61 - 1.24 mg/dL   Calcium 8.6 (L) 8.9 - 10.3 mg/dL   GFR calc non Af Amer >60 >60 mL/min   GFR calc Af Amer >60 >60 mL/min    Comment: (NOTE) The eGFR has been calculated using the CKD EPI equation. This calculation has not  been validated in all clinical situations. eGFR's persistently <60 mL/min signify possible Chronic Kidney Disease.    Anion gap 9 5 - 15  CBC     Status: Abnormal   Collection Time: 08/27/14  5:30 AM  Result Value Ref Range   WBC 24.7 (H) 4.0 - 10.5 K/uL   RBC 3.76 (L) 4.22 - 5.81 MIL/uL   Hemoglobin 11.4 (L) 13.0 - 17.0 g/dL   HCT 34.3 (L) 39.0 - 52.0 %   MCV 91.2 78.0 - 100.0 fL   MCH 30.3 26.0 - 34.0 pg   MCHC 33.2 30.0 - 36.0  g/dL   RDW 21.8 (H) 11.5 - 15.5 %   Platelets 204 150 - 400 K/uL    Comment: REPEATED TO VERIFY PLATELET COUNT CONFIRMED BY SMEAR   Basic metabolic panel     Status: Abnormal   Collection Time: 08/28/14  4:54 AM  Result Value Ref Range   Sodium 133 (L) 135 - 145 mmol/L   Potassium 4.0 3.5 - 5.1 mmol/L   Chloride 97 (L) 101 - 111 mmol/L   CO2 27 22 - 32 mmol/L   Glucose, Bld 118 (H) 65 - 99 mg/dL   BUN 23 (H) 6 - 20 mg/dL   Creatinine, Ser 1.12 0.61 - 1.24 mg/dL   Calcium 8.5 (L) 8.9 - 10.3 mg/dL   GFR calc non Af Amer 56 (L) >60 mL/min   GFR calc Af Amer >60 >60 mL/min    Comment: (NOTE) The eGFR has been calculated using the CKD EPI equation. This calculation has not been validated in all clinical situations. eGFR's persistently <60 mL/min signify possible Chronic Kidney Disease.    Anion gap 9 5 - 15  CBC     Status: Abnormal   Collection Time: 08/28/14  4:54 AM  Result Value Ref Range   WBC 23.1 (H) 4.0 - 10.5 K/uL   RBC 3.98 (L) 4.22 - 5.81 MIL/uL   Hemoglobin 12.0 (L) 13.0 - 17.0 g/dL   HCT 36.0 (L) 39.0 - 52.0 %   MCV 90.5 78.0 - 100.0 fL   MCH 30.2 26.0 - 34.0 pg   MCHC 33.3 30.0 - 36.0 g/dL   RDW 21.8 (H) 11.5 - 15.5 %   Platelets 243 150 - 400 K/uL    Dg Chest Port 1 View  08/28/2014   CLINICAL DATA:  Chest tube placement for effusion.  EXAM: PORTABLE CHEST - 1 VIEW  COMPARISON:  Study obtained earlier in the day  FINDINGS: There is a chest tube on the right without demonstrable pneumothorax. Right effusion is smaller. There is  a fairly small right effusion with right base consolidation and patchy atelectasis. Lungs elsewhere clear. There is no appreciable interstitial edema. The heart size and pulmonary vascularity are normal. There is atherosclerotic change in the aorta. No adenopathy. No bone lesions.  IMPRESSION: Chest tube in place on the right. No pneumothorax. Note that there is soft tissue air tracking along the chest tube in the lateral right hemithorax inferiorly. Right pleural effusion is smaller compared to earlier in the day. There is a residual right effusion with right base consolidation and patchy atelectasis.  There is no evidence of pulmonary edema. No change in cardiac silhouette.   Electronically Signed   By: Lowella Grip III M.D.   On: 08/28/2014 15:19   Dg Chest Port 1 View  08/28/2014   CLINICAL DATA:  Pleural effusion.  Pulmonary infection.  EXAM: PORTABLE CHEST - 1 VIEW  COMPARISON:  08/27/2014.  FINDINGS: Right chest tube in stable position. Mediastinum and hilar structures are normal. Cardiomegaly with normal pulmonary vascularity. Right lower lobe infiltrate consistent with pneumonia. Right pleural effusion. Right chest wall subcutaneous emphysema again noted.  IMPRESSION: 1.  Right chest tube in stable position.  No pneumothorax.  2. Persistent dense right lower lobe infiltrate and right pleural effusion. No interim change.   Electronically Signed   By: Marcello Moores  Register   On: 08/28/2014 08:15   Dg Chest Port 1 View  08/27/2014   CLINICAL DATA:  Chest tube placement.  EXAM: PORTABLE CHEST - 1 VIEW  COMPARISON:  Single view of the chest 08/26/2014.  FINDINGS: A new right chest tube is in place. Right pleural effusion and basilar airspace disease are again seen. No pneumothorax is identified. The left lung appears clear. Heart size is normal.  IMPRESSION: Status post right chest tube placement. Right pleural effusion basilar airspace disease do not appear notably changed. No pneumothorax is seen.    Electronically Signed   By: Inge Rise M.D.   On: 08/27/2014 18:30    Review of Systems  Constitutional: Positive for malaise/fatigue and diaphoresis. Negative for fever.  Respiratory: Positive for shortness of breath.   Cardiovascular: Positive for palpitations.   Blood pressure 118/60, pulse 92, temperature 98 F (36.7 C), temperature source Oral, resp. rate 24, height '5\' 8"'  (1.727 m), weight 164 lb 14.4 oz (74.798 kg), SpO2 93 %. Physical Exam  Vitals reviewed. Constitutional: He is oriented to person, place, and time. No distress.  Thin  HENT:  Head: Normocephalic and atraumatic.  Eyes: EOM are normal.  Neck: Neck supple. No thyromegaly present.  Cardiovascular:  Irregularly irregular, tachycardic  Respiratory: No respiratory distress. He has no wheezes.  Diminished BS bilaterally R>L  GI: Soft. There is no tenderness.  Lymphadenopathy:    He has no cervical adenopathy.  Neurological: He is alert and oriented to person, place, and time. No cranial nerve deficit.  Skin: Skin is warm and dry.    Assessment/Plan: 81 yo man with multiple medical problems who has a right lower lobe pneumonia complicated by a para-pneumonic effusion.  I agree with Pulmonary's management. He is a poor operative candidate. He had a good response to TPA through the tube. His CXR is now markedly improved.  I am optimistic we will be able to avoid VATS  Melrose Nakayama 08/28/2014, 5:16 PM

## 2014-08-28 NOTE — Progress Notes (Signed)
PULMONARY/CCM NOTE  Requesting MD/Service: August Saucer Date of admission: 6/21 Date of consult: 6/22 Reason for consultation:  parapneumonic effusion  Pt Profile:  74 M admitted to [facility] 6/04 - 6/09 with CAP. CXR on 6/09 revealed R effusion. He failed to recover as anticipated after discharge and readmitted 6/21 with large R effusion. Thoracentesis performed 6/22 revealed exudative characteristics (LDH 341, protein 3.7, with > 10,000 WBC, 93% neutrophils). Fluid was described as bloody. Pulm medicine asked to assist in further eval and mgmt of parapneumonic effusion   Significant tests/ events 6/23  A. fib RVR- transferred to stepdown unit 6/23 [place] of right chest - marked fibrinous tissue and concern for loculation t/o the right chest wall Rt chest tube 6/23 >>   HPI: Denies chest pain or palpitations Improved dyspnea afebrile   Filed Vitals:   08/28/14 0440 08/28/14 0549 08/28/14 0756 08/28/14 1009  BP: 165/84  143/92   Pulse: 54  123   Temp:  98.4 F (36.9 C)    TempSrc:  Oral    Resp: 25  34   Height:      Weight:  164 lb 14.4 oz (74.798 kg)    SpO2: 98%  96% 97%    EXAM:  Gen: WDWN, able to speak in fulls entences HEENT: WNL Neck: no LAN Lungs: dull to perc with bronchial BS approx 1/2 way up on rt Cardiovascular: IRIR, no M noted Abdomen: soft, NT, +BS Ext: no C/C/E Neuro: no focal deficits, strength intact  DATA:  BMET    Component Value Date/Time   NA 133* 08/28/2014 0454   NA 144 04/03/2014 1023   K 4.0 08/28/2014 0454   CL 97* 08/28/2014 0454   CO2 27 08/28/2014 0454   GLUCOSE 118* 08/28/2014 0454   GLUCOSE 94 04/03/2014 1023   BUN 23* 08/28/2014 0454   BUN 16 04/03/2014 1023   CREATININE 1.12 08/28/2014 0454   CALCIUM 8.5* 08/28/2014 0454   GFRNONAA 56* 08/28/2014 0454   GFRAA >60 08/28/2014 0454    CBC    Component Value Date/Time   WBC 23.1* 08/28/2014 0454   WBC 9.5 04/03/2014 1038   RBC 3.98* 08/28/2014 0454   RBC 4.2* 04/03/2014 1038    HGB 12.0* 08/28/2014 0454   HGB 12.0* 04/03/2014 1038   HCT 36.0* 08/28/2014 0454   HCT 40.0* 04/03/2014 1038   PLT 243 08/28/2014 0454   MCV 90.5 08/28/2014 0454   MCV 95.4 04/03/2014 1038   MCH 30.2 08/28/2014 0454   MCH 28.6 04/03/2014 1038   MCHC 33.3 08/28/2014 0454   MCHC 30.0* 04/03/2014 1038   RDW 21.8* 08/28/2014 0454   LYMPHSABS 5.6* 08/25/2014 0700   MONOABS 1.0 08/25/2014 0700   EOSABS 0.0 08/25/2014 0700   BASOSABS 0.0 08/25/2014 0700    Chest tube  IMPRESSION:   HCAP (healthcare-associated pneumonia) Chronic lymphatic leukemia R  effusion  parapneumonic , complex Marked leukocytosis - baseline 11k DNR noted  PLAN:  Discussed in detail with pt & daughter - they would like to avoid VATS but are OK if this would be life saving. He would be high risk for VATS due to advanced age & co-morbidities Fluid is not being drained via tube thoracostomy >> will insert tpa 10 mg daily x 3 days (DNAse not available) - will also ask TCTs to follow   pleural fluid culture so far negative-which is reassuring, continue antibiotic  Paroxysmal Atrial fibrillation -Cardizem drip for rate control   Kara Mead MD. FCCP. [place]  [facility] Critical [facility] Pager [phone] If no response call [phone]

## 2014-08-28 NOTE — Progress Notes (Signed)
Pt temp 100.2. VSS. MD notified will continue to monitor

## 2014-08-28 NOTE — Progress Notes (Signed)
[facility] PROGRESS NOTE  Johnathan Strong:[id number] DOB: [birth year] DOA: 08/25/2014 PCP: Redge Gainer, MD  Assessment/Plan: 1. Probable healthcare associated pneumonia -Patient recently hospitalized discharged on 08/19/2014 in which time was treated for community acquired pneumonia, now presenting with a probable parapneumonic effusion. He underwent ultrasound-guided thoracentesis today that was performed by interventional radiology where a liter of yellow colored fluid was removed, cell count showing white count of 10,299. There were no organisms seen on Gram stain.  -Pulmonary critical care medicine was consulted. Treatment options were discussed with patient and family members including placement of drain catheter versus repeat thoracentesis. Ultimately a VATS procedure may not be well tolerated by him given multiple comorbidities, advanced age along with issues with A. fib with RVR. -Continue broad-spectrum IV and microbial therapy with vancomycin and ceftazidime -Pleural fluid culture showing no growth to date. -Pulmonary medicine recommending TPA injections into pleural space given the amount of fibrous material, and incomplete fluid drainage from chest tube.  2.  Acute hypoxemic respiratory failure -Evidenced by a respiratory rate of 33 with patient having an oxygen requirement of 4 L of supplemental oxygen via nasal cannula -Likely secondary to development of healthcare associated pneumonia -Patient having increased work of breathing this morning, which in part I think may have been caused by A. fib with RVR -Patient having some improvement today  3.  Atrial fibrillation with rapid ventricular response -This morning he was found to be in A. fib having ventricular rates in the 120s to 140s -On his previous hospitalization he had issues with A. fib with RVR. I suspect underlying infectious pulmonary process and acute hypoxemic respiratory failure likely precipitating Afib w  RVR -Heart rate seem better controlled, we'll continue metoprolol 25 mg by mouth 3 times a day along with 2040 mg of by mouth Cardizem  4.  Sepsis -Present on admission, evidenced by a white count of 24,200, respiratory rate of 33, heart rate of 107, with source of infection likely healthcare associated pneumonia -Follow-up on blood cultures and pleural fluid cultures -Continue broad-spectrum empiric IV antibiotic therapy with vancomycin and ceftaz   5.  Hypertension -Because of A. fib with RVR I have increase Cardizem to 240 mg by mouth daily from 120 mg and metoprolol to 25 mg by mouth 3 times a day from BID dosing. With these changes in nodal agents, Cozaar was stopped for now, will monitor heart rates and blood pressures.  6.  Chronic obstructive pulmonary disease -Currently stable  7.  Coronary artery disease -Status post percutaneous intervention to LAD on 09/26/2012, Plavix held  Code Status: DO NOT RESUSCITATE Family Communication: I spoke with his wife and daughter who were present at bedside Disposition Plan: Anticipate patient will be discharged home when medically stable   Consultants:  Pulmonary critical care medicine  Procedures:  Ultrasound-guided thoracentesis performed on 08/26/2014  Antibiotics:  Vancomycin  Ceftaz   HPI/Subjective: Patient is a pleasant 81 year old gentleman with a past medical history of CML, recently discharged from the medicine service on 08/19/2014, admitted on 08/09/2014 during which he was treated for community-acquired pneumonia. Patient showed gradual improvement as he was weaned off of supplemental oxygen, tolerating by mouth intake, and was discharged on Ceftin. His wife reporting that patient did well the week after discharge, got back to his normal routine, then had a decline this past Monday, developing fevers, chills, night sweats. Chest x-ray performed on admission revealed moderate right pleural effusion with associated right  lung base consolidation/collapse. He was started on  broad-spectrum IV antibiotic therapy with vancomycin and ceftazidime, undergoing ultrasound-guided thoracentesis by interventional radiology on 08/26/2014. Pulmonary critical care medicine was consulted regarding pleural effusion as it was felt he would benefit from drainage of fluid. Hospitalization was complicated by the moment of A. fib with RVR on 08/27/2014 likely precipitated by underlying pulmonary infectious process and acute hypoxemic respiratory failure. He was given a dose of IV Lopressor and his metoprolol was increased to 25 mg by mouth 3 times a day after which she achieved better control of heart rates.  Objective: Filed Vitals:   08/28/14 1139  BP: 118/60  Pulse: 92  Temp: 98 F (36.7 C)  Resp: 24    Intake/Output Summary (Last 24 hours) at 08/28/14 1426 Last data filed at 08/28/14 1243  Gross per 24 hour  Intake    960 ml  Output    725 ml  Net    235 ml   Filed Weights   08/25/14 0644 08/28/14 0549  Weight: 74.844 kg (165 lb) 74.798 kg (164 lb 14.4 oz)    Exam:   General: Patient seems a little improved today with regard to shortness of breath however still gets winded with speaking.  Cardiovascular: Irregular rate and rhythm, tachycardic  Respiratory: Patient having near absent breath sounds to his right base, there is dullness percussion to right lower lung, left lung clear to auscultation  Abdomen: Soft nontender nondistended  Musculoskeletal: No edema  Data Reviewed: Basic Metabolic Panel:  Recent Labs Lab 08/25/14 0700 08/26/14 0448 08/27/14 0530 08/28/14 0454  NA 137 136 135 133*  K 4.2 5.1 4.3 4.0  CL 103 99* 100* 97*  CO2 26 28 26 27   GLUCOSE 172* 140* 126* 118*  BUN 14 16 24* 23*  CREATININE 0.97 1.13 1.06 1.12  CALCIUM 8.8* 8.7* 8.6* 8.5*   Liver Function Tests:  Recent Labs Lab 08/25/14 0700 08/26/14 0448  AST 19 13*  ALT 18 14*  ALKPHOS 72 66  BILITOT 1.5* 1.4*  PROT 6.3*  5.8*  ALBUMIN 3.8 3.2*   No results for input(s): LIPASE, AMYLASE in the last 168 hours. No results for input(s): AMMONIA in the last 168 hours. CBC:  Recent Labs Lab 08/25/14 0700 08/26/14 0448 08/27/14 0530 08/28/14 0454  WBC 24.2* 34.0* 24.7* 23.1*  NEUTROABS 17.6*  --   --   --   HGB 12.1* 11.8* 11.4* 12.0*  HCT 37.1* 35.7* 34.3* 36.0*  MCV 92.3 92.2 91.2 90.5  PLT 255 213 204 243   Cardiac Enzymes: No results for input(s): CKTOTAL, CKMB, CKMBINDEX, TROPONINI in the last 168 hours. BNP (last 3 results)  Recent Labs  04/21/14 1541 08/25/14 0700  BNP 484.0* 224.5*    ProBNP (last 3 results) No results for input(s): PROBNP in the last 8760 hours.  CBG: No results for input(s): GLUCAP in the last 168 hours.  Recent Results (from the past 240 hour(s))  Culture, blood (routine x 2)     Status: None (Preliminary result)   Collection Time: 08/25/14  7:00 AM  Result Value Ref Range Status   Specimen Description BLOOD RIGHT ANTECUBITAL  Final   Special Requests BAA 10CC  Final   Culture NO GROWTH 2 DAYS  Final   Report Status PENDING  Incomplete  Culture, blood (routine x 2)     Status: None (Preliminary result)   Collection Time: 08/25/14  7:29 AM  Result Value Ref Range Status   Specimen Description BLOOD RIGHT ASSIST CONTROL  Final   Special  Requests BOTTLES DRAWN AEROBIC AND ANAEROBIC 5CC  Final   Culture NO GROWTH 2 DAYS  Final   Report Status PENDING  Incomplete  Culture, body fluid-bottle     Status: None (Preliminary result)   Collection Time: 08/26/14 11:09 AM  Result Value Ref Range Status   Specimen Description FLUID RIGHT PLEURAL  Final   Special Requests NONE  Final   Culture NO GROWTH < 24 HOURS  Final   Report Status PENDING  Incomplete  Gram stain     Status: None   Collection Time: 08/26/14 11:09 AM  Result Value Ref Range Status   Specimen Description FLUID RIGHT PLEURAL  Final   Special Requests NONE  Final   Gram Stain   Final    ABUNDANT  WBC PRESENT, PREDOMINANTLY PMN NO ORGANISMS SEEN    Report Status 08/26/2014 FINAL  Final     Studies: Dg Chest Port 1 View  08/28/2014   CLINICAL DATA:  Pleural effusion.  Pulmonary infection.  EXAM: PORTABLE CHEST - 1 VIEW  COMPARISON:  08/27/2014.  FINDINGS: Right chest tube in stable position. Mediastinum and hilar structures are normal. Cardiomegaly with normal pulmonary vascularity. Right lower lobe infiltrate consistent with pneumonia. Right pleural effusion. Right chest wall subcutaneous emphysema again noted.  IMPRESSION: 1.  Right chest tube in stable position.  No pneumothorax.  2. Persistent dense right lower lobe infiltrate and right pleural effusion. No interim change.   Electronically Signed   By: Marcello Moores  Register   On: 08/28/2014 08:15   Dg Chest Port 1 View  08/27/2014   CLINICAL DATA:  Chest tube placement.  EXAM: PORTABLE CHEST - 1 VIEW  COMPARISON:  Single view of the chest 08/26/2014.  FINDINGS: A new right chest tube is in place. Right pleural effusion and basilar airspace disease are again seen. No pneumothorax is identified. The left lung appears clear. Heart size is normal.  IMPRESSION: Status post right chest tube placement. Right pleural effusion basilar airspace disease do not appear notably changed. No pneumothorax is seen.   Electronically Signed   By: Inge Rise M.D.   On: 08/27/2014 18:30    Scheduled Meds: . alteplase (tPA) 10mg  in NS 32mL for Dr.Oaks (intrapleural administration/[facility])   Intrapleural Once  . atorvastatin  10 mg Oral q1800  . budesonide-formoterol  2 puff Inhalation BID  . calcium carbonate  1 tablet Oral BID  . cefTAZidime (FORTAZ)  IV  2 g Intravenous Q12H  . diltiazem  240 mg Oral Daily  . docusate sodium  100 mg Oral BID  . metoprolol tartrate  25 mg Oral TID  . montelukast  10 mg Oral QHS  . polyethylene glycol  17 g Oral Daily  . tiotropium  18 mcg Inhalation Daily  . vancomycin  750 mg Intravenous Q12H   Continuous Infusions:    Principal Problem:   HCAP (healthcare-associated pneumonia) Active Problems:   Sepsis   CAD (coronary artery disease)   Atrial fibrillation   Chronic lymphatic leukemia   Pleural effusion on right   Pleural effusion   Pleural effusion, right    Time spent: 35 minutes    Kelvin Cellar  [facility] Pager [phone]. If 7PM-7AM, please contact night-coverage at [email or web address], password [facility] 08/28/2014, 2:26 PM  LOS: 3 days

## 2014-08-28 NOTE — Progress Notes (Signed)
10mg  TPA in 50cc ns injected into pleural space. Chest tube clamped. Pt instructed to lay flat and rotate side-->back-->side 30 minutes each. Then will unclamp.   Erick Colace ACNP-BC [facility] Pager # [phone] OR # [phone] if no answer

## 2014-08-28 NOTE — Progress Notes (Signed)
[place] for Vancomycin, Ceftazidime  Indication: HCAP  Assessment: 85 YOM who presented with SOB and chest pain along with chills and fevers since yesterday. He finished a course of oral Ceftin prior to admission. WBC is elevated at 24.2. Pt is afebrile. Pharmacy consulted to start empiric antibiotics for coverage of HCAP. Patient's SCr is 0.97 which seems to be his baseline. CrCl ~ 43. Vanc trough came back just slightly high.   Cultures: 6/21 Blood Cx x2>> 6/22 pleural>>  Now s/p thoracentesis  Goal of Therapy:  Vancomycin trough level 15-20 mcg/ml  Plan:   Change vanc to 500mg  IV q12 Will get another level if needed  Onnie Boer, PharmD Pager: [phone] 08/28/2014 10:25 PM       Labs:  Recent Labs  08/26/14 0448 08/27/14 0530 08/28/14 0454  WBC 34.0* 24.7* 23.1*  HGB 11.8* 11.4* 12.0*  PLT 213 204 243  CREATININE 1.13 1.06 1.12   Estimated Creatinine Clearance: 43.3 mL/min (by C-G formula based on Cr of 1.12).  Recent Labs  08/28/14 2143  [place] 21*     Microbiology: Recent Results (from the past 720 hour(s))  Culture, blood (routine x 2) Call MD if unable to obtain prior to antibiotics being given     Status: None   Collection Time: 08/09/14  9:33 PM  Result Value Ref Range Status   Specimen Description BLOOD RIGHT ANTECUBITAL  Final   Special Requests   Final    BOTTLES DRAWN AEROBIC AND ANAEROBIC [employer] [facility] [place]   Culture   Final    NO GROWTH 5 DAYS Performed at [facility]    Report Status 08/16/2014 FINAL  Final  Culture, blood (routine x 2) Call MD if unable to obtain prior to antibiotics being given     Status: None   Collection Time: 08/09/14  9:45 PM  Result Value Ref Range Status   Specimen Description BLOOD RIGHT ANTECUBITAL  Final   Special Requests BOTTLES DRAWN AEROBIC AND ANAEROBIC 5CC  Final   Culture   Final    NO GROWTH 5 DAYS Performed at [facility]    Report  Status 08/16/2014 FINAL  Final  Culture, blood (routine x 2)     Status: None (Preliminary result)   Collection Time: 08/25/14  7:00 AM  Result Value Ref Range Status   Specimen Description BLOOD RIGHT ANTECUBITAL  Final   Special Requests BAA 10CC  Final   Culture NO GROWTH 3 DAYS  Final   Report Status PENDING  Incomplete  Culture, blood (routine x 2)     Status: None (Preliminary result)   Collection Time: 08/25/14  7:29 AM  Result Value Ref Range Status   Specimen Description BLOOD RIGHT ANTECUBITAL  Final   Special Requests BOTTLES DRAWN AEROBIC AND ANAEROBIC 5CC  Final   Culture NO GROWTH 3 DAYS  Final   Report Status PENDING  Incomplete  Culture, body fluid-bottle     Status: None (Preliminary result)   Collection Time: 08/26/14 11:09 AM  Result Value Ref Range Status   Specimen Description FLUID RIGHT PLEURAL  Final   Special Requests NONE  Final   Culture NO GROWTH 2 DAYS  Final   Report Status PENDING  Incomplete  Gram stain     Status: None   Collection Time: 08/26/14 11:09 AM  Result Value Ref Range Status   Specimen Description FLUID RIGHT PLEURAL  Final   Special Requests NONE  Final   Gram  Stain   Final    ABUNDANT WBC PRESENT, PREDOMINANTLY PMN NO ORGANISMS SEEN    Report Status 08/26/2014 FINAL  Final

## 2014-08-29 ENCOUNTER — Inpatient Hospital Stay (HOSPITAL_COMMUNITY): Payer: Medicare Other

## 2014-08-29 DIAGNOSIS — J918 Pleural effusion in other conditions classified elsewhere: Secondary | ICD-10-CM

## 2014-08-29 DIAGNOSIS — I482 Chronic atrial fibrillation: Secondary | ICD-10-CM

## 2014-08-29 DIAGNOSIS — J189 Pneumonia, unspecified organism: Secondary | ICD-10-CM | POA: Diagnosis present

## 2014-08-29 DIAGNOSIS — Z9689 Presence of other specified functional implants: Secondary | ICD-10-CM | POA: Diagnosis present

## 2014-08-29 LAB — BASIC METABOLIC PANEL
Anion gap: 7 (ref 5–15)
BUN: 30 mg/dL — AB (ref 6–20)
CHLORIDE: 98 mmol/L — AB (ref 101–111)
CO2: 25 mmol/L (ref 22–32)
Calcium: 8.5 mg/dL — ABNORMAL LOW (ref 8.9–10.3)
Creatinine, Ser: 1.14 mg/dL (ref 0.61–1.24)
GFR calc Af Amer: >60 mL/min (ref >60)
GFR, EST NON AFRICAN AMERICAN: 55 mL/min — AB (ref >60)
GLUCOSE: 116 mg/dL — AB (ref 65–99)
Potassium: 4.2 mmol/L (ref 3.5–5.1)
Sodium: 130 mmol/L — ABNORMAL LOW (ref 135–145)

## 2014-08-29 LAB — CBC
HCT: 32.1 % — ABNORMAL LOW (ref 39.0–52.0)
Hemoglobin: 10.7 g/dL — ABNORMAL LOW (ref 13.0–17.0)
MCH: 30 pg (ref 26.0–34.0)
MCHC: 33.3 g/dL (ref 30.0–36.0)
MCV: 89.9 fL (ref 78.0–100.0)
Platelets: 239 10*3/uL (ref 150–400)
RBC: 3.57 MIL/uL — ABNORMAL LOW (ref 4.22–5.81)
RDW: 22.2 % — ABNORMAL HIGH (ref 11.5–15.5)
WBC: 17.6 10*3/uL — AB (ref 4.0–10.5)

## 2014-08-29 MED ORDER — METOPROLOL TARTRATE 1 MG/ML IV SOLN
5.0000 mg | Freq: Once | INTRAVENOUS | Status: AC
  Administered 2014-08-29: 5 mg via INTRAVENOUS
  Filled 2014-08-29: qty 5

## 2014-08-29 MED ORDER — BISACODYL 10 MG RE SUPP
10.0000 mg | Freq: Once | RECTAL | Status: DC
  Filled 2014-08-29: qty 1

## 2014-08-29 MED ORDER — SODIUM CHLORIDE 0.9 % IJ SOLN
Freq: Once | INTRAVENOUS | Status: AC
  Administered 2014-08-29: 13:00:00 via INTRAPLEURAL
  Filled 2014-08-29: qty 10

## 2014-08-29 MED ORDER — LACTULOSE 10 GM/15ML PO SOLN
20.0000 g | Freq: Every day | ORAL | Status: DC | PRN
  Administered 2014-08-30: 20 g via ORAL
  Filled 2014-08-29: qty 30

## 2014-08-29 NOTE — Progress Notes (Signed)
[facility] PROGRESS NOTE  Johnathan Strong XTG:[id number] DOB: 1923/03/20 DOA: 08/25/2014 PCP: Redge Gainer, MD  Assessment/Plan: 1. Probable healthcare associated pneumonia -Johnathan Strong recently hospitalized discharged on 08/19/2014 in which time was treated for community acquired pneumonia, now presenting with a probable parapneumonic effusion. He underwent ultrasound-guided thoracentesis today that was performed by interventional radiology where a liter of yellow colored fluid was removed, cell count showing white count of 10,299. There were no organisms seen on Gram stain.  -Pulmonary critical care medicine was consulted. Treatment options were discussed with Johnathan Strong and family members including placement of drain catheter versus repeat thoracentesis. Ultimately a VATS procedure may not be well tolerated by him given multiple comorbidities, advanced age along with issues with A. fib with RVR. -Continue broad-spectrum IV and microbial therapy with vancomycin and ceftazidime -Pleural fluid culture showing no growth to date. -Pulmonary medicine givingTPA injections into pleural space given the amount of fibrous material, and incomplete fluid drainage from chest tube. Now having increasing drainage from chest tube.  -Overall seems better today, was assisted out of bed to chair.   2.  Acute hypoxemic respiratory failure -Evidenced by a respiratory rate of 33 with Johnathan Strong having an oxygen requirement of 4 L of supplemental oxygen via nasal cannula -Likely secondary to development of healthcare associated pneumonia and pleural effusion -He looks better today after getting TPA injections into pleural space  3.  Atrial fibrillation with rapid ventricular response -This morning he was found to be in A. fib having ventricular rates in the 120s to 140s -On Johnathan previous hospitalization he had issues with A. fib with RVR. I suspect underlying infectious pulmonary process and acute hypoxemic respiratory  failure likely precipitating Afib w RVR -He continues to have episodes of Afib w RVR, was given 5 mg of IV lopressor today  -Will continue Cardizem 240 mg PO q daily along with Metoprolol 25 mg PO TID  4.  Sepsis -Present on admission, evidenced by a white count of 24,200, respiratory rate of 33, heart rate of 107, with source of infection likely healthcare associated pneumonia -Follow-up on blood cultures and pleural fluid cultures -Continue broad-spectrum empiric IV antibiotic therapy with vancomycin and ceftaz   5.  Hypertension -Because of A. fib with RVR I have increase Cardizem to 240 mg by mouth daily from 120 mg and metoprolol to 25 mg by mouth 3 times a day from BID dosing. With these changes in nodal agents, Cozaar was stopped for now, will monitor heart rates and blood pressures.  6.  Chronic obstructive pulmonary disease -Currently stable  7.  Coronary artery disease -Status post percutaneous intervention to LAD on 09/26/2012, Plavix held  Code Status: DO NOT RESUSCITATE Family Communication: I spoke with Johnathan Strong and Strong who were present at bedside Disposition Plan: Anticipate Johnathan Strong will be discharged home when medically stable   Consultants:  Pulmonary critical care medicine  Procedures:  Ultrasound-guided thoracentesis performed on 08/26/2014  Antibiotics:  Vancomycin  Ceftaz   HPI/Subjective: Johnathan Strong is a pleasant 81 year old gentleman with a past medical history of CML, recently discharged from the medicine service on 08/19/2014, admitted on 08/09/2014 during which he was treated for community-acquired pneumonia. Johnathan Strong showed gradual improvement as he was weaned off of supplemental oxygen, tolerating by mouth intake, and was discharged on Ceftin. Johnathan Strong reporting that Johnathan Strong did well the week after discharge, got back to Johnathan normal routine, then had a decline this past Monday, developing fevers, chills, night sweats. Chest x-ray performed on  admission  revealed moderate right pleural effusion with associated right lung base consolidation/collapse. He was started on broad-spectrum IV antibiotic therapy with vancomycin and ceftazidime, undergoing ultrasound-guided thoracentesis by interventional radiology on 08/26/2014. Pulmonary critical care medicine was consulted regarding pleural effusion as it was felt he would benefit from drainage of fluid. Hospitalization was complicated by the moment of A. fib with RVR on 08/27/2014 likely precipitated by underlying pulmonary infectious process and acute hypoxemic respiratory failure. He was given a dose of IV Lopressor and Johnathan metoprolol was increased to 25 mg by mouth 3 times a day after which she achieved better control of heart rates.  Objective: Filed Vitals:   08/29/14 1635  BP:   Pulse:   Temp: 98.5 F (36.9 C)  Resp:     Intake/Output Summary (Last 24 hours) at 08/29/14 1710 Last data filed at 08/29/14 1136  Gross per 24 hour  Intake    100 ml  Output    350 ml  Net   -250 ml   Filed Weights   08/25/14 0644 08/28/14 0549 08/29/14 0400  Weight: 74.844 kg (165 lb) 74.798 kg (164 lb 14.4 oz) 74.565 kg (164 lb 6.2 oz)    Exam:   General: Johnathan Strong looks a little better today overall. Was assisted out of bed to chair.   Cardiovascular: Irregular rate and rhythm, tachycardic  Respiratory: He has better lung sounds at right lung, CT in place  Abdomen: Soft nontender nondistended  Musculoskeletal: No edema  Data Reviewed: Basic Metabolic Panel:  Recent Labs Lab 08/25/14 0700 08/26/14 0448 08/27/14 0530 08/28/14 0454 08/29/14 0600  NA 137 136 135 133* 130*  K 4.2 5.1 4.3 4.0 4.2  CL 103 99* 100* 97* 98*  CO2 26 28 26 27 25   GLUCOSE 172* 140* 126* 118* 116*  BUN 14 16 24* 23* 30*  CREATININE 0.97 1.13 1.06 1.12 1.14  CALCIUM 8.8* 8.7* 8.6* 8.5* 8.5*   Liver Function Tests:  Recent Labs Lab 08/25/14 0700 08/26/14 0448  AST 19 13*  ALT 18 14*  ALKPHOS 72 66   BILITOT 1.5* 1.4*  PROT 6.3* 5.8*  ALBUMIN 3.8 3.2*   No results for input(s): LIPASE, AMYLASE in the last 168 hours. No results for input(s): AMMONIA in the last 168 hours. CBC:  Recent Labs Lab 08/25/14 0700 08/26/14 0448 08/27/14 0530 08/28/14 0454 08/29/14 0600  WBC 24.2* 34.0* 24.7* 23.1* 17.6*  NEUTROABS 17.6*  --   --   --   --   HGB 12.1* 11.8* 11.4* 12.0* 10.7*  HCT 37.1* 35.7* 34.3* 36.0* 32.1*  MCV 92.3 92.2 91.2 90.5 89.9  PLT 255 213 204 243 239   Cardiac Enzymes: No results for input(s): CKTOTAL, CKMB, CKMBINDEX, TROPONINI in the last 168 hours. BNP (last 3 results)  Recent Labs  04/21/14 1541 08/25/14 0700  BNP 484.0* 224.5*    ProBNP (last 3 results) No results for input(s): PROBNP in the last 8760 hours.  CBG: No results for input(s): GLUCAP in the last 168 hours.  Recent Results (from the past 240 hour(s))  Culture, blood (routine x 2)     Status: None (Preliminary result)   Collection Time: 08/25/14  7:00 AM  Result Value Ref Range Status   Specimen Description BLOOD RIGHT ANTECUBITAL  Final   Special Requests BAA 10CC  Final   Culture NO GROWTH 4 DAYS  Final   Report Status PENDING  Incomplete  Culture, blood (routine x 2)     Status: None (Preliminary result)  Collection Time: 08/25/14  7:29 AM  Result Value Ref Range Status   Specimen Description BLOOD RIGHT ANTECUBITAL  Final   Special Requests BOTTLES DRAWN AEROBIC AND ANAEROBIC 5CC  Final   Culture NO GROWTH 4 DAYS  Final   Report Status PENDING  Incomplete  Culture, body fluid-bottle     Status: None (Preliminary result)   Collection Time: 08/26/14 11:09 AM  Result Value Ref Range Status   Specimen Description FLUID RIGHT PLEURAL  Final   Special Requests NONE  Final   Culture NO GROWTH 3 DAYS  Final   Report Status PENDING  Incomplete  Gram stain     Status: None   Collection Time: 08/26/14 11:09 AM  Result Value Ref Range Status   Specimen Description FLUID RIGHT PLEURAL   Final   Special Requests NONE  Final   Gram Stain   Final    ABUNDANT WBC PRESENT, PREDOMINANTLY PMN NO ORGANISMS SEEN    Report Status 08/26/2014 FINAL  Final     Studies: Dg Chest Port 1 View  08/29/2014   CLINICAL DATA:  Evaluate pleural effusions  EXAM: PORTABLE CHEST - 1 VIEW  COMPARISON:  08/28/2014  FINDINGS: Right chest tube in place. No pneumothorax identified. The right-sided pleural effusion is unchanged in volume from previous exam. There is atelectasis in the right base. Gas is noted within the soft tissues of the right chest wall surrounding the chest tube.  IMPRESSION: 1. No change in right pleural effusion.   Electronically Signed   By: Kerby Moors M.D.   On: 08/29/2014 13:12   Dg Chest Port 1 View  08/28/2014   CLINICAL DATA:  Chest tube placement for effusion.  EXAM: PORTABLE CHEST - 1 VIEW  COMPARISON:  Study obtained earlier in the day  FINDINGS: There is a chest tube on the right without demonstrable pneumothorax. Right effusion is smaller. There is a fairly small right effusion with right base consolidation and patchy atelectasis. Lungs elsewhere clear. There is no appreciable interstitial edema. The heart size and pulmonary vascularity are normal. There is atherosclerotic change in the aorta. No adenopathy. No bone lesions.  IMPRESSION: Chest tube in place on the right. No pneumothorax. Note that there is soft tissue air tracking along the chest tube in the lateral right hemithorax inferiorly. Right pleural effusion is smaller compared to earlier in the day. There is a residual right effusion with right base consolidation and patchy atelectasis.  There is no evidence of pulmonary edema. No change in cardiac silhouette.   Electronically Signed   By: Lowella Grip III M.D.   On: 08/28/2014 15:19   Dg Chest Port 1 View  08/28/2014   CLINICAL DATA:  Pleural effusion.  Pulmonary infection.  EXAM: PORTABLE CHEST - 1 VIEW  COMPARISON:  08/27/2014.  FINDINGS: Right chest tube  in stable position. Mediastinum and hilar structures are normal. Cardiomegaly with normal pulmonary vascularity. Right lower lobe infiltrate consistent with pneumonia. Right pleural effusion. Right chest wall subcutaneous emphysema again noted.  IMPRESSION: 1.  Right chest tube in stable position.  No pneumothorax.  2. Persistent dense right lower lobe infiltrate and right pleural effusion. No interim change.   Electronically Signed   By: Marcello Moores  Register   On: 08/28/2014 08:15   Dg Chest Port 1 View  08/27/2014   CLINICAL DATA:  Chest tube placement.  EXAM: PORTABLE CHEST - 1 VIEW  COMPARISON:  Single view of the chest 08/26/2014.  FINDINGS: A new right chest tube  is in place. Right pleural effusion and basilar airspace disease are again seen. No pneumothorax is identified. The left lung appears clear. Heart size is normal.  IMPRESSION: Status post right chest tube placement. Right pleural effusion basilar airspace disease do not appear notably changed. No pneumothorax is seen.   Electronically Signed   By: Inge Rise M.D.   On: 08/27/2014 18:30    Scheduled Meds: . alteplase (tPA) 10mg  in NS 52mL for Dr.Oaks (intrapleural administration/[facility])   Intrapleural Once  . alteplase (tPA) 10mg  in NS 51mL for Dr.Oaks (intrapleural administration/[facility])   Intrapleural Once  . atorvastatin  10 mg Oral q1800  . bisacodyl  10 mg Rectal Once  . budesonide-formoterol  2 puff Inhalation BID  . calcium carbonate  1 tablet Oral BID  . cefTAZidime (FORTAZ)  IV  2 g Intravenous Q12H  . diltiazem  240 mg Oral Daily  . docusate sodium  100 mg Oral BID  . metoprolol tartrate  25 mg Oral TID  . montelukast  10 mg Oral QHS  . polyethylene glycol  17 g Oral Daily  . tiotropium  18 mcg Inhalation Daily  . vancomycin  500 mg Intravenous Q12H   Continuous Infusions:   Principal Problem:   HCAP (healthcare-associated pneumonia) Active Problems:   Sepsis   CAD (coronary artery disease)   Atrial fibrillation    Chronic lymphatic leukemia   Pleural effusion on right   Pleural effusion   Pleural effusion, right   Blood poisoning    Time spent: 35 minutes    Kelvin Cellar  [facility] Pager [phone]. If 7PM-7AM, please contact night-coverage at [email or web address], password [facility] 08/29/2014, 5:10 PM  LOS: 4 days

## 2014-08-29 NOTE — Progress Notes (Signed)
PULMONARY/CCM NOTE  Requesting MD/Service: August Saucer Date of admission: 6/21 Date of consult: 6/22 Reason for consultation:  parapneumonic effusion  Pt Profile:  45 M admitted to [facility] 6/04 - 6/09 with CAP. CXR on 6/09 revealed R effusion. He failed to recover as anticipated after discharge and readmitted 6/21 with large R effusion. Thoracentesis performed 6/22 revealed exudative characteristics (LDH 341, protein 3.7, with > 10,000 WBC, 93% neutrophils). Fluid was described as bloody. Pulm medicine asked to assist in further eval and mgmt of parapneumonic effusion   Significant tests/ events 6/23  A. fib RVR- transferred to stepdown unit 6/23 [place] of right chest - marked fibrinous tissue and concern for loculation t/o the right chest wall Rt chest tube 6/23 >>  Subjective- Family and nurse in room. Has drained approx 100 cc last shift. Denies pain or dyspnea at rest   Filed Vitals:   08/28/14 2359 08/29/14 0400 08/29/14 0726 08/29/14 0736  BP: 117/57 114/57  153/99  Pulse: 74 110  50  Temp: 98.2 F (36.8 C) 98.4 F (36.9 C) 98.5 F (36.9 C)   TempSrc: Oral Oral Oral   Resp: 22 23  24   Height:      Weight:  74.565 kg (164 lb 6.2 oz)    SpO2: 93% 97%  96%    EXAM:  Gen: WDWN, able to speak in fulls sentences HEENT: WNL Neck: no LAN Lungs: dull to perc with bronchial BS approx 1/2 way up on rt. Serosanguinous fluid in tube, unclamped, no fluctuation Cardiovascular: IRIR, no M noted Abdomen: soft, NT, +BS Ext: no C/C/E Neuro: no focal deficits, strength intact  DATA:  BMET    Component Value Date/Time   NA 130* 08/29/2014 0600   NA 144 04/03/2014 1023   K 4.2 08/29/2014 0600   CL 98* 08/29/2014 0600   CO2 25 08/29/2014 0600   GLUCOSE 116* 08/29/2014 0600   GLUCOSE 94 04/03/2014 1023   BUN 30* 08/29/2014 0600   BUN 16 04/03/2014 1023   CREATININE 1.14 08/29/2014 0600   CALCIUM 8.5* 08/29/2014 0600   GFRNONAA 55* 08/29/2014 0600   GFRAA >60 08/29/2014 0600     CBC    Component Value Date/Time   WBC 17.6* 08/29/2014 0600   WBC 9.5 04/03/2014 1038   RBC 3.57* 08/29/2014 0600   RBC 4.2* 04/03/2014 1038   HGB 10.7* 08/29/2014 0600   HGB 12.0* 04/03/2014 1038   HCT 32.1* 08/29/2014 0600   HCT 40.0* 04/03/2014 1038   PLT 239 08/29/2014 0600   MCV 89.9 08/29/2014 0600   MCV 95.4 04/03/2014 1038   MCH 30.0 08/29/2014 0600   MCH 28.6 04/03/2014 1038   MCHC 33.3 08/29/2014 0600   MCHC 30.0* 04/03/2014 1038   RDW 22.2* 08/29/2014 0600   LYMPHSABS 5.6* 08/25/2014 0700   MONOABS 1.0 08/25/2014 0700   EOSABS 0.0 08/25/2014 0700   BASOSABS 0.0 08/25/2014 0700    Chest tube  IMPRESSION:   HCAP (healthcare-associated pneumonia) Chronic lymphatic leukemia R  effusion  parapneumonic , complex Marked leukocytosis - baseline 11k DNR noted  PLAN:  T-Sgy agrees best to avoid VATS. He would be high risk for VATS due to advanced age & co-morbidities Fluid is now being drained via tube thoracostomy >> will insert tpa 10 mg daily x 3 days (DNAse not available) - will also ask TCTs to follow   pleural fluid culture so far negative-which is reassuring, continue antibiotic  Paroxysmal Atrial fibrillation -Cardizem drip for rate control  CXR ordered 6/25  CD Annamaria Boots, MD [facility] Pulmonary [facility] Critical [facility] Pager [phone] If no response call [phone]

## 2014-08-29 NOTE — Progress Notes (Signed)
Very little pleural output after clamp removed.   Plan Will get CT chest tomorrow to re-eval pleural space.  May not need third [place] ACNP-BC [place] Pager # [phone] OR # [phone] if no answer

## 2014-08-29 NOTE — Progress Notes (Signed)
ducolax moved to 2000, pt wanted to wait to use it until family was out of the room

## 2014-08-29 NOTE — Progress Notes (Signed)
10mg  TPA in 50cc ns injected into pleural space. Chest tube clamped. Pt instructed to lay flat and rotate side-->back-->side 30 minutes each. Then will unclamp.   Erick Colace ACNP-BC [place] Pager # [phone] OR # [phone] if no answer

## 2014-08-30 ENCOUNTER — Inpatient Hospital Stay (HOSPITAL_COMMUNITY): Payer: Medicare Other

## 2014-08-30 DIAGNOSIS — R0602 Shortness of breath: Secondary | ICD-10-CM

## 2014-08-30 DIAGNOSIS — Z789 Other specified health status: Secondary | ICD-10-CM

## 2014-08-30 LAB — BASIC METABOLIC PANEL
Anion gap: 8 (ref 5–15)
BUN: 36 mg/dL — ABNORMAL HIGH (ref 6–20)
CHLORIDE: 98 mmol/L — AB (ref 101–111)
CO2: 24 mmol/L (ref 22–32)
Calcium: 8.2 mg/dL — ABNORMAL LOW (ref 8.9–10.3)
Creatinine, Ser: 1.29 mg/dL — ABNORMAL HIGH (ref 0.61–1.24)
GFR calc Af Amer: 55 mL/min — ABNORMAL LOW (ref >60)
GFR calc non Af Amer: 47 mL/min — ABNORMAL LOW (ref >60)
GLUCOSE: 104 mg/dL — AB (ref 65–99)
Potassium: 4.5 mmol/L (ref 3.5–5.1)
Sodium: 130 mmol/L — ABNORMAL LOW (ref 135–145)

## 2014-08-30 LAB — CULTURE, BLOOD (ROUTINE X 2)
Culture: NO GROWTH
Culture: NO GROWTH

## 2014-08-30 LAB — CBC
HEMATOCRIT: 30.2 % — AB (ref 39.0–52.0)
HEMOGLOBIN: 10.1 g/dL — AB (ref 13.0–17.0)
MCH: 29.4 pg (ref 26.0–34.0)
MCHC: 33.4 g/dL (ref 30.0–36.0)
MCV: 87.8 fL (ref 78.0–100.0)
PLATELETS: 235 10*3/uL (ref 150–400)
RBC: 3.44 MIL/uL — ABNORMAL LOW (ref 4.22–5.81)
RDW: 22 % — ABNORMAL HIGH (ref 11.5–15.5)
WBC: 16.1 10*3/uL — ABNORMAL HIGH (ref 4.0–10.5)

## 2014-08-30 MED ORDER — BISACODYL 10 MG RE SUPP
10.0000 mg | Freq: Once | RECTAL | Status: AC
  Administered 2014-08-30: 10 mg via RECTAL
  Filled 2014-08-30: qty 1

## 2014-08-30 MED ORDER — SENNOSIDES-DOCUSATE SODIUM 8.6-50 MG PO TABS
1.0000 | ORAL_TABLET | Freq: Two times a day (BID) | ORAL | Status: DC
  Administered 2014-08-30 – 2014-09-12 (×14): 1 via ORAL
  Filled 2014-08-30 (×21): qty 1

## 2014-08-30 MED ORDER — SODIUM CHLORIDE 0.9 % IV SOLN
INTRAVENOUS | Status: DC
  Administered 2014-08-30 (×2): via INTRAVENOUS

## 2014-08-30 MED ORDER — SODIUM CHLORIDE 0.9 % IV BOLUS (SEPSIS)
500.0000 mL | Freq: Once | INTRAVENOUS | Status: AC
  Administered 2014-08-30: 500 mL via INTRAVENOUS

## 2014-08-30 MED ORDER — SODIUM CHLORIDE 0.9 % IJ SOLN
Freq: Once | INTRAVENOUS | Status: AC
  Administered 2014-08-30: 14:00:00 via INTRAPLEURAL
  Filled 2014-08-30: qty 10

## 2014-08-30 NOTE — Progress Notes (Signed)
[facility] PROGRESS NOTE  Johnathan Strong NWG:[id number] DOB: Jul 30, 1923 DOA: 08/25/2014 PCP: Redge Gainer, MD  Assessment/Plan: 1. Probable healthcare associated pneumonia -Patient recently hospitalized discharged on 08/19/2014 in which time was treated for community acquired pneumonia, now presenting with a probable parapneumonic effusion. He underwent ultrasound-guided thoracentesis today that was performed by interventional radiology where a liter of yellow colored fluid was removed, cell count showing white count of 10,299. There were no organisms seen on Gram stain.  -Pulmonary critical care medicine was consulted. Treatment options were discussed with patient and family members including placement of drain catheter versus repeat thoracentesis. Ultimately a VATS procedure may not be well tolerated by him given multiple comorbidities, advanced age along with issues with A. fib with RVR. -Continue broad-spectrum IV and microbial therapy with vancomycin and ceftazidime -Pleural fluid culture showing no growth to date. -Pulmonary medicine givingTPA injections into pleural space given the amount of fibrous material, and incomplete fluid drainage from chest tube. Now having increasing drainage from chest tube.  -Patient having a repeat chest CT which showed interval improvement in right-sided pleural effusion. Patient was seen and evaluated by Dr. Roxan Hockey of cardiothoracic surgery who did not recommend VATS -White count trending down to 16,100, from 34,000 on 08/26/2014  2.  Acute hypoxemic respiratory failure -Evidenced by a respiratory rate of 33 with patient having an oxygen requirement of 4 L of supplemental oxygen via nasal cannula -Likely secondary to development of healthcare associated pneumonia and pleural effusion  3.  Atrial fibrillation with rapid ventricular response -This morning he was found to be in A. fib having ventricular rates in the 120s to 140s -On his previous  hospitalization he had issues with A. fib with RVR. I suspect underlying infectious pulmonary process and acute hypoxemic respiratory failure likely precipitating Afib w RVR -He continues to have episodes of Afib w RVR, was given 5 mg of IV lopressor   -Will continue Cardizem 240 mg PO q daily along with Metoprolol 25 mg PO TID  4.  Sepsis -Present on admission, evidenced by a white count of 24,200, respiratory rate of 33, heart rate of 107, with source of infection likely healthcare associated pneumonia -Follow-up on blood cultures and pleural fluid cultures -Continue broad-spectrum empiric IV antibiotic therapy with vancomycin and ceftaz   5.  Hypertension -Because of A. fib with RVR I have increase Cardizem to 240 mg by mouth daily from 120 mg and metoprolol to 25 mg by mouth 3 times a day from BID dosing. With these changes in nodal agents, Cozaar was stopped for now, will monitor heart rates and blood pressures.  6.  Acute kidney injury -Patient have an upward trend in creatinine from 1.14 to 1.29 on a.m. lab work. I think this may be related to prerenal azotemia and will provide a 500 mL bolus fluid challenge, and start maintenance fluids at 75 mL/hour -Repeat labs in a.m.  7.  Coronary artery disease -Status post percutaneous intervention to LAD on 09/26/2012, Plavix held  Code Status: DO NOT RESUSCITATE Family Communication: I spoke with his wife and daughter who were present at bedside Disposition Plan: Anticipate patient will be discharged home when medically stable   Consultants:  Pulmonary critical care medicine  Procedures:  Ultrasound-guided thoracentesis performed on 08/26/2014  Antibiotics:  Vancomycin  Ceftaz   HPI/Subjective: Patient is a pleasant 81 year old gentleman with a past medical history of CML, recently discharged from the medicine service on 08/19/2014, admitted on 08/09/2014 during which he was treated for  community-acquired pneumonia. Patient  showed gradual improvement as he was weaned off of supplemental oxygen, tolerating by mouth intake, and was discharged on Ceftin. His wife reporting that patient did well the week after discharge, got back to his normal routine, then had a decline this past Monday, developing fevers, chills, night sweats. Chest x-ray performed on admission revealed moderate right pleural effusion with associated right lung base consolidation/collapse. He was started on broad-spectrum IV antibiotic therapy with vancomycin and ceftazidime, undergoing ultrasound-guided thoracentesis by interventional radiology on 08/26/2014. Pulmonary critical care medicine was consulted regarding pleural effusion as it was felt he would benefit from drainage of fluid. Hospitalization was complicated by the moment of A. fib with RVR on 08/27/2014 likely precipitated by underlying pulmonary infectious process and acute hypoxemic respiratory failure. He was given a dose of IV Lopressor and his metoprolol was increased to 25 mg by mouth 3 times a day after which she achieved better control of heart rates.  Objective: Filed Vitals:   08/30/14 1253  BP:   Pulse: 152  Temp:   Resp:     Intake/Output Summary (Last 24 hours) at 08/30/14 1521 Last data filed at 08/30/14 1333  Gross per 24 hour  Intake   1470 ml  Output    300 ml  Net   1170 ml   Filed Weights   08/28/14 0549 08/29/14 0400 08/30/14 0400  Weight: 74.798 kg (164 lb 14.4 oz) 74.565 kg (164 lb 6.2 oz) 74.322 kg (163 lb 13.6 oz)    Exam:   General: Patient looks a little better today overall. Was assisted out of bed to chair.   Cardiovascular: Irregular rate and rhythm, tachycardic  Respiratory: He has better lung sounds at right lung, CT in place  Abdomen: Soft nontender nondistended  Musculoskeletal: No edema  Data Reviewed: Basic Metabolic Panel:  Recent Labs Lab 08/26/14 0448 08/27/14 0530 08/28/14 0454 08/29/14 0600 08/30/14 0410  NA 136 135 133*  130* 130*  K 5.1 4.3 4.0 4.2 4.5  CL 99* 100* 97* 98* 98*  CO2 28 26 27 25 24   GLUCOSE 140* 126* 118* 116* 104*  BUN 16 24* 23* 30* 36*  CREATININE 1.13 1.06 1.12 1.14 1.29*  CALCIUM 8.7* 8.6* 8.5* 8.5* 8.2*   Liver Function Tests:  Recent Labs Lab 08/25/14 0700 08/26/14 0448  AST 19 13*  ALT 18 14*  ALKPHOS 72 66  BILITOT 1.5* 1.4*  PROT 6.3* 5.8*  ALBUMIN 3.8 3.2*   No results for input(s): LIPASE, AMYLASE in the last 168 hours. No results for input(s): AMMONIA in the last 168 hours. CBC:  Recent Labs Lab 08/25/14 0700 08/26/14 0448 08/27/14 0530 08/28/14 0454 08/29/14 0600 08/30/14 0410  WBC 24.2* 34.0* 24.7* 23.1* 17.6* 16.1*  NEUTROABS 17.6*  --   --   --   --   --   HGB 12.1* 11.8* 11.4* 12.0* 10.7* 10.1*  HCT 37.1* 35.7* 34.3* 36.0* 32.1* 30.2*  MCV 92.3 92.2 91.2 90.5 89.9 87.8  PLT 255 213 204 243 239 235   Cardiac Enzymes: No results for input(s): CKTOTAL, CKMB, CKMBINDEX, TROPONINI in the last 168 hours. BNP (last 3 results)  Recent Labs  04/21/14 1541 08/25/14 0700  BNP 484.0* 224.5*    ProBNP (last 3 results) No results for input(s): PROBNP in the last 8760 hours.  CBG: No results for input(s): GLUCAP in the last 168 hours.  Recent Results (from the past 240 hour(s))  Culture, blood (routine x 2)  Status: None   Collection Time: 08/25/14  7:00 AM  Result Value Ref Range Status   Specimen Description BLOOD RIGHT ANTECUBITAL  Final   Special Requests BAA 10CC  Final   Culture NO GROWTH 5 DAYS  Final   Report Status 08/30/2014 FINAL  Final  Culture, blood (routine x 2)     Status: None   Collection Time: 08/25/14  7:29 AM  Result Value Ref Range Status   Specimen Description BLOOD RIGHT ANTECUBITAL  Final   Special Requests BOTTLES DRAWN AEROBIC AND ANAEROBIC 5CC  Final   Culture NO GROWTH 5 DAYS  Final   Report Status 08/30/2014 FINAL  Final  Culture, body fluid-bottle     Status: None (Preliminary result)   Collection Time:  08/26/14 11:09 AM  Result Value Ref Range Status   Specimen Description FLUID RIGHT PLEURAL  Final   Special Requests NONE  Final   Culture NO GROWTH 4 DAYS  Final   Report Status PENDING  Incomplete  Gram stain     Status: None   Collection Time: 08/26/14 11:09 AM  Result Value Ref Range Status   Specimen Description FLUID RIGHT PLEURAL  Final   Special Requests NONE  Final   Gram Stain   Final    ABUNDANT WBC PRESENT, PREDOMINANTLY PMN NO ORGANISMS SEEN    Report Status 08/26/2014 FINAL  Final     Studies: Ct Chest Wo Contrast  08/30/2014   CLINICAL DATA:  Right pleural effusion and right-sided chest tube. Patient receiving intrapleural tPA.  EXAM: CT CHEST WITHOUT CONTRAST  TECHNIQUE: Multidetector CT imaging of the chest was performed following the standard protocol without IV contrast.  COMPARISON:  CT 08/25/2014 and chest x-ray 08/29/2014  FINDINGS: Right anterior lateral chest tube courses into the posterior pleural space at the level of the mid lungs as tip is within the most superior aspect of the pleural fluid collection. Minimal subcutaneous emphysema over the right flank. There is a moderate size right pleural effusion with mild interval improvement. There is continued compressive atelectasis over the right lower lobe and right middle lobes. Small collection of air within the pleural space over the anterior aspect of the pleural effusion near the entry site at the chest tube. Calcified granuloma over the left lower lobe. Minimal scarring over the posterior left lower lobe. Airways are within normal.  Mild stable cardiomegaly. Calcification of the mitral valve annulus. Moderate calcification of the left main and 3 vessel coronary arteries unchanged. Coronary stents present. Tiny amount of pericardial fluid present. Mild-to-moderate calcified plaque over the thoracic aorta. No mediastinal, hilar or axillary adenopathy. Stable sub cm lymph node over the right retrocrural region at the  level of the diaphragm.  Images through the upper abdomen are unchanged. There are degenerative changes of the spine.  IMPRESSION: Interval improvement in a moderate size right pleural fluid collection with associated compressive atelectasis over the right middle and lower lobes. Right-sided chest tube in with tip over the posterior pleural space in the most superior aspect of the pleural fluid collection.  Prior granulomas disease.  Atherosclerotic coronary artery disease with coronary stents present. Mild calcification of the mitral valve annulus. Mild cardiomegaly.   Electronically Signed   By: Marin Olp M.D.   On: 08/30/2014 09:31   Dg Chest Port 1 View  08/29/2014   CLINICAL DATA:  Evaluate pleural effusions  EXAM: PORTABLE CHEST - 1 VIEW  COMPARISON:  08/28/2014  FINDINGS: Right chest tube in place. No pneumothorax  identified. The right-sided pleural effusion is unchanged in volume from previous exam. There is atelectasis in the right base. Gas is noted within the soft tissues of the right chest wall surrounding the chest tube.  IMPRESSION: 1. No change in right pleural effusion.   Electronically Signed   By: Kerby Moors M.D.   On: 08/29/2014 13:12    Scheduled Meds: . atorvastatin  10 mg Oral q1800  . budesonide-formoterol  2 puff Inhalation BID  . calcium carbonate  1 tablet Oral BID  . cefTAZidime (FORTAZ)  IV  2 g Intravenous Q12H  . diltiazem  240 mg Oral Daily  . metoprolol tartrate  25 mg Oral TID  . montelukast  10 mg Oral QHS  . polyethylene glycol  17 g Oral Daily  . senna-docusate  1 tablet Oral BID  . tiotropium  18 mcg Inhalation Daily  . vancomycin  500 mg Intravenous Q12H   Continuous Infusions: . sodium chloride      Principal Problem:   HCAP (healthcare-associated pneumonia) Active Problems:   Sepsis   CAD (coronary artery disease)   Atrial fibrillation   Chronic lymphatic leukemia   Pleural effusion on right   Pleural effusion   Pleural effusion, right    Blood poisoning   Chest tube in place   Pleural effusion associated with pulmonary infection    Time spent: 35 minutes    Kelvin Cellar  [facility] Pager [phone]. If 7PM-7AM, please contact night-coverage at [email or web address], password [facility] 08/30/2014, 3:21 PM  LOS: 5 days

## 2014-08-30 NOTE — Progress Notes (Signed)
PULMONARY/CCM NOTE  Requesting MD/Service: August Saucer Date of admission: 6/21 Date of consult: 6/22 Reason for consultation:  parapneumonic effusion  Pt Profile:  37 M admitted to [facility] 6/04 - 6/09 with CAP. CXR on 6/09 revealed R effusion. He failed to recover as anticipated after discharge and readmitted 6/21 with large R effusion. Thoracentesis performed 6/22 revealed exudative characteristics (LDH 341, protein 3.7, with > 10,000 WBC, 93% neutrophils). Fluid was described as bloody. Pulm medicine asked to assist in further eval and mgmt of parapneumonic effusion   Significant tests/ events 6/23  A. fib RVR- transferred to stepdown unit 6/23 [place] of right chest - marked fibrinous tissue and concern for loculation t/o the right chest wall Rt chest tube 6/23 >>  Subjective- Nurse in room. . Denies pain or dyspnea at rest   Filed Vitals:   08/29/14 2000 08/29/14 2236 08/29/14 2353 08/30/14 0400  BP: 111/42  109/47 110/45  Pulse: 94  82 113  Temp: 97.9 F (36.6 C)  98.2 F (36.8 C) 98.7 F (37.1 C)  TempSrc: Oral  Oral Oral  Resp: 27  18 18   Height:      Weight:    74.322 kg (163 lb 13.6 oz)  SpO2: 96% 100% 100% 100%    EXAM:  Gen: WDWN, able to speak in fulls sentences HEENT: WNL Neck: no LAN Lungs: dull to perc with bronchial BS approx 1/2 way up on rt.  Cardiovascular: IRIR, no M noted Abdomen: soft, NT, +BS Ext: no C/C/E Neuro: no focal deficits, strength intact  DATA:  BMET    Component Value Date/Time   NA 130* 08/30/2014 0410   NA 144 04/03/2014 1023   K 4.5 08/30/2014 0410   CL 98* 08/30/2014 0410   CO2 24 08/30/2014 0410   GLUCOSE 104* 08/30/2014 0410   GLUCOSE 94 04/03/2014 1023   BUN 36* 08/30/2014 0410   BUN 16 04/03/2014 1023   CREATININE 1.29* 08/30/2014 0410   CALCIUM 8.2* 08/30/2014 0410   GFRNONAA 47* 08/30/2014 0410   GFRAA 55* 08/30/2014 0410    CBC    Component Value Date/Time   WBC 16.1* 08/30/2014 0410   WBC 9.5 04/03/2014 1038   RBC 3.44* 08/30/2014 0410   RBC 4.2* 04/03/2014 1038   HGB 10.1* 08/30/2014 0410   HGB 12.0* 04/03/2014 1038   HCT 30.2* 08/30/2014 0410   HCT 40.0* 04/03/2014 1038   PLT 235 08/30/2014 0410   MCV 87.8 08/30/2014 0410   MCV 95.4 04/03/2014 1038   MCH 29.4 08/30/2014 0410   MCH 28.6 04/03/2014 1038   MCHC 33.4 08/30/2014 0410   MCHC 30.0* 04/03/2014 1038   RDW 22.0* 08/30/2014 0410   LYMPHSABS 5.6* 08/25/2014 0700   MONOABS 1.0 08/25/2014 0700   EOSABS 0.0 08/25/2014 0700   BASOSABS 0.0 08/25/2014 0700    Chest tube- intact after transport back from CT scan  CT scan 6/26- images reviewed pending Radiology report. There is residual loculation. Tube placement ok.   IMPRESSION:   HCAP (healthcare-associated pneumonia) Chronic lymphatic leukemia R  effusion  parapneumonic , complex.  We can expect residual rind. Not sure how much more good additional TPA can do. Marked leukocytosis - baseline 11k DNR noted  PLAN:  T-Sgy agrees best to avoid VATS. He would be high risk for VATS due to advanced age & co-morbidities Fluid is now being drained via tube thoracostomy >> will insert tpa 10 mg daily x 3 days (DNAse not available) - will also ask TCTs  to follow   pleural fluid culture so far negative-which is reassuring, continue antibiotic  Paroxysmal Atrial fibrillation -rate managed by primary team    CD Annamaria Boots, MD [facility] Pulmonary [facility] Critical [facility] Pager [phone] If no response call [phone]

## 2014-08-30 NOTE — Progress Notes (Signed)
Pt up to the chair this afternoon. States he feels a little better this evening. Dressing reinforced. Etta Quill, RN

## 2014-08-30 NOTE — Progress Notes (Signed)
Third pleural TPA treatment completed. Pt tolerated well. Unfortunately only scant return once again.. ~ 100 ml.   Plan Will cont CT at current sxn Appreciate Dr Hendrickson's  Input. Will d/w pulmonary group in am check out pros/cons of CT guided drain in [place] vs leave things as is.  Have updated family.   Erick Colace ACNP-BC [place] Pager # [phone] OR # [phone] if no answer

## 2014-08-30 NOTE — Progress Notes (Signed)
  Subjective: Denies shortness of breath this AM  Objective: Vital signs in last 24 hours: Temp:  [97.9 F (36.6 C)-98.7 F (37.1 C)] 98.7 F (37.1 C) (06/26 0400) Pulse Rate:  [47-113] 54 (06/26 1030) Cardiac Rhythm:  [-] Atrial fibrillation (06/26 0842) Resp:  [18-29] 18 (06/26 0400) BP: (109-148)/(37-79) 148/64 mmHg (06/26 1030) SpO2:  [93 %-100 %] 93 % (06/26 1030) Weight:  [163 lb 13.6 oz (74.322 kg)] 163 lb 13.6 oz (74.322 kg) (06/26 0400)  Hemodynamic parameters for last 24 hours:    Intake/Output from previous day: 06/25 0701 - 06/26 0700 In: 1050 [P.O.:600; IV Piggyback:450] Out: 351 [Urine:201; Chest Tube:150] Intake/Output this shift: Total I/O In: 720 [P.O.:720] Out: 0   General appearance: alert and cooperative Heart: irregularly irregular rhythm Lungs: diminished breath sounds right base  Lab Results:  Recent Labs  08/29/14 0600 08/30/14 0410  WBC 17.6* 16.1*  HGB 10.7* 10.1*  HCT 32.1* 30.2*  PLT 239 235   BMET:  Recent Labs  08/29/14 0600 08/30/14 0410  NA 130* 130*  K 4.2 4.5  CL 98* 98*  CO2 25 24  GLUCOSE 116* 104*  BUN 30* 36*  CREATININE 1.14 1.29*  CALCIUM 8.5* 8.2*    PT/INR: No results for input(s): LABPROT, INR in the last 72 hours. ABG    Component Value Date/Time   TCO2 28 10/19/2008 0850   CBG (last 3)  No results for input(s): GLUCAP in the last 72 hours.  Assessment/Plan: S/P   -  81 yo man with right parapneumonic effusion.  I reviewed the chest CT from this morning. There is significant improvement in the right pleural effusion in the interim since the last scan. There is still a moderate amount of fluid present. The chest tube tip is just above it.   I do not think there is enough of an effusion at this point to warrant surgery. If there is concern with the remaining effusion, a CT or US guided pigtail could be placed followed by additional thrombolytics.  Revonda Standard Roxan Hockey, MD [facility] [phone]     LOS: 5 days    Melrose Nakayama 08/30/2014

## 2014-08-31 ENCOUNTER — Inpatient Hospital Stay (HOSPITAL_COMMUNITY): Payer: Medicare Other

## 2014-08-31 DIAGNOSIS — I480 Paroxysmal atrial fibrillation: Secondary | ICD-10-CM

## 2014-08-31 LAB — CULTURE, BODY FLUID-BOTTLE

## 2014-08-31 LAB — CBC
HEMATOCRIT: 30.2 % — AB (ref 39.0–52.0)
Hemoglobin: 10.1 g/dL — ABNORMAL LOW (ref 13.0–17.0)
MCH: 29.6 pg (ref 26.0–34.0)
MCHC: 33.4 g/dL (ref 30.0–36.0)
MCV: 88.6 fL (ref 78.0–100.0)
Platelets: 231 10*3/uL (ref 150–400)
RBC: 3.41 MIL/uL — ABNORMAL LOW (ref 4.22–5.81)
RDW: 22.5 % — ABNORMAL HIGH (ref 11.5–15.5)
WBC: 21 10*3/uL — ABNORMAL HIGH (ref 4.0–10.5)

## 2014-08-31 LAB — BASIC METABOLIC PANEL
Anion gap: 8 (ref 5–15)
BUN: 29 mg/dL — AB (ref 6–20)
CALCIUM: 7.9 mg/dL — AB (ref 8.9–10.3)
CO2: 21 mmol/L — AB (ref 22–32)
Chloride: 100 mmol/L — ABNORMAL LOW (ref 101–111)
Creatinine, Ser: 1.24 mg/dL (ref 0.61–1.24)
GFR calc Af Amer: 58 mL/min — ABNORMAL LOW (ref >60)
GFR calc non Af Amer: 50 mL/min — ABNORMAL LOW (ref >60)
Glucose, Bld: 110 mg/dL — ABNORMAL HIGH (ref 65–99)
POTASSIUM: 5 mmol/L (ref 3.5–5.1)
SODIUM: 129 mmol/L — AB (ref 135–145)

## 2014-08-31 LAB — CULTURE, BODY FLUID W GRAM STAIN -BOTTLE: Culture: NO GROWTH

## 2014-08-31 LAB — VANCOMYCIN, TROUGH: VANCOMYCIN TR: 15 ug/mL (ref 10.0–20.0)

## 2014-08-31 MED ORDER — ENSURE ENLIVE PO LIQD
237.0000 mL | Freq: Three times a day (TID) | ORAL | Status: DC
  Administered 2014-08-31 – 2014-09-15 (×23): 237 mL via ORAL

## 2014-08-31 NOTE — Progress Notes (Signed)
[facility] PROGRESS NOTE  Johnathan Strong VVO:[id number] DOB: 1923/07/09 DOA: 08/25/2014 PCP: Redge Gainer, MD  Assessment/Plan: 1. Probable healthcare associated pneumonia -Patient recently hospitalized discharged on 08/19/2014 in which time was treated for community acquired pneumonia, now presenting with a probable parapneumonic effusion. He underwent ultrasound-guided thoracentesis today that was performed by interventional radiology where a liter of yellow colored fluid was removed, cell count showing white count of 10,299. There were no organisms seen on Gram stain.  -Pulmonary critical care medicine was consulted. Treatment options were discussed with patient and family members including placement of drain catheter versus repeat thoracentesis. Ultimately a VATS procedure may not be well tolerated by him given multiple comorbidities, advanced age along with issues with A. fib with RVR. -Continue broad-spectrum IV and microbial therapy with vancomycin and ceftazidime -Pleural fluid culture showing no growth to date. -Pulmonary medicine givingTPA injections into pleural space given the amount of fibrous material, and incomplete fluid drainage from chest tube. Now having increasing drainage from chest tube.  -Patient having a repeat chest CT which showed interval improvement in right-sided pleural effusion. Patient was seen and evaluated by Dr. Roxan Hockey of cardiothoracic surgery who did not recommend VATS -White count trending down to 16,100, from 34,000 on 08/26/2014 -Repeat chest x-ray on 08/31/2014 showing persistent moderate size inferior right pleural effusion. There is concern about the potential complications of having a complete drainage of pleural fluid including ongoing compressive atelectasis and infection. Pulmonary critical care medicine recommending interventional radiology consultation for pigtail catheter placement  2.  Acute hypoxemic respiratory failure -Evidenced by a  respiratory rate of 33 with patient having an oxygen requirement of 4 L of supplemental oxygen via nasal cannula -Likely secondary to development of healthcare associated pneumonia and pleural effusion  3.  Atrial fibrillation with rapid ventricular response -This morning he was found to be in A. fib having ventricular rates in the 120s to 140s -On his previous hospitalization he had issues with A. fib with RVR. I suspect underlying infectious pulmonary process and acute hypoxemic respiratory failure likely precipitating Afib w RVR -He continues to have episodes of Afib w RVR, was given 5 mg of IV lopressor   -Will continue Cardizem 240 mg PO q daily along with Metoprolol 25 mg PO TID  4.  Sepsis -Present on admission, evidenced by a white count of 24,200, respiratory rate of 33, heart rate of 107, with source of infection likely healthcare associated pneumonia -Follow-up on blood cultures and pleural fluid cultures -Continue broad-spectrum empiric IV antibiotic therapy with vancomycin and ceftaz   5.  Hypertension -Because of A. fib with RVR I have increase Cardizem to 240 mg by mouth daily from 120 mg and metoprolol to 25 mg by mouth 3 times a day from BID dosing. With these changes in nodal agents, Cozaar was stopped for now, will monitor heart rates and blood pressures.  6.  Acute kidney injury -Kidney function slightly improved with BUN coming down from 36-29 and creatinine from 1.29-1.24.  7.  Coronary artery disease -Status post percutaneous intervention to LAD on 09/26/2012, Plavix held  Code Status: DO NOT RESUSCITATE Family Communication: I spoke with his wife and daughter who were present at bedside Disposition Plan: Anticipate patient will be discharged home when medically stable   Consultants:  Pulmonary critical care medicine  Procedures:  Ultrasound-guided thoracentesis performed on 08/26/2014  Antibiotics:  Vancomycin  Ceftaz   HPI/Subjective: Patient is a  pleasant 81 year old gentleman with a past medical history of CML,  recently discharged from the medicine service on 08/19/2014, admitted on 08/09/2014 during which he was treated for community-acquired pneumonia. Patient showed gradual improvement as he was weaned off of supplemental oxygen, tolerating by mouth intake, and was discharged on Ceftin. His wife reporting that patient did well the week after discharge, got back to his normal routine, then had a decline this past Monday, developing fevers, chills, night sweats. Chest x-ray performed on admission revealed moderate right pleural effusion with associated right lung base consolidation/collapse. He was started on broad-spectrum IV antibiotic therapy with vancomycin and ceftazidime, undergoing ultrasound-guided thoracentesis by interventional radiology on 08/26/2014. Pulmonary critical care medicine was consulted regarding pleural effusion as it was felt he would benefit from drainage of fluid. Hospitalization was complicated by the moment of A. fib with RVR on 08/27/2014 likely precipitated by underlying pulmonary infectious process and acute hypoxemic respiratory failure. He was given a dose of IV Lopressor and his metoprolol was increased to 25 mg by mouth 3 times a day after which she achieved better control of heart rates.  Objective: Filed Vitals:   08/31/14 1614  BP:   Pulse:   Temp: 97.7 F (36.5 C)  Resp:     Intake/Output Summary (Last 24 hours) at 08/31/14 1706 Last data filed at 08/31/14 1401  Gross per 24 hour  Intake 2492.5 ml  Output   1250 ml  Net 1242.5 ml   Filed Weights   08/29/14 0400 08/30/14 0400 08/31/14 0500  Weight: 74.565 kg (164 lb 6.2 oz) 74.322 kg (163 lb 13.6 oz) 76.34 kg (168 lb 4.8 oz)    Exam:   General: Patient looks a little better today overall. Was assisted out of bed to chair.   Cardiovascular: Irregular rate and rhythm, tachycardic  Respiratory: He has better lung sounds at right lung, CT in  place  Abdomen: Soft nontender nondistended  Musculoskeletal: No edema  Data Reviewed: Basic Metabolic Panel:  Recent Labs Lab 08/27/14 0530 08/28/14 0454 08/29/14 0600 08/30/14 0410 08/31/14 0320  NA 135 133* 130* 130* 129*  K 4.3 4.0 4.2 4.5 5.0  CL 100* 97* 98* 98* 100*  CO2 26 27 25 24  21*  GLUCOSE 126* 118* 116* 104* 110*  BUN 24* 23* 30* 36* 29*  CREATININE 1.06 1.12 1.14 1.29* 1.24  CALCIUM 8.6* 8.5* 8.5* 8.2* 7.9*   Liver Function Tests:  Recent Labs Lab 08/25/14 0700 08/26/14 0448  AST 19 13*  ALT 18 14*  ALKPHOS 72 66  BILITOT 1.5* 1.4*  PROT 6.3* 5.8*  ALBUMIN 3.8 3.2*   No results for input(s): LIPASE, AMYLASE in the last 168 hours. No results for input(s): AMMONIA in the last 168 hours. CBC:  Recent Labs Lab 08/25/14 0700  08/27/14 0530 08/28/14 0454 08/29/14 0600 08/30/14 0410 08/31/14 0320  WBC 24.2*  < > 24.7* 23.1* 17.6* 16.1* 21.0*  NEUTROABS 17.6*  --   --   --   --   --   --   HGB 12.1*  < > 11.4* 12.0* 10.7* 10.1* 10.1*  HCT 37.1*  < > 34.3* 36.0* 32.1* 30.2* 30.2*  MCV 92.3  < > 91.2 90.5 89.9 87.8 88.6  PLT 255  < > 204 243 239 235 231  < > = values in this interval not displayed. Cardiac Enzymes: No results for input(s): CKTOTAL, CKMB, CKMBINDEX, TROPONINI in the last 168 hours. BNP (last 3 results)  Recent Labs  04/21/14 1541 08/25/14 0700  BNP 484.0* 224.5*    ProBNP (last  3 results) No results for input(s): PROBNP in the last 8760 hours.  CBG: No results for input(s): GLUCAP in the last 168 hours.  Recent Results (from the past 240 hour(s))  Culture, blood (routine x 2)     Status: None   Collection Time: 08/25/14  7:00 AM  Result Value Ref Range Status   Specimen Description BLOOD RIGHT ANTECUBITAL  Final   Special Requests BAA 10CC  Final   Culture NO GROWTH 5 DAYS  Final   Report Status 08/30/2014 FINAL  Final  Culture, blood (routine x 2)     Status: None   Collection Time: 08/25/14  7:29 AM  Result  Value Ref Range Status   Specimen Description BLOOD RIGHT ANTECUBITAL  Final   Special Requests BOTTLES DRAWN AEROBIC AND ANAEROBIC 5CC  Final   Culture NO GROWTH 5 DAYS  Final   Report Status 08/30/2014 FINAL  Final  Culture, body fluid-bottle     Status: None   Collection Time: 08/26/14 11:09 AM  Result Value Ref Range Status   Specimen Description FLUID RIGHT PLEURAL  Final   Special Requests NONE  Final   Culture NO GROWTH 5 DAYS  Final   Report Status 08/31/2014 FINAL  Final  Gram stain     Status: None   Collection Time: 08/26/14 11:09 AM  Result Value Ref Range Status   Specimen Description FLUID RIGHT PLEURAL  Final   Special Requests NONE  Final   Gram Stain   Final    ABUNDANT WBC PRESENT, PREDOMINANTLY PMN NO ORGANISMS SEEN    Report Status 08/26/2014 FINAL  Final     Studies: Dg Chest 2 View  08/31/2014   CLINICAL DATA:  Right pleural effusion with chest tube drainage, shortness of breath.  EXAM: CHEST  2 VIEW  COMPARISON:  Chest x-ray of June Jul 29, 2014 and chest CT scan of August 30, 2014.  FINDINGS: On the right there is mild volume loss and a moderate-sized pleural effusion. The right chest tube tip projects over the posterior aspect of the seventh rib and is unchanged. There is atelectasis or pneumonia at the right lung base which is stable. The left lung is well-expanded. There is a stable approximately 11 mm calcified nodule in the left lower lobe. There is no mediastinal shift. The cardiac silhouette is mildly enlarged. The pulmonary vascularity is normal. The bony thorax exhibits no acute abnormality.  IMPRESSION: Persistent moderate-sized inferior right pleural effusion. The tip of the right chest tube lies along the superior aspect of the fluid collection similar to the positioning described on the CT scan.  Mild stable enlargement of the cardiac silhouette without pulmonary edema.  Evidence of previous granulomatous infection.   Electronically Signed   By: David   Martinique M.D.   On: 08/31/2014 09:19   Ct Chest Wo Contrast  08/30/2014   CLINICAL DATA:  Right pleural effusion and right-sided chest tube. Patient receiving intrapleural tPA.  EXAM: CT CHEST WITHOUT CONTRAST  TECHNIQUE: Multidetector CT imaging of the chest was performed following the standard protocol without IV contrast.  COMPARISON:  CT 08/25/2014 and chest x-ray 08/29/2014  FINDINGS: Right anterior lateral chest tube courses into the posterior pleural space at the level of the mid lungs as tip is within the most superior aspect of the pleural fluid collection. Minimal subcutaneous emphysema over the right flank. There is a moderate size right pleural effusion with mild interval improvement. There is continued compressive atelectasis over the right lower lobe  and right middle lobes. Small collection of air within the pleural space over the anterior aspect of the pleural effusion near the entry site at the chest tube. Calcified granuloma over the left lower lobe. Minimal scarring over the posterior left lower lobe. Airways are within normal.  Mild stable cardiomegaly. Calcification of the mitral valve annulus. Moderate calcification of the left main and 3 vessel coronary arteries unchanged. Coronary stents present. Tiny amount of pericardial fluid present. Mild-to-moderate calcified plaque over the thoracic aorta. No mediastinal, hilar or axillary adenopathy. Stable sub cm lymph node over the right retrocrural region at the level of the diaphragm.  Images through the upper abdomen are unchanged. There are degenerative changes of the spine.  IMPRESSION: Interval improvement in a moderate size right pleural fluid collection with associated compressive atelectasis over the right middle and lower lobes. Right-sided chest tube in with tip over the posterior pleural space in the most superior aspect of the pleural fluid collection.  Prior granulomas disease.  Atherosclerotic coronary artery disease with coronary  stents present. Mild calcification of the mitral valve annulus. Mild cardiomegaly.   Electronically Signed   By: Marin Olp M.D.   On: 08/30/2014 09:31    Scheduled Meds: . atorvastatin  10 mg Oral q1800  . budesonide-formoterol  2 puff Inhalation BID  . calcium carbonate  1 tablet Oral BID  . cefTAZidime (FORTAZ)  IV  2 g Intravenous Q12H  . diltiazem  240 mg Oral Daily  . feeding supplement (ENSURE ENLIVE)  237 mL Oral TID BM  . metoprolol tartrate  25 mg Oral TID  . montelukast  10 mg Oral QHS  . polyethylene glycol  17 g Oral Daily  . senna-docusate  1 tablet Oral BID  . tiotropium  18 mcg Inhalation Daily  . vancomycin  500 mg Intravenous Q12H   Continuous Infusions:    Principal Problem:   HCAP (healthcare-associated pneumonia) Active Problems:   Sepsis   CAD (coronary artery disease)   Atrial fibrillation   Chronic lymphatic leukemia   Pleural effusion on right   Pleural effusion   Pleural effusion, right   Blood poisoning   Chest tube in place   Pleural effusion associated with pulmonary infection   SOB (shortness of breath)    Time spent: 35 minutes    Kelvin Cellar  [facility] Pager [phone]. If 7PM-7AM, please contact night-coverage at [email or web address], password [facility] 08/31/2014, 5:06 PM  LOS: 6 days

## 2014-08-31 NOTE — Evaluation (Signed)
Occupational Therapy Evaluation Patient Details Name: Johnathan Strong MRN: [id number] DOB: [birth year] Today's Date: 08/31/2014    History of Present Illness 81 y.o. male with past history of COPD, paroxysmal A. fib on Eliquis, CAD, hypertension was recently discharged from [facility] 6/9 after treatment for community-acquired pneumonia. He presented to the ER where he was noted to have a white count of 24K, chest x-ray with worsening pneumonia and moderate right pleural effusion. Pt with chest tube.   Clinical Impression   Pt independent with ADLs, PTA. Feel pt will benefit from acute OT to increase independence and activity tolerance prior to d/c.     Follow Up Recommendations  Home health OT;Supervision/Assistance - 24 hour    Equipment Recommendations  3 in 1 bedside comode;Other (comment) (RW)    Recommendations for Other Services       Precautions / Restrictions Precautions Precautions: Fall Precaution Comments: chest tube Restrictions Weight Bearing Restrictions: No      Mobility Bed Mobility Overal bed mobility: Needs Assistance Bed Mobility: Sit to Supine       Sit to supine: Min assist   General bed mobility comments: assist with one LE. Assist to scoot [place] and cues given for technique.  Transfers Overall transfer level: Needs assistance   Transfers: Sit to/from Stand;Stand Pivot Transfers Sit to Stand: Mod assist Stand pivot transfers: Min guard       General transfer comment: used walker for stand pivot transfer. Assist to boost to stand.    Balance  Min guard for stand pivot transfer with RW. Balance not formally assessed.                                          ADL Overall ADL's : Needs assistance/impaired Eating/Feeding: Independent;Sitting                   Lower Body Dressing: Min guard;Sit to/from stand   Toilet Transfer: Min guard;Stand-pivot;RW (from chair to bed)           Functional mobility  during ADLs: Min guard;Rolling walker (stand pivot) General ADL Comments: Educated on energy conservation techniques. Pt SOB with little activity.      Vision     Perception     Praxis      Pertinent Vitals/Pain Pain Assessment: No/denies pain; pulse oximeter not reading well in session.     Hand Dominance     Extremity/Trunk Assessment Upper Extremity Assessment Upper Extremity Assessment: Overall WFL for tasks assessed   Lower Extremity Assessment Lower Extremity Assessment: Defer to PT evaluation       Communication Communication Communication: No difficulties   Cognition Arousal/Alertness: Awake/alert Behavior During Therapy: WFL for tasks assessed/performed Overall Cognitive Status: Within Functional Limits for tasks assessed                     General Comments       Exercises       Shoulder Instructions      Home Living Family/patient expects to be discharged to:: Private residence Living Arrangements: Spouse/significant other;Children Available Help at Discharge: Family;Available 24 hours/day Type of Home: House Home Access: Stairs to enter [employer] of Steps: 4 Entrance Stairs-Rails: Right;Left Home Layout: One level     Bathroom Shower/Tub: Teacher, early years/pre: Handicapped height (have both)     Home Equipment: Cane - single  point;Grab bars - tub/shower (per previous admission has cane)          Prior Functioning/Environment Level of Independence: Independent             OT Diagnosis: Generalized weakness   OT Problem List: Decreased strength;Cardiopulmonary status limiting activity;Decreased knowledge of use of DME or AE;Decreased knowledge of precautions;Decreased activity tolerance   OT Treatment/Interventions: Self-care/ADL training;Therapeutic exercise;Energy conservation;DME and/or AE instruction;Therapeutic activities;Patient/family education;Balance training    OT Goals(Current goals can  be found in the care plan section) Acute Rehab OT Goals Patient Stated Goal: not stated OT Goal Formulation: With patient Time For Goal Achievement: 09/07/14 Potential to Achieve Goals: Good ADL Goals Pt Will Perform Upper Body Bathing: with set-up;sitting;standing;with supervision Pt Will Perform Lower Body Bathing: with set-up;with supervision;sit to/from stand Pt Will Perform Lower Body Dressing: with set-up;with supervision;sit to/from stand Pt Will Transfer to Toilet: with supervision;ambulating (elevated toilet) Pt Will Perform Toileting - Clothing Manipulation and hygiene: sit to/from stand;with supervision Additional ADL Goal #1: Pt will independently verbalize 3/3 energy conservation techniques and utilize during session.  OT Frequency: Min 2X/week   Barriers to D/C:            Co-evaluation              End of Session Equipment Utilized During Treatment: Gait belt;Rolling walker Nurse Communication: Mobility status (pulse oximeter not reading well)  Activity Tolerance: Patient tolerated treatment well;Patient limited by fatigue Patient left: in bed;with call bell/phone within reach;with family/visitor present   Time: 2233-6122 (approxinately 4 minutes MD talking to pt/family) OT Time Calculation (min): 25 min Charges:  OT General Charges $OT Visit: 1 Procedure OT Evaluation $Initial OT Evaluation Tier I: 1 Procedure G-CodesBenito Mccreedy OTR/L [phone] 08/31/2014, 12:36 PM

## 2014-08-31 NOTE — Progress Notes (Signed)
  Subjective: Feels tired today  Objective: Vital signs in last 24 hours: Temp:  [98 F (36.7 C)-99.6 F (37.6 C)] 98.3 F (36.8 C) (06/27 0726) Pulse Rate:  [48-152] 98 (06/27 1029) Cardiac Rhythm:  [-] Atrial fibrillation (06/27 0825) Resp:  [16-18] 18 (06/27 1029) BP: (95-137)/(47-75) 114/67 mmHg (06/27 0725) SpO2:  [90 %-100 %] 94 % (06/27 1029) Weight:  [168 lb 4.8 oz (76.34 kg)] 168 lb 4.8 oz (76.34 kg) (06/27 0500)  Hemodynamic parameters for last 24 hours:    Intake/Output from previous day: 06/26 0701 - 06/27 0700 In: 2932.5 [P.O.:1320; I.V.:1462.5; IV Piggyback:150] Out: 775 [Urine:600; Chest Tube:175] Intake/Output this shift: Total I/O In: 360 [P.O.:360] Out: -   General appearance: alert and cooperative Heart: irregularly irregular rhythm Lungs: diminished breath sounds right base  Lab Results:  Recent Labs  08/30/14 0410 08/31/14 0320  WBC 16.1* 21.0*  HGB 10.1* 10.1*  HCT 30.2* 30.2*  PLT 235 231   BMET:  Recent Labs  08/30/14 0410 08/31/14 0320  NA 130* 129*  K 4.5 5.0  CL 98* 100*  CO2 24 21*  GLUCOSE 104* 110*  BUN 36* 29*  CREATININE 1.29* 1.24  CALCIUM 8.2* 7.9*    PT/INR: No results for input(s): LABPROT, INR in the last 72 hours. ABG    Component Value Date/Time   TCO2 28 10/19/2008 0850   CBG (last 3)  No results for input(s): GLUCAP in the last 72 hours.  Assessment/Plan: S/P   -  81 yo man with a right para-pneumonic effusion.   By CXR the effusion is unchanged with the last 2 doses of TPA after an initial good response with the first dose. By CT chest tube is just above the undrained fluid.  Options are to watch and see how he does or have a pigtail catheter placed. He would likely require additional thrombolytics if a pigtail is placed.  He continues to have atrial fib with a rapid VR.  Discussed with Pulmonary  I will be away the rest of the week. My partners will be happy to see him if there are any  questions we can answer.   LOS: 6 days    Melrose Nakayama 08/31/2014

## 2014-08-31 NOTE — Evaluation (Signed)
Clinical/Bedside Swallow Evaluation Patient Details  Name: Johnathan Strong MRN: [id number] Date of Birth: 1923-07-27  Today's Date: 08/31/2014 Time: SLP Start Time (ACUTE ONLY): 1520 SLP Stop Time (ACUTE ONLY): 1539 SLP Time Calculation (min) (ACUTE ONLY): 19 min  Past Medical History:  Past Medical History  Diagnosis Date  . Osteopenia 2006  . Hyperlipidemia   . BPH (benign prostatic hypertrophy)   . Transient global amnesia   . Osteoarthritis   . History of TIA (transient ischemic attack) 1980'S    NO RESIDUAL  . COPD (chronic obstructive pulmonary disease) with emphysema   . Chronic back pain   . Itching SECONDARY TO CLL-- CONTROLLED W/ SINGULAIR  . Frequency   . Nocturia   . Hypertension CARDIOLOGIST- DR BRACKBILL-- LAST VISIT 12-15-2010 NOTE IN EPIC  . CLL (chronic lymphocytic leukemia) LAST PLT COUNT APRL 2012  168    Managed at [place]  . Bladder cancer   . Aspirin allergy     Eyes swell  . Asthma   . Dyspnea on exertion   . CAD (coronary artery disease)     a. 08/2012 Cath: LM nl, LAD 90p, LCX 20-30, RCA 100ost fills via L->R collats, EF 55-65%;  b. 09/2012 PCI of prox LAD with 3.0x16 Promus DES.   Past Surgical History:  Past Surgical History  Procedure Laterality Date  . Excisional hemorrhoidectomy  1980  . Cardiovascular stress test  07/2005    a. 07/2005- no reversible ischemia, normal EF b. 06/2009- EF 73%, no reversible ischemia, inferolateral TWIs at rest, upright in recovery c. 08/2012- mediuim-sized partially reversible basal to mid inferior and inferoseptal perfusion defect c/w with prior infarction and peri-infarct ischemia; EF 68% and borderline ST/T changes on stress ECG   . Transurethral resection of bladder tumor  10-19-2008    AND DILATION URETHRAL STRICTURE  . Cataract extraction w/ intraocular lens  implant, bilateral Bilateral ~ 2007  . Transthoracic echocardiogram  01-03-2011    LVEF 64-33%, grade 1 diastolic dysfunction, no WMAs or  structural abnormalities  . Cystoscopy with biopsy  10/30/2011    Procedure: CYSTOSCOPY WITH BIOPSY;  Surgeon: Claybon Jabs, MD;  Location: [facility];  Service: Urology;  Laterality: N/A;  . Cardiac catheterization  08/27/2012  . Coronary angioplasty with stent placement  09/26/2012    "1" (09/26/2012)  . Coronary stent placement  09/26/12  . Left heart catheterization with coronary angiogram N/A 08/27/2012    Procedure: LEFT HEART CATHETERIZATION WITH CORONARY ANGIOGRAM;  Surgeon: Peter M Martinique, MD;  Location: [facility] CATH LAB;  Service: Cardiovascular;  Laterality: N/A;  . Percutaneous coronary stent intervention (pci-s) N/A 09/26/2012    Procedure: PERCUTANEOUS CORONARY STENT INTERVENTION (PCI-S);  Surgeon: Peter M Martinique, MD;  Location: [facility] CATH LAB;  Service: Cardiovascular;  Laterality: N/A;   HPI:  81 y.o. male with past history of COPD, paroxysmal A. fib on Eliquis, CAD, hypertension was recently discharged from [facility] 6/9 after treatment for community-acquired pneumonia. He presented to the ER where he was noted to have a white count of 24K, chest x-ray with worsening pneumonia and moderate right pleural effusion. Pt with chest tube.Previous admission for RLL PNA  with bedside swallow completed 6/9: notes WFL oropharyngeal swallow with suspected esophageal component.    Assessment / Plan / Recommendation Clinical Impression  As with previous bedside swallow evaluation,  oropharyngeal swallow appears WFL. Bolus manipulation and oral transit are swift, with no overt indicators for aspiration observed  across consistencies. Given report of globus with occassional coughing episodes, question esophageal component. Addtionally, patient reports right sided PNA 3 times in the past 6 months. Given re-admission for RLL PNA, plan for instrumental exam to r/o silent aspiration. Extensive education complete with patient and family regarding results of exam, rationale for MBS, and  recommendations. Will plan for [facility] 6/28.     Aspiration Risk  Mild    Diet Recommendation Age appropriate regular solids;Thin   Medication Administration: Whole meds with liquid Compensations: Slow rate;Small sips/bites;Follow solids with liquid    Other  Recommendations Oral Care Recommendations: Oral care BID         Pertinent Vitals/Pain n/a    Swallow Study Prior Functional Status  Type of Home: House Available Help at Discharge: Family;Available 24 hours/day    General Other Pertinent Information: 81 y.o. male with past history of COPD, paroxysmal A. fib on Eliquis, CAD, hypertension was recently discharged from [facility] 6/9 after treatment for community-acquired pneumonia. He presented to the ER where he was noted to have a white count of 24K, chest x-ray with worsening pneumonia and moderate right pleural effusion. Pt with chest tube.Previous admission for RLL PNA  with bedside swallow completed 6/9: notes WFL oropharyngeal swallow with suspected esophageal component.  Type of Study: Bedside swallow evaluation Previous Swallow Assessment: see HPI Diet Prior to this Study: Regular;Thin liquids Temperature Spikes Noted: No Respiratory Status: Room air History of Recent Intubation: No Behavior/Cognition: Alert;Cooperative;Pleasant mood Oral Cavity - Dentition: Adequate natural dentition/normal for age Self-Feeding Abilities: Able to feed self Patient Positioning: Upright in chair/Tumbleform Baseline Vocal Quality: Normal Volitional Cough: Strong Volitional Swallow: Able to elicit    Oral/Motor/Sensory Function Overall Oral Motor/Sensory Function: Appears within functional limits for tasks assessed   Ice Chips Ice chips: Not tested   Thin Liquid Thin Liquid: Within functional limits Presentation: Cup;Self Fed;Straw    Nectar Thick Nectar Thick Liquid: Not tested   Honey Thick Honey Thick Liquid: Not tested   Puree Puree: Within functional  limits Presentation: Self Fed;Spoon   Solid   GO   Trinady Milewski MA, CCC-SLP [phone]  Solid: Within functional limits Presentation: Self Fed       Maisa Bedingfield Meryl 08/31/2014,3:47 PM

## 2014-08-31 NOTE — Progress Notes (Addendum)
PULMONARY/CCM NOTE  Requesting MD/Service: August Saucer Date of admission: 6/21 Date of consult: 6/22 Reason for consultation:  parapneumonic effusion  Pt Profile:  58 M admitted to [facility] 6/04 - 6/09 with CAP. CXR on 6/09 revealed R effusion. He failed to recover as anticipated after discharge and readmitted 6/21 with large R effusion. Thoracentesis performed 6/22 revealed exudative characteristics (LDH 341, protein 3.7, with > 10,000 WBC, 93% neutrophils). Fluid was described as bloody. Pulm medicine asked to assist in further eval and mgmt of parapneumonic effusion   Significant tests/ events 6/23  A. fib RVR- transferred to stepdown unit 6/23 [place] of right chest - marked fibrinous tissue and concern for loculation t/o the right chest wall Rt chest tube 6/23 >>TPA 6/24: 1200 ml output, on 6/25 and 6/26 minimal output.   CT chest 6/26: Interval improvement in a moderate size right pleural fluid collection with associated compressive atelectasis over the right middle and lower lobes. Right-sided chest tube in with tip over the posterior pleural space in the most superior aspect of the pleural fluid collection.  Subjective- no sob currently   Filed Vitals:   08/31/14 0726 08/31/14 1029 08/31/14 1310 08/31/14 1312  BP:   120/41   Pulse:  98 98   Temp: 98.3 F (36.8 C)   98.4 F (36.9 C)  TempSrc: Oral   Oral  Resp:  18    Height:      Weight:      SpO2:  94% 94%   room air   EXAM:  Gen: WDWN, able to speak in full paragraphs, so him in IR HEENT:increaed while on side Neck: no LAN Lungs: no leak, slight coarse  Cardiovascular: IRIR, no M noted Abdomen: soft, NT, +BS Ext: no C/C/E Neuro: awake, alert  DATA:  BMET    Component Value Date/Time   NA 129* 08/31/2014 0320   NA 144 04/03/2014 1023   K 5.0 08/31/2014 0320   CL 100* 08/31/2014 0320   CO2 21* 08/31/2014 0320   GLUCOSE 110* 08/31/2014 0320   GLUCOSE 94 04/03/2014 1023   BUN 29* 08/31/2014 0320   BUN 16  04/03/2014 1023   CREATININE 1.24 08/31/2014 0320   CALCIUM 7.9* 08/31/2014 0320   GFRNONAA 50* 08/31/2014 0320   GFRAA 58* 08/31/2014 0320    CBC    Component Value Date/Time   WBC 21.0* 08/31/2014 0320   WBC 9.5 04/03/2014 1038   RBC 3.41* 08/31/2014 0320   RBC 4.2* 04/03/2014 1038   HGB 10.1* 08/31/2014 0320   HGB 12.0* 04/03/2014 1038   HCT 30.2* 08/31/2014 0320   HCT 40.0* 04/03/2014 1038   PLT 231 08/31/2014 0320   MCV 88.6 08/31/2014 0320   MCV 95.4 04/03/2014 1038   MCH 29.6 08/31/2014 0320   MCH 28.6 04/03/2014 1038   MCHC 33.4 08/31/2014 0320   MCHC 30.0* 04/03/2014 1038   RDW 22.5* 08/31/2014 0320   LYMPHSABS 5.6* 08/25/2014 0700   MONOABS 1.0 08/25/2014 0700   EOSABS 0.0 08/25/2014 0700   BASOSABS 0.0 08/25/2014 0700    Chest tube- minimal  CT scan 6/26- images reviewed pending Radiology report. There is residual loculation. Tube placement ok.   IMPRESSION:   1) HCAP (healthcare-associated pneumonia) 2) Chronic lymphatic leukemia 3) R  effusion  parapneumonic, complex. First TPA yielded remarkable results, the next two have not been as successful.  We can expect residual rind; but does have significant posterior loculated fluid collection w/ atelectasis. Spoke w/ thoracics. Agree that it  would be worth a shot to have IR place CT guided pig-tail.   PLAN:   In CT now for pigtail currently Will follow output and analysis, likely will be similar to other analysis effsion Follow output of 32 french effusion, in am  evaluate post pigtail pcxr for improved aeration, concern was significant compressive atelectasis from residual posterior pocket Remains cuture neg, dc vanc Also given WBC, for pigtail then evaluate trend and culture of this pocket Remains with hyponatremia, evaluate volume status, UA, urine na, osm, then decide lasix dosing? Cards evaluation noted i called during CT procedure to let family know was being performed  Lavon Paganini. Titus Mould, MD,  [place] Pgr: [facility]

## 2014-08-31 NOTE — Progress Notes (Signed)
Patient ID: Johnathan Strong, male   DOB: 04/05/1923, 81 y.o.   MRN: [id number]   IR aware of R loculated pleural effusion pigtail cath request  In review with IR MD  PAS will see pt in am

## 2014-08-31 NOTE — Evaluation (Signed)
Physical Therapy Evaluation Patient Details Name: Johnathan Strong MRN: [id number] DOB: 1923-07-20 Today's Date: 08/31/2014   History of Present Illness  81 y.o. male with past history of COPD, paroxysmal A. fib on Eliquis, CAD, hypertension was recently discharged from [facility] 6/9 after treatment for community-acquired pneumonia. He presented to the ER where he was noted to have a white count of 24K, chest x-ray with worsening pneumonia and moderate right pleural effusion. Pt with chest tube.  Clinical Impression  Pt admitted with above diagnosis. Pt currently with functional limitations due to the deficits listed below (see PT Problem List). Pt moving well, however limited by elevated HR with activity (156 max). Returned to 110's after seated 60 seconds. SaO2 97% on room air (once it registered after 60 second rest). Pt will benefit from skilled PT to increase their independence and safety with mobility to allow discharge to the venue listed below.       Follow Up Recommendations Home health PT;Supervision for mobility/OOB    Equipment Recommendations  Rolling walker with 5" wheels    Recommendations for Other Services       Precautions / Restrictions Precautions Precautions: Fall Precaution Comments: chest tube Restrictions Weight Bearing Restrictions: No      Mobility  Bed Mobility Overal bed mobility: Needs Assistance Bed Mobility: Supine to Sit     Supine to sit: Min guard;HOB elevated Sit to supine: Min assist   General bed mobility comments: HOB elevated due to need to exit towards chest tube (to avoid rolling on CT); +use of rail  Transfers Overall transfer level: Needs assistance Equipment used: Rolling walker (2 wheeled) Transfers: Sit to/from Stand Sit to Stand: Min guard Stand pivot transfers: Min guard       General transfer comment: vc for proper use of RW; able to power up to stand from EOB (lowest height) without  assist  Ambulation/Gait Ambulation/Gait assistance: Min guard Ambulation Distance (Feet): 80 Feet Assistive device: Rolling walker (2 wheeled) Gait Pattern/deviations: Step-through pattern;Decreased stride length     General Gait Details: encouraged to decr speed due to elevated HR  Stairs            Wheelchair Mobility    Modified Rankin (Stroke Patients Only)       Balance Overall balance assessment: No apparent balance deficits (not formally assessed)                                           Pertinent Vitals/Pain Pain Assessment: No/denies pain    Home Living Family/patient expects to be discharged to:: Private residence Living Arrangements: Spouse/significant other (son confirmed just pt and spouse at home) Available Help at Discharge: Family;Available 24 hours/day Type of Home: House Home Access: Stairs to enter Entrance Stairs-Rails: Psychiatric nurse of Steps: 4 Home Layout: One level Home Equipment: Cane - single point;Grab bars - tub/shower (per previous admission has cane)      Prior Function Level of Independence: Independent               Hand Dominance        Extremity/Trunk Assessment   Upper Extremity Assessment: Defer to OT evaluation           Lower Extremity Assessment: Overall WFL for tasks assessed      Cervical / Trunk Assessment: Other exceptions  Communication   Communication: No difficulties  Cognition  Arousal/Alertness: Awake/alert (after awakening and sit EOB) Behavior During Therapy: WFL for tasks assessed/performed Overall Cognitive Status: Within Functional Limits for tasks assessed                      General Comments      Exercises General Exercises - Lower Extremity Ankle Circles/Pumps: AROM;Both;10 reps Heel Slides: AROM;Both;Other reps (comment) (3)      Assessment/Plan    PT Assessment Patient needs continued PT services  PT Diagnosis Difficulty  walking   PT Problem List Decreased activity tolerance;Decreased mobility;Decreased knowledge of use of DME;Cardiopulmonary status limiting activity  PT Treatment Interventions DME instruction;Gait training;Stair training;Functional mobility training;Therapeutic activities;Therapeutic exercise;Patient/family education   PT Goals (Current goals can be found in the Care Plan section) Acute Rehab PT Goals Patient Stated Goal: get straightened out PT Goal Formulation: With patient Time For Goal Achievement: 09/07/14 Potential to Achieve Goals: Good    Frequency Min 3X/week   Barriers to discharge        Co-evaluation               End of Session Equipment Utilized During Treatment: Gait belt Activity Tolerance: Treatment limited secondary to medical complications (Comment) (elevated HR) Patient left: in chair;with call bell/phone within reach;with nursing/sitter in room;with family/visitor present Nurse Communication: Mobility status         Time: 6389-3734 PT Time Calculation (min) (ACUTE ONLY): 31 min   Charges:   PT Evaluation $Initial PT Evaluation Tier I: 1 Procedure PT Treatments $Gait Training: 8-22 mins   PT G Codes:        Kholton Coate 28-Sep-2014, 2:01 PM Pager [phone]

## 2014-08-31 NOTE — Progress Notes (Signed)
[place] for Vancomycin, Ceftazidime  Indication: HCAP   Assessment: 51 YOM who presented with SOB and chest pain along with chills and fevers. Now s/p CT with 2 trials of tPa to help drain CT. Probably lung abscess  ID: HCAP/sepsis and likely empyema of the RIGHT lung on day #7 abx Afebrile, WBC up to 21, AF  [place] 6/21> Vancomycin 6/21> 6/24 VT = 21>>dose decreased to 750 q12h>>500 q12h  6/27 VT = 15>>prior dose given early, so probably slightly lower than true trough  BC x 2 6/21>ngtd Pleural fluid 6/22> ngtd  Goal of Therapy:  Vancomycin trough level 15-20 mcg/ml  Plan:  -Continue vancomycin at 500mg  IV q12, Fortaz 2 grams iv Q 12 -F/U LOT, VT prn  Levester Fresh, PharmD, BCPS Clinical Pharmacist Pager [phone] 08/31/2014 2:50 PM  Labs:  Recent Labs  08/29/14 0600 08/30/14 0410 08/31/14 0320  WBC 17.6* 16.1* 21.0*  HGB 10.7* 10.1* 10.1*  PLT 239 235 231  CREATININE 1.14 1.29* 1.24   Estimated Creatinine Clearance: 39.1 mL/min (by C-G formula based on Cr of 1.24).  Recent Labs  08/28/14 2143 08/31/14 1348  [place] 21* 15     Microbiology: Recent Results (from the past 720 hour(s))  Culture, blood (routine x 2) Call MD if unable to obtain prior to antibiotics being given     Status: None   Collection Time: 08/09/14  9:33 PM  Result Value Ref Range Status   Specimen Description BLOOD RIGHT ANTECUBITAL  Final   Special Requests   Final    BOTTLES DRAWN AEROBIC AND ANAEROBIC [place] BLUE [place]   Culture   Final    NO GROWTH 5 DAYS Performed at [facility]    Report Status 08/16/2014 FINAL  Final  Culture, blood (routine x 2) Call MD if unable to obtain prior to antibiotics being given     Status: None   Collection Time: 08/09/14  9:45 PM  Result Value Ref Range Status   Specimen Description BLOOD RIGHT ANTECUBITAL  Final   Special Requests BOTTLES DRAWN AEROBIC AND ANAEROBIC 5CC  Final   Culture   Final     NO GROWTH 5 DAYS Performed at [facility]    Report Status 08/16/2014 FINAL  Final  Culture, blood (routine x 2)     Status: None   Collection Time: 08/25/14  7:00 AM  Result Value Ref Range Status   Specimen Description BLOOD RIGHT ANTECUBITAL  Final   Special Requests BAA 10CC  Final   Culture NO GROWTH 5 DAYS  Final   Report Status 08/30/2014 FINAL  Final  Culture, blood (routine x 2)     Status: None   Collection Time: 08/25/14  7:29 AM  Result Value Ref Range Status   Specimen Description BLOOD RIGHT ANTECUBITAL  Final   Special Requests BOTTLES DRAWN AEROBIC AND ANAEROBIC 5CC  Final   Culture NO GROWTH 5 DAYS  Final   Report Status 08/30/2014 FINAL  Final  Culture, body fluid-bottle     Status: None (Preliminary result)   Collection Time: 08/26/14 11:09 AM  Result Value Ref Range Status   Specimen Description FLUID RIGHT PLEURAL  Final   Special Requests NONE  Final   Culture NO GROWTH 4 DAYS  Final   Report Status PENDING  Incomplete  Gram stain     Status: None   Collection Time: 08/26/14 11:09 AM  Result Value Ref Range Status   Specimen Description FLUID RIGHT  PLEURAL  Final   Special Requests NONE  Final   Gram Stain   Final    ABUNDANT WBC PRESENT, PREDOMINANTLY PMN NO ORGANISMS SEEN    Report Status 08/26/2014 FINAL  Final

## 2014-09-01 ENCOUNTER — Inpatient Hospital Stay (HOSPITAL_COMMUNITY): Payer: Medicare Other

## 2014-09-01 ENCOUNTER — Encounter (HOSPITAL_COMMUNITY): Payer: Self-pay | Admitting: Physician Assistant

## 2014-09-01 DIAGNOSIS — J90 Pleural effusion, not elsewhere classified: Secondary | ICD-10-CM

## 2014-09-01 DIAGNOSIS — I2510 Atherosclerotic heart disease of native coronary artery without angina pectoris: Secondary | ICD-10-CM

## 2014-09-01 LAB — URINE MICROSCOPIC-ADD ON

## 2014-09-01 LAB — URINALYSIS, ROUTINE W REFLEX MICROSCOPIC
Bilirubin Urine: NEGATIVE
Glucose, UA: NEGATIVE mg/dL
Ketones, ur: NEGATIVE mg/dL
LEUKOCYTES UA: NEGATIVE
NITRITE: NEGATIVE
Protein, ur: 30 mg/dL — AB
SPECIFIC GRAVITY, URINE: 1.02 (ref 1.005–1.030)
Urobilinogen, UA: 0.2 mg/dL (ref 0.0–1.0)
pH: 5 (ref 5.0–8.0)

## 2014-09-01 LAB — BASIC METABOLIC PANEL
Anion gap: 5 (ref 5–15)
BUN: 30 mg/dL — AB (ref 6–20)
CO2: 23 mmol/L (ref 22–32)
Calcium: 7.9 mg/dL — ABNORMAL LOW (ref 8.9–10.3)
Chloride: 100 mmol/L — ABNORMAL LOW (ref 101–111)
Creatinine, Ser: 1.12 mg/dL (ref 0.61–1.24)
GFR calc Af Amer: >60 mL/min (ref >60)
GFR, EST NON AFRICAN AMERICAN: 56 mL/min — AB (ref >60)
Glucose, Bld: 113 mg/dL — ABNORMAL HIGH (ref 65–99)
POTASSIUM: 4.7 mmol/L (ref 3.5–5.1)
SODIUM: 128 mmol/L — AB (ref 135–145)

## 2014-09-01 LAB — CBC
HEMATOCRIT: 27.9 % — AB (ref 39.0–52.0)
HEMOGLOBIN: 9.4 g/dL — AB (ref 13.0–17.0)
MCH: 29.4 pg (ref 26.0–34.0)
MCHC: 33.7 g/dL (ref 30.0–36.0)
MCV: 87.2 fL (ref 78.0–100.0)
Platelets: 246 10*3/uL (ref 150–400)
RBC: 3.2 MIL/uL — ABNORMAL LOW (ref 4.22–5.81)
RDW: 22.7 % — ABNORMAL HIGH (ref 11.5–15.5)
WBC: 20.1 10*3/uL — ABNORMAL HIGH (ref 4.0–10.5)

## 2014-09-01 LAB — SODIUM, URINE, RANDOM: Sodium, Ur: 10 mmol/L

## 2014-09-01 MED ORDER — METOPROLOL TARTRATE 1 MG/ML IV SOLN
2.5000 mg | Freq: Four times a day (QID) | INTRAVENOUS | Status: DC | PRN
  Administered 2014-09-01 – 2014-09-13 (×8): 2.5 mg via INTRAVENOUS
  Filled 2014-09-01 (×7): qty 5

## 2014-09-01 MED ORDER — FENTANYL CITRATE (PF) 100 MCG/2ML IJ SOLN
INTRAMUSCULAR | Status: AC | PRN
  Administered 2014-09-01: 25 ug via INTRAVENOUS

## 2014-09-01 MED ORDER — METOPROLOL TARTRATE 12.5 MG HALF TABLET
12.5000 mg | ORAL_TABLET | ORAL | Status: AC
  Administered 2014-09-01: 12.5 mg via ORAL
  Filled 2014-09-01: qty 1

## 2014-09-01 MED ORDER — SODIUM CHLORIDE 0.9 % IV SOLN
INTRAVENOUS | Status: DC
  Administered 2014-09-01: 10:00:00 via INTRAVENOUS

## 2014-09-01 MED ORDER — AMIODARONE LOAD VIA INFUSION: 150.0000 mg | Freq: Once | INTRAVENOUS | Status: DC

## 2014-09-01 MED ORDER — FENTANYL CITRATE (PF) 100 MCG/2ML IJ SOLN
INTRAMUSCULAR | Status: AC
  Filled 2014-09-01: qty 2

## 2014-09-01 MED ORDER — MIDAZOLAM HCL 2 MG/2ML IJ SOLN
INTRAMUSCULAR | Status: AC | PRN
  Administered 2014-09-01: 0.5 mg via INTRAVENOUS

## 2014-09-01 MED ORDER — MIDAZOLAM HCL 2 MG/2ML IJ SOLN
INTRAMUSCULAR | Status: AC
Start: 2014-09-01 — End: 2014-09-01
  Filled 2014-09-01: qty 2

## 2014-09-01 MED ORDER — AMIODARONE HCL IN DEXTROSE 360-4.14 MG/200ML-% IV SOLN: 30.0000 mg/h | INTRAVENOUS | Status: DC

## 2014-09-01 MED ORDER — AMIODARONE HCL IN DEXTROSE 360-4.14 MG/200ML-% IV SOLN: 60.0000 mg/h | INTRAVENOUS | Status: DC

## 2014-09-01 MED ORDER — LIDOCAINE-EPINEPHRINE 1 %-1:100000 IJ SOLN
INTRAMUSCULAR | Status: AC
  Filled 2014-09-01: qty 1

## 2014-09-01 NOTE — Progress Notes (Signed)
[facility] PROGRESS NOTE  Johnathan Strong STM:[id number] DOB: 09/05/1923 DOA: 08/25/2014 PCP: Redge Gainer, MD   Interim Summary Patient is a pleasant 81 year old gentleman with a past medical history of CML, recently discharged from the medicine service on 08/19/2014, admitted on 08/09/2014 during which he was treated for community-acquired pneumonia. Patient showed gradual improvement as he was weaned off of supplemental oxygen, tolerating by mouth intake, and was discharged on Ceftin. His wife reporting that patient did well the week after discharge, got back to his normal routine, then had a decline this past Monday, developing fevers, chills, night sweats. Chest x-ray performed on admission revealed moderate right pleural effusion with associated right lung base consolidation/collapse. He was started on broad-spectrum IV antibiotic therapy with vancomycin and ceftazidime, undergoing ultrasound-guided thoracentesis by interventional radiology on 08/26/2014. Pulmonary critical care medicine was consulted regarding pleural effusion as it was felt he would benefit chest tube. PCCM placed a 32 French chest tube on 08/27/2014 (rather than wayne cath) since there was marked fibrinous tissue and concern for loculation. Subsequent  imagining showed incomplete drainage for which TPA was injected into pleural space. This seemed to help initially however subsequent injections did not yield significant fluid. PCCM recommended proceeding with IR consult for PIG-tail catheter, which was performed on 09/01/2014.  Hospitalization was complicated by the moment of A. fib with RVR on 08/27/2014 likely precipitated by underlying pulmonary infectious process and acute hypoxemic respiratory failure. Controlling Afib has been a challenge. I consulted cardiology on 09/01/2014.   Assessment/Plan: 1. Probable healthcare associated pneumonia -Patient recently hospitalized discharged on 08/19/2014 in which time was treated  for community acquired pneumonia, now presenting with a probable parapneumonic effusion. He underwent ultrasound-guided thoracentesis today that was performed by interventional radiology where a liter of yellow colored fluid was removed, cell count showing white count of 10,299. There were no organisms seen on Gram stain.  -Pulmonary critical care medicine was consulted. Treatment options were discussed with patient and family members including placement of drain catheter versus repeat thoracentesis. Ultimately a VATS procedure may not be well tolerated by him given multiple comorbidities, advanced age along with issues with A. fib with RVR. -Continue broad-spectrum IV and microbial therapy with vancomycin and ceftazidime -Pleural fluid culture showing no growth to date. -Pulmonary medicine givingTPA injections into pleural space given the amount of fibrous material, and incomplete fluid drainage from chest tube. Now having increasing drainage from chest tube.  -Patient having a repeat chest CT which showed interval improvement in right-sided pleural effusion. Patient was seen and evaluated by Dr. Roxan Hockey of cardiothoracic surgery who did not recommend VATS -White count trending down to 16,100, from 34,000 on 08/26/2014 -Repeat chest x-ray on 08/31/2014 showing persistent moderate size inferior right pleural effusion. There is concern about the potential complications of having a complete drainage of pleural fluid including ongoing compressive atelectasis and infection. -Pulmonary critical care medicine recommending interventional radiology consultation for pigtail catheter placement done on 09/01/2014  2.  Acute hypoxemic respiratory failure -Evidenced by a respiratory rate of 33 with patient having an oxygen requirement of 4 L of supplemental oxygen via nasal cannula -Likely secondary to development of healthcare associated pneumonia and pleural effusion  3.  Atrial fibrillation with rapid  ventricular response -This morning he was found to be in A. fib having ventricular rates in the 120s to 140s -On his previous hospitalization he had issues with A. fib with RVR. I suspect underlying infectious pulmonary process and acute hypoxemic respiratory failure likely precipitating  Afib w RVR -He continues to have episodes of Afib w RVR, was given 5 mg of IV lopressor   -Will continue Cardizem 240 mg PO q daily along with Metoprolol 25 mg PO TID -Afib difficult to control, now getting a little hypotensive, on 2 nodal agents. Cardiology consulted  4.  Sepsis -Present on admission, evidenced by a white count of 24,200, respiratory rate of 33, heart rate of 107, with source of infection likely healthcare associated pneumonia -Follow-up on blood cultures and pleural fluid cultures -Continue broad-spectrum empiric IV antibiotic therapy with vancomycin and ceftaz   5.  Hypertension -Because of A. fib with RVR I have increase Cardizem to 240 mg by mouth daily from 120 mg and metoprolol to 25 mg by mouth 3 times a day from BID dosing. With these changes in nodal agents, Cozaar was stopped for now, will monitor heart rates and blood pressures. -Patient hypotensive today, on two nodal agents cardiology consulted.   6.  Acute kidney injury -Kidney function slightly improved  -AM labs showing Cr of 1.12  7.  Coronary artery disease -Status post percutaneous intervention to LAD on 09/26/2012, Plavix held  Code Status: DO NOT RESUSCITATE Family Communication: I spoke with his wife and daughter who were present at bedside Disposition Plan: Anticipate patient will be discharged home when medically stable   Consultants:  Pulmonary critical care medicine  Procedures:  Ultrasound-guided thoracentesis performed on 08/26/2014  Antibiotics:  Vancomycin  Ceftaz   HPI/Subjective: Patient undergoing PIGTail catheter placement today by IR, he states feeling better, out of bed to chair.  Tolerating PO, Afib remains uncontrolled.   Objective: Filed Vitals:   09/01/14 1237  BP: 105/57  Pulse: 141  Temp:   Resp: 29    Intake/Output Summary (Last 24 hours) at 09/01/14 1536 Last data filed at 09/01/14 0605  Gross per 24 hour  Intake    240 ml  Output    400 ml  Net   -160 ml   Filed Weights   08/30/14 0400 08/31/14 0500 09/01/14 0445  Weight: 74.322 kg (163 lb 13.6 oz) 76.34 kg (168 lb 4.8 oz) 77.61 kg (171 lb 1.6 oz)    Exam:   General: Patient looks a little better today overall. Was assisted out of bed to chair. Patient states breathing easier.   Cardiovascular: Irregular rate and rhythm, tachycardic  Respiratory: He has better lung sounds at right lung, CT in place  Abdomen: Soft nontender nondistended  Musculoskeletal: No edema  Data Reviewed: Basic Metabolic Panel:  Recent Labs Lab 08/28/14 0454 08/29/14 0600 08/30/14 0410 08/31/14 0320 09/01/14 0517  NA 133* 130* 130* 129* 128*  K 4.0 4.2 4.5 5.0 4.7  CL 97* 98* 98* 100* 100*  CO2 27 25 24  21* 23  GLUCOSE 118* 116* 104* 110* 113*  BUN 23* 30* 36* 29* 30*  CREATININE 1.12 1.14 1.29* 1.24 1.12  CALCIUM 8.5* 8.5* 8.2* 7.9* 7.9*   Liver Function Tests:  Recent Labs Lab 08/26/14 0448  AST 13*  ALT 14*  ALKPHOS 66  BILITOT 1.4*  PROT 5.8*  ALBUMIN 3.2*   No results for input(s): LIPASE, AMYLASE in the last 168 hours. No results for input(s): AMMONIA in the last 168 hours. CBC:  Recent Labs Lab 08/28/14 0454 08/29/14 0600 08/30/14 0410 08/31/14 0320 09/01/14 0517  WBC 23.1* 17.6* 16.1* 21.0* 20.1*  HGB 12.0* 10.7* 10.1* 10.1* 9.4*  HCT 36.0* 32.1* 30.2* 30.2* 27.9*  MCV 90.5 89.9 87.8 88.6 87.2  PLT  243 239 235 231 246   Cardiac Enzymes: No results for input(s): CKTOTAL, CKMB, CKMBINDEX, TROPONINI in the last 168 hours. BNP (last 3 results)  Recent Labs  04/21/14 1541 08/25/14 0700  BNP 484.0* 224.5*    ProBNP (last 3 results) No results for input(s): PROBNP  in the last 8760 hours.  CBG: No results for input(s): GLUCAP in the last 168 hours.  Recent Results (from the past 240 hour(s))  Culture, blood (routine x 2)     Status: None   Collection Time: 08/25/14  7:00 AM  Result Value Ref Range Status   Specimen Description BLOOD RIGHT ANTECUBITAL  Final   Special Requests BAA 10CC  Final   Culture NO GROWTH 5 DAYS  Final   Report Status 08/30/2014 FINAL  Final  Culture, blood (routine x 2)     Status: None   Collection Time: 08/25/14  7:29 AM  Result Value Ref Range Status   Specimen Description BLOOD RIGHT ANTECUBITAL  Final   Special Requests BOTTLES DRAWN AEROBIC AND ANAEROBIC 5CC  Final   Culture NO GROWTH 5 DAYS  Final   Report Status 08/30/2014 FINAL  Final  Culture, body fluid-bottle     Status: None   Collection Time: 08/26/14 11:09 AM  Result Value Ref Range Status   Specimen Description FLUID RIGHT PLEURAL  Final   Special Requests NONE  Final   Culture NO GROWTH 5 DAYS  Final   Report Status 08/31/2014 FINAL  Final  Gram stain     Status: None   Collection Time: 08/26/14 11:09 AM  Result Value Ref Range Status   Specimen Description FLUID RIGHT PLEURAL  Final   Special Requests NONE  Final   Gram Stain   Final    ABUNDANT WBC PRESENT, PREDOMINANTLY PMN NO ORGANISMS SEEN    Report Status 08/26/2014 FINAL  Final     Studies: Dg Chest 2 View  08/31/2014   CLINICAL DATA:  Right pleural effusion with chest tube drainage, shortness of breath.  EXAM: CHEST  2 VIEW  COMPARISON:  Chest x-ray of June Jul 29, 2014 and chest CT scan of August 30, 2014.  FINDINGS: On the right there is mild volume loss and a moderate-sized pleural effusion. The right chest tube tip projects over the posterior aspect of the seventh rib and is unchanged. There is atelectasis or pneumonia at the right lung base which is stable. The left lung is well-expanded. There is a stable approximately 11 mm calcified nodule in the left lower lobe. There is no  mediastinal shift. The cardiac silhouette is mildly enlarged. The pulmonary vascularity is normal. The bony thorax exhibits no acute abnormality.  IMPRESSION: Persistent moderate-sized inferior right pleural effusion. The tip of the right chest tube lies along the superior aspect of the fluid collection similar to the positioning described on the CT scan.  Mild stable enlargement of the cardiac silhouette without pulmonary edema.  Evidence of previous granulomatous infection.   Electronically Signed   By: David  Martinique M.D.   On: 08/31/2014 09:19   Dg Swallowing Func-speech Pathology  09/01/2014    Objective Swallowing Evaluation:    Patient Details  Name: Johnathan Strong MRN: [id number] Date of Birth: Aug 22, 1923  Today's Date: 09/01/2014 Time: SLP Start Time (ACUTE ONLY): 1245-SLP Stop Time (ACUTE ONLY): 1320 SLP Time Calculation (min) (ACUTE ONLY): 35 min  Past Medical History:  Past Medical History  Diagnosis Date  . Osteopenia 2006  . Hyperlipidemia   .  BPH (benign prostatic hypertrophy)   . Transient global amnesia   . Osteoarthritis   . History of TIA (transient ischemic attack) 1980'S    NO RESIDUAL  . COPD (chronic obstructive pulmonary disease) with emphysema   . Chronic back pain   . Itching SECONDARY TO CLL-- CONTROLLED W/ SINGULAIR  . Frequency   . Nocturia   . Hypertension CARDIOLOGIST- DR BRACKBILL-- LAST VISIT 12-15-2010 NOTE IN  EPIC  . CLL (chronic lymphocytic leukemia) LAST PLT COUNT APRL 2012  168    Managed at [place]  . Bladder cancer   . Aspirin allergy     Eyes swell  . Asthma   . Dyspnea on exertion   . CAD (coronary artery disease)     a. 08/2012 Cath: LM nl, LAD 90p, LCX 20-30, RCA 100ost fills via L->R  collats, EF 55-65%;  b. 09/2012 PCI of prox LAD with 3.0x16 Promus DES.   Past Surgical History:  Past Surgical History  Procedure Laterality Date  . Excisional hemorrhoidectomy  1980  . Cardiovascular stress test  07/2005    a. 07/2005- no reversible ischemia, normal EF b.  06/2009- EF 73%, no  reversible ischemia, inferolateral TWIs at rest, upright in recovery c.  08/2012- mediuim-sized partially reversible basal to mid inferior and  inferoseptal perfusion defect c/w with prior infarction and peri-infarct  ischemia; EF 68% and borderline ST/T changes on stress ECG   . Transurethral resection of bladder tumor  10-19-2008    AND DILATION URETHRAL STRICTURE  . Cataract extraction w/ intraocular lens  implant, bilateral Bilateral ~  2007  . Transthoracic echocardiogram  01-03-2011    LVEF 64-33%, grade 1 diastolic dysfunction, no WMAs or structural  abnormalities  . Cystoscopy with biopsy  10/30/2011    Procedure: CYSTOSCOPY WITH BIOPSY;  Surgeon: Claybon Jabs, MD;   Location: [facility];  Service: Urology;  Laterality: N/A;   . Cardiac catheterization  08/27/2012  . Coronary angioplasty with stent placement  09/26/2012    "1" (09/26/2012)  . Coronary stent placement  09/26/12  . Left heart catheterization with coronary angiogram N/A 08/27/2012    Procedure: LEFT HEART CATHETERIZATION WITH CORONARY ANGIOGRAM;  Surgeon:  Peter M Martinique, MD;  Location: [facility] CATH LAB;  Service: Cardiovascular;   Laterality: N/A;  . Percutaneous coronary stent intervention (pci-s) N/A 09/26/2012    Procedure: PERCUTANEOUS CORONARY STENT INTERVENTION (PCI-S);  Surgeon:  Peter M Martinique, MD;  Location: [facility] CATH LAB;  Service: Cardiovascular;   Laterality: N/A;   HPI:  Other Pertinent Information: 81 y.o. male with past history of COPD,  paroxysmal A. fib on Eliquis, CAD, hypertension was recently discharged  from [facility] 6/9 after treatment for community-acquired  pneumonia. He presented to the ER where he was noted to have a white count  of 24K, chest x-ray with worsening pneumonia and moderate right pleural  effusion. Pt with chest tube.Previous admission for RLL PNA  with bedside  swallow completed 6/9: notes WFL oropharyngeal swallow with suspected  esophageal component.   No Data  Recorded  Assessment / Plan / Recommendation CHL IP CLINICAL IMPRESSIONS 09/01/2014  Therapy Diagnosis Mild oral phase dysphagia;Suspected primary esophageal  dysphagia  Clinical Impression Pt demonstrates very mild oral dysphagia warranting  basic precautions to reduce risk of aspiration. The pt has trace premature  spillage and trace oral residuals after the swallow, both of which spill  to the valleculae and pyriforms prior to the swallow  with 1-2 instances of  trace penetration, expelled with a swallow. Oropharyngeal function is  otherwise WNL. An esophageal sweep does reveal stasis throughout the  distal to mid esophagus with upward movement of residual. The barium  tablet remained at the GE junction at the end of the test. Pt is  recommended to follow precautions to reduce risk of aspiration including  upright positioning, a second swallow and staying upright 30-60 minutes  after meal. Despite the precautions the pts highest risk is aspiration of  esophageal stasis after meals. Suggest f/u esophageal testing. SLP will  f/u for reinforcement of precautions.       CHL IP TREATMENT RECOMMENDATION 09/01/2014  Treatment Recommendations Therapy as outlined in treatment plan below     CHL IP DIET RECOMMENDATION 09/01/2014  SLP Diet Recommendations Age appropriate regular solids;Thin  Liquid Administration via (None)  Medication Administration Whole meds with liquid  Compensations Small sips/bites  Postural Changes and/or Swallow Maneuvers (None)     CHL IP OTHER RECOMMENDATIONS 09/01/2014  Recommended Consults Consider esophageal assessment;Consider GI evaluation   Oral Care Recommendations Oral care BID  Other Recommendations (None)     No flowsheet data found.   CHL IP FREQUENCY AND DURATION 09/01/2014  Speech Therapy Frequency (ACUTE ONLY) min 2x/week  Treatment Duration 1 week       No flowsheet data found.  No flowsheet data found.        Herbie [place], [place] CCC-SLP [phone]  Lynann Beaver 09/01/2014, 2:57  PM     Scheduled Meds: . atorvastatin  10 mg Oral q1800  . budesonide-formoterol  2 puff Inhalation BID  . calcium carbonate  1 tablet Oral BID  . cefTAZidime (FORTAZ)  IV  2 g Intravenous Q12H  . diltiazem  240 mg Oral Daily  . feeding supplement (ENSURE ENLIVE)  237 mL Oral TID BM  . fentaNYL      . lidocaine-EPINEPHrine      . metoprolol tartrate  25 mg Oral TID  . midazolam      . montelukast  10 mg Oral QHS  . polyethylene glycol  17 g Oral Daily  . senna-docusate  1 tablet Oral BID  . tiotropium  18 mcg Inhalation Daily   Continuous Infusions: . sodium chloride 10 mL/hr at 09/01/14 1204    Principal Problem:   HCAP (healthcare-associated pneumonia) Active Problems:   Sepsis   CAD (coronary artery disease)   Atrial fibrillation   Chronic lymphatic leukemia   Pleural effusion on right   Pleural effusion   Pleural effusion, right   Blood poisoning   Chest tube in place   Pleural effusion associated with pulmonary infection   SOB (shortness of breath)    Time spent: 30 minutes    Kelvin Cellar  [facility] Pager [phone]. If 7PM-7AM, please contact night-coverage at [email or web address], password [facility] 09/01/2014, 3:36 PM  LOS: 7 days

## 2014-09-01 NOTE — Progress Notes (Signed)
PT Cancellation Note  Patient Details Name: Johnathan Strong MRN: [id number] DOB: Feb 19, 1924   Cancelled Treatment:    Reason Eval/Treat Not Completed: 1st attempt, HR elevated upper 140s at rest and nursing requesting to give meds first for HR.  Returned ~ 1 hr later andpt being wheeled off floor. Patient at procedure or test/unavailable.  Will check back tomorrow.   Lydell Moga LUBECK 09/01/2014, 11:53 AM

## 2014-09-01 NOTE — Procedures (Signed)
Technically successful CT guided drainage catheter placement into caudal aspect of the right pleural space yielding 30 cc of slightly bloody appearing pleural fluid.  Samples sent to laboratory.  No immediate post procedural complications.

## 2014-09-01 NOTE — Progress Notes (Signed)
MBSS complete. Full report located under chart review in imaging section. Robynne Roat, MA CCC-SLP [phone]  

## 2014-09-01 NOTE — Progress Notes (Signed)
HR remains 120s, notified Baltazar Najjar NP.

## 2014-09-01 NOTE — Care Management (Signed)
Important Message  Patient Details  Name: THAO VANOVER MRN: [id number] Date of Birth: 1923-05-03   Medicare Important Message Given:  Yes-second notification given    Pricilla Handler 09/01/2014, 3:12 PM

## 2014-09-01 NOTE — H&P (Signed)
Chief Complaint: Chief Complaint  Patient presents with  . Shortness of Breath  . Chest Pain    right sided    Referring Physician(s): Salvadore Dom, NP Dr. Titus Mould  History of Present Illness: Johnathan Strong is a 81 y.o. male who admitted in early June with pneumonia.    His PMHx is pertinent for COPD, CLL, Afib, and CAD with history of coronary stents. He has taken Eliquis and Plavix but his has been held.  He currently has a chest tube in place with incomplete drainage of a right loculated pleural effusion.  We are asked to place a pigtail drain in the area of effusion in attempt to avoid a VATS as he has other co-morbidities and is high risk.   Past Medical History  Diagnosis Date  . Osteopenia 2006  . Hyperlipidemia   . BPH (benign prostatic hypertrophy)   . Transient global amnesia   . Osteoarthritis   . History of TIA (transient ischemic attack) 1980'S    NO RESIDUAL  . COPD (chronic obstructive pulmonary disease) with emphysema   . Chronic back pain   . Itching SECONDARY TO CLL-- CONTROLLED W/ SINGULAIR  . Frequency   . Nocturia   . Hypertension CARDIOLOGIST- DR BRACKBILL-- LAST VISIT 12-15-2010 NOTE IN EPIC  . CLL (chronic lymphocytic leukemia) LAST PLT COUNT APRL 2012  168    Managed at [facility]  . Bladder cancer   . Aspirin allergy     Eyes swell  . Asthma   . Dyspnea on exertion   . CAD (coronary artery disease)     a. 08/2012 Cath: LM nl, LAD 90p, LCX 20-30, RCA 100ost fills via L->R collats, EF 55-65%;  b. 09/2012 PCI of prox LAD with 3.0x16 Promus DES.    Past Surgical History  Procedure Laterality Date  . Excisional hemorrhoidectomy  1980  . Cardiovascular stress test  07/2005    a. 07/2005- no reversible ischemia, normal EF b. 06/2009- EF 73%, no reversible ischemia, inferolateral TWIs at rest, upright in recovery c. 08/2012- mediuim-sized partially reversible basal to mid inferior and inferoseptal perfusion defect c/w with  prior infarction and peri-infarct ischemia; EF 68% and borderline ST/T changes on stress ECG   . Transurethral resection of bladder tumor  10-19-2008    AND DILATION URETHRAL STRICTURE  . Cataract extraction w/ intraocular lens  implant, bilateral Bilateral ~ 2007  . Transthoracic echocardiogram  01-03-2011    LVEF 84-69%, grade 1 diastolic dysfunction, no WMAs or structural abnormalities  . Cystoscopy with biopsy  10/30/2011    Procedure: CYSTOSCOPY WITH BIOPSY;  Surgeon: Claybon Jabs, MD;  Location: [facility];  Service: Urology;  Laterality: N/A;  . Cardiac catheterization  08/27/2012  . Coronary angioplasty with stent placement  09/26/2012    "1" (09/26/2012)  . Coronary stent placement  09/26/12  . Left heart catheterization with coronary angiogram N/A 08/27/2012    Procedure: LEFT HEART CATHETERIZATION WITH CORONARY ANGIOGRAM;  Surgeon: Peter M Martinique, MD;  Location: [facility] CATH LAB;  Service: Cardiovascular;  Laterality: N/A;  . Percutaneous coronary stent intervention (pci-s) N/A 09/26/2012    Procedure: PERCUTANEOUS CORONARY STENT INTERVENTION (PCI-S);  Surgeon: Peter M Martinique, MD;  Location: [facility] CATH LAB;  Service: Cardiovascular;  Laterality: N/A;    Allergies: Aspirin; Nsaids; Septra; Albuterol; Benicar; Salicylates; and Ampicillin  Medications: Prior to Admission medications   Medication Sig Start Date End Date Taking? Authorizing Provider  albuterol (VENTOLIN  HFA) 108 (90 BASE) MCG/ACT inhaler Inhale 2 puffs into the lungs every 6 (six) hours as needed for wheezing.    Yes Historical Provider, MD  Azelastine HCl (ASTELIN NA) Place 1 spray into the nose as needed (for congestion).    Yes Historical Provider, MD  Cholecalciferol (VITAMIN D3) 1000 UNITS CAPS Take 1 capsule by mouth daily.    Yes Historical Provider, MD  cimetidine (TAGAMET) 300 MG tablet TAKE 1 TABLET UP TO 4 TIMES A DAY Patient taking differently: TAKE 1 TABLET UP TO 4 TIMES A DAY as needed 01/02/13  Yes  Chipper Herb, MD  clopidogrel (PLAVIX) 75 MG tablet TAKE 1 TABLET ONCE A DAY 03/12/14  Yes [name], MD  diltiazem (CARDIZEM CD) 180 MG 24 hr capsule Take 1 capsule (180 mg total) by mouth daily. 08/13/14  Yes Kelvin Cellar, MD  diphenhydramine-acetaminophen (TYLENOL PM) 25-500 MG TABS Take 1 tablet by mouth at bedtime as needed. sleep   Yes Historical Provider, MD  ELIQUIS 5 MG TABS tablet TAKE  (1)  TABLET TWICE A DAY. 08/14/14  Yes Chipper Herb, MD  fluticasone (FLONASE) 50 MCG/ACT nasal spray USE 2 SPRAYS INTO EACH NOSTRIL ONCE DAILY Patient taking differently: USE 2 SPRAYS INTO EACH NOSTRIL ONCE DAILY as needed for allergies. 07/07/14  Yes Chipper Herb, MD  furosemide (LASIX) 20 MG tablet TAKE (1) TABLET DAILY AS DIRECTED. Patient taking differently: TAKE (1) TABLET DAILY AS DIRECTED as needed for swelling 02/03/14  Yes Chipper Herb, MD  LIPITOR 10 MG tablet TAKE 1 TABLET DAILY Patient taking differently: TAKE 1 TABLET DAILY in the evening. 04/13/14  Yes Chipper Herb, MD  losartan (COZAAR) 50 MG tablet TAKE (1) TABLET TWICE A DAY. 03/13/14  Yes Chipper Herb, MD  metoprolol tartrate (LOPRESSOR) 25 MG tablet Take 1 tablet (25 mg total) by mouth 2 (two) times daily. 05/19/14  Yes Chipper Herb, MD  montelukast (SINGULAIR) 10 MG tablet TAKE ONE TABLET AT BEDTIME 01/28/14  Yes Chipper Herb, MD  nitroGLYCERIN (NITROSTAT) 0.4 MG SL tablet Place 1 tablet (0.4 mg total) under the tongue every 5 (five) minutes as needed for chest pain. 09/27/12  Yes Rogelia Mire, NP  Omega-3 Fatty Acids (FISH OIL) 1200 MG CAPS Take 1 capsule by mouth daily.    Yes Historical Provider, MD  SPIRIVA HANDIHALER 18 MCG inhalation capsule INHALE THE CONTENTS OF ONE CAPSULE ONCE DAILY AS DIRECTED 05/25/14  Yes Chipper Herb, MD  SYMBICORT 160-4.5 MCG/ACT inhaler 2 PUFFS EVERY 12 HOURS 06/17/14  Yes Chipper Herb, MD  traMADol (ULTRAM) 50 MG tablet TAKE 1 TABLET UP TO 2 TIMES A DAY 07/14/14  Yes Chipper Herb,  MD  triamcinolone cream (KENALOG) 0.1 % APPLY TO AFFECTED AREAS 2 TIMES A DAY AS NEEDED 01/02/13  Yes Chipper Herb, MD  cefUROXime (CEFTIN) 250 MG tablet Take 1 tablet (250 mg total) by mouth 2 (two) times daily with a meal. Patient not taking: Reported on 08/25/2014 08/13/14   Kelvin Cellar, MD     Family History  Problem Relation Age of Onset  . Emphysema Father   . Heart disease Father   . COPD Father   . Atrial fibrillation Father   . Kidney cancer Mother   . Kidney failure Mother     History   Social History  . Marital Status: Married    Spouse Name: N/A  . Number of Children: N/A  . Years of Education:  N/A   Occupational History  . retired Scientist, product/process development    Social History Main Topics  . Smoking status: Former Smoker -- 1.00 packs/day for 25 years    Types: Cigarettes    Quit date: 03/06/1960  . Smokeless tobacco: Former Systems developer    Quit date: 10/25/1978  . Alcohol Use: 0.0 oz/week    0 Standard drinks or equivalent per week     Comment: 09/26/2012 "glass of wine or mixed drink once/month"  . Drug Use: No  . Sexual Activity: No   Other Topics Concern  . None   Social History Narrative     Review of Systems  Constitutional: Positive for activity change and fatigue. Negative for fever and chills.  Respiratory: Positive for cough and shortness of breath. Negative for chest tightness.   Cardiovascular: Negative for chest pain.  Gastrointestinal: Negative for nausea, vomiting and abdominal pain.  Musculoskeletal: Positive for arthralgias.  Skin: Negative.   Neurological: Negative.   Psychiatric/Behavioral: Negative.     Vital Signs: BP 143/66 mmHg  Pulse 86  Temp(Src) 98.3 F (36.8 C) (Oral)  Resp 20  Ht 5\' 8"  (1.727 m)  Wt 171 lb 1.6 oz (77.61 kg)  BMI 26.02 kg/m2  SpO2 100%  Physical Exam  Constitutional: He is oriented to person, place, and time.  Cardiovascular: Normal rate, regular rhythm and normal heart sounds.   Pulmonary/Chest:  Effort normal.  Diminished breath sounds at the right base c/w known pleural effusion.  Right chest tube in place to low wall suction.  Musculoskeletal: Normal range of motion.  Neurological: He is alert and oriented to person, place, and time.  Skin: Skin is warm and dry.  Psychiatric: He has a normal mood and affect. His behavior is normal. Judgment and thought content normal.  Vitals reviewed.   Mallampati Score:  MD Evaluation Airway: WNL Heart: WNL Abdomen: WNL Chest/ Lungs: Other (comments) Chest/ lungs comments: diminished breath sounds on right secondary to pleural effusion, chest tube in place ASA  Classification: 3 Mallampati/Airway Score: Two  Imaging: Dg Chest 1 View  08/26/2014   CLINICAL DATA:  Post right thoracentesis, pleural effusion.  EXAM: CHEST  1 VIEW  COMPARISON:  08/25/2014  FINDINGS: Slight decreased size of the right effusion which remains moderate. No visible pneumothorax. Continued right lower lobe atelectasis or infiltrate. Left lung is clear. Heart is normal size. No acute bony abnormality.  IMPRESSION: No pneumothorax following thoracentesis. Continued moderate right effusion with right lower lobe atelectasis or infiltrate.   Electronically Signed   By: Rolm Baptise M.D.   On: 08/26/2014 11:42   Dg Chest 2 View  08/31/2014   CLINICAL DATA:  Right pleural effusion with chest tube drainage, shortness of breath.  EXAM: CHEST  2 VIEW  COMPARISON:  Chest x-ray of June Jul 29, 2014 and chest CT scan of August 30, 2014.  FINDINGS: On the right there is mild volume loss and a moderate-sized pleural effusion. The right chest tube tip projects over the posterior aspect of the seventh rib and is unchanged. There is atelectasis or pneumonia at the right lung base which is stable. The left lung is well-expanded. There is a stable approximately 11 mm calcified nodule in the left lower lobe. There is no mediastinal shift. The cardiac silhouette is mildly enlarged. The pulmonary  vascularity is normal. The bony thorax exhibits no acute abnormality.  IMPRESSION: Persistent moderate-sized inferior right pleural effusion. The tip of the right chest tube lies along the superior aspect  of the fluid collection similar to the positioning described on the CT scan.  Mild stable enlargement of the cardiac silhouette without pulmonary edema.  Evidence of previous granulomatous infection.   Electronically Signed   By: David  Martinique M.D.   On: 08/31/2014 09:19   Dg Chest 2 View  08/25/2014   CLINICAL DATA:  81 year old male with shortness of breath and chest pain since last night. Pain radiating to the right shoulder with chills. Initial encounter. Recent diagnosis of pneumonia.  EXAM: CHEST  2 VIEW  COMPARISON:  08/13/2014 and earlier.  FINDINGS: Seated upright AP and lateral views of the chest. Continued moderate size right pleural effusion with dense right lung base opacification. Lower lung volumes. No pneumothorax or pulmonary edema. Stable cardiac size and mediastinal contours. The left lung remains clear aside from evidence of a chronic lower lobe pulmonary nodule which has not significantly changed since 2013. Calcified atherosclerosis of the aorta again noted. No acute osseous abnormality identified.  IMPRESSION: 1. Continued moderate right pleural effusion with associated right lung base collapse/consolidation. 2. No new cardiopulmonary abnormality. Chronic left lower lobe lung nodule.   Electronically Signed   By: Genevie Ann M.D.   On: 08/25/2014 08:17   Dg Chest 2 View  08/13/2014   CLINICAL DATA:  Shortness of breath weakness pneumonia  EXAM: CHEST  2 VIEW  COMPARISON:  08/11/2014  FINDINGS: Moderate right effusion with underlying consolidation stable. Heart size and vascular pattern normal. No evidence of infiltrate on the left. Dense circumscribed oval 1 cm nodule to the left of the descending thoracic aorta stable from 02/26/2012 and therefore considered to be benign. This projects over  the spine on the lateral radiograph and it is likely a granuloma given its density and stability.  IMPRESSION: No change from prior study. Moderate right effusion and lower lobe consolidation.   Electronically Signed   By: Skipper Cliche M.D.   On: 08/13/2014 14:08   Dg Chest 2 View  08/11/2014   CLINICAL DATA:  Chest pain  EXAM: CHEST  2 VIEW  COMPARISON:  08/10/2014  FINDINGS: Heart is normal size. Consolidation in the right lower lobe concerning for pneumonia. Moderate right pleural effusion. Left lung is clear. No acute bony abnormality.  IMPRESSION: Continued right lower lobe consolidation and moderate right effusion, stable or slightly worsened since prior study.   Electronically Signed   By: Rolm Baptise M.D.   On: 08/11/2014 15:26   Dg Chest 2 View  08/10/2014   CLINICAL DATA:  Pneumonia, shortness of breath.  EXAM: CHEST  2 VIEW  COMPARISON:  PA and lateral chest x-ray of August 09, 2014  FINDINGS: The left lung is well-expanded. On the right there is persistent pleural fluid and increased basilar parenchymal density. The heart and pulmonary vascularity are normal. The mediastinum is normal in width. There is calcification in the wall of the thoracic aorta. There is mild gaseous distention of bowel under the left hemidiaphragm. The bony thorax exhibits no acute abnormality.  IMPRESSION: Persistent right lower lobe atelectasis or pneumonia and small right pleural effusion. There is likely underlying COPD. There has been interval development of minimal left basilar subsegmental atelectasis.   Electronically Signed   By: David  Martinique M.D.   On: 08/10/2014 07:53   Ct Chest Wo Contrast  08/30/2014   CLINICAL DATA:  Right pleural effusion and right-sided chest tube. Patient receiving intrapleural tPA.  EXAM: CT CHEST WITHOUT CONTRAST  TECHNIQUE: Multidetector CT imaging of the chest was  performed following the standard protocol without IV contrast.  COMPARISON:  CT 08/25/2014 and chest x-ray 08/29/2014   FINDINGS: Right anterior lateral chest tube courses into the posterior pleural space at the level of the mid lungs as tip is within the most superior aspect of the pleural fluid collection. Minimal subcutaneous emphysema over the right flank. There is a moderate size right pleural effusion with mild interval improvement. There is continued compressive atelectasis over the right lower lobe and right middle lobes. Small collection of air within the pleural space over the anterior aspect of the pleural effusion near the entry site at the chest tube. Calcified granuloma over the left lower lobe. Minimal scarring over the posterior left lower lobe. Airways are within normal.  Mild stable cardiomegaly. Calcification of the mitral valve annulus. Moderate calcification of the left main and 3 vessel coronary arteries unchanged. Coronary stents present. Tiny amount of pericardial fluid present. Mild-to-moderate calcified plaque over the thoracic aorta. No mediastinal, hilar or axillary adenopathy. Stable sub cm lymph node over the right retrocrural region at the level of the diaphragm.  Images through the upper abdomen are unchanged. There are degenerative changes of the spine.  IMPRESSION: Interval improvement in a moderate size right pleural fluid collection with associated compressive atelectasis over the right middle and lower lobes. Right-sided chest tube in with tip over the posterior pleural space in the most superior aspect of the pleural fluid collection.  Prior granulomas disease.  Atherosclerotic coronary artery disease with coronary stents present. Mild calcification of the mitral valve annulus. Mild cardiomegaly.   Electronically Signed   By: Marin Olp M.D.   On: 08/30/2014 09:31   Ct Chest W Contrast  08/25/2014   CLINICAL DATA:  Pleurisy and increase shortness of breath  EXAM: CT CHEST WITH CONTRAST  TECHNIQUE: Multidetector CT imaging of the chest was performed during intravenous contrast  administration.  CONTRAST:  60mL OMNIPAQUE IOHEXOL 300 MG/ML  SOLN  COMPARISON:  None.  FINDINGS: THORACIC INLET/BODY WALL:  No acute abnormality.  MEDIASTINUM:  Normal heart size. No pericardial effusion. Extensive coronary atherosclerosis. No acute vascular abnormality. No adenopathy.  LUNG WINDOWS:  Large right pleural effusion which is water density. The fluid is predominantly free-flowing based on comparison to previous chest x-ray, but there is some complex features with scalloping of the lung margins. The right lower lobe and lateral segment right middle lobe are collapsed. There is no evidence of pleural nodularity or lung mass. No pneumonia is suspected.  Calcified granuloma in the left lower lobe.  UPPER ABDOMEN:  Partly visualized left renal cyst, imaged portions stable from 09/29/2008 abdominal CT  OSSEOUS:  No acute fracture.  No suspicious lytic or blastic lesions.  IMPRESSION: Large right pleural effusion with lower lobe and middle lobe segmental collapse. The effusion is predominantly free-flowing based on prior chest x-ray, but there may be partial loculation where the lung is scalloped . No pleural or pulmonary cause is identified.   Electronically Signed   By: Monte Fantasia M.D.   On: 08/25/2014 15:09   Dg Chest Port 1 View  08/29/2014   CLINICAL DATA:  Evaluate pleural effusions  EXAM: PORTABLE CHEST - 1 VIEW  COMPARISON:  08/28/2014  FINDINGS: Right chest tube in place. No pneumothorax identified. The right-sided pleural effusion is unchanged in volume from previous exam. There is atelectasis in the right base. Gas is noted within the soft tissues of the right chest wall surrounding the chest tube.  IMPRESSION: 1. No change  in right pleural effusion.   Electronically Signed   By: Kerby Moors M.D.   On: 08/29/2014 13:12   Dg Chest Port 1 View  08/28/2014   CLINICAL DATA:  Chest tube placement for effusion.  EXAM: PORTABLE CHEST - 1 VIEW  COMPARISON:  Study obtained earlier in the day   FINDINGS: There is a chest tube on the right without demonstrable pneumothorax. Right effusion is smaller. There is a fairly small right effusion with right base consolidation and patchy atelectasis. Lungs elsewhere clear. There is no appreciable interstitial edema. The heart size and pulmonary vascularity are normal. There is atherosclerotic change in the aorta. No adenopathy. No bone lesions.  IMPRESSION: Chest tube in place on the right. No pneumothorax. Note that there is soft tissue air tracking along the chest tube in the lateral right hemithorax inferiorly. Right pleural effusion is smaller compared to earlier in the day. There is a residual right effusion with right base consolidation and patchy atelectasis.  There is no evidence of pulmonary edema. No change in cardiac silhouette.   Electronically Signed   By: Lowella Grip III M.D.   On: 08/28/2014 15:19   Dg Chest Port 1 View  08/28/2014   CLINICAL DATA:  Pleural effusion.  Pulmonary infection.  EXAM: PORTABLE CHEST - 1 VIEW  COMPARISON:  08/27/2014.  FINDINGS: Right chest tube in stable position. Mediastinum and hilar structures are normal. Cardiomegaly with normal pulmonary vascularity. Right lower lobe infiltrate consistent with pneumonia. Right pleural effusion. Right chest wall subcutaneous emphysema again noted.  IMPRESSION: 1.  Right chest tube in stable position.  No pneumothorax.  2. Persistent dense right lower lobe infiltrate and right pleural effusion. No interim change.   Electronically Signed   By: Marcello Moores  Register   On: 08/28/2014 08:15   Dg Chest Port 1 View  08/27/2014   CLINICAL DATA:  Chest tube placement.  EXAM: PORTABLE CHEST - 1 VIEW  COMPARISON:  Single view of the chest 08/26/2014.  FINDINGS: A new right chest tube is in place. Right pleural effusion and basilar airspace disease are again seen. No pneumothorax is identified. The left lung appears clear. Heart size is normal.  IMPRESSION: Status post right chest tube  placement. Right pleural effusion basilar airspace disease do not appear notably changed. No pneumothorax is seen.   Electronically Signed   By: Inge Rise M.D.   On: 08/27/2014 18:30   US Thoracentesis Asp Pleural Space W/img Guide  08/26/2014   INDICATION: Symptomatic R sided pleural effusion  EXAM: US THORACENTESIS ASP PLEURAL SPACE W/IMG GUIDE  COMPARISON:  None.  MEDICATIONS: 10 cc 1% lidocaine  COMPLICATIONS: None immediate  TECHNIQUE: Informed written consent was obtained from the patient after a discussion of the risks, benefits and alternatives to treatment. A timeout was performed prior to the initiation of the procedure.  Initial ultrasound scanning demonstrates a R pleural effusion. The lower chest was prepped and draped in the usual sterile fashion. 1% lidocaine was used for local anesthesia.  Under direct ultrasound guidance, a 19 gauge, 7-cm, Yueh catheter was introduced. An ultrasound image was saved for documentation purposes. The thoracentesis was performed. The catheter was removed and a dressing was applied. The patient tolerated the procedure well without immediate post procedural complication. The patient was escorted to have an upright chest radiograph.  FINDINGS: A total of approximately 1 liters of bloody fluid was removed. Requested samples were sent to the laboratory.  IMPRESSION: Successful ultrasound-guided R sided thoracentesis yielding 1  liters of pleural fluid.  Read by:; Lavonia Drafts [facility]   Electronically Signed   By: Corrie Mckusick D.O.   On: 08/26/2014 15:53    Labs:  CBC:  Recent Labs  08/29/14 0600 08/30/14 0410 08/31/14 0320 09/01/14 0517  WBC 17.6* 16.1* 21.0* 20.1*  HGB 10.7* 10.1* 10.1* 9.4*  HCT 32.1* 30.2* 30.2* 27.9*  PLT 239 235 231 246    COAGS:  Recent Labs  04/23/14 1415  INR 1.17  APTT 30    BMP:  Recent Labs  08/29/14 0600 08/30/14 0410 08/31/14 0320 09/01/14 0517  NA 130* 130* 129* 128*  K 4.2 4.5 5.0 4.7  CL 98* 98*  100* 100*  CO2 25 24 21* 23  GLUCOSE 116* 104* 110* 113*  BUN 30* 36* 29* 30*  CALCIUM 8.5* 8.2* 7.9* 7.9*  CREATININE 1.14 1.29* 1.24 1.12  GFRNONAA 55* 47* 50* 56*  GFRAA >60 55* 58* >60    LIVER FUNCTION TESTS:  Recent Labs  04/21/14 1541 08/10/14 0658 08/25/14 0700 08/26/14 0448  BILITOT 1.1 0.7 1.5* 1.4*  AST 24 44* 19 13*  ALT 18 58 18 14*  ALKPHOS 55 66 72 66  PROT 6.6 5.5* 6.3* 5.8*  ALBUMIN 4.5 3.0* 3.8 3.2*    TUMOR MARKERS: No results for input(s): AFPTM, CEA, CA199, CHROMGRNA in the last 8760 hours.  Assessment and Plan:  *Chart reviewed and CT scan reviewed by Dr. Pascal Lux*  Right loculated  Pleural effusion. Other co-morbidities including COPD, CLL, Afib, CAD with history of coronary stents, pneumonia, and advanced age which make VATS high risk.  Will place right pleural pigtail drain under image guidance today by Dr. Pascal Lux.  Risks and Benefits discussed with the patient including bleeding, infection, damage to adjacent structures, hemoptysis, respiratory failure requiring intubation, infection, pneumothorax requiring chest tube placement, stroke from air embolism or even death. All of the patient's questions were answered, patient is agreeable to proceed. Consent signed and in chart.   Thank you for this interesting consult.  I greatly enjoyed meeting ROMELO SCIANDRA and look forward to participating in their care.  Signed: Murrell Redden 09/01/2014, 9:25 AM   I spent a total of 40 Minutes  in face to face in clinical consultation, greater than 50% of which was counseling/coordinating care for  Placement of right pleural pigtail drain.

## 2014-09-01 NOTE — Consult Note (Signed)
Patient ID: Johnathan Strong MRN: [id number], DOB/AGE: 1923-11-09   Admit date: 08/25/2014   Primary Physician: Redge Gainer, MD Primary Cardiologist: Dr. Mare Ferrari   Problem List  Past Medical History  Diagnosis Date  . Osteopenia 2006  . Hyperlipidemia   . BPH (benign prostatic hypertrophy)   . Transient global amnesia   . Osteoarthritis   . History of TIA (transient ischemic attack) 1980'S    NO RESIDUAL  . COPD (chronic obstructive pulmonary disease) with emphysema   . Chronic back pain   . Itching SECONDARY TO CLL-- CONTROLLED W/ SINGULAIR  . Frequency   . Nocturia   . Hypertension CARDIOLOGIST- DR BRACKBILL-- LAST VISIT 12-15-2010 NOTE IN EPIC  . CLL (chronic lymphocytic leukemia) LAST PLT COUNT APRL 2012  168    Managed at [facility]  . Bladder cancer   . Aspirin allergy     Eyes swell  . Asthma   . Dyspnea on exertion   . CAD (coronary artery disease)     a. 08/2012 Cath: LM nl, LAD 90p, LCX 20-30, RCA 100ost fills via L->R collats, EF 55-65%;  b. 09/2012 PCI of prox LAD with 3.0x16 Promus DES.    Past Surgical History  Procedure Laterality Date  . Excisional hemorrhoidectomy  1980  . Cardiovascular stress test  07/2005    a. 07/2005- no reversible ischemia, normal EF b. 06/2009- EF 73%, no reversible ischemia, inferolateral TWIs at rest, upright in recovery c. 08/2012- mediuim-sized partially reversible basal to mid inferior and inferoseptal perfusion defect c/w with prior infarction and peri-infarct ischemia; EF 68% and borderline ST/T changes on stress ECG   . Transurethral resection of bladder tumor  10-19-2008    AND DILATION URETHRAL STRICTURE  . Cataract extraction w/ intraocular lens  implant, bilateral Bilateral ~ 2007  . Transthoracic echocardiogram  01-03-2011    LVEF 07-37%, grade 1 diastolic dysfunction, no WMAs or structural abnormalities  . Cystoscopy with biopsy  10/30/2011    Procedure: CYSTOSCOPY WITH BIOPSY;  Surgeon: Claybon Jabs, MD;  Location: [facility];  Service: Urology;  Laterality: N/A;  . Cardiac catheterization  08/27/2012  . Coronary angioplasty with stent placement  09/26/2012    "1" (09/26/2012)  . Coronary stent placement  09/26/12  . Left heart catheterization with coronary angiogram N/A 08/27/2012    Procedure: LEFT HEART CATHETERIZATION WITH CORONARY ANGIOGRAM;  Surgeon: Peter M Martinique, MD;  Location: [facility] CATH LAB;  Service: Cardiovascular;  Laterality: N/A;  . Percutaneous coronary stent intervention (pci-s) N/A 09/26/2012    Procedure: PERCUTANEOUS CORONARY STENT INTERVENTION (PCI-S);  Surgeon: Peter M Martinique, MD;  Location: [facility] CATH LAB;  Service: Cardiovascular;  Laterality: N/A;     Allergies  Allergies  Allergen Reactions  . Aspirin Anaphylaxis, Shortness Of Breath and Swelling  . Nsaids Anaphylaxis, Shortness Of Breath and Swelling    Raise BP  . Septra [Sulfamethoxazole-Trimethoprim] Rash  . Albuterol     Can not tolerate high/ frequent doses due to [place] with using HFA PRN  . Benicar [Olmesartan Medoxomil]     dizzy  . Salicylates     SOB  . Ampicillin Rash    HPI  The patient is a 81 y/o male, followed by Dr. Mare Ferrari. He has a h/o CAD, COPD, CLL which is followed at [facility] and PAF. His CHA2DS2 VASc score is 6 for hypertension, age above 34, history of TIA, and vascular disease with prior coronary  artery stent. He is on Eliquis for a/c. He underwent cardiac catheterization on 08/27/2012, in the setting of dyspnea and an abnormal NST. This demonstrated ostial occlusion of the right coronary with excellent left to right collateral flow. He had a high-grade proximal LAD stenosis. He underwent successful PCI utilizing a DES. LV function was well-preserved.    He was recently admitted 08/08/13 for CAP and treated with antibiotics. He was discharged on 6/15. During that admission had PAF which reverted back to NSR as he recovered He presented back on 6/21 with fever,  chills and night sweats. Chest x-ray performed on admission revealed moderate right pleural effusion with associated right lung base consolidation/collapse. He was started on broad-spectrum IV antibiotic therapy with vancomycin and ceftazidime. He also underwent insertion of a chest tube by Critical Care. Anticoagulation has been held due to bloody effusion and need to t-PA chest tubes. On 6/23 he went into atrial fibrillation with RVR. He has been placed on cardizem 240 and metoprolol 25 tid. VR has come down from 140 and now ranges from 100-115 Cardiology has been consulted for recommendations. Denies CP or palpitations. + SOB. No hemoptysis    Home Medications  Prior to Admission medications   Medication Sig Start Date End Date Taking? Authorizing Provider  albuterol (VENTOLIN HFA) 108 (90 BASE) MCG/ACT inhaler Inhale 2 puffs into the lungs every 6 (six) hours as needed for wheezing.    Yes Historical Provider, MD  Azelastine HCl (ASTELIN NA) Place 1 spray into the nose as needed (for congestion).    Yes Historical Provider, MD  Cholecalciferol (VITAMIN D3) 1000 UNITS CAPS Take 1 capsule by mouth daily.    Yes Historical Provider, MD  cimetidine (TAGAMET) 300 MG tablet TAKE 1 TABLET UP TO 4 TIMES A DAY Patient taking differently: TAKE 1 TABLET UP TO 4 TIMES A DAY as needed 01/02/13  Yes Chipper Herb, MD  clopidogrel (PLAVIX) 75 MG tablet TAKE 1 TABLET ONCE A DAY 03/12/14  Yes [name], MD  diltiazem (CARDIZEM CD) 180 MG 24 hr capsule Take 1 capsule (180 mg total) by mouth daily. 08/13/14  Yes Kelvin Cellar, MD  diphenhydramine-acetaminophen (TYLENOL PM) 25-500 MG TABS Take 1 tablet by mouth at bedtime as needed. sleep   Yes Historical Provider, MD  ELIQUIS 5 MG TABS tablet TAKE  (1)  TABLET TWICE A DAY. 08/14/14  Yes Chipper Herb, MD  fluticasone (FLONASE) 50 MCG/ACT nasal spray USE 2 SPRAYS INTO EACH NOSTRIL ONCE DAILY Patient taking differently: USE 2 SPRAYS INTO EACH NOSTRIL ONCE  DAILY as needed for allergies. 07/07/14  Yes Chipper Herb, MD  furosemide (LASIX) 20 MG tablet TAKE (1) TABLET DAILY AS DIRECTED. Patient taking differently: TAKE (1) TABLET DAILY AS DIRECTED as needed for swelling 02/03/14  Yes Chipper Herb, MD  LIPITOR 10 MG tablet TAKE 1 TABLET DAILY Patient taking differently: TAKE 1 TABLET DAILY in the evening. 04/13/14  Yes Chipper Herb, MD  losartan (COZAAR) 50 MG tablet TAKE (1) TABLET TWICE A DAY. 03/13/14  Yes Chipper Herb, MD  metoprolol tartrate (LOPRESSOR) 25 MG tablet Take 1 tablet (25 mg total) by mouth 2 (two) times daily. 05/19/14  Yes Chipper Herb, MD  montelukast (SINGULAIR) 10 MG tablet TAKE ONE TABLET AT BEDTIME 01/28/14  Yes Chipper Herb, MD  nitroGLYCERIN (NITROSTAT) 0.4 MG SL tablet Place 1 tablet (0.4 mg total) under the tongue every 5 (five) minutes as needed for chest pain. 09/27/12  Yes  Rogelia Mire, NP  Omega-3 Fatty Acids (FISH OIL) 1200 MG CAPS Take 1 capsule by mouth daily.    Yes Historical Provider, MD  SPIRIVA HANDIHALER 18 MCG inhalation capsule INHALE THE CONTENTS OF ONE CAPSULE ONCE DAILY AS DIRECTED 05/25/14  Yes Chipper Herb, MD  SYMBICORT 160-4.5 MCG/ACT inhaler 2 PUFFS EVERY 12 HOURS 06/17/14  Yes Chipper Herb, MD  traMADol (ULTRAM) 50 MG tablet TAKE 1 TABLET UP TO 2 TIMES A DAY 07/14/14  Yes Chipper Herb, MD  triamcinolone cream (KENALOG) 0.1 % APPLY TO AFFECTED AREAS 2 TIMES A DAY AS NEEDED 01/02/13  Yes Chipper Herb, MD  cefUROXime (CEFTIN) 250 MG tablet Take 1 tablet (250 mg total) by mouth 2 (two) times daily with a meal. Patient not taking: Reported on 08/25/2014 08/13/14   Kelvin Cellar, MD    Family History  Family History  Problem Relation Age of Onset  . Emphysema Father   . Heart disease Father   . COPD Father   . Atrial fibrillation Father   . Kidney cancer Mother   . Kidney failure Mother     Social History  History   Social History  . Marital Status: Married    Spouse Name: N/A    . Number of Children: N/A  . Years of Education: N/A   Occupational History  . retired Scientist, product/process development    Social History Main Topics  . Smoking status: Former Smoker -- 1.00 packs/day for 25 years    Types: Cigarettes    Quit date: 03/06/1960  . Smokeless tobacco: Former Systems developer    Quit date: 10/25/1978  . Alcohol Use: 0.0 oz/week    0 Standard drinks or equivalent per week     Comment: 09/26/2012 "glass of wine or mixed drink once/month"  . Drug Use: No  . Sexual Activity: No   Other Topics Concern  . Not on file   Social History Narrative     Review of Systems General:  No chills, fever, night sweats or weight changes.  Cardiovascular:  No chest pain, dyspnea on exertion, edema, orthopnea, palpitations, paroxysmal nocturnal dyspnea. Dermatological: No rash, lesions/masses Respiratory: No cough, dyspnea Urologic: No hematuria, dysuria Abdominal:   No nausea, vomiting, diarrhea, bright red blood per rectum, melena, or hematemesis Neurologic:  No visual changes, wkns, changes in mental status. All other systems reviewed and are otherwise negative except as noted above.  Physical Exam  Blood pressure 105/57, pulse 141, temperature 98.8 F (37.1 C), temperature source Oral, resp. rate 29, height 5\' 8"  (1.727 m), weight 171 lb 1.6 oz (77.61 kg), SpO2 98 %.  General: Pleasant, chronically ill-appearing NAD Psych: Normal affect. Neuro: Alert and oriented X 3. Moves all extremities spontaneously. HEENT: Normal  Neck: Supple without bruits or JVD. Lungs:  Decreased BS at R base. No wheezing Heart: IRR no s3, s4, or murmurs. Abdomen: Soft, non-tender, non-distended, BS + x 4.  Extremities: No clubbing, cyanosis or edema. DP/PT/Radials 2+ and equal bilaterally.  Labs  Troponin (Point of Care Test) No results for input(s): TROPIPOC in the last 72 hours. No results for input(s): CKTOTAL, CKMB, TROPONINI in the last 72 hours. Lab Results  Component Value Date   WBC  20.1* 09/01/2014   HGB 9.4* 09/01/2014   HCT 27.9* 09/01/2014   MCV 87.2 09/01/2014   PLT 246 09/01/2014    Recent Labs Lab 08/26/14 0448  09/01/14 0517  NA 136  < > 128*  K 5.1  < >  4.7  CL 99*  < > 100*  CO2 28  < > 23  BUN 16  < > 30*  CREATININE 1.13  < > 1.12  CALCIUM 8.7*  < > 7.9*  PROT 5.8*  --   --   BILITOT 1.4*  --   --   ALKPHOS 66  --   --   ALT 14*  --   --   AST 13*  --   --   GLUCOSE 140*  < > 113*  < > = values in this interval not displayed. Lab Results  Component Value Date   CHOL 109 04/03/2014   HDL 44 04/03/2014   LDLCALC 47 10/30/2013   TRIG 61 04/03/2014   No results found for: DDIMER   Radiology/Studies  Dg Chest 1 View  08/26/2014   CLINICAL DATA:  Post right thoracentesis, pleural effusion.  EXAM: CHEST  1 VIEW  COMPARISON:  08/25/2014  FINDINGS: Slight decreased size of the right effusion which remains moderate. No visible pneumothorax. Continued right lower lobe atelectasis or infiltrate. Left lung is clear. Heart is normal size. No acute bony abnormality.  IMPRESSION: No pneumothorax following thoracentesis. Continued moderate right effusion with right lower lobe atelectasis or infiltrate.   Electronically Signed   By: Rolm Baptise M.D.   On: 08/26/2014 11:42   Dg Chest 2 View  08/31/2014   CLINICAL DATA:  Right pleural effusion with chest tube drainage, shortness of breath.  EXAM: CHEST  2 VIEW  COMPARISON:  Chest x-ray of June Jul 29, 2014 and chest CT scan of August 30, 2014.  FINDINGS: On the right there is mild volume loss and a moderate-sized pleural effusion. The right chest tube tip projects over the posterior aspect of the seventh rib and is unchanged. There is atelectasis or pneumonia at the right lung base which is stable. The left lung is well-expanded. There is a stable approximately 11 mm calcified nodule in the left lower lobe. There is no mediastinal shift. The cardiac silhouette is mildly enlarged. The pulmonary vascularity is  normal. The bony thorax exhibits no acute abnormality.  IMPRESSION: Persistent moderate-sized inferior right pleural effusion. The tip of the right chest tube lies along the superior aspect of the fluid collection similar to the positioning described on the CT scan.  Mild stable enlargement of the cardiac silhouette without pulmonary edema.  Evidence of previous granulomatous infection.   Electronically Signed   By: David  Martinique M.D.   On: 08/31/2014 09:19   Dg Chest 2 View  08/25/2014   CLINICAL DATA:  81 year old male with shortness of breath and chest pain since last night. Pain radiating to the right shoulder with chills. Initial encounter. Recent diagnosis of pneumonia.  EXAM: CHEST  2 VIEW  COMPARISON:  08/13/2014 and earlier.  FINDINGS: Seated upright AP and lateral views of the chest. Continued moderate size right pleural effusion with dense right lung base opacification. Lower lung volumes. No pneumothorax or pulmonary edema. Stable cardiac size and mediastinal contours. The left lung remains clear aside from evidence of a chronic lower lobe pulmonary nodule which has not significantly changed since 2013. Calcified atherosclerosis of the aorta again noted. No acute osseous abnormality identified.  IMPRESSION: 1. Continued moderate right pleural effusion with associated right lung base collapse/consolidation. 2. No new cardiopulmonary abnormality. Chronic left lower lobe lung nodule.   Electronically Signed   By: Genevie Ann M.D.   On: 08/25/2014 08:17   Dg Chest 2 View  08/13/2014  CLINICAL DATA:  Shortness of breath weakness pneumonia  EXAM: CHEST  2 VIEW  COMPARISON:  08/11/2014  FINDINGS: Moderate right effusion with underlying consolidation stable. Heart size and vascular pattern normal. No evidence of infiltrate on the left. Dense circumscribed oval 1 cm nodule to the left of the descending thoracic aorta stable from 02/26/2012 and therefore considered to be benign. This projects over the spine on  the lateral radiograph and it is likely a granuloma given its density and stability.  IMPRESSION: No change from prior study. Moderate right effusion and lower lobe consolidation.   Electronically Signed   By: Skipper Cliche M.D.   On: 08/13/2014 14:08   Dg Chest 2 View  08/11/2014   CLINICAL DATA:  Chest pain  EXAM: CHEST  2 VIEW  COMPARISON:  08/10/2014  FINDINGS: Heart is normal size. Consolidation in the right lower lobe concerning for pneumonia. Moderate right pleural effusion. Left lung is clear. No acute bony abnormality.  IMPRESSION: Continued right lower lobe consolidation and moderate right effusion, stable or slightly worsened since prior study.   Electronically Signed   By: Rolm Baptise M.D.   On: 08/11/2014 15:26   Dg Chest 2 View  08/10/2014   CLINICAL DATA:  Pneumonia, shortness of breath.  EXAM: CHEST  2 VIEW  COMPARISON:  PA and lateral chest x-ray of August 09, 2014  FINDINGS: The left lung is well-expanded. On the right there is persistent pleural fluid and increased basilar parenchymal density. The heart and pulmonary vascularity are normal. The mediastinum is normal in width. There is calcification in the wall of the thoracic aorta. There is mild gaseous distention of bowel under the left hemidiaphragm. The bony thorax exhibits no acute abnormality.  IMPRESSION: Persistent right lower lobe atelectasis or pneumonia and small right pleural effusion. There is likely underlying COPD. There has been interval development of minimal left basilar subsegmental atelectasis.   Electronically Signed   By: David  Martinique M.D.   On: 08/10/2014 07:53   Ct Chest Wo Contrast  08/30/2014   CLINICAL DATA:  Right pleural effusion and right-sided chest tube. Patient receiving intrapleural tPA.  EXAM: CT CHEST WITHOUT CONTRAST  TECHNIQUE: Multidetector CT imaging of the chest was performed following the standard protocol without IV contrast.  COMPARISON:  CT 08/25/2014 and chest x-ray 08/29/2014  FINDINGS: Right  anterior lateral chest tube courses into the posterior pleural space at the level of the mid lungs as tip is within the most superior aspect of the pleural fluid collection. Minimal subcutaneous emphysema over the right flank. There is a moderate size right pleural effusion with mild interval improvement. There is continued compressive atelectasis over the right lower lobe and right middle lobes. Small collection of air within the pleural space over the anterior aspect of the pleural effusion near the entry site at the chest tube. Calcified granuloma over the left lower lobe. Minimal scarring over the posterior left lower lobe. Airways are within normal.  Mild stable cardiomegaly. Calcification of the mitral valve annulus. Moderate calcification of the left main and 3 vessel coronary arteries unchanged. Coronary stents present. Tiny amount of pericardial fluid present. Mild-to-moderate calcified plaque over the thoracic aorta. No mediastinal, hilar or axillary adenopathy. Stable sub cm lymph node over the right retrocrural region at the level of the diaphragm.  Images through the upper abdomen are unchanged. There are degenerative changes of the spine.  IMPRESSION: Interval improvement in a moderate size right pleural fluid collection with associated compressive atelectasis over  the right middle and lower lobes. Right-sided chest tube in with tip over the posterior pleural space in the most superior aspect of the pleural fluid collection.  Prior granulomas disease.  Atherosclerotic coronary artery disease with coronary stents present. Mild calcification of the mitral valve annulus. Mild cardiomegaly.   Electronically Signed   By: Marin Olp M.D.   On: 08/30/2014 09:31   Ct Chest W Contrast  08/25/2014   CLINICAL DATA:  Pleurisy and increase shortness of breath  EXAM: CT CHEST WITH CONTRAST  TECHNIQUE: Multidetector CT imaging of the chest was performed during intravenous contrast administration.  CONTRAST:   25mL OMNIPAQUE IOHEXOL 300 MG/ML  SOLN  COMPARISON:  None.  FINDINGS: THORACIC INLET/BODY WALL:  No acute abnormality.  MEDIASTINUM:  Normal heart size. No pericardial effusion. Extensive coronary atherosclerosis. No acute vascular abnormality. No adenopathy.  LUNG WINDOWS:  Large right pleural effusion which is water density. The fluid is predominantly free-flowing based on comparison to previous chest x-ray, but there is some complex features with scalloping of the lung margins. The right lower lobe and lateral segment right middle lobe are collapsed. There is no evidence of pleural nodularity or lung mass. No pneumonia is suspected.  Calcified granuloma in the left lower lobe.  UPPER ABDOMEN:  Partly visualized left renal cyst, imaged portions stable from 09/29/2008 abdominal CT  OSSEOUS:  No acute fracture.  No suspicious lytic or blastic lesions.  IMPRESSION: Large right pleural effusion with lower lobe and middle lobe segmental collapse. The effusion is predominantly free-flowing based on prior chest x-ray, but there may be partial loculation where the lung is scalloped . No pleural or pulmonary cause is identified.   Electronically Signed   By: Monte Fantasia M.D.   On: 08/25/2014 15:09   Dg Chest Port 1 View  08/29/2014   CLINICAL DATA:  Evaluate pleural effusions  EXAM: PORTABLE CHEST - 1 VIEW  COMPARISON:  08/28/2014  FINDINGS: Right chest tube in place. No pneumothorax identified. The right-sided pleural effusion is unchanged in volume from previous exam. There is atelectasis in the right base. Gas is noted within the soft tissues of the right chest wall surrounding the chest tube.  IMPRESSION: 1. No change in right pleural effusion.   Electronically Signed   By: Kerby Moors M.D.   On: 08/29/2014 13:12   Dg Chest Port 1 View  08/28/2014   CLINICAL DATA:  Chest tube placement for effusion.  EXAM: PORTABLE CHEST - 1 VIEW  COMPARISON:  Study obtained earlier in the day  FINDINGS: There is a chest  tube on the right without demonstrable pneumothorax. Right effusion is smaller. There is a fairly small right effusion with right base consolidation and patchy atelectasis. Lungs elsewhere clear. There is no appreciable interstitial edema. The heart size and pulmonary vascularity are normal. There is atherosclerotic change in the aorta. No adenopathy. No bone lesions.  IMPRESSION: Chest tube in place on the right. No pneumothorax. Note that there is soft tissue air tracking along the chest tube in the lateral right hemithorax inferiorly. Right pleural effusion is smaller compared to earlier in the day. There is a residual right effusion with right base consolidation and patchy atelectasis.  There is no evidence of pulmonary edema. No change in cardiac silhouette.   Electronically Signed   By: Lowella Grip III M.D.   On: 08/28/2014 15:19   Dg Chest Port 1 View  08/28/2014   CLINICAL DATA:  Pleural effusion.  Pulmonary infection.  EXAM: PORTABLE CHEST - 1 VIEW  COMPARISON:  08/27/2014.  FINDINGS: Right chest tube in stable position. Mediastinum and hilar structures are normal. Cardiomegaly with normal pulmonary vascularity. Right lower lobe infiltrate consistent with pneumonia. Right pleural effusion. Right chest wall subcutaneous emphysema again noted.  IMPRESSION: 1.  Right chest tube in stable position.  No pneumothorax.  2. Persistent dense right lower lobe infiltrate and right pleural effusion. No interim change.   Electronically Signed   By: Marcello Moores  Register   On: 08/28/2014 08:15   Dg Chest Port 1 View  08/27/2014   CLINICAL DATA:  Chest tube placement.  EXAM: PORTABLE CHEST - 1 VIEW  COMPARISON:  Single view of the chest 08/26/2014.  FINDINGS: A new right chest tube is in place. Right pleural effusion and basilar airspace disease are again seen. No pneumothorax is identified. The left lung appears clear. Heart size is normal.  IMPRESSION: Status post right chest tube placement. Right pleural  effusion basilar airspace disease do not appear notably changed. No pneumothorax is seen.   Electronically Signed   By: Inge Rise M.D.   On: 08/27/2014 18:30   Dg Swallowing Func-speech Pathology  09/01/2014    Objective Swallowing Evaluation:    Patient Details  Name: Johnathan Strong MRN: [id number] Date of Birth: Sep 06, 1923  Today's Date: 09/01/2014 Time: SLP Start Time (ACUTE ONLY): 1245-SLP Stop Time (ACUTE ONLY): 1320 SLP Time Calculation (min) (ACUTE ONLY): 35 min  Past Medical History:  Past Medical History  Diagnosis Date  . Osteopenia 2006  . Hyperlipidemia   . BPH (benign prostatic hypertrophy)   . Transient global amnesia   . Osteoarthritis   . History of TIA (transient ischemic attack) 1980'S    NO RESIDUAL  . COPD (chronic obstructive pulmonary disease) with emphysema   . Chronic back pain   . Itching SECONDARY TO CLL-- CONTROLLED W/ SINGULAIR  . Frequency   . Nocturia   . Hypertension CARDIOLOGIST- DR BRACKBILL-- LAST VISIT 12-15-2010 NOTE IN  EPIC  . CLL (chronic lymphocytic leukemia) LAST PLT COUNT APRL 2012  168    Managed at [place]  . Bladder cancer   . Aspirin allergy     Eyes swell  . Asthma   . Dyspnea on exertion   . CAD (coronary artery disease)     a. 08/2012 Cath: LM nl, LAD 90p, LCX 20-30, RCA 100ost fills via L->R  collats, EF 55-65%;  b. 09/2012 PCI of prox LAD with 3.0x16 Promus DES.   Past Surgical History:  Past Surgical History  Procedure Laterality Date  . Excisional hemorrhoidectomy  1980  . Cardiovascular stress test  07/2005    a. 07/2005- no reversible ischemia, normal EF b. 06/2009- EF 73%, no  reversible ischemia, inferolateral TWIs at rest, upright in recovery c.  08/2012- mediuim-sized partially reversible basal to mid inferior and  inferoseptal perfusion defect c/w with prior infarction and peri-infarct  ischemia; EF 68% and borderline ST/T changes on stress ECG   . Transurethral resection of bladder tumor  10-19-2008    AND DILATION URETHRAL STRICTURE   . Cataract extraction w/ intraocular lens  implant, bilateral Bilateral ~  2007  . Transthoracic echocardiogram  01-03-2011    LVEF 01-09%, grade 1 diastolic dysfunction, no WMAs or structural  abnormalities  . Cystoscopy with biopsy  10/30/2011    Procedure: CYSTOSCOPY WITH BIOPSY;  Surgeon: Claybon Jabs, MD;   Location: [facility];  Service: Urology;  Laterality: N/A;   . Cardiac catheterization  08/27/2012  . Coronary angioplasty with stent placement  09/26/2012    "1" (09/26/2012)  . Coronary stent placement  09/26/12  . Left heart catheterization with coronary angiogram N/A 08/27/2012    Procedure: LEFT HEART CATHETERIZATION WITH CORONARY ANGIOGRAM;  Surgeon:  Peter M Martinique, MD;  Location: [facility] CATH LAB;  Service: Cardiovascular;   Laterality: N/A;  . Percutaneous coronary stent intervention (pci-s) N/A 09/26/2012    Procedure: PERCUTANEOUS CORONARY STENT INTERVENTION (PCI-S);  Surgeon:  Peter M Martinique, MD;  Location: [facility] CATH LAB;  Service: Cardiovascular;   Laterality: N/A;   HPI:  Other Pertinent Information: 81 y.o. male with past history of COPD,  paroxysmal A. fib on Eliquis, CAD, hypertension was recently discharged  from [facility] 6/9 after treatment for community-acquired  pneumonia. He presented to the ER where he was noted to have a white count  of 24K, chest x-ray with worsening pneumonia and moderate right pleural  effusion. Pt with chest tube.Previous admission for RLL PNA  with bedside  swallow completed 6/9: notes WFL oropharyngeal swallow with suspected  esophageal component.   No Data Recorded  Assessment / Plan / Recommendation CHL IP CLINICAL IMPRESSIONS 09/01/2014  Therapy Diagnosis Mild oral phase dysphagia;Suspected primary esophageal  dysphagia  Clinical Impression Pt demonstrates very mild oral dysphagia warranting  basic precautions to reduce risk of aspiration. The pt has trace premature  spillage and trace oral residuals after the swallow, both of which spill  to  the valleculae and pyriforms prior to the swallow with 1-2 instances of  trace penetration, expelled with a swallow. Oropharyngeal function is  otherwise WNL. An esophageal sweep does reveal stasis throughout the  distal to mid esophagus with upward movement of residual. The barium  tablet remained at the GE junction at the end of the test. Pt is  recommended to follow precautions to reduce risk of aspiration including  upright positioning, a second swallow and staying upright 30-60 minutes  after meal. Despite the precautions the pts highest risk is aspiration of  esophageal stasis after meals. Suggest f/u esophageal testing. SLP will  f/u for reinforcement of precautions.       CHL IP TREATMENT RECOMMENDATION 09/01/2014  Treatment Recommendations Therapy as outlined in treatment plan below     CHL IP DIET RECOMMENDATION 09/01/2014  SLP Diet Recommendations Age appropriate regular solids;Thin  Liquid Administration via (None)  Medication Administration Whole meds with liquid  Compensations Small sips/bites  Postural Changes and/or Swallow Maneuvers (None)     CHL IP OTHER RECOMMENDATIONS 09/01/2014  Recommended Consults Consider esophageal assessment;Consider GI evaluation   Oral Care Recommendations Oral care BID  Other Recommendations (None)     No flowsheet data found.   CHL IP FREQUENCY AND DURATION 09/01/2014  Speech Therapy Frequency (ACUTE ONLY) min 2x/week  Treatment Duration 1 week       No flowsheet data found.  No flowsheet data found.        Herbie [place], [place] CCC-SLP [phone]  DeBlois, Katherene Ponto 09/01/2014, 2:57 PM    Ct Image Guided Drainage By Percutaneous Catheter  09/01/2014   INDICATION: History of community acquired pneumonia, post right-sided chest tube placement with residual loculated fluid seen within the caudal aspect of the right pleural space. Request made for placement of additional CT-guided percutaneous chest tube for infection source control purposes.  EXAM: CT IMAGE GUIDED  DRAINAGE BY PERCUTANEOUS CATHETER  COMPARISON:  Chest CT- 08/10/2014; 08/25/2014; chest radiograph -  08/31/2014; ultrasound-guided right-sided thoracentesis - 08/26/2014  MEDICATIONS: The patient is currently admitted to the hospital and receiving intravenous antibiotics. The antibiotics were administered within an appropriate time frame prior to the initiation of the procedure.  ANESTHESIA/SEDATION: Fentanyl 25 mcg IV; Versed 5 mg IV  Total Moderate Sedation time  20 minutes  CONTRAST:  None  COMPLICATIONS: None immediate  PROCEDURE: Informed written consent was obtained from the patient after a discussion of the risks, benefits and alternatives to treatment. The patient was placed right lateral decubitus on the CT gantry and a pre procedural CT was performed re-demonstrating the known loculated fluid collection within the right pleural space. The procedure was planned. A timeout was performed prior to the initiation of the procedure.  The right inferior posterior lateral chest wall was prepped and draped in the usual sterile fashion. The overlying soft tissues were anesthetized with 1% lidocaine with epinephrine. Appropriate trajectory was planned with the use of a 22 gauge spinal needle. An 18 gauge trocar needle was advanced into the right pleural space and a short Amplatz super stiff wire was coiled within the collection. Appropriate positioning was confirmed with a limited CT scan. The tract was serially dilated allowing placement of a 12 [place] all-purpose drainage catheter. Appropriate positioning was confirmed with a limited postprocedural CT scan demonstrating the and of the percutaneous drainage catheter coiled and locked within the posterior medial aspect of the right pleural space (series 7).  Approximately 50 cc blood tinged pleural fluid was aspirated. The tube was connected to a pleural vac device and sutured in place. A dressing was placed. The patient tolerated the procedure well without immediate  post procedural complication.  IMPRESSION: Successful CT guided placement of a 8 French all purpose drain catheter into the caudal aspect of the right pleural space with aspiration of approximately 50 mL of blood tinged pleural fluid. Samples were sent to the laboratory as requested by the ordering clinical team.   Electronically Signed   By: Sandi Mariscal M.D.   On: 09/01/2014 16:50   US Thoracentesis Asp Pleural Space W/img Guide  08/26/2014   INDICATION: Symptomatic R sided pleural effusion  EXAM: US THORACENTESIS ASP PLEURAL SPACE W/IMG GUIDE  COMPARISON:  None.  MEDICATIONS: 10 cc 1% lidocaine  COMPLICATIONS: None immediate  TECHNIQUE: Informed written consent was obtained from the patient after a discussion of the risks, benefits and alternatives to treatment. A timeout was performed prior to the initiation of the procedure.  Initial ultrasound scanning demonstrates a R pleural effusion. The lower chest was prepped and draped in the usual sterile fashion. 1% lidocaine was used for local anesthesia.  Under direct ultrasound guidance, a 19 gauge, 7-cm, Yueh catheter was introduced. An ultrasound image was saved for documentation purposes. The thoracentesis was performed. The catheter was removed and a dressing was applied. The patient tolerated the procedure well without immediate post procedural complication. The patient was escorted to have an upright chest radiograph.  FINDINGS: A total of approximately 1 liters of bloody fluid was removed. Requested samples were sent to the laboratory.  IMPRESSION: Successful ultrasound-guided R sided thoracentesis yielding 1 liters of pleural fluid.  Read by:; Lavonia Drafts [facility]   Electronically Signed   By: Corrie Mckusick D.O.   On: 08/26/2014 15:53    ECG  Atrial fibrillation w/ RVR   ASSESSMENT AND PLAN  Principal Problem:   HCAP (healthcare-associated pneumonia) Active Problems:   CAD (coronary artery disease)   Atrial fibrillation   Chronic  lymphatic  leukemia   Pleural effusion on right   Sepsis   Pleural effusion   Pleural effusion, right   Blood poisoning   Chest tube in place   Pleural effusion associated with pulmonary infection   SOB (shortness of breath)   1. PAF: He has recurrent PAF in setting of severe lung disease. Rate is improved with CCB and b-blocker. VR currently 100-115. His CHA2DS2 VASc score is 6 for hypertension, age above 46, history of TIA, and vascular disease with prior coronary artery stent. However, we discussed anticoagulation with Critical Care. No anticoagulation at this time given chest tube.   2. HCAP: management per IM  3. Pleural Effusion: with chest tube. Management per Critical Care.   4. CAD: s/p DES to LAD in 2014. Continue BB and statin. Plavix on hold.    Janee Morn, PA-C 09/01/2014, 5:39 PM   Patient seen and examined with Lyda Jester, PA-C. We discussed all aspects of the encounter. I agree with the assessment and plan as stated above.   Mr. Forrester has recurrent PAF in setting of severe lung disease. Hi rate control is improved on current regimen. Given his level of illness I think a HR 100-115 is not unexpected. If HR > 120 can increase cardizem to 360 or lopressor to 37.5 tid. Suspect he will revert to NSR as he improves as he did last time. Will continue to hold anti-coagulation for now. Would restart Eliquis or heparin when possible. CAD is stable. We will follow.  D/w Marni Griffon, NP-C of CCM service.   Mashayla Lavin,MD 7:57 PM

## 2014-09-01 NOTE — Progress Notes (Addendum)
HR 120s-130s patient is now sleeping, remains in Atrial Fib. Notified Baltazar Najjar NP.   Orders received for PRN Metoprolol

## 2014-09-02 ENCOUNTER — Inpatient Hospital Stay (HOSPITAL_COMMUNITY): Payer: Medicare Other

## 2014-09-02 LAB — BASIC METABOLIC PANEL
ANION GAP: 10 (ref 5–15)
BUN: 29 mg/dL — AB (ref 6–20)
CALCIUM: 7.9 mg/dL — AB (ref 8.9–10.3)
CHLORIDE: 101 mmol/L (ref 101–111)
CO2: 21 mmol/L — ABNORMAL LOW (ref 22–32)
Creatinine, Ser: 1.21 mg/dL (ref 0.61–1.24)
GFR calc non Af Amer: 51 mL/min — ABNORMAL LOW (ref >60)
GFR, EST AFRICAN AMERICAN: 59 mL/min — AB (ref >60)
Glucose, Bld: 103 mg/dL — ABNORMAL HIGH (ref 65–99)
Potassium: 4.9 mmol/L (ref 3.5–5.1)
Sodium: 132 mmol/L — ABNORMAL LOW (ref 135–145)

## 2014-09-02 LAB — CBC
HCT: 26.6 % — ABNORMAL LOW (ref 39.0–52.0)
HEMOGLOBIN: 8.7 g/dL — AB (ref 13.0–17.0)
MCH: 29.4 pg (ref 26.0–34.0)
MCHC: 32.7 g/dL (ref 30.0–36.0)
MCV: 89.9 fL (ref 78.0–100.0)
Platelets: 230 10*3/uL (ref 150–400)
RBC: 2.96 MIL/uL — AB (ref 4.22–5.81)
RDW: 23.4 % — ABNORMAL HIGH (ref 11.5–15.5)
WBC: 16.5 10*3/uL — ABNORMAL HIGH (ref 4.0–10.5)

## 2014-09-02 LAB — GRAM STAIN

## 2014-09-02 LAB — OSMOLALITY, URINE: Osmolality, Ur: 586 mOsm/kg (ref 390–1090)

## 2014-09-02 MED ORDER — SODIUM CHLORIDE 0.9 % IV BOLUS (SEPSIS)
500.0000 mL | Freq: Once | INTRAVENOUS | Status: AC
  Administered 2014-09-02: 500 mL via INTRAVENOUS

## 2014-09-02 MED ORDER — DILTIAZEM HCL ER COATED BEADS 240 MG PO CP24
240.0000 mg | ORAL_CAPSULE | Freq: Every day | ORAL | Status: DC
  Administered 2014-09-03 – 2014-09-09 (×7): 240 mg via ORAL
  Filled 2014-09-02 (×7): qty 1

## 2014-09-02 MED ORDER — DILTIAZEM HCL ER COATED BEADS 180 MG PO CP24
300.0000 mg | ORAL_CAPSULE | Freq: Every day | ORAL | Status: DC
  Administered 2014-09-02: 300 mg via ORAL
  Filled 2014-09-02 (×2): qty 1

## 2014-09-02 NOTE — Progress Notes (Signed)
Patient Name: Johnathan Strong Date of Encounter: 09/02/2014  Primary Cardiologist: Dr. Mare Ferrari   Principal Problem:   HCAP (healthcare-associated pneumonia) Active Problems:   CAD (coronary artery disease)   Atrial fibrillation   Chronic lymphatic leukemia   Pleural effusion on right   Sepsis   Pleural effusion   Pleural effusion, right   Blood poisoning   Chest tube in place   Pleural effusion associated with pulmonary infection   SOB (shortness of breath)    SUBJECTIVE  Breathing getting better. R sided chest tube x 2 remain in place. Just got breathing treatment  CURRENT MEDS . atorvastatin  10 mg Oral q1800  . budesonide-formoterol  2 puff Inhalation BID  . calcium carbonate  1 tablet Oral BID  . cefTAZidime (FORTAZ)  IV  2 g Intravenous Q12H  . diltiazem  240 mg Oral Daily  . feeding supplement (ENSURE ENLIVE)  237 mL Oral TID BM  . metoprolol tartrate  25 mg Oral TID  . montelukast  10 mg Oral QHS  . polyethylene glycol  17 g Oral Daily  . senna-docusate  1 tablet Oral BID  . tiotropium  18 mcg Inhalation Daily    OBJECTIVE  Filed Vitals:   09/01/14 2102 09/01/14 2330 09/02/14 0400 09/02/14 0650  BP:  126/55  133/56  Pulse: 117 94  73  Temp:  97.9 F (36.6 C) 97.5 F (36.4 C)   TempSrc:  Oral Oral   Resp:  16  24  Height:      Weight:   165 lb 8 oz (75.07 kg)   SpO2:  98%  97%    Intake/Output Summary (Last 24 hours) at 09/02/14 0738 Last data filed at 09/02/14 0450  Gross per 24 hour  Intake      0 ml  Output    675 ml  Net   -675 ml   Filed Weights   08/31/14 0500 09/01/14 0445 09/02/14 0400  Weight: 168 lb 4.8 oz (76.34 kg) 171 lb 1.6 oz (77.61 kg) 165 lb 8 oz (75.07 kg)    PHYSICAL EXAM  General: Pleasant, NAD. Neuro: Alert and oriented X 3. Moves all extremities spontaneously. Psych: Normal affect. HEENT:  Normal  Neck: Supple without bruits or JVD. Lungs:  Resp regular and unlabored. No obvious wheezing, chest tubes in place  on R side, R basilar rale.  Heart: tachycardic. no s3, s4, or murmurs. Abdomen: Soft, non-tender, non-distended, BS + x 4.  Extremities: No clubbing, cyanosis or edema. DP/PT/Radials 2+ and equal bilaterally.  Accessory Clinical Findings  CBC  Recent Labs  09/01/14 0517 09/02/14 0316  WBC 20.1* 16.5*  HGB 9.4* 8.7*  HCT 27.9* 26.6*  MCV 87.2 89.9  PLT 246 751   Basic Metabolic Panel  Recent Labs  09/01/14 0517 09/02/14 0316  NA 128* 132*  K 4.7 4.9  CL 100* 101  CO2 23 21*  GLUCOSE 113* 103*  BUN 30* 29*  CREATININE 1.12 1.21  CALCIUM 7.9* 7.9*    TELE A-fib with HR high 90s-110s    ECG  No new EKG  Echocardiogram 04/22/2014  LV EF: 60% -  65%  ------------------------------------------------------------------- Indications:   Dyspnea 786.09.  ------------------------------------------------------------------- History:  PMH:  Coronary artery disease. Chronic obstructive pulmonary disease. Risk factors: Former tobacco use. Hypertension.  ------------------------------------------------------------------- Study Conclusions  - Left ventricle: The cavity size was normal. There was mild concentric hypertrophy. Systolic function was normal. The estimated ejection fraction was in the range of 60%  to 65%. Wall motion was normal; there were no regional wall motion abnormalities. Doppler parameters are consistent with abnormal left ventricular relaxation (grade 1 diastolic dysfunction). Doppler parameters are consistent with high ventricular filling pressure. - Mitral valve: Moderately calcified annulus. There was mild regurgitation. - Left atrium: The atrium was mildly dilated.    Radiology/Studies  Dg Chest 1 View  08/26/2014   CLINICAL DATA:  Post right thoracentesis, pleural effusion.  EXAM: CHEST  1 VIEW  COMPARISON:  08/25/2014  FINDINGS: Slight decreased size of the right effusion which remains moderate. No visible  pneumothorax. Continued right lower lobe atelectasis or infiltrate. Left lung is clear. Heart is normal size. No acute bony abnormality.  IMPRESSION: No pneumothorax following thoracentesis. Continued moderate right effusion with right lower lobe atelectasis or infiltrate.   Electronically Signed   By: Rolm Baptise M.D.   On: 08/26/2014 11:42   Dg Chest 2 View  08/31/2014   CLINICAL DATA:  Right pleural effusion with chest tube drainage, shortness of breath.  EXAM: CHEST  2 VIEW  COMPARISON:  Chest x-ray of June Jul 29, 2014 and chest CT scan of August 30, 2014.  FINDINGS: On the right there is mild volume loss and a moderate-sized pleural effusion. The right chest tube tip projects over the posterior aspect of the seventh rib and is unchanged. There is atelectasis or pneumonia at the right lung base which is stable. The left lung is well-expanded. There is a stable approximately 11 mm calcified nodule in the left lower lobe. There is no mediastinal shift. The cardiac silhouette is mildly enlarged. The pulmonary vascularity is normal. The bony thorax exhibits no acute abnormality.  IMPRESSION: Persistent moderate-sized inferior right pleural effusion. The tip of the right chest tube lies along the superior aspect of the fluid collection similar to the positioning described on the CT scan.  Mild stable enlargement of the cardiac silhouette without pulmonary edema.  Evidence of previous granulomatous infection.   Electronically Signed   By: David  Martinique M.D.   On: 08/31/2014 09:19   Dg Chest 2 View  08/25/2014   CLINICAL DATA:  81 year old male with shortness of breath and chest pain since last night. Pain radiating to the right shoulder with chills. Initial encounter. Recent diagnosis of pneumonia.  EXAM: CHEST  2 VIEW  COMPARISON:  08/13/2014 and earlier.  FINDINGS: Seated upright AP and lateral views of the chest. Continued moderate size right pleural effusion with dense right lung base opacification. Lower  lung volumes. No pneumothorax or pulmonary edema. Stable cardiac size and mediastinal contours. The left lung remains clear aside from evidence of a chronic lower lobe pulmonary nodule which has not significantly changed since 2013. Calcified atherosclerosis of the aorta again noted. No acute osseous abnormality identified.  IMPRESSION: 1. Continued moderate right pleural effusion with associated right lung base collapse/consolidation. 2. No new cardiopulmonary abnormality. Chronic left lower lobe lung nodule.   Electronically Signed   By: Genevie Ann M.D.   On: 08/25/2014 08:17   Dg Chest 2 View  08/13/2014   CLINICAL DATA:  Shortness of breath weakness pneumonia  EXAM: CHEST  2 VIEW  COMPARISON:  08/11/2014  FINDINGS: Moderate right effusion with underlying consolidation stable. Heart size and vascular pattern normal. No evidence of infiltrate on the left. Dense circumscribed oval 1 cm nodule to the left of the descending thoracic aorta stable from 02/26/2012 and therefore considered to be benign. This projects over the spine on the lateral radiograph and it  is likely a granuloma given its density and stability.  IMPRESSION: No change from prior study. Moderate right effusion and lower lobe consolidation.   Electronically Signed   By: Skipper Cliche M.D.   On: 08/13/2014 14:08   Dg Chest 2 View  08/11/2014   CLINICAL DATA:  Chest pain  EXAM: CHEST  2 VIEW  COMPARISON:  08/10/2014  FINDINGS: Heart is normal size. Consolidation in the right lower lobe concerning for pneumonia. Moderate right pleural effusion. Left lung is clear. No acute bony abnormality.  IMPRESSION: Continued right lower lobe consolidation and moderate right effusion, stable or slightly worsened since prior study.   Electronically Signed   By: Rolm Baptise M.D.   On: 08/11/2014 15:26   Dg Chest 2 View  08/10/2014   CLINICAL DATA:  Pneumonia, shortness of breath.  EXAM: CHEST  2 VIEW  COMPARISON:  PA and lateral chest x-ray of August 09, 2014   FINDINGS: The left lung is well-expanded. On the right there is persistent pleural fluid and increased basilar parenchymal density. The heart and pulmonary vascularity are normal. The mediastinum is normal in width. There is calcification in the wall of the thoracic aorta. There is mild gaseous distention of bowel under the left hemidiaphragm. The bony thorax exhibits no acute abnormality.  IMPRESSION: Persistent right lower lobe atelectasis or pneumonia and small right pleural effusion. There is likely underlying COPD. There has been interval development of minimal left basilar subsegmental atelectasis.   Electronically Signed   By: David  Martinique M.D.   On: 08/10/2014 07:53   Ct Chest Wo Contrast  08/30/2014   CLINICAL DATA:  Right pleural effusion and right-sided chest tube. Patient receiving intrapleural tPA.  EXAM: CT CHEST WITHOUT CONTRAST  TECHNIQUE: Multidetector CT imaging of the chest was performed following the standard protocol without IV contrast.  COMPARISON:  CT 08/25/2014 and chest x-ray 08/29/2014  FINDINGS: Right anterior lateral chest tube courses into the posterior pleural space at the level of the mid lungs as tip is within the most superior aspect of the pleural fluid collection. Minimal subcutaneous emphysema over the right flank. There is a moderate size right pleural effusion with mild interval improvement. There is continued compressive atelectasis over the right lower lobe and right middle lobes. Small collection of air within the pleural space over the anterior aspect of the pleural effusion near the entry site at the chest tube. Calcified granuloma over the left lower lobe. Minimal scarring over the posterior left lower lobe. Airways are within normal.  Mild stable cardiomegaly. Calcification of the mitral valve annulus. Moderate calcification of the left main and 3 vessel coronary arteries unchanged. Coronary stents present. Tiny amount of pericardial fluid present. Mild-to-moderate  calcified plaque over the thoracic aorta. No mediastinal, hilar or axillary adenopathy. Stable sub cm lymph node over the right retrocrural region at the level of the diaphragm.  Images through the upper abdomen are unchanged. There are degenerative changes of the spine.  IMPRESSION: Interval improvement in a moderate size right pleural fluid collection with associated compressive atelectasis over the right middle and lower lobes. Right-sided chest tube in with tip over the posterior pleural space in the most superior aspect of the pleural fluid collection.  Prior granulomas disease.  Atherosclerotic coronary artery disease with coronary stents present. Mild calcification of the mitral valve annulus. Mild cardiomegaly.   Electronically Signed   By: Marin Olp M.D.   On: 08/30/2014 09:31   Ct Chest W Contrast  08/25/2014  CLINICAL DATA:  Pleurisy and increase shortness of breath  EXAM: CT CHEST WITH CONTRAST  TECHNIQUE: Multidetector CT imaging of the chest was performed during intravenous contrast administration.  CONTRAST:  77mL OMNIPAQUE IOHEXOL 300 MG/ML  SOLN  COMPARISON:  None.  FINDINGS: THORACIC INLET/BODY WALL:  No acute abnormality.  MEDIASTINUM:  Normal heart size. No pericardial effusion. Extensive coronary atherosclerosis. No acute vascular abnormality. No adenopathy.  LUNG WINDOWS:  Large right pleural effusion which is water density. The fluid is predominantly free-flowing based on comparison to previous chest x-ray, but there is some complex features with scalloping of the lung margins. The right lower lobe and lateral segment right middle lobe are collapsed. There is no evidence of pleural nodularity or lung mass. No pneumonia is suspected.  Calcified granuloma in the left lower lobe.  UPPER ABDOMEN:  Partly visualized left renal cyst, imaged portions stable from 09/29/2008 abdominal CT  OSSEOUS:  No acute fracture.  No suspicious lytic or blastic lesions.  IMPRESSION: Large right pleural  effusion with lower lobe and middle lobe segmental collapse. The effusion is predominantly free-flowing based on prior chest x-ray, but there may be partial loculation where the lung is scalloped . No pleural or pulmonary cause is identified.   Electronically Signed   By: Monte Fantasia M.D.   On: 08/25/2014 15:09   Dg Chest Port 1 View  08/29/2014   CLINICAL DATA:  Evaluate pleural effusions  EXAM: PORTABLE CHEST - 1 VIEW  COMPARISON:  08/28/2014  FINDINGS: Right chest tube in place. No pneumothorax identified. The right-sided pleural effusion is unchanged in volume from previous exam. There is atelectasis in the right base. Gas is noted within the soft tissues of the right chest wall surrounding the chest tube.  IMPRESSION: 1. No change in right pleural effusion.   Electronically Signed   By: Kerby Moors M.D.   On: 08/29/2014 13:12   Dg Chest Port 1 View  08/28/2014   CLINICAL DATA:  Chest tube placement for effusion.  EXAM: PORTABLE CHEST - 1 VIEW  COMPARISON:  Study obtained earlier in the day  FINDINGS: There is a chest tube on the right without demonstrable pneumothorax. Right effusion is smaller. There is a fairly small right effusion with right base consolidation and patchy atelectasis. Lungs elsewhere clear. There is no appreciable interstitial edema. The heart size and pulmonary vascularity are normal. There is atherosclerotic change in the aorta. No adenopathy. No bone lesions.  IMPRESSION: Chest tube in place on the right. No pneumothorax. Note that there is soft tissue air tracking along the chest tube in the lateral right hemithorax inferiorly. Right pleural effusion is smaller compared to earlier in the day. There is a residual right effusion with right base consolidation and patchy atelectasis.  There is no evidence of pulmonary edema. No change in cardiac silhouette.   Electronically Signed   By: Lowella Grip III M.D.   On: 08/28/2014 15:19   Dg Chest Port 1 View  08/28/2014    CLINICAL DATA:  Pleural effusion.  Pulmonary infection.  EXAM: PORTABLE CHEST - 1 VIEW  COMPARISON:  08/27/2014.  FINDINGS: Right chest tube in stable position. Mediastinum and hilar structures are normal. Cardiomegaly with normal pulmonary vascularity. Right lower lobe infiltrate consistent with pneumonia. Right pleural effusion. Right chest wall subcutaneous emphysema again noted.  IMPRESSION: 1.  Right chest tube in stable position.  No pneumothorax.  2. Persistent dense right lower lobe infiltrate and right pleural effusion. No interim change.  Electronically Signed   By: Marcello Moores  Register   On: 08/28/2014 08:15   Dg Chest Port 1 View  08/27/2014   CLINICAL DATA:  Chest tube placement.  EXAM: PORTABLE CHEST - 1 VIEW  COMPARISON:  Single view of the chest 08/26/2014.  FINDINGS: A new right chest tube is in place. Right pleural effusion and basilar airspace disease are again seen. No pneumothorax is identified. The left lung appears clear. Heart size is normal.  IMPRESSION: Status post right chest tube placement. Right pleural effusion basilar airspace disease do not appear notably changed. No pneumothorax is seen.   Electronically Signed   By: Inge Rise M.D.   On: 08/27/2014 18:30   Dg Swallowing Func-speech Pathology  09/01/2014    Objective Swallowing Evaluation:    Patient Details  Name: Johnathan Strong MRN: [id number] Date of Birth: 12/28/1923  Today's Date: 09/01/2014 Time: SLP Start Time (ACUTE ONLY): 1245-SLP Stop Time (ACUTE ONLY): 1320 SLP Time Calculation (min) (ACUTE ONLY): 35 min  Past Medical History:  Past Medical History  Diagnosis Date  . Osteopenia 2006  . Hyperlipidemia   . BPH (benign prostatic hypertrophy)   . Transient global amnesia   . Osteoarthritis   . History of TIA (transient ischemic attack) 1980'S    NO RESIDUAL  . COPD (chronic obstructive pulmonary disease) with emphysema   . Chronic back pain   . Itching SECONDARY TO CLL-- CONTROLLED W/ SINGULAIR  . Frequency   . Nocturia    . Hypertension CARDIOLOGIST- DR BRACKBILL-- LAST VISIT 12-15-2010 NOTE IN  EPIC  . CLL (chronic lymphocytic leukemia) LAST PLT COUNT APRL 2012  168    Managed at [place]  . Bladder cancer   . Aspirin allergy     Eyes swell  . Asthma   . Dyspnea on exertion   . CAD (coronary artery disease)     a. 08/2012 Cath: LM nl, LAD 90p, LCX 20-30, RCA 100ost fills via L->R  collats, EF 55-65%;  b. 09/2012 PCI of prox LAD with 3.0x16 Promus DES.   Past Surgical History:  Past Surgical History  Procedure Laterality Date  . Excisional hemorrhoidectomy  1980  . Cardiovascular stress test  07/2005    a. 07/2005- no reversible ischemia, normal EF b. 06/2009- EF 73%, no  reversible ischemia, inferolateral TWIs at rest, upright in recovery c.  08/2012- mediuim-sized partially reversible basal to mid inferior and  inferoseptal perfusion defect c/w with prior infarction and peri-infarct  ischemia; EF 68% and borderline ST/T changes on stress ECG   . Transurethral resection of bladder tumor  10-19-2008    AND DILATION URETHRAL STRICTURE  . Cataract extraction w/ intraocular lens  implant, bilateral Bilateral ~  2007  . Transthoracic echocardiogram  01-03-2011    LVEF 76-28%, grade 1 diastolic dysfunction, no WMAs or structural  abnormalities  . Cystoscopy with biopsy  10/30/2011    Procedure: CYSTOSCOPY WITH BIOPSY;  Surgeon: Claybon Jabs, MD;   Location: [facility];  Service: Urology;  Laterality: N/A;   . Cardiac catheterization  08/27/2012  . Coronary angioplasty with stent placement  09/26/2012    "1" (09/26/2012)  . Coronary stent placement  09/26/12  . Left heart catheterization with coronary angiogram N/A 08/27/2012    Procedure: LEFT HEART CATHETERIZATION WITH CORONARY ANGIOGRAM;  Surgeon:  Peter M Martinique, MD;  Location: [facility] CATH LAB;  Service: Cardiovascular;   Laterality: N/A;  . Percutaneous coronary stent intervention (pci-s) N/A  09/26/2012    Procedure: PERCUTANEOUS CORONARY STENT INTERVENTION  (PCI-S);  Surgeon:  Peter M Martinique, MD;  Location: [facility] CATH LAB;  Service: Cardiovascular;   Laterality: N/A;   HPI:  Other Pertinent Information: 81 y.o. male with past history of COPD,  paroxysmal A. fib on Eliquis, CAD, hypertension was recently discharged  from [facility] 6/9 after treatment for community-acquired  pneumonia. He presented to the ER where he was noted to have a white count  of 24K, chest x-ray with worsening pneumonia and moderate right pleural  effusion. Pt with chest tube.Previous admission for RLL PNA  with bedside  swallow completed 6/9: notes WFL oropharyngeal swallow with suspected  esophageal component.   No Data Recorded  Assessment / Plan / Recommendation CHL IP CLINICAL IMPRESSIONS 09/01/2014  Therapy Diagnosis Mild oral phase dysphagia;Suspected primary esophageal  dysphagia  Clinical Impression Pt demonstrates very mild oral dysphagia warranting  basic precautions to reduce risk of aspiration. The pt has trace premature  spillage and trace oral residuals after the swallow, both of which spill  to the valleculae and pyriforms prior to the swallow with 1-2 instances of  trace penetration, expelled with a swallow. Oropharyngeal function is  otherwise WNL. An esophageal sweep does reveal stasis throughout the  distal to mid esophagus with upward movement of residual. The barium  tablet remained at the GE junction at the end of the test. Pt is  recommended to follow precautions to reduce risk of aspiration including  upright positioning, a second swallow and staying upright 30-60 minutes  after meal. Despite the precautions the pts highest risk is aspiration of  esophageal stasis after meals. Suggest f/u esophageal testing. SLP will  f/u for reinforcement of precautions.       CHL IP TREATMENT RECOMMENDATION 09/01/2014  Treatment Recommendations Therapy as outlined in treatment plan below     CHL IP DIET RECOMMENDATION 09/01/2014  SLP Diet Recommendations Age appropriate regular  solids;Thin  Liquid Administration via (None)  Medication Administration Whole meds with liquid  Compensations Small sips/bites  Postural Changes and/or Swallow Maneuvers (None)     CHL IP OTHER RECOMMENDATIONS 09/01/2014  Recommended Consults Consider esophageal assessment;Consider GI evaluation   Oral Care Recommendations Oral care BID  Other Recommendations (None)     No flowsheet data found.   CHL IP FREQUENCY AND DURATION 09/01/2014  Speech Therapy Frequency (ACUTE ONLY) min 2x/week  Treatment Duration 1 week       No flowsheet data found.  No flowsheet data found.        Herbie [place], [place] CCC-SLP [phone]  DeBlois, Katherene Ponto 09/01/2014, 2:57 PM    Ct Image Guided Drainage By Percutaneous Catheter  09/01/2014   INDICATION: History of community acquired pneumonia, post right-sided chest tube placement with residual loculated fluid seen within the caudal aspect of the right pleural space. Request made for placement of additional CT-guided percutaneous chest tube for infection source control purposes.  EXAM: CT IMAGE GUIDED DRAINAGE BY PERCUTANEOUS CATHETER  COMPARISON:  Chest CT- 08/10/2014; 08/25/2014; chest radiograph - 08/31/2014; ultrasound-guided right-sided thoracentesis - 08/26/2014  MEDICATIONS: The patient is currently admitted to the hospital and receiving intravenous antibiotics. The antibiotics were administered within an appropriate time frame prior to the initiation of the procedure.  ANESTHESIA/SEDATION: Fentanyl 25 mcg IV; Versed 5 mg IV  Total Moderate Sedation time  20 minutes  CONTRAST:  None  COMPLICATIONS: None immediate  PROCEDURE: Informed written consent was obtained from the patient after a discussion of  the risks, benefits and alternatives to treatment. The patient was placed right lateral decubitus on the CT gantry and a pre procedural CT was performed re-demonstrating the known loculated fluid collection within the right pleural space. The procedure was planned. A timeout was  performed prior to the initiation of the procedure.  The right inferior posterior lateral chest wall was prepped and draped in the usual sterile fashion. The overlying soft tissues were anesthetized with 1% lidocaine with epinephrine. Appropriate trajectory was planned with the use of a 22 gauge spinal needle. An 18 gauge trocar needle was advanced into the right pleural space and a short Amplatz super stiff wire was coiled within the collection. Appropriate positioning was confirmed with a limited CT scan. The tract was serially dilated allowing placement of a 12 [place] all-purpose drainage catheter. Appropriate positioning was confirmed with a limited postprocedural CT scan demonstrating the and of the percutaneous drainage catheter coiled and locked within the posterior medial aspect of the right pleural space (series 7).  Approximately 50 cc blood tinged pleural fluid was aspirated. The tube was connected to a pleural vac device and sutured in place. A dressing was placed. The patient tolerated the procedure well without immediate post procedural complication.  IMPRESSION: Successful CT guided placement of a 70 French all purpose drain catheter into the caudal aspect of the right pleural space with aspiration of approximately 50 mL of blood tinged pleural fluid. Samples were sent to the laboratory as requested by the ordering clinical team.   Electronically Signed   By: Sandi Mariscal M.D.   On: 09/01/2014 16:50   US Thoracentesis Asp Pleural Space W/img Guide  08/26/2014   INDICATION: Symptomatic R sided pleural effusion  EXAM: US THORACENTESIS ASP PLEURAL SPACE W/IMG GUIDE  COMPARISON:  None.  MEDICATIONS: 10 cc 1% lidocaine  COMPLICATIONS: None immediate  TECHNIQUE: Informed written consent was obtained from the patient after a discussion of the risks, benefits and alternatives to treatment. A timeout was performed prior to the initiation of the procedure.  Initial ultrasound scanning demonstrates a R pleural  effusion. The lower chest was prepped and draped in the usual sterile fashion. 1% lidocaine was used for local anesthesia.  Under direct ultrasound guidance, a 19 gauge, 7-cm, Yueh catheter was introduced. An ultrasound image was saved for documentation purposes. The thoracentesis was performed. The catheter was removed and a dressing was applied. The patient tolerated the procedure well without immediate post procedural complication. The patient was escorted to have an upright chest radiograph.  FINDINGS: A total of approximately 1 liters of bloody fluid was removed. Requested samples were sent to the laboratory.  IMPRESSION: Successful ultrasound-guided R sided thoracentesis yielding 1 liters of pleural fluid.  Read by:; Lavonia Drafts [facility]   Electronically Signed   By: Corrie Mckusick D.O.   On: 08/26/2014 15:53    ASSESSMENT AND PLAN  1. Recurrent proxysmal atrial firbillation in the setting of severe lung dx  - CHA2DS12-Vasc score 6 (hypertension, age above 38, history of TIA, and vascular disease with prior coronary artery stent)  - eliquis on hold given chest tube. Previously went into PAF during last admission for PNA in early June, reverted back to NSR as he recovered.  - HR high 90s to 100s yesterday, this morning went up to 130s, now back down to 110s. Continue metoprolol 25mg  TID and diltiazem, increase diltiazem to 300mg  daily. SBP 90-130s overnight.   2. HCAP: management per IM  3. Pleural effusion s/p chest  tube  4. CAD s/p DES to LAD in 2014, also noted to have ostial RCA occlusion with good L to R collateral  - continue BB and statin, plavix on hold.  Not on ASA given systemic anticoagulation need  5. COPD 6. CLL followed at [facility], Almyra Deforest PA-C Pager: [phone]  Personally seen and examined. Agree with above. AFIB - trying to improve rate Await restart of anticoagulation per CCM. Chest tube  Candee Furbish, MD

## 2014-09-02 NOTE — Progress Notes (Signed)
PULMONARY/CCM NOTE  Requesting MD/Service: August Saucer Date of admission: 6/21 Date of consult: 6/22 Reason for consultation:  parapneumonic effusion  Pt Profile:  56 M admitted to [facility] 6/04 - 6/09 with CAP. CXR on 6/09 revealed R effusion. He failed to recover as anticipated after discharge and readmitted 6/21 with large R effusion. Thoracentesis performed 6/22 revealed exudative characteristics (LDH 341, protein 3.7, with > 10,000 WBC, 93% neutrophils). Fluid was described as bloody. Pulm medicine asked to assist in further eval and mgmt of parapneumonic effusion   Significant tests/ events 6/23  A. fib RVR- transferred to stepdown unit 6/23 [place] of right chest - marked fibrinous tissue and concern for loculation t/o the right chest wall Rt chest tube 6/23 >>TPA 6/24: 1200 ml output, on 6/25 and 6/26 minimal output.  CT chest 6/26: Interval improvement in a moderate size right pleural fluid collection with associated compressive atelectasis over the right middle and lower lobes. Right-sided chest tube in with tip over the posterior pleural space in the most superior aspect of the pleural fluid collection. 6/28: posterior 12 fr chest tube placed in IR 6/28: pleural fluid culture>>> 6/29: right lateral 32 fr CT removed.    Subjective- no sob currently, feeling better.    Filed Vitals:   09/02/14 0650 09/02/14 0730 09/02/14 0745 09/02/14 0826  BP: 133/56 111/63    Pulse: 73 74    Temp:      TempSrc:      Resp: 24 29    Height:      Weight:      SpO2: 97% 97% 99% 98%  room air   EXAM:  Gen: WDWN, able to speak in full paragraphs HEENT:increaed while on side Neck: no LAN Lungs: decreased RLL CT lateral 70fr w/ no output or air leak prior to d/c. Posterior 12 fr pigtail: no air leak, 350 ml output since insertion 6/28 Cardiovascular: IRIR, no M noted Abdomen: soft, NT, +BS Ext: no C/C/E Neuro: awake, alert  DATA:  BMET    Component Value Date/Time   NA 132* 09/02/2014 0316    NA 144 04/03/2014 1023   K 4.9 09/02/2014 0316   CL 101 09/02/2014 0316   CO2 21* 09/02/2014 0316   GLUCOSE 103* 09/02/2014 0316   GLUCOSE 94 04/03/2014 1023   BUN 29* 09/02/2014 0316   BUN 16 04/03/2014 1023   CREATININE 1.21 09/02/2014 0316   CALCIUM 7.9* 09/02/2014 0316   GFRNONAA 51* 09/02/2014 0316   GFRAA 59* 09/02/2014 0316    CBC    Component Value Date/Time   WBC 16.5* 09/02/2014 0316   WBC 9.5 04/03/2014 1038   RBC 2.96* 09/02/2014 0316   RBC 4.2* 04/03/2014 1038   HGB 8.7* 09/02/2014 0316   HGB 12.0* 04/03/2014 1038   HCT 26.6* 09/02/2014 0316   HCT 40.0* 04/03/2014 1038   PLT 230 09/02/2014 0316   MCV 89.9 09/02/2014 0316   MCV 95.4 04/03/2014 1038   MCH 29.4 09/02/2014 0316   MCH 28.6 04/03/2014 1038   MCHC 32.7 09/02/2014 0316   MCHC 30.0* 04/03/2014 1038   RDW 23.4* 09/02/2014 0316   LYMPHSABS 5.6* 08/25/2014 0700   MONOABS 1.0 08/25/2014 0700   EOSABS 0.0 08/25/2014 0700   BASOSABS 0.0 08/25/2014 0700    Chest tube- minimal  CT scan 6/26- images reviewed pending Radiology report. There is residual loculation. Tube placement ok.  CXR w/ slight improved aeration   IMPRESSION:   1) HCAP (healthcare-associated pneumonia) 2) Chronic lymphatic leukemia  3) R  effusion  parapneumonic, complex.  >D/c'd lateral 32 fr CT today 6/29 as no output for 24 hrs. 12 fr Pigtail placed 6/29. 344ml blood tinged Pleural fluid. CT still w/ slow output. CXR and clinical status cont to slowly improve.  >Leukocytosis trending down PLAN:  F/u posterior pigtail output Think we can probably for-go repeat TPA in the posterior CT but will d/w Titus Mould  Likely d/c posterior CT in next 48hrs F/u pending cultures Cont fortaz for now. Should be able to transition to oral soon. Will d/w team course of therapy.  Mobilize  AF w/ RVR, anemia, hyponatremia  Plan Per IM and cards service.   Erick Colace ACNP-BC [place] Pager # [phone] OR #  [phone] if no answer     PCCM ATTENDING: I have reviewed pt's initial presentation, consultants notes and hospital database in detail.  The above assessment and plan was formulated under my direction. Daily CXR until pleural catheter is out  Merton Border, MD;  PCCM service; Mobile [phone]

## 2014-09-02 NOTE — Progress Notes (Addendum)
Occupational Therapy Treatment Patient Details Name: Johnathan Strong MRN: [id number] DOB: [birth year] Today's Date: 09/02/2014    History of present illness 81 y.o. male with past history of COPD, paroxysmal A. fib on Eliquis, CAD, hypertension was recently discharged from [facility] 6/9 after treatment for community-acquired pneumonia. He presented to the ER where he was noted to have a white count of 24K, chest x-ray with worsening pneumonia and moderate right pleural effusion. Pt with chest tube.   OT comments  Pt progressing. Pt performed ADLs. HR increased up to 150 in session. Notified nurse of HR and O2 sats. Continue to recommend [facility] upon d/c.   Follow Up Recommendations  Home health OT;Supervision/Assistance - 24 hour    Equipment Recommendations  3 in 1 bedside comode;Other (comment) (RW)    Recommendations for Other Services      Precautions / Restrictions Precautions Precautions: Fall Precaution Comments: chest tube; watch HR and O2 sats Restrictions Weight Bearing Restrictions: No       Mobility Bed Mobility               General bed mobility comments: not assessed  Transfers Overall transfer level: Needs assistance   Transfers: Sit to/from Stand Sit to Stand: Min assist         General transfer comment: assist to boost.     Balance  Min assist for balance when standing from chair and pt washing bottom.                                 ADL Overall ADL's : Needs assistance/impaired Eating/Feeding: Independent;Sitting   Grooming: Wash/dry face;Sitting;Set up;Supervision/safety (shaving face)   Upper Body Bathing: Set up;Supervision/ safety;Sitting   Lower Body Bathing: Minimal assistance;Sit to/from stand Lower Body Bathing Details (indicate cue type and reason): assist for balance  Upper Body Dressing : Set up;Supervision/safety;Sitting   Lower Body Dressing: Minimal assistance;Sitting/lateral leans (donned/doffed  socks)   Toilet Transfer: Minimal assistance;RW (sit to stand from recliner)             General ADL Comments: Educated on energy conservation techniques. Educated on deep breathing technique.  Talked with daughter about vitals and cueing pt for energy conservation as well as benefit of pt performing ADLs. Pt became SOB in session-breaks taken.      Vision                     Perception     Praxis      Cognition  Awake/Alert Behavior During Therapy: WFL for tasks assessed/performed Overall Cognitive Status: Within Functional Limits for tasks assessed                       Extremity/Trunk Assessment               Exercises     Shoulder Instructions       General Comments      Pertinent Vitals/ Pain       Pain Assessment: 0-10 Pain Score: 3-4  Pain Location: upper back where tube is placed and right shoulder Pain Descriptors/Indicators: Discomfort Pain Intervention(s): Monitored during session; Repositioned  Pt on O2 in session and O2 sats reading in 70s-80s at times (unsure of accuracy-pt had pulse oximeter on his ear). Cues for deep breathing technique. O2 up to 90s at end of session. HR up to 150 in session (reading AFIB on monitor).  Took breaks during session. Notified nurse of O2 sats and HR. OT's pulse oximeter not reading, but was tried.   Home Living                                          Prior Functioning/Environment              Frequency Min 2X/week     Progress Toward Goals  OT Goals(current goals can now be found in the care plan section)  Progress towards OT goals: Progressing toward goals  Acute Rehab OT Goals Patient Stated Goal: not stated OT Goal Formulation: With patient Time For Goal Achievement: 09/07/14 Potential to Achieve Goals: Good ADL Goals Pt Will Perform Upper Body Bathing: with set-up;sitting;standing;with supervision Pt Will Perform Lower Body Bathing: with set-up;with  supervision;sit to/from stand Pt Will Perform Lower Body Dressing: with set-up;with supervision;sit to/from stand Pt Will Transfer to Toilet: with supervision;ambulating (elevated toilet) Pt Will Perform Toileting - Clothing Manipulation and hygiene: sit to/from stand;with supervision Additional ADL Goal #1: Pt will independently verbalize 3/3 energy conservation techniques and utilize during session.  Plan Discharge plan remains appropriate    Co-evaluation                 End of Session Equipment Utilized During Treatment: Gait belt;Rolling walker;Oxygen   Activity Tolerance Other (comment) (increased HR)   Patient Left in chair;with call bell/phone within reach;with family/visitor present   Nurse Communication Other (comment) (bathed off; O2 sats and HR; pulse oximeter reader came off)        Time: 0347-4259 OT Time Calculation (min): 32 min  Charges: OT General Charges $OT Visit: 1 Procedure OT Treatments $Self Care/Home Management : 23-37 mins   Benito Mccreedy OTR/L [phone] 09/02/2014, 4:46 PM

## 2014-09-02 NOTE — Progress Notes (Signed)
Physical Therapy Treatment Patient Details Name: Johnathan Strong MRN: [id number] DOB: Nov 02, 1923 Today's Date: 09/02/2014    History of Present Illness 81 y.o. male with past history of COPD, paroxysmal A. fib on Eliquis, CAD, hypertension was recently discharged from [facility] 6/9 after treatment for community-acquired pneumonia. He presented to the ER where he was noted to have a white count of 24K, chest x-ray with worsening pneumonia and moderate right pleural effusion. Pt with chest tube.    PT Comments    Pt has to take frequent rest breaks during ambulation for HR to decrease but was able to tolerate 180' total with RW and min-guard A. He was very encouraged to be able to do this. PT will continue to follow.   Follow Up Recommendations  Home health PT;Supervision for mobility/OOB     Equipment Recommendations  Rolling walker with 5" wheels    Recommendations for Other Services       Precautions / Restrictions Precautions Precautions: Fall Precaution Comments: chest tube; watch HR and O2 sats Restrictions Weight Bearing Restrictions: No    Mobility  Bed Mobility               General bed mobility comments: pt received in chair  Transfers Overall transfer level: Needs assistance Equipment used: Rolling walker (2 wheeled) Transfers: Sit to/from Stand Sit to Stand: Min guard         General transfer comment: pt stood with min-guard for safety but did not require physical assist from chair with hands on arm rests  Ambulation/Gait Ambulation/Gait assistance: Min guard Ambulation Distance (Feet): 160 Feet Assistive device: Rolling walker (2 wheeled) Gait Pattern/deviations: Step-through pattern;Decreased stride length Gait velocity: purposefully decreased Gait velocity interpretation: <1.8 ft/sec, indicative of risk for recurrent falls General Gait Details: kept pace slow to control HR and used RW for energy conservation, chair brought behind for  safety. Standing rest break every 20-25' for 30s-29min for HR to decrease from 140s to 110s. Pt tolerated this well   Stairs            Wheelchair Mobility    Modified Rankin (Stroke Patients Only)       Balance Overall balance assessment: No apparent balance deficits (not formally assessed)                                  Cognition Arousal/Alertness: Awake/alert Behavior During Therapy: WFL for tasks assessed/performed Overall Cognitive Status: Within Functional Limits for tasks assessed                      Exercises Total Joint Exercises Long Arc Quad: AROM;Both;10 reps;Seated General Exercises - Lower Extremity Ankle Circles/Pumps: AROM;Both;10 reps Other Exercises Other Exercises: standing scapular retraction Other Exercises: standing shoulder shrugs Other Exercises: standing shoulder flexion and abduction    General Comments General comments (skin integrity, edema, etc.): O2 sats 97% on 2L O2      Pertinent Vitals/Pain Pain Assessment: No/denies pain Pain Score: 3  Pain Location: upper back where tube is placed and right shoulder Pain Intervention(s): Monitored during session    Home Living                      Prior Function            PT Goals (current goals can now be found in the care plan section) Acute Rehab PT Goals Patient  Stated Goal: get better PT Goal Formulation: With patient Time For Goal Achievement: 09/07/14 Potential to Achieve Goals: Good Progress towards PT goals: Progressing toward goals    Frequency  Min 3X/week    PT Plan Current plan remains appropriate    Co-evaluation             End of Session Equipment Utilized During Treatment: Gait belt;Oxygen Activity Tolerance: Patient tolerated treatment well Patient left: in chair;with call bell/phone within reach;with family/visitor present     Time: 9357-0177 PT Time Calculation (min) (ACUTE ONLY): 35 min  Charges:  $Gait  Training: 23-37 mins                    G Codes:     Leighton Roach, PT  Acute Rehab Services  [phone]  Leighton Roach 09/02/2014, 4:54 PM

## 2014-09-02 NOTE — Progress Notes (Signed)
Speech Language Pathology Treatment:    Patient Details Name: Johnathan Strong MRN: [id number] DOB: 1923-12-20 Today's Date: 09/02/2014 Time: 2595-6387 SLP Time Calculation (min) (ACUTE ONLY): 19 min  Assessment / Plan / Recommendation Clinical Impression  Discussed findings of possible esophageal dysphagia with NP and pt and family. Reviewed esophageal precautions and strategies. Pt is tolerating diet and he and his family understand precautions and will f/u with GI when appropriate. No SLP f/u needed. Will sign off.    HPI Other Pertinent Information: 81 y.o. male with past history of COPD, paroxysmal A. fib on Eliquis, CAD, hypertension was recently discharged from [facility] 6/9 after treatment for community-acquired pneumonia. He presented to the ER where he was noted to have a white count of 24K, chest x-ray with worsening pneumonia and moderate right pleural effusion. Pt with chest tube.Previous admission for RLL PNA  with bedside swallow completed 6/9: notes WFL oropharyngeal swallow with suspected esophageal component.    Pertinent Vitals    SLP Plan  All goals met    Recommendations Diet recommendations: Regular;Thin liquid Liquids provided via: Cup;Straw Medication Administration: Whole meds with liquid Supervision: Patient able to self feed Compensations: Small sips/bites;Multiple dry swallows after each bite/sip Postural Changes and/or Swallow Maneuvers: Seated upright 90 degrees;Upright 30-60 min after meal              Oral Care Recommendations: Oral care BID Plan: All goals met    GO    Herbie Baltimore, MA CCC-SLP [phone]  Lynann Beaver 09/02/2014, 10:59 AM

## 2014-09-02 NOTE — Progress Notes (Signed)
PT Cancellation Note  Patient Details Name: Johnathan Strong MRN: [id number] DOB: 1923/10/22   Cancelled Treatment:    Reason Eval/Treat Not Completed: Other (comment), checked on pt in AM and was working with SLP, checked back in PM and was working with OT. Will check back as time allows.   [place], [place] 09/02/2014, 3:08 PM

## 2014-09-02 NOTE — Progress Notes (Signed)
[facility] PROGRESS NOTE  Johnathan SZYMBORSKI NUU:[id number] DOB: 08-12-1923 DOA: 08/25/2014 PCP: Redge Gainer, MD  Interim Summary Patient is a pleasant 81 year old gentleman with a past medical history of CML, recently discharged from the medicine service on 08/19/2014, admitted on 08/09/2014 during which he was treated for community-acquired pneumonia. Patient showed gradual improvement as he was weaned off of supplemental oxygen, tolerating by mouth intake, and was discharged on Ceftin. His wife reporting that patient did well the week after discharge, got back to his normal routine, then had a decline this past Monday, developing fevers, chills, night sweats. Chest x-ray performed on admission revealed moderate right pleural effusion with associated right lung base consolidation/collapse. He was started on broad-spectrum IV antibiotic therapy with vancomycin and ceftazidime, undergoing ultrasound-guided thoracentesis by interventional radiology on 08/26/2014. Pulmonary critical care medicine was consulted regarding pleural effusion as it was felt he would benefit chest tube. PCCM placed a 32 French chest tube on 08/27/2014 (rather than wayne cath) since there was marked fibrinous tissue and concern for loculation. Subsequent imagining showed incomplete drainage for which TPA was injected into pleural space. This seemed to help initially however subsequent injections did not yield significant fluid. PCCM recommended proceeding with IR consult for PIG-tail catheter, which was performed on 09/01/2014. Hospitalization was complicated by the moment of A. fib with RVR on 08/27/2014 likely precipitated by underlying pulmonary infectious process and acute hypoxemic respiratory failure. Controlling Afib has been a challenge. Dr Coralyn Pear consulted cardiology on 09/01/2014.   Assessment/Plan: #1 healthcare associated pneumonia with probable parapneumonic effusion Patient recently discharged on 08/19/2014 from the  hospital after being treated for community-acquired pneumonia who presented with a probable parapneumonic effusion. Patient underwent ultrasound-guided thoracentesis by interventional radiology with a liter of yellow colored fluid removed with a cell count of 10,299. Thoracentesis was consistent with exudative characteristics. No organisms seen on Gram stain. Blood cultures pending. Afebrile. WBC trending down. Patient also had chest tube placed on 08/27/2014 with 1200 mL output in all 625 and 626 minimal output. On 09/01/2014 posterior 12 French chest tube was placed per IR. 09/02/2014 patient had right lateral chest tube removed per critical care. Patient is afebrile. WBC trending down. Treatment options were discussed with patient and family including drain catheter versus repeat thoracentesis. VATS procedure was felt not to be well tolerated due to his multiple comorbidities, advanced age, A. fib with RVR. Continue empiric IV Fortaz. Pulmonary medicine gave TPA injections into pleural space due to amount of fibrous material and incomplete fluid drainage from the chest tube. Patient did have a repeat chest CT that showed interval improvement in right-sided pleural effusion. Patient was evaluated by Dr. Roxan Hockey of cardiothoracic surgery who did not recommend a VATS procedure. Patient had pigtail catheter placement done on 09/01/2014 per interventional radiology. Clinical improvement. Continue current therapy. Pulmonary critical care following.  #2 acute hypoxemic respiratory failure Likely multifactorial secondary to problem #1. Improving.  #3 A. fib with RVR CHADS2VASC score 6. Patient was noted to have heart rates in the 120s to 140s yesterday. Felt to be secondary to patient's pulmonary process. Patient has been seen in consultation by cardiology were adjusting patient's rate controlling medications. Continue metoprolol 25 mg 3 times a day. Diltiazem has been increased to 300 mg daily per  cardiology. Pulmonary to advise when anticoagulation may be resumed.  #4 coronary artery disease status post DES to the LAD in 2014 Stable. Continue beta blocker, statin. Plavix on hold. Per cardiology.  #5 sepsis Noted on admission  as patient had a white count of 24.2, respiratory rate of 33 heart rate of 107 with pulse of infection being healthcare associated pneumonia. Blood cultures pending with no growth to date. Pleural fluid cultures pending. Continue empiric IV Fortaz.  #6 hypertension Continue metoprolol and Cardizem. Cozaar on hold.  #7 acute kidney injury Improvement. Stable. Follow.  #8 CLL Outpatient follow-up.  #9 prophylaxis SCDs for DVT prophylaxis.  Code Status: Full Family Communication: Updated patient, wife and family at bedside. Disposition Plan: Remain inpatient  Consultants:  Cardiology: Dr. Haroldine Laws 09/01/2014  CT surgery Dr. Roxan Hockey 08/28/2014  Critical care medicine Dr. Alva Garnet 08/26/2014  Procedures:  Ultrasound-guided thoracentesis 08/26/2014  CT guided drainage catheter placement into caudal aspect of right pleural space per Dr. Pascal Lux 09/01/2014  Right chest tube placement 08/27/2014  CT chest 08/30/2014, 08/25/2014  Antibiotics:  IV Tressie Ellis 08/25/2014  IV vancomycin 08/25/2014>>>> 09/01/2014  HPI/Subjective: Patient states breathing has improved. Patient denies any chest pain. Patient states he's feeling better.  Objective: Filed Vitals:   09/02/14 1010  BP: 123/63  Pulse: 134  Temp:   Resp: 17    Intake/Output Summary (Last 24 hours) at 09/02/14 1107 Last data filed at 09/02/14 0450  Gross per 24 hour  Intake      0 ml  Output    675 ml  Net   -675 ml   Filed Weights   08/31/14 0500 09/01/14 0445 09/02/14 0400  Weight: 76.34 kg (168 lb 4.8 oz) 77.61 kg (171 lb 1.6 oz) 75.07 kg (165 lb 8 oz)    Exam:   General:  NAD  Cardiovascular: Irregularly irregular  Respiratory: Decreased breath sounds in the right  base, otherwise clear. Right chest tube has been removed. Pigtail catheter in place.  Abdomen: Soft, nontender, nondistended, positive bowel sounds.  Musculoskeletal: No clubbing cyanosis or edema.  Data Reviewed: Basic Metabolic Panel:  Recent Labs Lab 08/29/14 0600 08/30/14 0410 08/31/14 0320 09/01/14 0517 09/02/14 0316  NA 130* 130* 129* 128* 132*  K 4.2 4.5 5.0 4.7 4.9  CL 98* 98* 100* 100* 101  CO2 25 24 21* 23 21*  GLUCOSE 116* 104* 110* 113* 103*  BUN 30* 36* 29* 30* 29*  CREATININE 1.14 1.29* 1.24 1.12 1.21  CALCIUM 8.5* 8.2* 7.9* 7.9* 7.9*   Liver Function Tests: No results for input(s): AST, ALT, ALKPHOS, BILITOT, PROT, ALBUMIN in the last 168 hours. No results for input(s): LIPASE, AMYLASE in the last 168 hours. No results for input(s): AMMONIA in the last 168 hours. CBC:  Recent Labs Lab 08/29/14 0600 08/30/14 0410 08/31/14 0320 09/01/14 0517 09/02/14 0316  WBC 17.6* 16.1* 21.0* 20.1* 16.5*  HGB 10.7* 10.1* 10.1* 9.4* 8.7*  HCT 32.1* 30.2* 30.2* 27.9* 26.6*  MCV 89.9 87.8 88.6 87.2 89.9  PLT 239 235 231 246 230   Cardiac Enzymes: No results for input(s): CKTOTAL, CKMB, CKMBINDEX, TROPONINI in the last 168 hours. BNP (last 3 results)  Recent Labs  04/21/14 1541 08/25/14 0700  BNP 484.0* 224.5*    ProBNP (last 3 results) No results for input(s): PROBNP in the last 8760 hours.  CBG: No results for input(s): GLUCAP in the last 168 hours.  Recent Results (from the past 240 hour(s))  Culture, blood (routine x 2)     Status: None   Collection Time: 08/25/14  7:00 AM  Result Value Ref Range Status   Specimen Description BLOOD RIGHT ANTECUBITAL  Final   Special Requests BAA 10CC  Final   Culture NO GROWTH  5 DAYS  Final   Report Status 08/30/2014 FINAL  Final  Culture, blood (routine x 2)     Status: None   Collection Time: 08/25/14  7:29 AM  Result Value Ref Range Status   Specimen Description BLOOD RIGHT ANTECUBITAL  Final   Special  Requests BOTTLES DRAWN AEROBIC AND ANAEROBIC 5CC  Final   Culture NO GROWTH 5 DAYS  Final   Report Status 08/30/2014 FINAL  Final  Culture, body fluid-bottle     Status: None   Collection Time: 08/26/14 11:09 AM  Result Value Ref Range Status   Specimen Description FLUID RIGHT PLEURAL  Final   Special Requests NONE  Final   Culture NO GROWTH 5 DAYS  Final   Report Status 08/31/2014 FINAL  Final  Gram stain     Status: None   Collection Time: 08/26/14 11:09 AM  Result Value Ref Range Status   Specimen Description FLUID RIGHT PLEURAL  Final   Special Requests NONE  Final   Gram Stain   Final    ABUNDANT WBC PRESENT, PREDOMINANTLY PMN NO ORGANISMS SEEN    Report Status 08/26/2014 FINAL  Final  Gram stain     Status: None   Collection Time: 09/01/14  3:45 PM  Result Value Ref Range Status   Specimen Description PLEURAL FLUID  Final   Special Requests NONE  Final   Gram Stain   Final    ABUNDANT WBC PRESENT,BOTH PMN AND MONONUCLEAR NO ORGANISMS SEEN    Report Status 09/02/2014 FINAL  Final     Studies: Dg Chest Port 1 View  09/02/2014   CLINICAL DATA:  Chest tube treatment of right pleural effusion  EXAM: PORTABLE CHEST - 1 VIEW  COMPARISON:  Chest x-ray of August 31, 2014  FINDINGS: The large caliber chest tube tip on the right projects over the posterior aspect of the seventh rib. The new small caliber tube has its pigtail overlying the medial costovertebral angle on the right. There is persistent right lower lobe atelectasis. The left lung is clear. There is no mediastinal shift. There is no pneumothorax. A small amount of pleural fluid likely remains on the right. The heart is normal in size. The pulmonary vascularity is mildly prominent centrally. The bony thorax exhibits no acute abnormality.  IMPRESSION: Stable appearance of the right hemithorax with the presence of 2 chest tubes. There remains parenchymal consolidation as well as small amount of pleural fluid on the right.    Electronically Signed   By: David  Martinique M.D.   On: 09/02/2014 07:41   Dg Swallowing Func-speech Pathology  09/01/2014    Objective Swallowing Evaluation:    Patient Details  Name: DANA Strong MRN: [id number] Date of Birth: 1923/09/11  Today's Date: 09/01/2014 Time: SLP Start Time (ACUTE ONLY): 1245-SLP Stop Time (ACUTE ONLY): 1320 SLP Time Calculation (min) (ACUTE ONLY): 35 min  Past Medical History:  Past Medical History  Diagnosis Date  . Osteopenia 2006  . Hyperlipidemia   . BPH (benign prostatic hypertrophy)   . Transient global amnesia   . Osteoarthritis   . History of TIA (transient ischemic attack) 1980'S    NO RESIDUAL  . COPD (chronic obstructive pulmonary disease) with emphysema   . Chronic back pain   . Itching SECONDARY TO CLL-- CONTROLLED W/ SINGULAIR  . Frequency   . Nocturia   . Hypertension CARDIOLOGIST- DR BRACKBILL-- LAST VISIT 12-15-2010 NOTE IN  EPIC  . CLL (chronic lymphocytic leukemia) LAST PLT COUNT APRL 2012  [place]legs at [place]  . Bladder cancer   . Aspirin allergy     Eyes swell  . Asthma   . Dyspnea on exertion   . CAD (coronary artery disease)     a. 08/2012 Cath: LM nl, LAD 90p, LCX 20-30, RCA 100ost fills via L->R  collats, EF 55-65%;  b. 09/2012 PCI of prox LAD with 3.0x16 Promus DES.   Past Surgical History:  Past Surgical History  Procedure Laterality Date  . Excisional hemorrhoidectomy  1980  . Cardiovascular stress test  07/2005    a. 07/2005- no reversible ischemia, normal EF b. 06/2009- EF 73%, no  reversible ischemia, inferolateral TWIs at rest, upright in recovery c.  08/2012- mediuim-sized partially reversible basal to mid inferior and  inferoseptal perfusion defect c/w with prior infarction and peri-infarct  ischemia; EF 68% and borderline ST/T changes on stress ECG   . Transurethral resection of bladder tumor  10-19-2008    AND DILATION URETHRAL STRICTURE  . Cataract extraction w/ intraocular lens  implant, bilateral Bilateral ~  2007  .  Transthoracic echocardiogram  01-03-2011    LVEF 16-10%, grade 1 diastolic dysfunction, no WMAs or structural  abnormalities  . Cystoscopy with biopsy  10/30/2011    Procedure: CYSTOSCOPY WITH BIOPSY;  Surgeon: Claybon Jabs, MD;   Location: [facility]  Service: Urology;  Laterality: N/A;   . Cardiac catheterization  08/27/2012  . Coronary angioplasty with stent placement  09/26/2012    "1" (09/26/2012)  . Coronary stent placement  09/26/12  . Left heart catheterization with coronary angiogram N/A 08/27/2012    Procedure: LEFT HEART CATHETERIZATION WITH CORONARY ANGIOGRAM;  Surgeon:  Peter M Martinique, MD;  Location: [facility] CATH LAB;  Service: Cardiovascular;   Laterality: N/A;  . Percutaneous coronary stent intervention (pci-s) N/A 09/26/2012    Procedure: PERCUTANEOUS CORONARY STENT INTERVENTION (PCI-S);  Surgeon:  Peter M Martinique, MD;  Location: [facility] CATH LAB;  Service: Cardiovascular;   Laterality: N/A;   HPI:  Other Pertinent Information: 81 y.o. male with past history of COPD,  paroxysmal A. fib on Eliquis, CAD, hypertension was recently discharged  from [facility] 6/9 after treatment for community-acquired  pneumonia. He presented to the ER where he was noted to have a white count  of 24K, chest x-ray with worsening pneumonia and moderate right pleural  effusion. Pt with chest tube.Previous admission for RLL PNA  with bedside  swallow completed 6/9: notes WFL oropharyngeal swallow with suspected  esophageal component.   No Data Recorded  Assessment / Plan / Recommendation CHL IP CLINICAL IMPRESSIONS 09/01/2014  Therapy Diagnosis Mild oral phase dysphagia;Suspected primary esophageal  dysphagia  Clinical Impression Pt demonstrates very mild oral dysphagia warranting  basic precautions to reduce risk of aspiration. The pt has trace premature  spillage and trace oral residuals after the swallow, both of which spill  to the valleculae and pyriforms prior to the swallow with 1-2 instances of  trace  penetration, expelled with a swallow. Oropharyngeal function is  otherwise WNL. An esophageal sweep does reveal stasis throughout the  distal to mid esophagus with upward movement of residual. The barium  tablet remained at the GE junction at the end of the test. Pt is  recommended to follow precautions to reduce risk of aspiration including  upright positioning, a second swallow and staying upright 30-60 minutes  after meal. Despite the precautions the pts highest risk is aspiration of  esophageal stasis after meals. Suggest f/u esophageal testing. SLP will  f/u for reinforcement of precautions.       CHL IP TREATMENT RECOMMENDATION 09/01/2014  Treatment Recommendations Therapy as outlined in treatment plan below     CHL IP DIET RECOMMENDATION 09/01/2014  SLP Diet Recommendations Age appropriate regular solids;Thin  Liquid Administration via (None)  Medication Administration Whole meds with liquid  Compensations Small sips/bites  Postural Changes and/or Swallow Maneuvers (None)     CHL IP OTHER RECOMMENDATIONS 09/01/2014  Recommended Consults Consider esophageal assessment;Consider GI evaluation   Oral Care Recommendations Oral care BID  Other Recommendations (None)     No flowsheet data found.   CHL IP FREQUENCY AND DURATION 09/01/2014  Speech Therapy Frequency (ACUTE ONLY) min 2x/week  Treatment Duration 1 week       No flowsheet data found.  No flowsheet data found.        Herbie [place], [place] CCC-SLP [phone]  DeBlois, Katherene Ponto 09/01/2014, 2:57 PM    Ct Image Guided Drainage By Percutaneous Catheter  09/01/2014   INDICATION: History of community acquired pneumonia, post right-sided chest tube placement with residual loculated fluid seen within the caudal aspect of the right pleural space. Request made for placement of additional CT-guided percutaneous chest tube for infection source control purposes.  EXAM: CT IMAGE GUIDED DRAINAGE BY PERCUTANEOUS CATHETER  COMPARISON:  Chest CT- 08/10/2014; 08/25/2014;  chest radiograph - 08/31/2014; ultrasound-guided right-sided thoracentesis - 08/26/2014  MEDICATIONS: The patient is currently admitted to the hospital and receiving intravenous antibiotics. The antibiotics were administered within an appropriate time frame prior to the initiation of the procedure.  ANESTHESIA/SEDATION: Fentanyl 25 mcg IV; Versed 5 mg IV  Total Moderate Sedation time  20 minutes  CONTRAST:  None  COMPLICATIONS: None immediate  PROCEDURE: Informed written consent was obtained from the patient after a discussion of the risks, benefits and alternatives to treatment. The patient was placed right lateral decubitus on the CT gantry and a pre procedural CT was performed re-demonstrating the known loculated fluid collection within the right pleural space. The procedure was planned. A timeout was performed prior to the initiation of the procedure.  The right inferior posterior lateral chest wall was prepped and draped in the usual sterile fashion. The overlying soft tissues were anesthetized with 1% lidocaine with epinephrine. Appropriate trajectory was planned with the use of a 22 gauge spinal needle. An 18 gauge trocar needle was advanced into the right pleural space and a short Amplatz super stiff wire was coiled within the collection. Appropriate positioning was confirmed with a limited CT scan. The tract was serially dilated allowing placement of a 12 [place] all-purpose drainage catheter. Appropriate positioning was confirmed with a limited postprocedural CT scan demonstrating the and of the percutaneous drainage catheter coiled and locked within the posterior medial aspect of the right pleural space (series 7).  Approximately 50 cc blood tinged pleural fluid was aspirated. The tube was connected to a pleural vac device and sutured in place. A dressing was placed. The patient tolerated the procedure well without immediate post procedural complication.  IMPRESSION: Successful CT guided placement of a 47  French all purpose drain catheter into the caudal aspect of the right pleural space with aspiration of approximately 50 mL of blood tinged pleural fluid. Samples were sent to the laboratory as requested by the ordering clinical team.   Electronically Signed   By: Sandi Mariscal M.D.   On: 09/01/2014 16:50    Scheduled Meds: . atorvastatin  10 mg Oral q1800  . budesonide-formoterol  2 puff Inhalation BID  . calcium carbonate  1 tablet Oral BID  . cefTAZidime (FORTAZ)  IV  2 g Intravenous Q12H  . diltiazem  300 mg Oral Daily  . feeding supplement (ENSURE ENLIVE)  237 mL Oral TID BM  . metoprolol tartrate  25 mg Oral TID  . montelukast  10 mg Oral QHS  . polyethylene glycol  17 g Oral Daily  . senna-docusate  1 tablet Oral BID  . tiotropium  18 mcg Inhalation Daily   Continuous Infusions: . sodium chloride 10 mL/hr at 09/01/14 1204    Principal Problem:   HCAP (healthcare-associated pneumonia) Active Problems:   Pleural effusion associated with pulmonary infection   CAD (coronary artery disease)   Atrial fibrillation   Chronic lymphatic leukemia   Pleural effusion on right   Sepsis   Pleural effusion   Pleural effusion, right   Blood poisoning   Chest tube in place   SOB (shortness of breath)    Time spent: 35 minutes    [facility] MD [facility] Pager [phone]. If 7PM-7AM, please contact night-coverage at [email or web address], password [facility] 09/02/2014, 11:07 AM  LOS: 8 days

## 2014-09-02 NOTE — Consult Note (Signed)
   [facility] CM Inpatient Consult   09/02/2014  SID GREENER 04-04-1923 [phone]   Followed up with patient again for [facility] services. Spoke with patient and wife at bedside to discuss [facility]. Mr Dais was pleasant but adamant by stating " I do not need anyone to follow up with me about medications or disease management or processes, we pretty much understand and manage well at home". Patient declines [facility] Care Management. Made inpatient RNCM aware.  Marthenia Rolling, MSN-Ed, RN,BSN [facility] Liaison [phone]

## 2014-09-03 ENCOUNTER — Inpatient Hospital Stay (HOSPITAL_COMMUNITY): Payer: Medicare Other

## 2014-09-03 DIAGNOSIS — I481 Persistent atrial fibrillation: Secondary | ICD-10-CM

## 2014-09-03 LAB — CBC
HEMATOCRIT: 26.1 % — AB (ref 39.0–52.0)
HEMOGLOBIN: 8.5 g/dL — AB (ref 13.0–17.0)
MCH: 28.9 pg (ref 26.0–34.0)
MCHC: 32.6 g/dL (ref 30.0–36.0)
MCV: 88.8 fL (ref 78.0–100.0)
Platelets: 287 10*3/uL (ref 150–400)
RBC: 2.94 MIL/uL — ABNORMAL LOW (ref 4.22–5.81)
RDW: 23.5 % — ABNORMAL HIGH (ref 11.5–15.5)
WBC: 14.8 10*3/uL — ABNORMAL HIGH (ref 4.0–10.5)

## 2014-09-03 LAB — BODY FLUID CELL COUNT WITH DIFFERENTIAL
LYMPHS FL: 1 %
Monocyte-Macrophage-Serous Fluid: 2 % — ABNORMAL LOW (ref 50–90)
Neutrophil Count, Fluid: 97 % — ABNORMAL HIGH (ref 0–25)
Total Nucleated Cell Count, Fluid: 1930 cu mm — ABNORMAL HIGH (ref 0–1000)

## 2014-09-03 LAB — BASIC METABOLIC PANEL
Anion gap: 5 (ref 5–15)
BUN: 28 mg/dL — AB (ref 6–20)
CHLORIDE: 103 mmol/L (ref 101–111)
CO2: 24 mmol/L (ref 22–32)
Calcium: 8 mg/dL — ABNORMAL LOW (ref 8.9–10.3)
Creatinine, Ser: 1.22 mg/dL (ref 0.61–1.24)
GFR calc Af Amer: 59 mL/min — ABNORMAL LOW (ref >60)
GFR, EST NON AFRICAN AMERICAN: 51 mL/min — AB (ref >60)
GLUCOSE: 107 mg/dL — AB (ref 65–99)
Potassium: 4.5 mmol/L (ref 3.5–5.1)
SODIUM: 132 mmol/L — AB (ref 135–145)

## 2014-09-03 LAB — LACTATE DEHYDROGENASE, PLEURAL OR PERITONEAL FLUID: LD, Fluid: 3452 U/L — ABNORMAL HIGH (ref 3–23)

## 2014-09-03 LAB — MAGNESIUM: Magnesium: 2 mg/dL (ref 1.7–2.4)

## 2014-09-03 MED ORDER — LEVOFLOXACIN 500 MG PO TABS
500.0000 mg | ORAL_TABLET | Freq: Every day | ORAL | Status: DC
  Administered 2014-09-03: 500 mg via ORAL
  Filled 2014-09-03: qty 1

## 2014-09-03 MED ORDER — LEVOFLOXACIN 500 MG PO TABS
500.0000 mg | ORAL_TABLET | ORAL | Status: AC
Start: 2014-09-05 — End: 2014-09-06
  Administered 2014-09-05 – 2014-09-06 (×2): 500 mg via ORAL
  Filled 2014-09-03 (×2): qty 1

## 2014-09-03 MED ORDER — DIGOXIN 125 MCG PO TABS
0.1250 mg | ORAL_TABLET | Freq: Every day | ORAL | Status: DC
  Administered 2014-09-04 – 2014-09-08 (×5): 0.125 mg via ORAL
  Filled 2014-09-03 (×5): qty 1

## 2014-09-03 MED ORDER — DIGOXIN 0.25 MG/ML IJ SOLN
0.2500 mg | Freq: Four times a day (QID) | INTRAMUSCULAR | Status: AC
  Administered 2014-09-03 (×2): 0.25 mg via INTRAVENOUS
  Filled 2014-09-03 (×2): qty 1

## 2014-09-03 NOTE — Progress Notes (Signed)
Patient Name: Johnathan Strong Date of Encounter: 09/03/2014  Primary Cardiologist: Dr. Mare Ferrari   Principal Problem:   HCAP (healthcare-associated pneumonia) Active Problems:   CAD (coronary artery disease)   Atrial fibrillation   Chronic lymphatic leukemia   Pleural effusion on right   Sepsis   Pleural effusion   Pleural effusion, right   Blood poisoning   Chest tube in place   Pleural effusion associated with pulmonary infection   SOB (shortness of breath)    SUBJECTIVE  Breathing getting better. One of the chest tube removed yesterday.  CURRENT MEDS . atorvastatin  10 mg Oral q1800  . budesonide-formoterol  2 puff Inhalation BID  . calcium carbonate  1 tablet Oral BID  . cefTAZidime (FORTAZ)  IV  2 g Intravenous Q12H  . diltiazem  240 mg Oral Daily  . feeding supplement (ENSURE ENLIVE)  237 mL Oral TID BM  . metoprolol tartrate  25 mg Oral TID  . montelukast  10 mg Oral QHS  . polyethylene glycol  17 g Oral Daily  . senna-docusate  1 tablet Oral BID  . tiotropium  18 mcg Inhalation Daily    OBJECTIVE  Filed Vitals:   09/02/14 2330 09/03/14 0330 09/03/14 0400 09/03/14 0730  BP: 111/67 137/48 149/48 155/81  Pulse:  61 95 69  Temp: 98.9 F (37.2 C)  99 F (37.2 C)   TempSrc: Oral     Resp: 16 14 23 16   Height:      Weight:   162 lb 3.2 oz (73.573 kg)   SpO2: 98% 99% 100% 97%    Intake/Output Summary (Last 24 hours) at 09/03/14 0858 Last data filed at 09/03/14 0745  Gross per 24 hour  Intake   1180 ml  Output    875 ml  Net    305 ml   Filed Weights   09/01/14 0445 09/02/14 0400 09/03/14 0400  Weight: 171 lb 1.6 oz (77.61 kg) 165 lb 8 oz (75.07 kg) 162 lb 3.2 oz (73.573 kg)    PHYSICAL EXAM  General: Pleasant, NAD. Neuro: Alert and oriented X 3. Moves all extremities spontaneously. Psych: Normal affect. HEENT:  Normal  Neck: Supple without bruits or JVD. Lungs:  Resp regular and unlabored. One of 2 chest tube was pulled out yesterday, 1  remain. Heart: tachycardic. no s3, s4, or murmurs. Abdomen: Soft, non-tender, non-distended, BS + x 4.  Extremities: No clubbing, cyanosis or edema. DP/PT/Radials 2+ and equal bilaterally.  Accessory Clinical Findings  CBC  Recent Labs  09/02/14 0316 09/03/14 0453  WBC 16.5* 14.8*  HGB 8.7* 8.5*  HCT 26.6* 26.1*  MCV 89.9 88.8  PLT 230 308   Basic Metabolic Panel  Recent Labs  09/02/14 0316 09/03/14 0453  NA 132* 132*  K 4.9 4.5  CL 101 103  CO2 21* 24  GLUCOSE 103* 107*  BUN 29* 28*  CREATININE 1.21 1.22  CALCIUM 7.9* 8.0*  MG  --  2.0    TELE A-fib with HR high 90s-110s    ECG  No new EKG  Echocardiogram 04/22/2014  LV EF: 60% -  65%  ------------------------------------------------------------------- Indications:   Dyspnea 786.09.  ------------------------------------------------------------------- History:  PMH:  Coronary artery disease. Chronic obstructive pulmonary disease. Risk factors: Former tobacco use. Hypertension.  ------------------------------------------------------------------- Study Conclusions  - Left ventricle: The cavity size was normal. There was mild concentric hypertrophy. Systolic function was normal. The estimated ejection fraction was in the range of 60% to 65%. Wall motion was  normal; there were no regional wall motion abnormalities. Doppler parameters are consistent with abnormal left ventricular relaxation (grade 1 diastolic dysfunction). Doppler parameters are consistent with high ventricular filling pressure. - Mitral valve: Moderately calcified annulus. There was mild regurgitation. - Left atrium: The atrium was mildly dilated.    Radiology/Studies  Dg Chest 2 View  08/31/2014   CLINICAL DATA:  Right pleural effusion with chest tube drainage, shortness of breath.  EXAM: CHEST  2 VIEW  COMPARISON:  Chest x-ray of June Jul 29, 2014 and chest CT scan of August 30, 2014.  FINDINGS: On the  right there is mild volume loss and a moderate-sized pleural effusion. The right chest tube tip projects over the posterior aspect of the seventh rib and is unchanged. There is atelectasis or pneumonia at the right lung base which is stable. The left lung is well-expanded. There is a stable approximately 11 mm calcified nodule in the left lower lobe. There is no mediastinal shift. The cardiac silhouette is mildly enlarged. The pulmonary vascularity is normal. The bony thorax exhibits no acute abnormality.  IMPRESSION: Persistent moderate-sized inferior right pleural effusion. The tip of the right chest tube lies along the superior aspect of the fluid collection similar to the positioning described on the CT scan.  Mild stable enlargement of the cardiac silhouette without pulmonary edema.  Evidence of previous granulomatous infection.   Electronically Signed   By: David  Martinique M.D.   On: 08/31/2014 09:19   Dg Chest Port 1 View  09/03/2014   CLINICAL DATA:  Respiratory failure, right-sided chest tube  EXAM: PORTABLE CHEST - 1 VIEW  COMPARISON:  Portable chest x-ray of September 02, 2014  FINDINGS: The large caliber right chest tube has been removed. The pigtail catheter remains in place with the pigtail located in the medial costophrenic gutter. There is stable increased density at the right lung base consistent with pleural fluid and/or pleural thickening. There is no pneumothorax. The left lung is clear. The heart and mediastinal structures are normal. The observed bony thorax is unremarkable.  IMPRESSION: Interval removal of the large caliber chest tube with stable positioning of the small caliber tube. There is stable increased density at the right lung base consistent with pleural fluid or thickening and likely atelectasis.   Electronically Signed   By: David  Martinique M.D.   On: 09/03/2014 07:27   Dg Chest Port 1 View  09/02/2014   CLINICAL DATA:  Chest tube treatment of right pleural effusion  EXAM: PORTABLE  CHEST - 1 VIEW  COMPARISON:  Chest x-ray of August 31, 2014  FINDINGS: The large caliber chest tube tip on the right projects over the posterior aspect of the seventh rib. The new small caliber tube has its pigtail overlying the medial costovertebral angle on the right. There is persistent right lower lobe atelectasis. The left lung is clear. There is no mediastinal shift. There is no pneumothorax. A small amount of pleural fluid likely remains on the right. The heart is normal in size. The pulmonary vascularity is mildly prominent centrally. The bony thorax exhibits no acute abnormality.  IMPRESSION: Stable appearance of the right hemithorax with the presence of 2 chest tubes. There remains parenchymal consolidation as well as small amount of pleural fluid on the right.   Electronically Signed   By: David  Martinique M.D.   On: 09/02/2014 07:41   Dg Swallowing Func-speech Pathology  09/01/2014    Objective Swallowing Evaluation:    Patient Details  Name: Johnathan Strong MRN: [id number] Date of Birth: August 12, 1923  Today's Date: 09/01/2014 Time: SLP Start Time (ACUTE ONLY): 1245-SLP Stop Time (ACUTE ONLY): 1320 SLP Time Calculation (min) (ACUTE ONLY): 35 min  Past Medical History:  Past Medical History  Diagnosis Date  . Osteopenia 2006  . Hyperlipidemia   . BPH (benign prostatic hypertrophy)   . Transient global amnesia   . Osteoarthritis   . History of TIA (transient ischemic attack) 1980'S    NO RESIDUAL  . COPD (chronic obstructive pulmonary disease) with emphysema   . Chronic back pain   . Itching SECONDARY TO CLL-- CONTROLLED W/ SINGULAIR  . Frequency   . Nocturia   . Hypertension CARDIOLOGIST- DR BRACKBILL-- LAST VISIT 12-15-2010 NOTE IN  EPIC  . CLL (chronic lymphocytic leukemia) LAST PLT COUNT APRL 2012  168    Managed at [facility]  . Bladder cancer   . Aspirin allergy     Eyes swell  . Asthma   . Dyspnea on exertion   . CAD (coronary artery disease)     a. 08/2012 Cath: LM nl, LAD 90p, LCX 20-30, RCA  100ost fills via L->R  collats, EF 55-65%;  b. 09/2012 PCI of prox LAD with 3.0x16 Promus DES.   Past Surgical History:  Past Surgical History  Procedure Laterality Date  . Excisional hemorrhoidectomy  1980  . Cardiovascular stress test  07/2005    a. 07/2005- no reversible ischemia, normal EF b. 06/2009- EF 73%, no  reversible ischemia, inferolateral TWIs at rest, upright in recovery c.  08/2012- mediuim-sized partially reversible basal to mid inferior and  inferoseptal perfusion defect c/w with prior infarction and peri-infarct  ischemia; EF 68% and borderline ST/T changes on stress ECG   . Transurethral resection of bladder tumor  10-19-2008    AND DILATION URETHRAL STRICTURE  . Cataract extraction w/ intraocular lens  implant, bilateral Bilateral ~  2007  . Transthoracic echocardiogram  01-03-2011    LVEF 38-93%, grade 1 diastolic dysfunction, no WMAs or structural  abnormalities  . Cystoscopy with biopsy  10/30/2011    Procedure: CYSTOSCOPY WITH BIOPSY;  Surgeon: Claybon Jabs, MD;   Location: [facility];  Service: Urology;  Laterality: N/A;   . Cardiac catheterization  08/27/2012  . Coronary angioplasty with stent placement  09/26/2012    "1" (09/26/2012)  . Coronary stent placement  09/26/12  . Left heart catheterization with coronary angiogram N/A 08/27/2012    Procedure: LEFT HEART CATHETERIZATION WITH CORONARY ANGIOGRAM;  Surgeon:  Peter M Martinique, MD;  Location: [facility] C[facility] LAB;  Service: Cardiovascular;   Laterality: N/A;  . Percutaneous coronary stent intervention (pci-s) N/A 09/26/2012    Procedure: PERCUTANEOUS CORONARY STENT INTERVENTION (PCI-S);  Surgeon:  Peter M Martinique, MD;  Location: [facility] CATH LAB;  Service: Cardiovascular;   Laterality: N/A;   HPI:  Other Pertinent Information: 81 y.o. male with past history of COPD,  paroxysmal A. fib on Eliquis, CAD, hypertension was recently discharged  from [facility] 6/9 after treatment for community-acquired  pneumonia. He presented to the ER  where he was noted to have a white count  of 24K, chest x-ray with worsening pneumonia and moderate right pleural  effusion. Pt with chest tube.Previous admission for RLL PNA  with bedside  swallow completed 6/9: notes WFL oropharyngeal swallow with suspected  esophageal component.   No Data Recorded  Assessment / Plan / Recommendation CHL IP CLINICAL IMPRESSIONS 09/01/2014  Therapy Diagnosis Mild  oral phase dysphagia;Suspected primary esophageal  dysphagia  Clinical Impression Pt demonstrates very mild oral dysphagia warranting  basic precautions to reduce risk of aspiration. The pt has trace premature  spillage and trace oral residuals after the swallow, both of which spill  to the valleculae and pyriforms prior to the swallow with 1-2 instances of  trace penetration, expelled with a swallow. Oropharyngeal function is  otherwise WNL. An esophageal sweep does reveal stasis throughout the  distal to mid esophagus with upward movement of residual. The barium  tablet remained at the GE junction at the end of the test. Pt is  recommended to follow precautions to reduce risk of aspiration including  upright positioning, a second swallow and staying upright 30-60 minutes  after meal. Despite the precautions the pts highest risk is aspiration of  esophageal stasis after meals. Suggest f/u esophageal testing. SLP will  f/u for reinforcement of precautions.       CHL IP TREATMENT RECOMMENDATION 09/01/2014  Treatment Recommendations Therapy as outlined in treatment plan below     CHL IP DIET RECOMMENDATION 09/01/2014  SLP Diet Recommendations Age appropriate regular solids;Thin  Liquid Administration via (None)  Medication Administration Whole meds with liquid  Compensations Small sips/bites  Postural Changes and/or Swallow Maneuvers (None)     CHL IP OTHER RECOMMENDATIONS 09/01/2014  Recommended Consults Consider esophageal assessment;Consider GI evaluation   Oral Care Recommendations Oral care BID  Other Recommendations  (None)     No flowsheet data found.   CHL IP FREQUENCY AND DURATION 09/01/2014  Speech Therapy Frequency (ACUTE ONLY) min 2x/week  Treatment Duration 1 week       No flowsheet data found.  No flowsheet data found.        Herbie [place], [place] CCC-SLP [phone]  DeBlois, Katherene Ponto 09/01/2014, 2:57 PM    Ct Image Guided Drainage By Percutaneous Catheter  09/01/2014   INDICATION: History of community acquired pneumonia, post right-sided chest tube placement with residual loculated fluid seen within the caudal aspect of the right pleural space. Request made for placement of additional CT-guided percutaneous chest tube for infection source control purposes.  EXAM: CT IMAGE GUIDED DRAINAGE BY PERCUTANEOUS CATHETER  COMPARISON:  Chest CT- 08/10/2014; 08/25/2014; chest radiograph - 08/31/2014; ultrasound-guided right-sided thoracentesis - 08/26/2014  MEDICATIONS: The patient is currently admitted to the hospital and receiving intravenous antibiotics. The antibiotics were administered within an appropriate time frame prior to the initiation of the procedure.  ANESTHESIA/SEDATION: Fentanyl 25 mcg IV; Versed 5 mg IV  Total Moderate Sedation time  20 minutes  CONTRAST:  None  COMPLICATIONS: None immediate  PROCEDURE: Informed written consent was obtained from the patient after a discussion of the risks, benefits and alternatives to treatment. The patient was placed right lateral decubitus on the CT gantry and a pre procedural CT was performed re-demonstrating the known loculated fluid collection within the right pleural space. The procedure was planned. A timeout was performed prior to the initiation of the procedure.  The right inferior posterior lateral chest wall was prepped and draped in the usual sterile fashion. The overlying soft tissues were anesthetized with 1% lidocaine with epinephrine. Appropriate trajectory was planned with the use of a 22 gauge spinal needle. An 18 gauge trocar needle was advanced into the  right pleural space and a short Amplatz super stiff wire was coiled within the collection. Appropriate positioning was confirmed with a limited CT scan. The tract was serially dilated allowing placement of a 12 [place] all-purpose drainage catheter.  Appropriate positioning was confirmed with a limited postprocedural CT scan demonstrating the and of the percutaneous drainage catheter coiled and locked within the posterior medial aspect of the right pleural space (series 7).  Approximately 50 cc blood tinged pleural fluid was aspirated. The tube was connected to a pleural vac device and sutured in place. A dressing was placed. The patient tolerated the procedure well without immediate post procedural complication.  IMPRESSION: Successful CT guided placement of a 3 French all purpose drain catheter into the caudal aspect of the right pleural space with aspiration of approximately 50 mL of blood tinged pleural fluid. Samples were sent to the laboratory as requested by the ordering clinical team.   Electronically Signed   By: Sandi Mariscal M.D.   On: 09/01/2014 16:50     ASSESSMENT AND PLAN  1. Recurrent proxysmal atrial firbillation in the setting of severe lung dx  - CHA2DS12-Vasc score 6 (hypertension, age above 83, history of TIA, and vascular disease with prior coronary artery stent)  - eliquis on hold given chest tube. Previously went into PAF during last admission for PNA in early June, reverted back to NSR as he recovered.  - tried to get better rate control via increasing diltiazem yesterday, however pt became dizzy with SBP around 91. Diltiazem reverted back to 240mg  daily. BP now in 130-150s, can attempt at increasing dose again today. Also will discuss with MD regarding adding digoxin, his Cr is 1.22, however with his HR uncontrolled in 110-140s, option is limited. Could not cardiovert as pt is off systemic anticoagulation given serosangenous drainage from chest tube.    2. HCAP: management per  IM  3. Pleural effusion s/p chest tube  4. CAD s/p DES to LAD in 2014, also noted to have ostial RCA occlusion with good L to R collateral  - continue BB and statin, plavix on hold.  Not on ASA given systemic anticoagulation need  5. COPD 6. CLL followed at [facility] 7. Anemia  Signed, Almyra Deforest PA-C Pager: [phone]  Personally seen and examined. Agree with above. Chest tube Anticoagulation on hold AFIB Heart rate elevated Will try DIG IV .25 x 2 then oral PO tomorrow.  Discussed with Zane Herald, MD   Candee Furbish, MD

## 2014-09-03 NOTE — Progress Notes (Signed)
Physical Therapy Treatment Patient Details Name: ZAIDEN LUDLUM MRN: [id number] DOB: [birth year] Today's Date: 09/03/2014    History of Present Illness 81 y.o. male with past history of COPD, paroxysmal A. fib on Eliquis, CAD, hypertension was recently discharged from [facility] 6/9 after treatment for community-acquired pneumonia. He presented to the ER where he was noted to have a white count of 24K, chest x-ray with worsening pneumonia and moderate right pleural effusion. Pt with chest tube.    PT Comments    Progressing with activity tolerance, though HR up little higher.  Daughter reports walked faster this session than earlier with RN and noted increased distance.  Will continue to benefit from skilled PT prior to d/c.  Follow Up Recommendations  Home health PT;Supervision for mobility/OOB     Equipment Recommendations  Rolling walker with 5" wheels    Recommendations for Other Services       Precautions / Restrictions Precautions Precautions: Fall Precaution Comments: chest tube; watch HR and O2 sats    Mobility  Bed Mobility               General bed mobility comments: pt received in chair  Transfers Overall transfer level: Needs assistance Equipment used: Rolling walker (2 wheeled) Transfers: Sit to/from Stand Sit to Stand: Min guard         General transfer comment: min cues for hand placement  Ambulation/Gait Ambulation/Gait assistance: Supervision Ambulation Distance (Feet): 200 Feet Assistive device: Rolling walker (2 wheeled) Gait Pattern/deviations: Step-through pattern;Decreased stride length;Shuffle;Trunk flexed     General Gait Details: cues for relaxing shoulders, stood to rest about 5 times during ambulation, HR max 151, 120's at rest (just recieved meds for HR prior to ambulation)   Stairs            Wheelchair Mobility    Modified Rankin (Stroke Patients Only)       Balance Overall balance assessment: No apparent  balance deficits (not formally assessed)                                  Cognition Arousal/Alertness: Awake/alert Behavior During Therapy: WFL for tasks assessed/performed Overall Cognitive Status: Within Functional Limits for tasks assessed                      Exercises General Exercises - Lower Extremity Long Arc Quad: AROM;Both;5 reps;Seated (w/ 3 sec hold) Hip Flexion/Marching: AROM;Both;5 reps;Seated Toe Raises: AROM;Seated;5 reps;Both Heel Raises: AROM;Seated;5 reps;Both    General Comments        Pertinent Vitals/Pain Pain Score: 3  Pain Location: chest tube site Pain Descriptors / Indicators: Sore Pain Intervention(s): Monitored during session;Patient requesting pain meds-RN notified    Home Living                      Prior Function            PT Goals (current goals can now be found in the care plan section) Progress towards PT goals: Progressing toward goals    Frequency  Min 3X/week    PT Plan Current plan remains appropriate    Co-evaluation             End of Session   Activity Tolerance: Patient tolerated treatment well Patient left: in chair;with call bell/phone within reach;with family/visitor present     Time: 6440-3474 PT Time Calculation (min) (ACUTE ONLY):  31 min  Charges:  $Gait Training: 8-22 mins $Therapeutic Exercise: 8-22 mins                    G Codes:      Joetta Delprado,CYNDI 09-12-14, 5:07 PM  Magda Kiel, [place] 2014/09/12

## 2014-09-03 NOTE — Progress Notes (Signed)
Pt walked in the hall today. Pt did very well. Will continue to monitor. Etta Quill, RN

## 2014-09-03 NOTE — Care Management (Signed)
Important Message  Patient Details  Name: Johnathan Strong MRN: [id number] Date of Birth: 11/04/1923   Medicare Important Message Given:  Yes-third notification given    Pricilla Handler 09/03/2014, 2:48 PM

## 2014-09-03 NOTE — Progress Notes (Signed)
Patient ID: Johnathan Strong, male   DOB: 1923-11-13, 81 y.o.   MRN: [id number]    Referring Physician(s): Dr. Titus Mould  Subjective:  Johnathan Strong is s/p right pigtail chest drain on 6/28 by Dr. Pascal Lux.  He is doing much better today.  He is in good spirits and happy the larger chest tube has been removed.  He is comfortable and has no complaints  Allergies: Aspirin; Nsaids; Septra; Albuterol; Benicar; Salicylates; and Ampicillin  Medications: Prior to Admission medications   Medication Sig Start Date End Date Taking? Authorizing Provider  albuterol (VENTOLIN HFA) 108 (90 BASE) MCG/ACT inhaler Inhale 2 puffs into the lungs every 6 (six) hours as needed for wheezing.    Yes Historical Provider, MD  Azelastine HCl (ASTELIN NA) Place 1 spray into the nose as needed (for congestion).    Yes Historical Provider, MD  Cholecalciferol (VITAMIN D3) 1000 UNITS CAPS Take 1 capsule by mouth daily.    Yes Historical Provider, MD  cimetidine (TAGAMET) 300 MG tablet TAKE 1 TABLET UP TO 4 TIMES A DAY Patient taking differently: TAKE 1 TABLET UP TO 4 TIMES A DAY as needed 01/02/13  Yes Chipper Herb, MD  clopidogrel (PLAVIX) 75 MG tablet TAKE 1 TABLET ONCE A DAY 03/12/14  Yes [name], MD  diltiazem (CARDIZEM CD) 180 MG 24 hr capsule Take 1 capsule (180 mg total) by mouth daily. 08/13/14  Yes Kelvin Cellar, MD  diphenhydramine-acetaminophen (TYLENOL PM) 25-500 MG TABS Take 1 tablet by mouth at bedtime as needed. sleep   Yes Historical Provider, MD  ELIQUIS 5 MG TABS tablet TAKE  (1)  TABLET TWICE A DAY. 08/14/14  Yes Chipper Herb, MD  fluticasone (FLONASE) 50 MCG/ACT nasal spray USE 2 SPRAYS INTO EACH NOSTRIL ONCE DAILY Patient taking differently: USE 2 SPRAYS INTO EACH NOSTRIL ONCE DAILY as needed for allergies. 07/07/14  Yes Chipper Herb, MD  furosemide (LASIX) 20 MG tablet TAKE (1) TABLET DAILY AS DIRECTED. Patient taking differently: TAKE (1) TABLET DAILY AS DIRECTED as needed for swelling 02/03/14   Yes Chipper Herb, MD  LIPITOR 10 MG tablet TAKE 1 TABLET DAILY Patient taking differently: TAKE 1 TABLET DAILY in the evening. 04/13/14  Yes Chipper Herb, MD  losartan (COZAAR) 50 MG tablet TAKE (1) TABLET TWICE A DAY. 03/13/14  Yes Chipper Herb, MD  metoprolol tartrate (LOPRESSOR) 25 MG tablet Take 1 tablet (25 mg total) by mouth 2 (two) times daily. 05/19/14  Yes Chipper Herb, MD  montelukast (SINGULAIR) 10 MG tablet TAKE ONE TABLET AT BEDTIME 01/28/14  Yes Chipper Herb, MD  nitroGLYCERIN (NITROSTAT) 0.4 MG SL tablet Place 1 tablet (0.4 mg total) under the tongue every 5 (five) minutes as needed for chest pain. 09/27/12  Yes Rogelia Mire, NP  Omega-3 Fatty Acids (FISH OIL) 1200 MG CAPS Take 1 capsule by mouth daily.    Yes Historical Provider, MD  SPIRIVA HANDIHALER 18 MCG inhalation capsule INHALE THE CONTENTS OF ONE CAPSULE ONCE DAILY AS DIRECTED 05/25/14  Yes Chipper Herb, MD  SYMBICORT 160-4.5 MCG/ACT inhaler 2 PUFFS EVERY 12 HOURS 06/17/14  Yes Chipper Herb, MD  traMADol (ULTRAM) 50 MG tablet TAKE 1 TABLET UP TO 2 TIMES A DAY 07/14/14  Yes Chipper Herb, MD  triamcinolone cream (KENALOG) 0.1 % APPLY TO AFFECTED AREAS 2 TIMES A DAY AS NEEDED 01/02/13  Yes Chipper Herb, MD  cefUROXime (CEFTIN) 250 MG tablet Take 1 tablet (250 mg  total) by mouth 2 (two) times daily with a meal. Patient not taking: Reported on 08/25/2014 08/13/14   Kelvin Cellar, MD     Vital Signs: BP 155/81 mmHg  Pulse 69  Temp(Src) 99 F (37.2 C) (Oral)  Resp 16  Ht 5\' 8"  (1.727 m)  Wt 162 lb 3.2 oz (73.573 kg)  BMI 24.67 kg/m2  SpO2 98%  Physical Exam  Awake and Alert, NAD  Right pigtail chest drain in place.  No erythema at insertion site.  ~125 mls output recorded. Blood tinged fluid.  It remains to suction.  Imaging: Dg Chest 2 View  08/31/2014   CLINICAL DATA:  Right pleural effusion with chest tube drainage, shortness of breath.  EXAM: CHEST  2 VIEW  COMPARISON:  Chest x-ray of June Jul 29, 2014 and chest CT scan of August 30, 2014.  FINDINGS: On the right there is mild volume loss and a moderate-sized pleural effusion. The right chest tube tip projects over the posterior aspect of the seventh rib and is unchanged. There is atelectasis or pneumonia at the right lung base which is stable. The left lung is well-expanded. There is a stable approximately 11 mm calcified nodule in the left lower lobe. There is no mediastinal shift. The cardiac silhouette is mildly enlarged. The pulmonary vascularity is normal. The bony thorax exhibits no acute abnormality.  IMPRESSION: Persistent moderate-sized inferior right pleural effusion. The tip of the right chest tube lies along the superior aspect of the fluid collection similar to the positioning described on the CT scan.  Mild stable enlargement of the cardiac silhouette without pulmonary edema.  Evidence of previous granulomatous infection.   Electronically Signed   By: David  Martinique M.D.   On: 08/31/2014 09:19   Dg Chest Port 1 View  09/03/2014   CLINICAL DATA:  Respiratory failure, right-sided chest tube  EXAM: PORTABLE CHEST - 1 VIEW  COMPARISON:  Portable chest x-ray of September 02, 2014  FINDINGS: The large caliber right chest tube has been removed. The pigtail catheter remains in place with the pigtail located in the medial costophrenic gutter. There is stable increased density at the right lung base consistent with pleural fluid and/or pleural thickening. There is no pneumothorax. The left lung is clear. The heart and mediastinal structures are normal. The observed bony thorax is unremarkable.  IMPRESSION: Interval removal of the large caliber chest tube with stable positioning of the small caliber tube. There is stable increased density at the right lung base consistent with pleural fluid or thickening and likely atelectasis.   Electronically Signed   By: David  Martinique M.D.   On: 09/03/2014 07:27   Dg Chest Port 1 View  09/02/2014   CLINICAL DATA:   Chest tube treatment of right pleural effusion  EXAM: PORTABLE CHEST - 1 VIEW  COMPARISON:  Chest x-ray of August 31, 2014  FINDINGS: The large caliber chest tube tip on the right projects over the posterior aspect of the seventh rib. The new small caliber tube has its pigtail overlying the medial costovertebral angle on the right. There is persistent right lower lobe atelectasis. The left lung is clear. There is no mediastinal shift. There is no pneumothorax. A small amount of pleural fluid likely remains on the right. The heart is normal in size. The pulmonary vascularity is mildly prominent centrally. The bony thorax exhibits no acute abnormality.  IMPRESSION: Stable appearance of the right hemithorax with the presence of 2 chest tubes. There remains parenchymal consolidation as  well as small amount of pleural fluid on the right.   Electronically Signed   By: David  Martinique M.D.   On: 09/02/2014 07:41   Dg Swallowing Func-speech Pathology  09/01/2014    Objective Swallowing Evaluation:    Patient Details  Name: Johnathan Strong MRN: [id number] Date of Birth: [birth year]  Today's Date: 09/01/2014 Time: SLP Start Time (ACUTE ONLY): 1245-SLP Stop Time (ACUTE ONLY): 1320 SLP Time Calculation (min) (ACUTE ONLY): 35 min  Past Medical History:  Past Medical History  Diagnosis Date  . Osteopenia 2006  . Hyperlipidemia   . BPH (benign prostatic hypertrophy)   . Transient global amnesia   . Osteoarthritis   . History of TIA (transient ischemic attack) 1980'S    NO RESIDUAL  . COPD (chronic obstructive pulmonary disease) with emphysema   . Chronic back pain   . Itching SECONDARY TO CLL-- CONTROLLED W/ SINGULAIR  . Frequency   . Nocturia   . Hypertension CARDIOLOGIST- DR BRACKBILL-- LAST VISIT 12-15-2010 NOTE IN  EPIC  . CLL (chronic lymphocytic leukemia) LAST PLT COUNT APRL 2012  168    Managed at [facility]  . Bladder cancer   . Aspirin allergy     Eyes swell  . Asthma   . Dyspnea on exertion   . CAD (coronary  artery disease)     a. 08/2012 Cath: LM nl, LAD 90p, LCX 20-30, RCA 100ost fills via L->R  collats, EF 55-65%;  b. 09/2012 PCI of prox LAD with 3.0x16 Promus DES.   Past Surgical History:  Past Surgical History  Procedure Laterality Date  . Excisional hemorrhoidectomy  1980  . Cardiovascular stress test  07/2005    a. 07/2005- no reversible ischemia, normal EF b. 06/2009- EF 73%, no  reversible ischemia, inferolateral TWIs at rest, upright in recovery c.  08/2012- mediuim-sized partially reversible basal to mid inferior and  inferoseptal perfusion defect c/w with prior infarction and peri-infarct  ischemia; EF 68% and borderline ST/T changes on stress ECG   . Transurethral resection of bladder tumor  10-19-2008    AND DILATION URETHRAL STRICTURE  . Cataract extraction w/ intraocular lens  implant, bilateral Bilateral ~  2007  . Transthoracic echocardiogram  01-03-2011    LVEF 70-35%, grade 1 diastolic dysfunction, no WMAs or structural  abnormalities  . Cystoscopy with biopsy  10/30/2011    Procedure: CYSTOSCOPY WITH BIOPSY;  Surgeon: Claybon Jabs, MD;   Location: [facility];  Service: Urology;  Laterality: N/A;   . Cardiac catheterization  08/27/2012  . Coronary angioplasty with stent placement  09/26/2012    "1" (09/26/2012)  . Coronary stent placement  09/26/12  . Left heart catheterization with coronary angiogram N/A 08/27/2012    Procedure: LEFT HEART CATHETERIZATION WITH CORONARY ANGIOGRAM;  Surgeon:  Peter M Martinique, MD;  Location: [facility] CATH LAB;  Service: Cardiovascular;   Laterality: N/A;  . Percutaneous coronary stent intervention (pci-s) N/A 09/26/2012    Procedure: PERCUTANEOUS CORONARY STENT INTERVENTION (PCI-S);  Surgeon:  Peter M Martinique, MD;  Location: [facility] CATH LAB;  Service: Cardiovascular;   Laterality: N/A;   HPI:  Other Pertinent Information: 81 y.o. male with past history of COPD,  paroxysmal A. fib on Eliquis, CAD, hypertension was recently discharged  from [facility] 6/9 after  treatment for community-acquired  pneumonia. He presented to the ER where he was noted to have a white count  of 24K, chest x-ray with worsening pneumonia and moderate  right pleural  effusion. Pt with chest tube.Previous admission for RLL PNA  with bedside  swallow completed 6/9: notes WFL oropharyngeal swallow with suspected  esophageal component.   No Data Recorded  Assessment / Plan / Recommendation CHL IP CLINICAL IMPRESSIONS 09/01/2014  Therapy Diagnosis Mild oral phase dysphagia;Suspected primary esophageal  dysphagia  Clinical Impression Pt demonstrates very mild oral dysphagia warranting  basic precautions to reduce risk of aspiration. The pt has trace premature  spillage and trace oral residuals after the swallow, both of which spill  to the valleculae and pyriforms prior to the swallow with 1-2 instances of  trace penetration, expelled with a swallow. Oropharyngeal function is  otherwise WNL. An esophageal sweep does reveal stasis throughout the  distal to mid esophagus with upward movement of residual. The barium  tablet remained at the GE junction at the end of the test. Pt is  recommended to follow precautions to reduce risk of aspiration including  upright positioning, a second swallow and staying upright 30-60 minutes  after meal. Despite the precautions the pts highest risk is aspiration of  esophageal stasis after meals. Suggest f/u esophageal testing. SLP will  f/u for reinforcement of precautions.       CHL IP TREATMENT RECOMMENDATION 09/01/2014  Treatment Recommendations Therapy as outlined in treatment plan below     CHL IP DIET RECOMMENDATION 09/01/2014  SLP Diet Recommendations Age appropriate regular solids;Thin  Liquid Administration via (None)  Medication Administration Whole meds with liquid  Compensations Small sips/bites  Postural Changes and/or Swallow Maneuvers (None)     CHL IP OTHER RECOMMENDATIONS 09/01/2014  Recommended Consults Consider esophageal assessment;Consider GI evaluation    Oral Care Recommendations Oral care BID  Other Recommendations (None)     No flowsheet data found.   CHL IP FREQUENCY AND DURATION 09/01/2014  Speech Therapy Frequency (ACUTE ONLY) min 2x/week  Treatment Duration 1 week       No flowsheet data found.  No flowsheet data found.        Herbie [place], [place] CCC-SLP [phone]  DeBlois, Katherene Ponto 09/01/2014, 2:57 PM    Ct Image Guided Drainage By Percutaneous Catheter  09/01/2014   INDICATION: History of community acquired pneumonia, post right-sided chest tube placement with residual loculated fluid seen within the caudal aspect of the right pleural space. Request made for placement of additional CT-guided percutaneous chest tube for infection source control purposes.  EXAM: CT IMAGE GUIDED DRAINAGE BY PERCUTANEOUS CATHETER  COMPARISON:  Chest CT- 08/10/2014; 08/25/2014; chest radiograph - 08/31/2014; ultrasound-guided right-sided thoracentesis - 08/26/2014  MEDICATIONS: The patient is currently admitted to the hospital and receiving intravenous antibiotics. The antibiotics were administered within an appropriate time frame prior to the initiation of the procedure.  ANESTHESIA/SEDATION: Fentanyl 25 mcg IV; Versed 5 mg IV  Total Moderate Sedation time  20 minutes  CONTRAST:  None  COMPLICATIONS: None immediate  PROCEDURE: Informed written consent was obtained from the patient after a discussion of the risks, benefits and alternatives to treatment. The patient was placed right lateral decubitus on the CT gantry and a pre procedural CT was performed re-demonstrating the known loculated fluid collection within the right pleural space. The procedure was planned. A timeout was performed prior to the initiation of the procedure.  The right inferior posterior lateral chest wall was prepped and draped in the usual sterile fashion. The overlying soft tissues were anesthetized with 1% lidocaine with epinephrine. Appropriate trajectory was planned with the use of a 22 gauge  spinal  needle. An 18 gauge trocar needle was advanced into the right pleural space and a short Amplatz super stiff wire was coiled within the collection. Appropriate positioning was confirmed with a limited CT scan. The tract was serially dilated allowing placement of a 12 [place] all-purpose drainage catheter. Appropriate positioning was confirmed with a limited postprocedural CT scan demonstrating the and of the percutaneous drainage catheter coiled and locked within the posterior medial aspect of the right pleural space (series 7).  Approximately 50 cc blood tinged pleural fluid was aspirated. The tube was connected to a pleural vac device and sutured in place. A dressing was placed. The patient tolerated the procedure well without immediate post procedural complication.  IMPRESSION: Successful CT guided placement of a 51 French all purpose drain catheter into the caudal aspect of the right pleural space with aspiration of approximately 50 mL of blood tinged pleural fluid. Samples were sent to the laboratory as requested by the ordering clinical team.   Electronically Signed   By: Sandi Mariscal M.D.   On: 09/01/2014 16:50    Labs:  CBC:  Recent Labs  08/31/14 0320 09/01/14 0517 09/02/14 0316 09/03/14 0453  WBC 21.0* 20.1* 16.5* 14.8*  HGB 10.1* 9.4* 8.7* 8.5*  HCT 30.2* 27.9* 26.6* 26.1*  PLT 231 246 230 287    COAGS:  Recent Labs  04/23/14 1415  INR 1.17  APTT 30    BMP:  Recent Labs  08/31/14 0320 09/01/14 0517 09/02/14 0316 09/03/14 0453  NA 129* 128* 132* 132*  K 5.0 4.7 4.9 4.5  CL 100* 100* 101 103  CO2 21* 23 21* 24  GLUCOSE 110* 113* 103* 107*  BUN 29* 30* 29* 28*  CALCIUM 7.9* 7.9* 7.9* 8.0*  CREATININE 1.24 1.12 1.21 1.22  GFRNONAA 50* 56* 51* 51*  GFRAA 58* >60 59* 59*    LIVER FUNCTION TESTS:  Recent Labs  04/21/14 1541 08/10/14 0658 08/25/14 0700 08/26/14 0448  BILITOT 1.1 0.7 1.5* 1.4*  AST 24 44* 19 13*  ALT 18 58 18 14*  ALKPHOS 55 66 72 66    PROT 6.6 5.5* 6.3* 5.8*  ALBUMIN 4.5 3.0* 3.8 3.2*    Assessment and Plan:  S/P right pigtail chest drain placement on 6/28, looks good, draining well.  WBC has improved. Cultures pending.  Care per Dr. Loa Socks  Will continue to follow  Signed: [facility] S BLAIR PA-C 09/03/2014, 10:04 AM   I spent a total of 15 Minutes in face to face in clinical consultation/evaluation, greater than 50% of which was counseling/coordinating care for right chest pigtail drain.

## 2014-09-03 NOTE — Progress Notes (Signed)
[facility] PROGRESS NOTE  Johnathan Strong CWC:[id number] DOB: [birth year] DOA: 08/25/2014 PCP: Redge Gainer, MD  Interim Summary Patient is a pleasant 81 year old gentleman with a past medical history of CML, recently discharged from the medicine service on 08/19/2014, admitted on 08/09/2014 during which he was treated for community-acquired pneumonia. Patient showed gradual improvement as he was weaned off of supplemental oxygen, tolerating by mouth intake, and was discharged on Ceftin. His wife reporting that patient did well the week after discharge, got back to his normal routine, then had a decline this past Monday, developing fevers, chills, night sweats. Chest x-ray performed on admission revealed moderate right pleural effusion with associated right lung base consolidation/collapse. He was started on broad-spectrum IV antibiotic therapy with vancomycin and ceftazidime, undergoing ultrasound-guided thoracentesis by interventional radiology on 08/26/2014. Pulmonary critical care medicine was consulted regarding pleural effusion as it was felt he would benefit chest tube. PCCM placed a 32 French chest tube on 08/27/2014 (rather than wayne cath) since there was marked fibrinous tissue and concern for loculation. Subsequent imagining showed incomplete drainage for which TPA was injected into pleural space. This seemed to help initially however subsequent injections did not yield significant fluid. PCCM recommended proceeding with IR consult for PIG-tail catheter, which was performed on 09/01/2014. Hospitalization was complicated by the moment of A. fib with RVR on 08/27/2014 likely precipitated by underlying pulmonary infectious process and acute hypoxemic respiratory failure. Controlling Afib has been a challenge. Dr Coralyn Pear consulted cardiology on 09/01/2014.   Assessment/Plan: #1 healthcare associated pneumonia with probable parapneumonic effusion Patient recently discharged on 08/19/2014 from the  hospital after being treated for community-acquired pneumonia who presented with a probable parapneumonic effusion. Patient underwent ultrasound-guided thoracentesis by interventional radiology with a liter of yellow colored fluid removed with a cell count of 10,299. Thoracentesis was consistent with exudative characteristics. No organisms seen on Gram stain. Blood cultures pending. Afebrile. WBC trending down. Patient also had chest tube placed on 08/27/2014 with 1200 mL output in all 625 and 626 minimal output. On 09/01/2014 posterior 12 French chest tube was placed per IR. 09/02/2014 patient had right lateral chest tube removed per critical care. Patient is afebrile. WBC trending down. Treatment options were discussed with patient and family including drain catheter versus repeat thoracentesis. VATS procedure was felt not to be well tolerated due to his multiple comorbidities, advanced age, A. fib with RVR. IV Tressie Ellis has been changed to oral levaquin. Pulmonary medicine gave TPA injections into pleural space due to amount of fibrous material and incomplete fluid drainage from the chest tube. Patient did have a repeat chest CT that showed interval improvement in right-sided pleural effusion. Patient was evaluated by Dr. Roxan Hockey of cardiothoracic surgery who did not recommend a VATS procedure. Patient had pigtail catheter placement done on 09/01/2014 per interventional radiology. Clinical improvement. Continue current therapy. Pulmonary critical care following.  #2 acute hypoxemic respiratory failure Likely multifactorial secondary to problem #1. Improving.  #3 A. fib with RVR CHADS2VASC score 6. Patient was noted to have heart rates in the 120s to 140s 2 days ago.Felt to be secondary to patient's pulmonary process. Patient has been seen in consultation by cardiology were adjusting patient's rate controlling medications. Continue metoprolol 25 mg 3 times a day. Diltiazem has been decreased to 240 mg  daily per cardiology as patient was noted to be dizzy and hypotensive yesterday. Per cardiology. Pulmonary to advise when anticoagulation may be resumed.  #4 coronary artery disease status post DES to the LAD in  2014 Stable. Continue beta blocker, statin. Plavix on hold. Per cardiology.  #5 sepsis Noted on admission as patient had a white count of 24.2, respiratory rate of 33 heart rate of 107 with pulse of infection being healthcare associated pneumonia. Blood cultures pending with no growth to date. Pleural fluid cultures pending. Continue empiric antibiotics.   #6 hypertension Due to hypotension yesterday Cardizem dose was decreased back to his home regimen. Continue metoprolol. Cozaar on hold.   #7 acute kidney injury Improvement. Stable. Follow.  #8 CLL Outpatient follow-up.  #9 prophylaxis SCDs for DVT prophylaxis.  Code Status: Full Family Communication: Updated patient, daughter at bedside. Disposition Plan: Remain inpatient  Consultants:  Cardiology: Dr. Haroldine Laws 09/01/2014  CT surgery Dr. Roxan Hockey 08/28/2014  Critical care medicine Dr. Alva Garnet 08/26/2014  Procedures:  Ultrasound-guided thoracentesis 08/26/2014  CT guided drainage catheter placement into caudal aspect of right pleural space per Dr. Pascal Lux 09/01/2014  Right chest tube placement 08/27/2014  CT chest 08/30/2014, 08/25/2014  Antibiotics:  IV Fortaz 08/25/2014>>>>>>09/03/14  IV vancomycin 08/25/2014>>>> 09/01/2014  Oral levaquin 09/03/14  HPI/Subjective: Patient states he's feeling better. Patient states was able to ambulate in hallway.  Objective: Filed Vitals:   09/03/14 1245  BP: 111/55  Pulse: 86  Temp:   Resp: 25    Intake/Output Summary (Last 24 hours) at 09/03/14 1630 Last data filed at 09/03/14 1330  Gross per 24 hour  Intake    990 ml  Output    875 ml  Net    115 ml   Filed Weights   09/01/14 0445 09/02/14 0400 09/03/14 0400  Weight: 77.61 kg (171 lb 1.6 oz) 75.07  kg (165 lb 8 oz) 73.573 kg (162 lb 3.2 oz)    Exam:   General:  NAD  Cardiovascular: Irregularly irregular  Respiratory: Decreased breath sounds in the right base, otherwise clear. Right chest tube has been removed. Pigtail catheter in place.  Abdomen: Soft, nontender, nondistended, positive bowel sounds.  Musculoskeletal: No clubbing cyanosis or edema.  Data Reviewed: Basic Metabolic Panel:  Recent Labs Lab 08/30/14 0410 08/31/14 0320 09/01/14 0517 09/02/14 0316 09/03/14 0453  NA 130* 129* 128* 132* 132*  K 4.5 5.0 4.7 4.9 4.5  CL 98* 100* 100* 101 103  CO2 24 21* 23 21* 24  GLUCOSE 104* 110* 113* 103* 107*  BUN 36* 29* 30* 29* 28*  CREATININE 1.29* 1.24 1.12 1.21 1.22  CALCIUM 8.2* 7.9* 7.9* 7.9* 8.0*  MG  --   --   --   --  2.0   Liver Function Tests: No results for input(s): AST, ALT, ALKPHOS, BILITOT, PROT, ALBUMIN in the last 168 hours. No results for input(s): LIPASE, AMYLASE in the last 168 hours. No results for input(s): AMMONIA in the last 168 hours. CBC:  Recent Labs Lab 08/30/14 0410 08/31/14 0320 09/01/14 0517 09/02/14 0316 09/03/14 0453  WBC 16.1* 21.0* 20.1* 16.5* 14.8*  HGB 10.1* 10.1* 9.4* 8.7* 8.5*  HCT 30.2* 30.2* 27.9* 26.6* 26.1*  MCV 87.8 88.6 87.2 89.9 88.8  PLT 235 231 246 230 287   Cardiac Enzymes: No results for input(s): CKTOTAL, CKMB, CKMBINDEX, TROPONINI in the last 168 hours. BNP (last 3 results)  Recent Labs  04/21/14 1541 08/25/14 0700  BNP 484.0* 224.5*    ProBNP (last 3 results) No results for input(s): PROBNP in the last 8760 hours.  CBG: No results for input(s): GLUCAP in the last 168 hours.  Recent Results (from the past 240 hour(s))  Culture, blood (routine x  2)     Status: None   Collection Time: 08/25/14  7:00 AM  Result Value Ref Range Status   Specimen Description BLOOD RIGHT ANTECUBITAL  Final   Special Requests BAA 10CC  Final   Culture NO GROWTH 5 DAYS  Final   Report Status 08/30/2014 FINAL   Final  Culture, blood (routine x 2)     Status: None   Collection Time: 08/25/14  7:29 AM  Result Value Ref Range Status   Specimen Description BLOOD RIGHT ANTECUBITAL  Final   Special Requests BOTTLES DRAWN AEROBIC AND ANAEROBIC 5CC  Final   Culture NO GROWTH 5 DAYS  Final   Report Status 08/30/2014 FINAL  Final  Culture, body fluid-bottle     Status: None   Collection Time: 08/26/14 11:09 AM  Result Value Ref Range Status   Specimen Description FLUID RIGHT PLEURAL  Final   Special Requests NONE  Final   Culture NO GROWTH 5 DAYS  Final   Report Status 08/31/2014 FINAL  Final  Gram stain     Status: None   Collection Time: 08/26/14 11:09 AM  Result Value Ref Range Status   Specimen Description FLUID RIGHT PLEURAL  Final   Special Requests NONE  Final   Gram Stain   Final    ABUNDANT WBC PRESENT, PREDOMINANTLY PMN NO ORGANISMS SEEN    Report Status 08/26/2014 FINAL  Final  Culture, body fluid-bottle     Status: None (Preliminary result)   Collection Time: 09/01/14  3:45 PM  Result Value Ref Range Status   Specimen Description PLEURAL FLUID  Final   Special Requests NONE  Final   Culture NO GROWTH 2 DAYS  Final   Report Status PENDING  Incomplete  Gram stain     Status: None   Collection Time: 09/01/14  3:45 PM  Result Value Ref Range Status   Specimen Description PLEURAL FLUID  Final   Special Requests NONE  Final   Gram Stain   Final    ABUNDANT WBC PRESENT,BOTH PMN AND MONONUCLEAR NO ORGANISMS SEEN    Report Status 09/02/2014 FINAL  Final     Studies: Dg Chest Port 1 View  09/03/2014   CLINICAL DATA:  Respiratory failure, right-sided chest tube  EXAM: PORTABLE CHEST - 1 VIEW  COMPARISON:  Portable chest x-ray of September 02, 2014  FINDINGS: The large caliber right chest tube has been removed. The pigtail catheter remains in place with the pigtail located in the medial costophrenic gutter. There is stable increased density at the right lung base consistent with pleural fluid  and/or pleural thickening. There is no pneumothorax. The left lung is clear. The heart and mediastinal structures are normal. The observed bony thorax is unremarkable.  IMPRESSION: Interval removal of the large caliber chest tube with stable positioning of the small caliber tube. There is stable increased density at the right lung base consistent with pleural fluid or thickening and likely atelectasis.   Electronically Signed   By: David  Martinique M.D.   On: 09/03/2014 07:27   Dg Chest Port 1 View  09/02/2014   CLINICAL DATA:  Chest tube treatment of right pleural effusion  EXAM: PORTABLE CHEST - 1 VIEW  COMPARISON:  Chest x-ray of August 31, 2014  FINDINGS: The large caliber chest tube tip on the right projects over the posterior aspect of the seventh rib. The new small caliber tube has its pigtail overlying the medial costovertebral angle on the right. There is persistent right lower  lobe atelectasis. The left lung is clear. There is no mediastinal shift. There is no pneumothorax. A small amount of pleural fluid likely remains on the right. The heart is normal in size. The pulmonary vascularity is mildly prominent centrally. The bony thorax exhibits no acute abnormality.  IMPRESSION: Stable appearance of the right hemithorax with the presence of 2 chest tubes. There remains parenchymal consolidation as well as small amount of pleural fluid on the right.   Electronically Signed   By: David  Martinique M.D.   On: 09/02/2014 07:41    Scheduled Meds: . atorvastatin  10 mg Oral q1800  . calcium carbonate  1 tablet Oral BID  . digoxin  0.25 mg Intravenous Q6H  . [START ON 09/04/2014] digoxin  0.125 mg Oral Daily  . diltiazem  240 mg Oral Daily  . feeding supplement (ENSURE ENLIVE)  237 mL Oral TID BM  . [START ON 09/05/2014] levofloxacin  500 mg Oral Q48H  . metoprolol tartrate  25 mg Oral TID  . montelukast  10 mg Oral QHS  . polyethylene glycol  17 g Oral Daily  . senna-docusate  1 tablet Oral BID   Continuous  Infusions: . sodium chloride 10 mL/hr at 09/01/14 1204    Principal Problem:   HCAP (healthcare-associated pneumonia) Active Problems:   Pleural effusion associated with pulmonary infection   CAD (coronary artery disease)   Atrial fibrillation   Chronic lymphatic leukemia   Pleural effusion on right   Sepsis   Pleural effusion   Pleural effusion, right   Blood poisoning   Chest tube in place   SOB (shortness of breath)    Time spent: 35 minutes    [facility] MD [facility] Pager [phone]. If 7PM-7AM, please contact night-coverage at [email or web address], password [facility] 09/03/2014, 4:30 PM  LOS: 9 days

## 2014-09-03 NOTE — Progress Notes (Addendum)
PULMONARY/CCM NOTE  Requesting MD/Service: August Saucer Date of admission: 6/21 Date of consult: 6/22 Reason for consultation:  parapneumonic effusion  Pt Profile:  65 M admitted to [facility] 6/04 - 6/09 with CAP. CXR on 6/09 revealed R effusion. He failed to recover as anticipated after discharge and readmitted 6/21 with large R effusion. Thoracentesis performed 6/22 revealed exudative characteristics (LDH 341, protein 3.7, with > 10,000 WBC, 93% neutrophils). Fluid was described as bloody. Pulm medicine asked to assist in further eval and mgmt of parapneumonic effusion   Significant tests/ events 6/23  A. fib RVR- transferred to stepdown unit 6/23 [place] of right chest - marked fibrinous tissue and concern for loculation t/o the right chest wall Rt chest tube 6/23 >>TPA 6/24: 1200 ml output, on 6/25 and 6/26 minimal output.  CT chest 6/26: Interval improvement in a moderate size right pleural fluid collection with associated compressive atelectasis over the right middle and lower lobes. Right-sided chest tube in with tip over the posterior pleural space in the most superior aspect of the pleural fluid collection. 6/28: posterior 12 fr chest tube placed in IR 6/28: pleural fluid culture>>> 6/29: right lateral 32 fr CT removed.    Subjective- HR 156   Filed Vitals:   09/03/14 0400 09/03/14 0730 09/03/14 0900 09/03/14 0940  BP: 149/48 155/81  103/65  Pulse: 95 69    Temp: 99 F (37.2 C)     TempSrc:      Resp: 23 16  19   Height:      Weight: 162 lb 3.2 oz (73.573 kg)     SpO2: 100% 97% 98%     EXAM:  Gen: WDWN, able to speak in full paragraphs HEENT: no jvd Neck: no LAN Lungs: decreased RLL . Posterior 12 fr pigtail: no air leak, 50 cc over 24 hours Cardiovascular: IRIR, no M noted VR 156 Abdomen: soft, NT, +BS Ext: no C/C/E Neuro: awake, alert  DATA:  BMET    Component Value Date/Time   NA 132* 09/03/2014 0453   NA 144 04/03/2014 1023   K 4.5 09/03/2014 0453   CL 103  09/03/2014 0453   CO2 24 09/03/2014 0453   GLUCOSE 107* 09/03/2014 0453   GLUCOSE 94 04/03/2014 1023   BUN 28* 09/03/2014 0453   BUN 16 04/03/2014 1023   CREATININE 1.22 09/03/2014 0453   CALCIUM 8.0* 09/03/2014 0453   GFRNONAA 51* 09/03/2014 0453   GFRAA 59* 09/03/2014 0453    CBC    Component Value Date/Time   WBC 14.8* 09/03/2014 0453   WBC 9.5 04/03/2014 1038   RBC 2.94* 09/03/2014 0453   RBC 4.2* 04/03/2014 1038   HGB 8.5* 09/03/2014 0453   HGB 12.0* 04/03/2014 1038   HCT 26.1* 09/03/2014 0453   HCT 40.0* 04/03/2014 1038   PLT 287 09/03/2014 0453   MCV 88.8 09/03/2014 0453   MCV 95.4 04/03/2014 1038   MCH 28.9 09/03/2014 0453   MCH 28.6 04/03/2014 1038   MCHC 32.6 09/03/2014 0453   MCHC 30.0* 04/03/2014 1038   RDW 23.5* 09/03/2014 0453   LYMPHSABS 5.6* 08/25/2014 0700   MONOABS 1.0 08/25/2014 0700   EOSABS 0.0 08/25/2014 0700   BASOSABS 0.0 08/25/2014 0700    Chest tube- 50 cc 6/30  CT scan 6/26- images reviewed pending Radiology report. There is residual loculation. Tube placement ok.  CXR 6/30 with large bore ct out , rt base with atx /effusion  IMPRESSION:   1) HCAP (healthcare-associated pneumonia) 2) Chronic lymphatic leukemia  3) R  effusion  parapneumonic, complex.  >D/c'd lateral 32 fr CT today 6/29 as no output for 24 hrs. 12 fr Pigtail placed 6/29. 31ml blood tinged Pleural fluid. CT still w/ slow output. CXR and clinical status cont to slowly improve.  >Leukocytosis trending down PLAN:  F/u posterior pigtail output. 6/30 <50 cc x 24 hrs May be able to dc chest tube 24-48 hrs will check pleural fluid LDH, monitor drainage F/u pending cultures/no growth Cont fortaz for now. Should be able to transition to oral soon. Will d/w team course of therapy.  Mobilize  AF w/ RVR, anemia, hyponatremia  Plan Per IM and cards service.  RVR continues to be an issue, dig started 6/30.  He is very frail.  Richardson Landry Minor ACNP [facility] Shape PCCM Pager [phone] till  3 pm If no answer page [phone] 09/03/2014, 10:10 AM  Attending Note:  81 year old with PNA and associated pleural effusion.  DNR status.  Chest tube removed yesterday.  Pig tail still in place given output.  CXR improving but patient is very frail.  I reviewed CXR myself, improvement in pleural effusion and drain in good position.  Discussed with PCCM-NP.  HCAP:  - Continue abx as ordered.  - Final fluid cultures pending.  - F/u on cultures.  Pleural effusion: parapneumonic.  - Continue pig tail drainage.  - Daily CXR.  - Continue abx.  A-fib with RVR, remains an issue.  - Digoxin as ordered.  - No anti-coagulation.  Hypoxemia due to PNA.  - Titrate O2 for sat of 88-92%.  - Will need ambulatory desaturation prior to discharge to qualify for home O2.  Patient seen and examined, agree with above note.  I dictated the care and orders written for this patient under my direction.  Rush Farmer, MD [phone]

## 2014-09-04 ENCOUNTER — Inpatient Hospital Stay (HOSPITAL_COMMUNITY): Payer: Medicare Other

## 2014-09-04 LAB — BASIC METABOLIC PANEL
Anion gap: 7 (ref 5–15)
BUN: 24 mg/dL — AB (ref 6–20)
CO2: 26 mmol/L (ref 22–32)
Calcium: 8.4 mg/dL — ABNORMAL LOW (ref 8.9–10.3)
Chloride: 101 mmol/L (ref 101–111)
Creatinine, Ser: 1.23 mg/dL (ref 0.61–1.24)
GFR calc Af Amer: 58 mL/min — ABNORMAL LOW (ref >60)
GFR calc non Af Amer: 50 mL/min — ABNORMAL LOW (ref >60)
GLUCOSE: 101 mg/dL — AB (ref 65–99)
POTASSIUM: 5 mmol/L (ref 3.5–5.1)
Sodium: 134 mmol/L — ABNORMAL LOW (ref 135–145)

## 2014-09-04 LAB — CBC
HEMATOCRIT: 27.9 % — AB (ref 39.0–52.0)
Hemoglobin: 9 g/dL — ABNORMAL LOW (ref 13.0–17.0)
MCH: 28.7 pg (ref 26.0–34.0)
MCHC: 32.3 g/dL (ref 30.0–36.0)
MCV: 88.9 fL (ref 78.0–100.0)
Platelets: 296 10*3/uL (ref 150–400)
RBC: 3.14 MIL/uL — AB (ref 4.22–5.81)
RDW: 23.3 % — ABNORMAL HIGH (ref 11.5–15.5)
WBC: 16.9 10*3/uL — ABNORMAL HIGH (ref 4.0–10.5)

## 2014-09-04 LAB — PATHOLOGIST SMEAR REVIEW

## 2014-09-04 MED ORDER — DIGOXIN 0.25 MG/ML IJ SOLN
0.2500 mg | Freq: Once | INTRAMUSCULAR | Status: AC
  Administered 2014-09-04: 0.25 mg via INTRAVENOUS
  Filled 2014-09-04: qty 1

## 2014-09-04 MED ORDER — METOPROLOL TARTRATE 50 MG PO TABS
50.0000 mg | ORAL_TABLET | Freq: Two times a day (BID) | ORAL | Status: DC
  Administered 2014-09-04 – 2014-09-15 (×23): 50 mg via ORAL
  Filled 2014-09-04 (×9): qty 1
  Filled 2014-09-04: qty 2
  Filled 2014-09-04 (×13): qty 1

## 2014-09-04 NOTE — Progress Notes (Signed)
PULMONARY/CCM NOTE  Requesting MD/Service: August Saucer Date of admission: 6/21 Date of consult: 6/22 Reason for consultation:  parapneumonic effusion  Pt Profile:  14 M admitted to [facility] 6/04 - 6/09 with CAP. CXR on 6/09 revealed R effusion. He failed to recover as anticipated after discharge and readmitted 6/21 with large R effusion. Thoracentesis performed 6/22 revealed exudative characteristics (LDH 341, protein 3.7, with > 10,000 WBC, 93% neutrophils). Fluid was described as bloody. Pulm medicine asked to assist in further eval and mgmt of parapneumonic effusion   Significant tests/ events 6/23  A. fib RVR- transferred to stepdown unit 6/23 [place] of right chest - marked fibrinous tissue and concern for loculation t/o the right chest wall Rt chest tube 6/23 >>TPA 6/24: 1200 ml output, on 6/25 and 6/26 minimal output.  CT chest 6/26: Interval improvement in a moderate size right pleural fluid collection with associated compressive atelectasis over the right middle and lower lobes. Right-sided chest tube in with tip over the posterior pleural space in the most superior aspect of the pleural fluid collection. 6/28: posterior 12 fr chest tube placed in IR 6/28: pleural fluid culture>>> 6/29: right lateral 32 fr CT removed.    Subjective- HR 156   Filed Vitals:   09/04/14 0021 09/04/14 0456 09/04/14 0928 09/04/14 1147  BP: 125/85 150/75 145/78 129/78  Pulse: 106 137 144   Temp: 98.3 F (36.8 C) 98.3 F (36.8 C)  98 F (36.7 C)  TempSrc: Oral Oral  Oral  Resp: 22 21    Height:      Weight:  167 lb 14.4 oz (76.159 kg)    SpO2: 97% 95%  92%    EXAM:  Gen: WDWN, able to speak in full paragraphs HEENT: no jvd Neck: no LAN Lungs: decreased RLL . Posterior 12 fr pigtail: no air leak, 90 cc over 24 hours Cardiovascular: IRIR, no M noted VR 144 Abdomen: soft, NT, +BS Ext: no C/C/E Neuro: awake, alert  DATA:  BMET    Component Value Date/Time   NA 134* 09/04/2014 0343   NA 144  04/03/2014 1023   K 5.0 09/04/2014 0343   CL 101 09/04/2014 0343   CO2 26 09/04/2014 0343   GLUCOSE 101* 09/04/2014 0343   GLUCOSE 94 04/03/2014 1023   BUN 24* 09/04/2014 0343   BUN 16 04/03/2014 1023   CREATININE 1.23 09/04/2014 0343   CALCIUM 8.4* 09/04/2014 0343   GFRNONAA 50* 09/04/2014 0343   GFRAA 58* 09/04/2014 0343    CBC    Component Value Date/Time   WBC 16.9* 09/04/2014 0343   WBC 9.5 04/03/2014 1038   RBC 3.14* 09/04/2014 0343   RBC 4.2* 04/03/2014 1038   HGB 9.0* 09/04/2014 0343   HGB 12.0* 04/03/2014 1038   HCT 27.9* 09/04/2014 0343   HCT 40.0* 04/03/2014 1038   PLT 296 09/04/2014 0343   MCV 88.9 09/04/2014 0343   MCV 95.4 04/03/2014 1038   MCH 28.7 09/04/2014 0343   MCH 28.6 04/03/2014 1038   MCHC 32.3 09/04/2014 0343   MCHC 30.0* 04/03/2014 1038   RDW 23.3* 09/04/2014 0343   LYMPHSABS 5.6* 08/25/2014 0700   MONOABS 1.0 08/25/2014 0700   EOSABS 0.0 08/25/2014 0700   BASOSABS 0.0 08/25/2014 0700    Chest tube- 50 cc 6/30  CT scan 6/26- images reviewed pending Radiology report. There is residual loculation. Tube placement ok.  CXR 6/30 with large bore ct out , rt base with atx /effusion  IMPRESSION:   1) HCAP (  healthcare-associated pneumonia) 2) Chronic lymphatic leukemia 3) R  effusion  parapneumonic, complex.  >D/c'd lateral 32 fr CT today 6/29 as no output for 24 hrs. 12 fr Pigtail placed 6/29. 374ml blood tinged Pleural fluid. CT still w/ slow output. CXR and clinical status cont to slowly improve.  >Leukocytosis trending down\ 6/30 pleural fluid LDH 3452 PLAN:  F/u posterior pigtail output. 7/1  90 cc x 24 hrs May be able to dc chest tube 24-48 hrs , monitor drainage, or dc with chest tube. F/u pending cultures/no growth On Levaquin  now.. Will d/w team course of therapy.  Mobilize  AF w/ RVR, anemia, hyponatremia  Plan Per IM and cards service.  RVR continues to be an issue, dig started 6/30. ? When to restart anticoagulation.   He is  very frail.  Richardson Landry Minor ACNP [facility] PCCM Pager [phone] till 3 pm If no answer page [phone] 09/04/2014, 12:24 PM  PCCM ATTENDING: I have reviewed pt's initial presentation, consultants notes and hospital database in detail.  The above assessment and plan was formulated under my direction.  In summary: There is now minimal drainage from pleural catheter The repeat pleural fluid analysis revealed markedly elevated LDH but improving WBC count I have spoken to IR and requested removal of catheter as it is doing very little now Hopefully he can resolve this illness and the small amount of remaining pleural fluid with antibiotics alone A repeat CXR has been ordered for 7/02   Merton Border, MD;  PCCM service; Mobile [phone]

## 2014-09-04 NOTE — Progress Notes (Signed)
Patient Name: Johnathan Strong Date of Encounter: 09/04/2014  Primary Cardiologist: Dr. Mare Ferrari   Principal Problem:   HCAP (healthcare-associated pneumonia) Active Problems:   CAD (coronary artery disease)   Atrial fibrillation   Chronic lymphatic leukemia   Pleural effusion on right   Sepsis   Pleural effusion   Pleural effusion, right   Blood poisoning   Chest tube in place   Pleural effusion associated with pulmonary infection   SOB (shortness of breath)    SUBJECTIVE  Had a bad night, some burning sensation over the L shoulder. He was worried about his HR.   CURRENT MEDS . atorvastatin  10 mg Oral q1800  . calcium carbonate  1 tablet Oral BID  . digoxin  0.125 mg Oral Daily  . diltiazem  240 mg Oral Daily  . feeding supplement (ENSURE ENLIVE)  237 mL Oral TID BM  . [START ON 09/05/2014] levofloxacin  500 mg Oral Q48H  . metoprolol tartrate  25 mg Oral TID  . montelukast  10 mg Oral QHS  . polyethylene glycol  17 g Oral Daily  . senna-docusate  1 tablet Oral BID    OBJECTIVE  Filed Vitals:   09/03/14 1245 09/03/14 1952 09/04/14 0021 09/04/14 0456  BP: 111/55 102/83 125/85 150/75  Pulse: 86 129 106 137  Temp:  97.9 F (36.6 C) 98.3 F (36.8 C) 98.3 F (36.8 C)  TempSrc:  Oral Oral Oral  Resp: 25 25 22 21   Height:      Weight:    167 lb 14.4 oz (76.159 kg)  SpO2: 90% 95% 97% 95%    Intake/Output Summary (Last 24 hours) at 09/04/14 0742 Last data filed at 09/04/14 0603  Gross per 24 hour  Intake   1440 ml  Output    534 ml  Net    906 ml   Filed Weights   09/02/14 0400 09/03/14 0400 09/04/14 0456  Weight: 165 lb 8 oz (75.07 kg) 162 lb 3.2 oz (73.573 kg) 167 lb 14.4 oz (76.159 kg)    PHYSICAL EXAM  General: Pleasant, NAD. Neuro: Alert and oriented X 3. Moves all extremities spontaneously. Psych: Normal affect. HEENT:  Normal  Neck: Supple without bruits or JVD. Lungs:  Resp regular and unlabored. Decreased breath sound on right, no obvious  rale.  Heart: irregular. no s3, s4, or murmurs. Abdomen: Soft, non-tender, non-distended, BS + x 4.  Extremities: No clubbing, cyanosis or edema. DP/PT/Radials 2+ and equal bilaterally.  Accessory Clinical Findings  CBC  Recent Labs  09/03/14 0453 09/04/14 0343  WBC 14.8* 16.9*  HGB 8.5* 9.0*  HCT 26.1* 27.9*  MCV 88.8 88.9  PLT 287 825   Basic Metabolic Panel  Recent Labs  09/03/14 0453 09/04/14 0343  NA 132* 134*  K 4.5 5.0  CL 103 101  CO2 24 26  GLUCOSE 107* 101*  BUN 28* 24*  CREATININE 1.22 1.23  CALCIUM 8.0* 8.4*  MG 2.0  --     TELE A-fib with HR 100-140s    ECG  No new EKG  Echocardiogram 04/22/2014  LV EF: 60% -  65%  ------------------------------------------------------------------- Indications:   Dyspnea 786.09.  ------------------------------------------------------------------- History:  PMH:  Coronary artery disease. Chronic obstructive pulmonary disease. Risk factors: Former tobacco use. Hypertension.  ------------------------------------------------------------------- Study Conclusions  - Left ventricle: The cavity size was normal. There was mild concentric hypertrophy. Systolic function was normal. The estimated ejection fraction was in the range of 60% to 65%. Wall motion was  normal; there were no regional wall motion abnormalities. Doppler parameters are consistent with abnormal left ventricular relaxation (grade 1 diastolic dysfunction). Doppler parameters are consistent with high ventricular filling pressure. - Mitral valve: Moderately calcified annulus. There was mild regurgitation. - Left atrium: The atrium was mildly dilated.    Radiology/Studies  Dg Chest Port 1 View  09/03/2014   CLINICAL DATA:  Respiratory failure, right-sided chest tube  EXAM: PORTABLE CHEST - 1 VIEW  COMPARISON:  Portable chest x-ray of September 02, 2014  FINDINGS: The large caliber right chest tube has been removed. The  pigtail catheter remains in place with the pigtail located in the medial costophrenic gutter. There is stable increased density at the right lung base consistent with pleural fluid and/or pleural thickening. There is no pneumothorax. The left lung is clear. The heart and mediastinal structures are normal. The observed bony thorax is unremarkable.  IMPRESSION: Interval removal of the large caliber chest tube with stable positioning of the small caliber tube. There is stable increased density at the right lung base consistent with pleural fluid or thickening and likely atelectasis.   Electronically Signed   By: David  Martinique M.D.   On: 09/03/2014 07:27     ASSESSMENT AND PLAN  1. Recurrent proxysmal atrial firbillation in the setting of severe lung dx - CHA2DS12-Vasc score 6 (hypertension, age above 44, history of TIA, and vascular disease with prior coronary artery stent) - eliquis on hold given chest tube, will discuss with pulmonary service regarding best time to restart systemic anticoagulation. Previously went into PAF during last admission for PNA in early June, reverted back to NSR as he recovered. - rate control continue to be difficult, loaded with digoxin IV 0.25 x 2 dose yesterday, started on 0.125mg  daily today, however HR in 130s. Cannot consider TEE DCCV as long as systemic anticoagulation is not a option as post conversion risk for stroke is high. - will increase metoprolol to 50mg  BID (currently on 25mg  TID which equates to 37.5mg  BID), hopefully higher dose to help with the HR without drop the BP too much. Previously hypotension may be related to dehydration more than medication, will need to monitor BP closely.   2. HCAP: management per IM  3. Pleural effusion s/p chest tube  4. CAD s/p DES to LAD in 2014, also noted to have ostial RCA occlusion with good L to R collateral - continue BB and statin, plavix on hold. Not on  ASA given systemic anticoagulation need  5. COPD 6. CLL followed at [facility] 7. Anemia   Signed, Almyra Deforest PA-C Pager: [phone]  Personally seen and examined. Agree with above. Atrial flutter 2:1 today, 144 Will give additional dig .25 IV x 1 Difficult to control rate with flutter BP stable. I am OK with him walking hallways with assistance. Needs to ambulate to heal.   Agree with increase metoprolol.  Discussed with he and daughter No further left shoulder pain. He is anxious  Candee Furbish, MD

## 2014-09-04 NOTE — Progress Notes (Signed)
Patient heart rate increased to 130-140's after ambulation to the bathroom and he felt short of breath.  Patient o2 sat =100% on 3L.  After resting heart rate decreased to the 90's.  Dr. Marlou Porch notified

## 2014-09-04 NOTE — Progress Notes (Signed)
Referring Physician(s): CCM  Subjective:  Rt loculated effusion pigtail cath placed 6/28 550 cc in pleur vac Blood tinged Feels some better Frustrated---wants fluid to come out quickly CXR no change in 24 hrs   Allergies: Aspirin; Nsaids; Septra; Albuterol; Benicar; Salicylates; and Ampicillin  Medications: Prior to Admission medications   Medication Sig Start Date End Date Taking? Authorizing Provider  albuterol (VENTOLIN HFA) 108 (90 BASE) MCG/ACT inhaler Inhale 2 puffs into the lungs every 6 (six) hours as needed for wheezing.    Yes Historical Provider, MD  Azelastine HCl (ASTELIN NA) Place 1 spray into the nose as needed (for congestion).    Yes Historical Provider, MD  Cholecalciferol (VITAMIN D3) 1000 UNITS CAPS Take 1 capsule by mouth daily.    Yes Historical Provider, MD  cimetidine (TAGAMET) 300 MG tablet TAKE 1 TABLET UP TO 4 TIMES A DAY Patient taking differently: TAKE 1 TABLET UP TO 4 TIMES A DAY as needed 01/02/13  Yes Chipper Herb, MD  clopidogrel (PLAVIX) 75 MG tablet TAKE 1 TABLET ONCE A DAY 03/12/14  Yes [name], MD  diltiazem (CARDIZEM CD) 180 MG 24 hr capsule Take 1 capsule (180 mg total) by mouth daily. 08/13/14  Yes Kelvin Cellar, MD  diphenhydramine-acetaminophen (TYLENOL PM) 25-500 MG TABS Take 1 tablet by mouth at bedtime as needed. sleep   Yes Historical Provider, MD  ELIQUIS 5 MG TABS tablet TAKE  (1)  TABLET TWICE A DAY. 08/14/14  Yes Chipper Herb, MD  fluticasone (FLONASE) 50 MCG/ACT nasal spray USE 2 SPRAYS INTO EACH NOSTRIL ONCE DAILY Patient taking differently: USE 2 SPRAYS INTO EACH NOSTRIL ONCE DAILY as needed for allergies. 07/07/14  Yes Chipper Herb, MD  furosemide (LASIX) 20 MG tablet TAKE (1) TABLET DAILY AS DIRECTED. Patient taking differently: TAKE (1) TABLET DAILY AS DIRECTED as needed for swelling 02/03/14  Yes Chipper Herb, MD  LIPITOR 10 MG tablet TAKE 1 TABLET DAILY Patient taking differently: TAKE 1 TABLET DAILY in the  evening. 04/13/14  Yes Chipper Herb, MD  losartan (COZAAR) 50 MG tablet TAKE (1) TABLET TWICE A DAY. 03/13/14  Yes Chipper Herb, MD  metoprolol tartrate (LOPRESSOR) 25 MG tablet Take 1 tablet (25 mg total) by mouth 2 (two) times daily. 05/19/14  Yes Chipper Herb, MD  montelukast (SINGULAIR) 10 MG tablet TAKE ONE TABLET AT BEDTIME 01/28/14  Yes Chipper Herb, MD  nitroGLYCERIN (NITROSTAT) 0.4 MG SL tablet Place 1 tablet (0.4 mg total) under the tongue every 5 (five) minutes as needed for chest pain. 09/27/12  Yes Rogelia Mire, NP  Omega-3 Fatty Acids (FISH OIL) 1200 MG CAPS Take 1 capsule by mouth daily.    Yes Historical Provider, MD  SPIRIVA HANDIHALER 18 MCG inhalation capsule INHALE THE CONTENTS OF ONE CAPSULE ONCE DAILY AS DIRECTED 05/25/14  Yes Chipper Herb, MD  SYMBICORT 160-4.5 MCG/ACT inhaler 2 PUFFS EVERY 12 HOURS 06/17/14  Yes Chipper Herb, MD  traMADol (ULTRAM) 50 MG tablet TAKE 1 TABLET UP TO 2 TIMES A DAY 07/14/14  Yes Chipper Herb, MD  triamcinolone cream (KENALOG) 0.1 % APPLY TO AFFECTED AREAS 2 TIMES A DAY AS NEEDED 01/02/13  Yes Chipper Herb, MD  cefUROXime (CEFTIN) 250 MG tablet Take 1 tablet (250 mg total) by mouth 2 (two) times daily with a meal. Patient not taking: Reported on 08/25/2014 08/13/14   Kelvin Cellar, MD     Vital Signs: BP 129/78 mmHg  Pulse 144  Temp(Src) 98 F (36.7 C) (Oral)  Resp 21  Ht 5\' 8"  (1.727 m)  Wt 167 lb 14.4 oz (76.159 kg)  BMI 25.54 kg/m2  SpO2 92%  Physical Exam  Pulmonary/Chest: Effort normal. He has no wheezes.  Skin:  Rt chest tube in place 550 cc in pleurvac- blood tinged No air leak CXR no change in 24 hrs  Site clean and dry NT    Imaging: Dg Chest Port 1 View  09/04/2014   CLINICAL DATA:  Followup respiratory failure.  EXAM: PORTABLE CHEST - 1 VIEW  COMPARISON:  09/03/2014  FINDINGS: Persistent right lung base opacity is noted consistent with a combination of pleural fluid and atelectasis. There is minor  persistent subsegmental atelectasis at the left lung base. No pulmonary edema. No pneumothorax.  Cardiopericardial silhouette is normal in size. No mediastinal or hilar masses.  IMPRESSION: 1. No change from the previous day's study. 2. Persistent right basilar opacity most likely combination of atelectasis and pleural fluid. 3. No pneumothorax.   Electronically Signed   By: Lajean Manes M.D.   On: 09/04/2014 08:13   Dg Chest Port 1 View  09/03/2014   CLINICAL DATA:  Respiratory failure, right-sided chest tube  EXAM: PORTABLE CHEST - 1 VIEW  COMPARISON:  Portable chest x-ray of September 02, 2014  FINDINGS: The large caliber right chest tube has been removed. The pigtail catheter remains in place with the pigtail located in the medial costophrenic gutter. There is stable increased density at the right lung base consistent with pleural fluid and/or pleural thickening. There is no pneumothorax. The left lung is clear. The heart and mediastinal structures are normal. The observed bony thorax is unremarkable.  IMPRESSION: Interval removal of the large caliber chest tube with stable positioning of the small caliber tube. There is stable increased density at the right lung base consistent with pleural fluid or thickening and likely atelectasis.   Electronically Signed   By: David  Martinique M.D.   On: 09/03/2014 07:27   Dg Chest Port 1 View  09/02/2014   CLINICAL DATA:  Chest tube treatment of right pleural effusion  EXAM: PORTABLE CHEST - 1 VIEW  COMPARISON:  Chest x-ray of August 31, 2014  FINDINGS: The large caliber chest tube tip on the right projects over the posterior aspect of the seventh rib. The new small caliber tube has its pigtail overlying the medial costovertebral angle on the right. There is persistent right lower lobe atelectasis. The left lung is clear. There is no mediastinal shift. There is no pneumothorax. A small amount of pleural fluid likely remains on the right. The heart is normal in size. The  pulmonary vascularity is mildly prominent centrally. The bony thorax exhibits no acute abnormality.  IMPRESSION: Stable appearance of the right hemithorax with the presence of 2 chest tubes. There remains parenchymal consolidation as well as small amount of pleural fluid on the right.   Electronically Signed   By: David  Martinique M.D.   On: 09/02/2014 07:41   Dg Swallowing Func-speech Pathology  09/01/2014    Objective Swallowing Evaluation:    Patient Details  Name: Johnathan Strong MRN: [id number] Date of Birth: 12-02-1923  Today's Date: 09/01/2014 Time: SLP Start Time (ACUTE ONLY): 1245-SLP Stop Time (ACUTE ONLY): 1320 SLP Time Calculation (min) (ACUTE ONLY): 35 min  Past Medical History:  Past Medical History  Diagnosis Date  . Osteopenia 2006  . Hyperlipidemia   . BPH (benign prostatic hypertrophy)   .  Transient global amnesia   . Osteoarthritis   . History of TIA (transient ischemic attack) 1980'S    NO RESIDUAL  . COPD (chronic obstructive pulmonary disease) with emphysema   . Chronic back pain   . Itching SECONDARY TO CLL-- CONTROLLED W/ SINGULAIR  . Frequency   . Nocturia   . Hypertension CARDIOLOGIST- DR BRACKBILL-- LAST VISIT 12-15-2010 NOTE IN  EPIC  . CLL (chronic lymphocytic leukemia) LAST PLT COUNT APRL 2012  168    Managed at [facility]  . Bladder cancer   . Aspirin allergy     Eyes swell  . Asthma   . Dyspnea on exertion   . CAD (coronary artery disease)     a. 08/2012 Cath: LM nl, LAD 90p, LCX 20-30, RCA 100ost fills via L->R  collats, EF 55-65%;  b. 09/2012 PCI of prox LAD with 3.0x16 Promus DES.   Past Surgical History:  Past Surgical History  Procedure Laterality Date  . Excisional hemorrhoidectomy  1980  . Cardiovascular stress test  07/2005    a. 07/2005- no reversible ischemia, normal EF b. 06/2009- EF 73%, no  reversible ischemia, inferolateral TWIs at rest, upright in recovery c.  08/2012- mediuim-sized partially reversible basal to mid inferior and  inferoseptal perfusion defect c/w  with prior infarction and peri-infarct  ischemia; EF 68% and borderline ST/T changes on stress ECG   . Transurethral resection of bladder tumor  10-19-2008    AND DILATION URETHRAL STRICTURE  . Cataract extraction w/ intraocular lens  implant, bilateral Bilateral ~  2007  . Transthoracic echocardiogram  01-03-2011    LVEF 24-58%, grade 1 diastolic dysfunction, no WMAs or structural  abnormalities  . Cystoscopy with biopsy  10/30/2011    Procedure: CYSTOSCOPY WITH BIOPSY;  Surgeon: Claybon Jabs, MD;   Location: [facility];  Service: Urology;  Laterality: N/A;   . Cardiac catheterization  08/27/2012  . Coronary angioplasty with stent placement  09/26/2012    "1" (09/26/2012)  . Coronary stent placement  09/26/12  . Left heart catheterization with coronary angiogram N/A 08/27/2012    Procedure: LEFT HEART CATHETERIZATION WITH CORONARY ANGIOGRAM;  Surgeon:  Peter M Martinique, MD;  Location: [facility] CATH LAB;  Service: Cardiovascular;   Laterality: N/A;  . Percutaneous coronary stent intervention (pci-s) N/A 09/26/2012    Procedure: PERCUTANEOUS CORONARY STENT INTERVENTION (PCI-S);  Surgeon:  Peter M Martinique, MD;  Location: [facility] CATH LAB;  Service: Cardiovascular;   Laterality: N/A;   HPI:  Other Pertinent Information: 81 y.o. male with past history of COPD,  paroxysmal A. fib on Eliquis, CAD, hypertension was recently discharged  from [facility] 6/9 after treatment for community-acquired  pneumonia. He presented to the ER where he was noted to have a white count  of 24K, chest x-ray with worsening pneumonia and moderate right pleural  effusion. Pt with chest tube.Previous admission for RLL PNA  with bedside  swallow completed 6/9: notes WFL oropharyngeal swallow with suspected  esophageal component.   No Data Recorded  Assessment / Plan / Recommendation CHL IP CLINICAL IMPRESSIONS 09/01/2014  Therapy Diagnosis Mild oral phase dysphagia;Suspected primary esophageal  dysphagia  Clinical Impression Pt demonstrates  very mild oral dysphagia warranting  basic precautions to reduce risk of aspiration. The pt has trace premature  spillage and trace oral residuals after the swallow, both of which spill  to the valleculae and pyriforms prior to the swallow with 1-2 instances of  trace penetration,  expelled with a swallow. Oropharyngeal function is  otherwise WNL. An esophageal sweep does reveal stasis throughout the  distal to mid esophagus with upward movement of residual. The barium  tablet remained at the GE junction at the end of the test. Pt is  recommended to follow precautions to reduce risk of aspiration including  upright positioning, a second swallow and staying upright 30-60 minutes  after meal. Despite the precautions the pts highest risk is aspiration of  esophageal stasis after meals. Suggest f/u esophageal testing. SLP will  f/u for reinforcement of precautions.       CHL IP TREATMENT RECOMMENDATION 09/01/2014  Treatment Recommendations Therapy as outlined in treatment plan below     CHL IP DIET RECOMMENDATION 09/01/2014  SLP Diet Recommendations Age appropriate regular solids;Thin  Liquid Administration via (None)  Medication Administration Whole meds with liquid  Compensations Small sips/bites  Postural Changes and/or Swallow Maneuvers (None)     CHL IP OTHER RECOMMENDATIONS 09/01/2014  Recommended Consults Consider esophageal assessment;Consider GI evaluation   Oral Care Recommendations Oral care BID  Other Recommendations (None)     No flowsheet data found.   CHL IP FREQUENCY AND DURATION 09/01/2014  Speech Therapy Frequency (ACUTE ONLY) min 2x/week  Treatment Duration 1 week       No flowsheet data found.  No flowsheet data found.        Herbie [place], [place] CCC-SLP [phone]  DeBlois, Katherene Ponto 09/01/2014, 2:57 PM    Ct Image Guided Drainage By Percutaneous Catheter  09/01/2014   INDICATION: History of community acquired pneumonia, post right-sided chest tube placement with residual loculated fluid seen within  the caudal aspect of the right pleural space. Request made for placement of additional CT-guided percutaneous chest tube for infection source control purposes.  EXAM: CT IMAGE GUIDED DRAINAGE BY PERCUTANEOUS CATHETER  COMPARISON:  Chest CT- 08/10/2014; 08/25/2014; chest radiograph - 08/31/2014; ultrasound-guided right-sided thoracentesis - 08/26/2014  MEDICATIONS: The patient is currently admitted to the hospital and receiving intravenous antibiotics. The antibiotics were administered within an appropriate time frame prior to the initiation of the procedure.  ANESTHESIA/SEDATION: Fentanyl 25 mcg IV; Versed 5 mg IV  Total Moderate Sedation time  20 minutes  CONTRAST:  None  COMPLICATIONS: None immediate  PROCEDURE: Informed written consent was obtained from the patient after a discussion of the risks, benefits and alternatives to treatment. The patient was placed right lateral decubitus on the CT gantry and a pre procedural CT was performed re-demonstrating the known loculated fluid collection within the right pleural space. The procedure was planned. A timeout was performed prior to the initiation of the procedure.  The right inferior posterior lateral chest wall was prepped and draped in the usual sterile fashion. The overlying soft tissues were anesthetized with 1% lidocaine with epinephrine. Appropriate trajectory was planned with the use of a 22 gauge spinal needle. An 18 gauge trocar needle was advanced into the right pleural space and a short Amplatz super stiff wire was coiled within the collection. Appropriate positioning was confirmed with a limited CT scan. The tract was serially dilated allowing placement of a 12 [place] all-purpose drainage catheter. Appropriate positioning was confirmed with a limited postprocedural CT scan demonstrating the and of the percutaneous drainage catheter coiled and locked within the posterior medial aspect of the right pleural space (series 7).  Approximately 50 cc blood  tinged pleural fluid was aspirated. The tube was connected to a pleural vac device and sutured in place. A dressing was placed.  The patient tolerated the procedure well without immediate post procedural complication.  IMPRESSION: Successful CT guided placement of a 4 French all purpose drain catheter into the caudal aspect of the right pleural space with aspiration of approximately 50 mL of blood tinged pleural fluid. Samples were sent to the laboratory as requested by the ordering clinical team.   Electronically Signed   By: Sandi Mariscal M.D.   On: 09/01/2014 16:50    Labs:  CBC:  Recent Labs  09/01/14 0517 09/02/14 0316 09/03/14 0453 09/04/14 0343  WBC 20.1* 16.5* 14.8* 16.9*  HGB 9.4* 8.7* 8.5* 9.0*  HCT 27.9* 26.6* 26.1* 27.9*  PLT 246 230 287 296    COAGS:  Recent Labs  04/23/14 1415  INR 1.17  APTT 30    BMP:  Recent Labs  09/01/14 0517 09/02/14 0316 09/03/14 0453 09/04/14 0343  NA 128* 132* 132* 134*  K 4.7 4.9 4.5 5.0  CL 100* 101 103 101  CO2 23 21* 24 26  GLUCOSE 113* 103* 107* 101*  BUN 30* 29* 28* 24*  CALCIUM 7.9* 7.9* 8.0* 8.4*  CREATININE 1.12 1.21 1.22 1.23  GFRNONAA 56* 51* 51* 50*  GFRAA >60 59* 59* 58*    LIVER FUNCTION TESTS:  Recent Labs  04/21/14 1541 08/10/14 0658 08/25/14 0700 08/26/14 0448  BILITOT 1.1 0.7 1.5* 1.4*  AST 24 44* 19 13*  ALT 18 58 18 14*  ALKPHOS 55 66 72 66  PROT 6.6 5.5* 6.3* 5.8*  ALBUMIN 4.5 3.0* 3.8 3.2*    Assessment and Plan:  Rt pleural effusion Rt chest tube drain in place No air leak Consider TPA? Plan per CCM  Signed: Ronna Herskowitz A 09/04/2014, 3:39 PM   I spent a total of 15 Minutes in face to face in clinical consultation/evaluation, greater than 50% of which was counseling/coordinating care for Rt chest tube drain

## 2014-09-04 NOTE — Progress Notes (Signed)
Patient ID: Johnathan Strong, male   DOB: 10-22-1923, 80 y.o.   MRN: [id number]   Request to remove Rt chest tube per Dr Alva Garnet Dr Annamaria Boots approves  Chest tube removed at bedside without complication cxr pending

## 2014-09-04 NOTE — Progress Notes (Signed)
Pt ambulated once around unit and tolerated well. Graceann Congress

## 2014-09-04 NOTE — Progress Notes (Signed)
[facility] PROGRESS NOTE  Johnathan Strong OJJ:[id number] DOB: 06-18-1923 DOA: 08/25/2014 PCP: Redge Gainer, MD  Interim Summary Patient is a pleasant 81 year old gentleman with a past medical history of CML, recently discharged from the medicine service on 08/19/2014, admitted on 08/09/2014 during which he was treated for community-acquired pneumonia. Patient showed gradual improvement as he was weaned off of supplemental oxygen, tolerating by mouth intake, and was discharged on Ceftin. His wife reporting that patient did well the week after discharge, got back to his normal routine, then had a decline this past Monday, developing fevers, chills, night sweats. Chest x-ray performed on admission revealed moderate right pleural effusion with associated right lung base consolidation/collapse. He was started on broad-spectrum IV antibiotic therapy with vancomycin and ceftazidime, undergoing ultrasound-guided thoracentesis by interventional radiology on 08/26/2014. Pulmonary critical care medicine was consulted regarding pleural effusion as it was felt he would benefit chest tube. PCCM placed a 32 French chest tube on 08/27/2014 (rather than wayne cath) since there was marked fibrinous tissue and concern for loculation. Subsequent imagining showed incomplete drainage for which TPA was injected into pleural space. This seemed to help initially however subsequent injections did not yield significant fluid. PCCM recommended proceeding with IR consult for PIG-tail catheter, which was performed on 09/01/2014. Hospitalization was complicated by the moment of A. fib with RVR on 08/27/2014 likely precipitated by underlying pulmonary infectious process and acute hypoxemic respiratory failure. Controlling Afib has been a challenge. Dr Coralyn Pear consulted cardiology on 09/01/2014.   Assessment/Plan: #1 healthcare associated pneumonia with probable parapneumonic effusion Patient recently discharged on 08/19/2014 from the  hospital after being treated for community-acquired pneumonia who presented with a probable parapneumonic effusion. Patient underwent ultrasound-guided thoracentesis by interventional radiology with a liter of yellow colored fluid removed with a cell count of 10,299. Thoracentesis was consistent with exudative characteristics. No organisms seen on Gram stain. Blood cultures pending. Afebrile. WBC trending down. Patient also had chest tube placed on 08/27/2014 with 1200 mL output in all 625 and 626 minimal output. On 09/01/2014 posterior 12 French chest tube was placed per IR. 09/02/2014 patient had right lateral chest tube removed per critical care. Patient is afebrile. WBC trending down. Treatment options were discussed with patient and family including drain catheter versus repeat thoracentesis. VATS procedure was felt not to be well tolerated due to his multiple comorbidities, advanced age, A. fib with RVR. IV Tressie Ellis has been changed to oral levaquin. Pulmonary medicine gave TPA injections into pleural space due to amount of fibrous material and incomplete fluid drainage from the chest tube. Patient did have a repeat chest CT that showed interval improvement in right-sided pleural effusion. Patient was evaluated by Dr. Roxan Hockey of cardiothoracic surgery who did not recommend a VATS procedure. Patient had pigtail catheter placement done on 09/01/2014 per interventional radiology. Clinical improvement. Continue current therapy. Pulmonary critical care following.  #2 acute hypoxemic respiratory failure Likely multifactorial secondary to problem #1. Improving slowly.  #3 A. fib with RVR CHADS2VASC score 6. Patient was noted to have heart rates in the 140s overnight.Felt to be secondary to patient's pulmonary process. Patient has been seen in consultation by cardiology were adjusting patient's rate controlling medications. Patient blood pressure with some improvement. Metoprolol dose has been increased to  50 mg twice daily per cardiology. Diltiazem has been decreased to 240 mg daily per cardiology as patient was noted to be dizzy and hypotensive 2 days ago. Patient was also given some digoxin. Per cardiology. Pulmonary to advise when anticoagulation  may be resumed.  #4 coronary artery disease status post DES to the LAD in 2014 Stable. Continue beta blocker, statin. Plavix on hold. Per cardiology.  #5 sepsis Noted on admission as patient had a white count of 24.2, respiratory rate of 33 heart rate of 107 with pulse of infection being healthcare associated pneumonia. Blood cultures pending with no growth to date. Pleural fluid cultures pending. Continue empiric antibiotics.   #6 hypertension Due to hypotension, Cardizem dose was decreased back to his home regimen. Metoprolol dose has been increased slightly to 50 mg twice a day. Cozaar on hold.   #7 acute kidney injury Improvement. Stable. Follow.  #8 CLL Outpatient follow-up.  #9 prophylaxis SCDs for DVT prophylaxis.  Code Status: Full Family Communication: Updated patient, daughter, wife at bedside. Disposition Plan: Remain inpatient  Consultants:  Cardiology: Dr. Haroldine Laws 09/01/2014  CT surgery Dr. Roxan Hockey 08/28/2014  Critical care medicine Dr. Alva Garnet 08/26/2014  Procedures:  Ultrasound-guided thoracentesis 08/26/2014  CT guided drainage catheter placement into caudal aspect of right pleural space per Dr. Pascal Lux 09/01/2014  Right chest tube placement 08/27/2014  CT chest 08/30/2014, 08/25/2014  Antibiotics:  IV Fortaz 08/25/2014>>>>>>09/03/14  IV vancomycin 08/25/2014>>>> 09/01/2014  Oral levaquin 09/03/14  HPI/Subjective: Patient states did not feel too well last night, with little sleep. Patient also c/o of upper left sided chest burning last night which has since resolved.  Objective: Filed Vitals:   09/04/14 1147  BP: 129/78  Pulse:   Temp: 98 F (36.7 C)  Resp:     Intake/Output Summary (Last 24  hours) at 09/04/14 1405 Last data filed at 09/04/14 1311  Gross per 24 hour  Intake    720 ml  Output    464 ml  Net    256 ml   Filed Weights   09/02/14 0400 09/03/14 0400 09/04/14 0456  Weight: 75.07 kg (165 lb 8 oz) 73.573 kg (162 lb 3.2 oz) 76.159 kg (167 lb 14.4 oz)    Exam:   General:  NAD  Cardiovascular: Irregularly irregular  Respiratory: Decreased breath sounds in the right base, otherwise clear. Right chest tube has been removed. Pigtail catheter in place.  Abdomen: Soft, nontender, nondistended, positive bowel sounds.  Musculoskeletal: No clubbing cyanosis or edema.  Data Reviewed: Basic Metabolic Panel:  Recent Labs Lab 08/31/14 0320 09/01/14 0517 09/02/14 0316 09/03/14 0453 09/04/14 0343  NA 129* 128* 132* 132* 134*  K 5.0 4.7 4.9 4.5 5.0  CL 100* 100* 101 103 101  CO2 21* 23 21* 24 26  GLUCOSE 110* 113* 103* 107* 101*  BUN 29* 30* 29* 28* 24*  CREATININE 1.24 1.12 1.21 1.22 1.23  CALCIUM 7.9* 7.9* 7.9* 8.0* 8.4*  MG  --   --   --  2.0  --    Liver Function Tests: No results for input(s): AST, ALT, ALKPHOS, BILITOT, PROT, ALBUMIN in the last 168 hours. No results for input(s): LIPASE, AMYLASE in the last 168 hours. No results for input(s): AMMONIA in the last 168 hours. CBC:  Recent Labs Lab 08/31/14 0320 09/01/14 0517 09/02/14 0316 09/03/14 0453 09/04/14 0343  WBC 21.0* 20.1* 16.5* 14.8* 16.9*  HGB 10.1* 9.4* 8.7* 8.5* 9.0*  HCT 30.2* 27.9* 26.6* 26.1* 27.9*  MCV 88.6 87.2 89.9 88.8 88.9  PLT 231 246 230 287 296   Cardiac Enzymes: No results for input(s): CKTOTAL, CKMB, CKMBINDEX, TROPONINI in the last 168 hours. BNP (last 3 results)  Recent Labs  04/21/14 1541 08/25/14 0700  BNP 484.0* 224.5*  ProBNP (last 3 results) No results for input(s): PROBNP in the last 8760 hours.  CBG: No results for input(s): GLUCAP in the last 168 hours.  Recent Results (from the past 240 hour(s))  Culture, body fluid-bottle     Status:  None   Collection Time: 08/26/14 11:09 AM  Result Value Ref Range Status   Specimen Description FLUID RIGHT PLEURAL  Final   Special Requests NONE  Final   Culture NO GROWTH 5 DAYS  Final   Report Status 08/31/2014 FINAL  Final  Gram stain     Status: None   Collection Time: 08/26/14 11:09 AM  Result Value Ref Range Status   Specimen Description FLUID RIGHT PLEURAL  Final   Special Requests NONE  Final   Gram Stain   Final    ABUNDANT WBC PRESENT, PREDOMINANTLY PMN NO ORGANISMS SEEN    Report Status 08/26/2014 FINAL  Final  Culture, body fluid-bottle     Status: None (Preliminary result)   Collection Time: 09/01/14  3:45 PM  Result Value Ref Range Status   Specimen Description PLEURAL FLUID  Final   Special Requests NONE  Final   Culture NO GROWTH 2 DAYS  Final   Report Status PENDING  Incomplete  Gram stain     Status: None   Collection Time: 09/01/14  3:45 PM  Result Value Ref Range Status   Specimen Description PLEURAL FLUID  Final   Special Requests NONE  Final   Gram Stain   Final    ABUNDANT WBC PRESENT,BOTH PMN AND MONONUCLEAR NO ORGANISMS SEEN    Report Status 09/02/2014 FINAL  Final     Studies: Dg Chest Port 1 View  09/04/2014   CLINICAL DATA:  Followup respiratory failure.  EXAM: PORTABLE CHEST - 1 VIEW  COMPARISON:  09/03/2014  FINDINGS: Persistent right lung base opacity is noted consistent with a combination of pleural fluid and atelectasis. There is minor persistent subsegmental atelectasis at the left lung base. No pulmonary edema. No pneumothorax.  Cardiopericardial silhouette is normal in size. No mediastinal or hilar masses.  IMPRESSION: 1. No change from the previous day's study. 2. Persistent right basilar opacity most likely combination of atelectasis and pleural fluid. 3. No pneumothorax.   Electronically Signed   By: Lajean Manes M.D.   On: 09/04/2014 08:13   Dg Chest Port 1 View  09/03/2014   CLINICAL DATA:  Respiratory failure, right-sided chest tube   EXAM: PORTABLE CHEST - 1 VIEW  COMPARISON:  Portable chest x-ray of September 02, 2014  FINDINGS: The large caliber right chest tube has been removed. The pigtail catheter remains in place with the pigtail located in the medial costophrenic gutter. There is stable increased density at the right lung base consistent with pleural fluid and/or pleural thickening. There is no pneumothorax. The left lung is clear. The heart and mediastinal structures are normal. The observed bony thorax is unremarkable.  IMPRESSION: Interval removal of the large caliber chest tube with stable positioning of the small caliber tube. There is stable increased density at the right lung base consistent with pleural fluid or thickening and likely atelectasis.   Electronically Signed   By: David  Martinique M.D.   On: 09/03/2014 07:27    Scheduled Meds: . atorvastatin  10 mg Oral q1800  . calcium carbonate  1 tablet Oral BID  . digoxin  0.125 mg Oral Daily  . diltiazem  240 mg Oral Daily  . feeding supplement (ENSURE ENLIVE)  237 mL Oral  TID BM  . [START ON 09/05/2014] levofloxacin  500 mg Oral Q48H  . metoprolol tartrate  50 mg Oral BID  . montelukast  10 mg Oral QHS  . polyethylene glycol  17 g Oral Daily  . senna-docusate  1 tablet Oral BID   Continuous Infusions: . sodium chloride 10 mL/hr at 09/01/14 1204    Principal Problem:   HCAP (healthcare-associated pneumonia) Active Problems:   Pleural effusion associated with pulmonary infection   CAD (coronary artery disease)   Atrial fibrillation   Chronic lymphatic leukemia   Pleural effusion on right   Sepsis   Pleural effusion   Pleural effusion, right   Blood poisoning   Chest tube in place   SOB (shortness of breath)    Time spent: 35 minutes    [facility] MD [facility] Pager [phone]. If 7PM-7AM, please contact night-coverage at [email or web address], password [facility] 09/04/2014, 2:05 PM  LOS: 10 days

## 2014-09-04 NOTE — Progress Notes (Signed)
Patient heart rate sustained in the 130's after prn Metoprolol. Patient asymptomatic. Other vital signs stable. Friedman notified and aware. Will continue to monitor.

## 2014-09-05 ENCOUNTER — Inpatient Hospital Stay (HOSPITAL_COMMUNITY): Payer: Medicare Other

## 2014-09-05 DIAGNOSIS — C9110 Chronic lymphocytic leukemia of B-cell type not having achieved remission: Secondary | ICD-10-CM

## 2014-09-05 DIAGNOSIS — I4891 Unspecified atrial fibrillation: Secondary | ICD-10-CM

## 2014-09-05 DIAGNOSIS — A408 Other streptococcal sepsis: Secondary | ICD-10-CM

## 2014-09-05 LAB — CBC
HCT: 26 % — ABNORMAL LOW (ref 39.0–52.0)
HEMOGLOBIN: 8.4 g/dL — AB (ref 13.0–17.0)
MCH: 28.7 pg (ref 26.0–34.0)
MCHC: 32.3 g/dL (ref 30.0–36.0)
MCV: 88.7 fL (ref 78.0–100.0)
Platelets: 325 10*3/uL (ref 150–400)
RBC: 2.93 MIL/uL — AB (ref 4.22–5.81)
RDW: 23.3 % — ABNORMAL HIGH (ref 11.5–15.5)
WBC: 17.4 10*3/uL — ABNORMAL HIGH (ref 4.0–10.5)

## 2014-09-05 LAB — BASIC METABOLIC PANEL
ANION GAP: 9 (ref 5–15)
BUN: 25 mg/dL — ABNORMAL HIGH (ref 6–20)
CALCIUM: 8 mg/dL — AB (ref 8.9–10.3)
CO2: 25 mmol/L (ref 22–32)
Chloride: 98 mmol/L — ABNORMAL LOW (ref 101–111)
Creatinine, Ser: 1.22 mg/dL (ref 0.61–1.24)
GFR calc Af Amer: 59 mL/min — ABNORMAL LOW (ref >60)
GFR calc non Af Amer: 51 mL/min — ABNORMAL LOW (ref >60)
Glucose, Bld: 105 mg/dL — ABNORMAL HIGH (ref 65–99)
Potassium: 5.1 mmol/L (ref 3.5–5.1)
Sodium: 132 mmol/L — ABNORMAL LOW (ref 135–145)

## 2014-09-05 NOTE — Progress Notes (Signed)
SUBJECTIVE: Thinks breathing may be slightly improved, but "not as efficient as it used to be". Denies chest pain, leg swelling.     Intake/Output Summary (Last 24 hours) at 09/05/14 0855 Last data filed at 09/05/14 0841  Gross per 24 hour  Intake    720 ml  Output    730 ml  Net    -10 ml    Current Facility-Administered Medications  Medication Dose Route Frequency Provider Last Rate Last Dose  . 0.9 %  sodium chloride infusion   Intravenous Continuous Raylene Miyamoto, MD 10 mL/hr at 09/01/14 1204    . acetaminophen (TYLENOL) tablet 650 mg  650 mg Oral Q6H PRN Domenic Polite, MD   650 mg at 09/03/14 1750  . albuterol (PROVENTIL) (2.5 MG/3ML) 0.083% nebulizer solution 2.5 mg  2.5 mg Inhalation Q6H PRN Domenic Polite, MD      . atorvastatin (LIPITOR) tablet 10 mg  10 mg Oral q1800 Domenic Polite, MD   10 mg at 09/04/14 1642  . calcium carbonate (TUMS - dosed in mg elemental calcium) chewable tablet 200 mg of elemental calcium  1 tablet Oral BID Kelvin Cellar, MD   200 mg of elemental calcium at 09/05/14 0850  . digoxin (LANOXIN) tablet 0.125 mg  0.125 mg Oral Daily Almyra Deforest, PA   0.125 mg at 09/05/14 0851  . diltiazem (CARDIZEM CD) 24 hr capsule 240 mg  240 mg Oral Daily Almyra Deforest, [place]   240 mg at 09/05/14 0852  . diphenhydrAMINE (BENADRYL) capsule 25 mg  25 mg Oral QHS PRN Domenic Polite, MD   25 mg at 09/02/14 2251  . feeding supplement (ENSURE ENLIVE) (ENSURE ENLIVE) liquid 237 mL  237 mL Oral TID BM Kelvin Cellar, MD   237 mL at 09/04/14 2059  . fentaNYL (SUBLIMAZE) injection 25 mcg  25 mcg Intravenous Q2H PRN Erick Colace, NP      . lactulose (CHRONULAC) 10 GM/15ML solution 20 g  20 g Oral Daily PRN Kelvin Cellar, MD   20 g at 08/30/14 1022  . levofloxacin (LEVAQUIN) tablet 500 mg  500 mg Oral Q48H Lyndee Leo, RPH   500 mg at 09/05/14 0851  . metoprolol (LOPRESSOR) injection 2.5 mg  2.5 mg Intravenous Q6H PRN Gardiner Barefoot, NP   2.5 mg at 09/04/14 0335    . metoprolol (LOPRESSOR) tablet 50 mg  50 mg Oral BID Almyra Deforest, PA   50 mg at 09/05/14 0853  . montelukast (SINGULAIR) tablet 10 mg  10 mg Oral QHS Domenic Polite, MD   10 mg at 09/04/14 2058  . nitroGLYCERIN (NITROSTAT) SL tablet 0.4 mg  0.4 mg Sublingual Q5 min PRN Domenic Polite, MD      . ondansetron [facility]) tablet 4 mg  4 mg Oral Q6H PRN Domenic Polite, MD       Or  . ondansetron [facility]) injection 4 mg  4 mg Intravenous Q6H PRN Domenic Polite, MD      . oxyCODONE-acetaminophen (PERCOCET/ROXICET) 5-325 MG per tablet 1 tablet  1 tablet Oral Q4H PRN Wilhelmina Mcardle, MD   1 tablet at 09/01/14 1955  . polyethylene glycol (MIRALAX / GLYCOLAX) packet 17 g  17 g Oral Daily Kelvin Cellar, MD   17 g at 09/05/14 0854  . senna-docusate (Senokot-S) tablet 1 tablet  1 tablet Oral BID Kelvin Cellar, MD   1 tablet at 09/05/14 [phone]  . traMADol (ULTRAM) tablet 50 mg  50 mg Oral  Q12H PRN Domenic Polite, MD   50 mg at 09/03/14 2343    Filed Vitals:   09/05/14 0429 09/05/14 0743 09/05/14 0851 09/05/14 0852  BP: 138/69 146/93  164/98  Pulse: 74 123 142   Temp: 98.3 F (36.8 C) 97.9 F (36.6 C)    TempSrc: Oral Axillary    Resp: 18 18    Height:      Weight: 170 lb (77.111 kg)     SpO2: 100% 96%      PHYSICAL EXAM General: NAD HEENT: Normal. Neck: No JVD, no thyromegaly.  Lungs: Diminished at bases, R>L CV: Tachycardic, irregular rhythm, normal S1/S2, no S3, no murmur.  No pretibial edema.   Abdomen: Soft, nontender, no hepatosplenomegaly, no distention.  Neurologic: Alert and oriented.  Psych: Normal affect. Musculoskeletal: Normal range of motion. No gross deformities. Extremities: No clubbing or cyanosis.   TELEMETRY: Reviewed telemetry pt in rapid atrial fibrillation/flutter  LABS: Basic Metabolic Panel:  Recent Labs  09/03/14 0453 09/04/14 0343 09/05/14 0458  NA 132* 134* 132*  K 4.5 5.0 5.1  CL 103 101 98*  CO2 24 26 25   GLUCOSE 107* 101* 105*  BUN 28* 24* 25*   CREATININE 1.22 1.23 1.22  CALCIUM 8.0* 8.4* 8.0*  MG 2.0  --   --    Liver Function Tests: No results for input(s): AST, ALT, ALKPHOS, BILITOT, PROT, ALBUMIN in the last 72 hours. No results for input(s): LIPASE, AMYLASE in the last 72 hours. CBC:  Recent Labs  09/04/14 0343 09/05/14 0458  WBC 16.9* 17.4*  HGB 9.0* 8.4*  HCT 27.9* 26.0*  MCV 88.9 88.7  PLT 296 325   Cardiac Enzymes: No results for input(s): CKTOTAL, CKMB, CKMBINDEX, TROPONINI in the last 72 hours. BNP: Invalid input(s): POCBNP D-Dimer: No results for input(s): DDIMER in the last 72 hours. Hemoglobin A1C: No results for input(s): HGBA1C in the last 72 hours. Fasting Lipid Panel: No results for input(s): CHOL, HDL, LDLCALC, TRIG, CHOLHDL, LDLDIRECT in the last 72 hours. Thyroid Function Tests: No results for input(s): TSH, T4TOTAL, T3FREE, THYROIDAB in the last 72 hours.  Invalid input(s): FREET3 Anemia Panel: No results for input(s): VITAMINB12, FOLATE, FERRITIN, TIBC, IRON, RETICCTPCT in the last 72 hours.  RADIOLOGY: Dg Chest 1 View  08/26/2014   CLINICAL DATA:  Post right thoracentesis, pleural effusion.  EXAM: CHEST  1 VIEW  COMPARISON:  08/25/2014  FINDINGS: Slight decreased size of the right effusion which remains moderate. No visible pneumothorax. Continued right lower lobe atelectasis or infiltrate. Left lung is clear. Heart is normal size. No acute bony abnormality.  IMPRESSION: No pneumothorax following thoracentesis. Continued moderate right effusion with right lower lobe atelectasis or infiltrate.   Electronically Signed   By: Rolm Baptise M.D.   On: 08/26/2014 11:42   Dg Chest 2 View  08/31/2014   CLINICAL DATA:  Right pleural effusion with chest tube drainage, shortness of breath.  EXAM: CHEST  2 VIEW  COMPARISON:  Chest x-ray of June Jul 29, 2014 and chest CT scan of August 30, 2014.  FINDINGS: On the right there is mild volume loss and a moderate-sized pleural effusion. The right chest tube  tip projects over the posterior aspect of the seventh rib and is unchanged. There is atelectasis or pneumonia at the right lung base which is stable. The left lung is well-expanded. There is a stable approximately 11 mm calcified nodule in the left lower lobe. There is no mediastinal shift. The cardiac silhouette is mildly  enlarged. The pulmonary vascularity is normal. The bony thorax exhibits no acute abnormality.  IMPRESSION: Persistent moderate-sized inferior right pleural effusion. The tip of the right chest tube lies along the superior aspect of the fluid collection similar to the positioning described on the CT scan.  Mild stable enlargement of the cardiac silhouette without pulmonary edema.  Evidence of previous granulomatous infection.   Electronically Signed   By: David  Martinique M.D.   On: 08/31/2014 09:19   Dg Chest 2 View  08/25/2014   CLINICAL DATA:  81 year old male with shortness of breath and chest pain since last night. Pain radiating to the right shoulder with chills. Initial encounter. Recent diagnosis of pneumonia.  EXAM: CHEST  2 VIEW  COMPARISON:  08/13/2014 and earlier.  FINDINGS: Seated upright AP and lateral views of the chest. Continued moderate size right pleural effusion with dense right lung base opacification. Lower lung volumes. No pneumothorax or pulmonary edema. Stable cardiac size and mediastinal contours. The left lung remains clear aside from evidence of a chronic lower lobe pulmonary nodule which has not significantly changed since 2013. Calcified atherosclerosis of the aorta again noted. No acute osseous abnormality identified.  IMPRESSION: 1. Continued moderate right pleural effusion with associated right lung base collapse/consolidation. 2. No new cardiopulmonary abnormality. Chronic left lower lobe lung nodule.   Electronically Signed   By: Genevie Ann M.D.   On: 08/25/2014 08:17   Dg Chest 2 View  08/13/2014   CLINICAL DATA:  Shortness of breath weakness pneumonia  EXAM:  CHEST  2 VIEW  COMPARISON:  08/11/2014  FINDINGS: Moderate right effusion with underlying consolidation stable. Heart size and vascular pattern normal. No evidence of infiltrate on the left. Dense circumscribed oval 1 cm nodule to the left of the descending thoracic aorta stable from 02/26/2012 and therefore considered to be benign. This projects over the spine on the lateral radiograph and it is likely a granuloma given its density and stability.  IMPRESSION: No change from prior study. Moderate right effusion and lower lobe consolidation.   Electronically Signed   By: Skipper Cliche M.D.   On: 08/13/2014 14:08   Dg Chest 2 View  08/11/2014   CLINICAL DATA:  Chest pain  EXAM: CHEST  2 VIEW  COMPARISON:  08/10/2014  FINDINGS: Heart is normal size. Consolidation in the right lower lobe concerning for pneumonia. Moderate right pleural effusion. Left lung is clear. No acute bony abnormality.  IMPRESSION: Continued right lower lobe consolidation and moderate right effusion, stable or slightly worsened since prior study.   Electronically Signed   By: Rolm Baptise M.D.   On: 08/11/2014 15:26   Dg Chest 2 View  08/10/2014   CLINICAL DATA:  Pneumonia, shortness of breath.  EXAM: CHEST  2 VIEW  COMPARISON:  PA and lateral chest x-ray of August 09, 2014  FINDINGS: The left lung is well-expanded. On the right there is persistent pleural fluid and increased basilar parenchymal density. The heart and pulmonary vascularity are normal. The mediastinum is normal in width. There is calcification in the wall of the thoracic aorta. There is mild gaseous distention of bowel under the left hemidiaphragm. The bony thorax exhibits no acute abnormality.  IMPRESSION: Persistent right lower lobe atelectasis or pneumonia and small right pleural effusion. There is likely underlying COPD. There has been interval development of minimal left basilar subsegmental atelectasis.   Electronically Signed   By: David  Martinique M.D.   On: 08/10/2014  07:53   Ct Chest Wo  Contrast  08/30/2014   CLINICAL DATA:  Right pleural effusion and right-sided chest tube. Patient receiving intrapleural tPA.  EXAM: CT CHEST WITHOUT CONTRAST  TECHNIQUE: Multidetector CT imaging of the chest was performed following the standard protocol without IV contrast.  COMPARISON:  CT 08/25/2014 and chest x-ray 08/29/2014  FINDINGS: Right anterior lateral chest tube courses into the posterior pleural space at the level of the mid lungs as tip is within the most superior aspect of the pleural fluid collection. Minimal subcutaneous emphysema over the right flank. There is a moderate size right pleural effusion with mild interval improvement. There is continued compressive atelectasis over the right lower lobe and right middle lobes. Small collection of air within the pleural space over the anterior aspect of the pleural effusion near the entry site at the chest tube. Calcified granuloma over the left lower lobe. Minimal scarring over the posterior left lower lobe. Airways are within normal.  Mild stable cardiomegaly. Calcification of the mitral valve annulus. Moderate calcification of the left main and 3 vessel coronary arteries unchanged. Coronary stents present. Tiny amount of pericardial fluid present. Mild-to-moderate calcified plaque over the thoracic aorta. No mediastinal, hilar or axillary adenopathy. Stable sub cm lymph node over the right retrocrural region at the level of the diaphragm.  Images through the upper abdomen are unchanged. There are degenerative changes of the spine.  IMPRESSION: Interval improvement in a moderate size right pleural fluid collection with associated compressive atelectasis over the right middle and lower lobes. Right-sided chest tube in with tip over the posterior pleural space in the most superior aspect of the pleural fluid collection.  Prior granulomas disease.  Atherosclerotic coronary artery disease with coronary stents present. Mild  calcification of the mitral valve annulus. Mild cardiomegaly.   Electronically Signed   By: Marin Olp M.D.   On: 08/30/2014 09:31   Ct Chest W Contrast  08/25/2014   CLINICAL DATA:  Pleurisy and increase shortness of breath  EXAM: CT CHEST WITH CONTRAST  TECHNIQUE: Multidetector CT imaging of the chest was performed during intravenous contrast administration.  CONTRAST:  56mL OMNIPAQUE IOHEXOL 300 MG/ML  SOLN  COMPARISON:  None.  FINDINGS: THORACIC INLET/BODY WALL:  No acute abnormality.  MEDIASTINUM:  Normal heart size. No pericardial effusion. Extensive coronary atherosclerosis. No acute vascular abnormality. No adenopathy.  LUNG WINDOWS:  Large right pleural effusion which is water density. The fluid is predominantly free-flowing based on comparison to previous chest x-ray, but there is some complex features with scalloping of the lung margins. The right lower lobe and lateral segment right middle lobe are collapsed. There is no evidence of pleural nodularity or lung mass. No pneumonia is suspected.  Calcified granuloma in the left lower lobe.  UPPER ABDOMEN:  Partly visualized left renal cyst, imaged portions stable from 09/29/2008 abdominal CT  OSSEOUS:  No acute fracture.  No suspicious lytic or blastic lesions.  IMPRESSION: Large right pleural effusion with lower lobe and middle lobe segmental collapse. The effusion is predominantly free-flowing based on prior chest x-ray, but there may be partial loculation where the lung is scalloped . No pleural or pulmonary cause is identified.   Electronically Signed   By: Monte Fantasia M.D.   On: 08/25/2014 15:09   Dg Chest Port 1 View  09/04/2014   CLINICAL DATA:  Status post chest tube removal. Right pleural effusion.  EXAM: PORTABLE CHEST - 1 VIEW  COMPARISON:  09/04/2014 at 7:04 a.m.  FINDINGS: No pneumothorax.  Right lung base  opacity consistent with a small residual effusion and associated atelectasis, unchanged from the earlier study. No pulmonary  edema. No new lung abnormalities.  Cardiac silhouette normal in size. No mediastinal or hilar masses or evidence of adenopathy.  IMPRESSION: 1. No change from the earlier study. Persistent right lung base opacity most likely combination of atelectasis and a small pleural effusion. 2. No pneumothorax.   Electronically Signed   By: Lajean Manes M.D.   On: 09/04/2014 16:35   Dg Chest Port 1 View  09/04/2014   CLINICAL DATA:  Followup respiratory failure.  EXAM: PORTABLE CHEST - 1 VIEW  COMPARISON:  09/03/2014  FINDINGS: Persistent right lung base opacity is noted consistent with a combination of pleural fluid and atelectasis. There is minor persistent subsegmental atelectasis at the left lung base. No pulmonary edema. No pneumothorax.  Cardiopericardial silhouette is normal in size. No mediastinal or hilar masses.  IMPRESSION: 1. No change from the previous day's study. 2. Persistent right basilar opacity most likely combination of atelectasis and pleural fluid. 3. No pneumothorax.   Electronically Signed   By: Lajean Manes M.D.   On: 09/04/2014 08:13   Dg Chest Port 1 View  09/03/2014   CLINICAL DATA:  Respiratory failure, right-sided chest tube  EXAM: PORTABLE CHEST - 1 VIEW  COMPARISON:  Portable chest x-ray of September 02, 2014  FINDINGS: The large caliber right chest tube has been removed. The pigtail catheter remains in place with the pigtail located in the medial costophrenic gutter. There is stable increased density at the right lung base consistent with pleural fluid and/or pleural thickening. There is no pneumothorax. The left lung is clear. The heart and mediastinal structures are normal. The observed bony thorax is unremarkable.  IMPRESSION: Interval removal of the large caliber chest tube with stable positioning of the small caliber tube. There is stable increased density at the right lung base consistent with pleural fluid or thickening and likely atelectasis.   Electronically Signed   By: David   Martinique M.D.   On: 09/03/2014 07:27   Dg Chest Port 1 View  09/02/2014   CLINICAL DATA:  Chest tube treatment of right pleural effusion  EXAM: PORTABLE CHEST - 1 VIEW  COMPARISON:  Chest x-ray of August 31, 2014  FINDINGS: The large caliber chest tube tip on the right projects over the posterior aspect of the seventh rib. The new small caliber tube has its pigtail overlying the medial costovertebral angle on the right. There is persistent right lower lobe atelectasis. The left lung is clear. There is no mediastinal shift. There is no pneumothorax. A small amount of pleural fluid likely remains on the right. The heart is normal in size. The pulmonary vascularity is mildly prominent centrally. The bony thorax exhibits no acute abnormality.  IMPRESSION: Stable appearance of the right hemithorax with the presence of 2 chest tubes. There remains parenchymal consolidation as well as small amount of pleural fluid on the right.   Electronically Signed   By: David  Martinique M.D.   On: 09/02/2014 07:41   Dg Chest Port 1 View  08/29/2014   CLINICAL DATA:  Evaluate pleural effusions  EXAM: PORTABLE CHEST - 1 VIEW  COMPARISON:  08/28/2014  FINDINGS: Right chest tube in place. No pneumothorax identified. The right-sided pleural effusion is unchanged in volume from previous exam. There is atelectasis in the right base. Gas is noted within the soft tissues of the right chest wall surrounding the chest tube.  IMPRESSION: 1. No change  in right pleural effusion.   Electronically Signed   By: Kerby Moors M.D.   On: 08/29/2014 13:12   Dg Chest Port 1 View  08/28/2014   CLINICAL DATA:  Chest tube placement for effusion.  EXAM: PORTABLE CHEST - 1 VIEW  COMPARISON:  Study obtained earlier in the day  FINDINGS: There is a chest tube on the right without demonstrable pneumothorax. Right effusion is smaller. There is a fairly small right effusion with right base consolidation and patchy atelectasis. Lungs elsewhere clear. There is no  appreciable interstitial edema. The heart size and pulmonary vascularity are normal. There is atherosclerotic change in the aorta. No adenopathy. No bone lesions.  IMPRESSION: Chest tube in place on the right. No pneumothorax. Note that there is soft tissue air tracking along the chest tube in the lateral right hemithorax inferiorly. Right pleural effusion is smaller compared to earlier in the day. There is a residual right effusion with right base consolidation and patchy atelectasis.  There is no evidence of pulmonary edema. No change in cardiac silhouette.   Electronically Signed   By: Lowella Grip III M.D.   On: 08/28/2014 15:19   Dg Chest Port 1 View  08/28/2014   CLINICAL DATA:  Pleural effusion.  Pulmonary infection.  EXAM: PORTABLE CHEST - 1 VIEW  COMPARISON:  08/27/2014.  FINDINGS: Right chest tube in stable position. Mediastinum and hilar structures are normal. Cardiomegaly with normal pulmonary vascularity. Right lower lobe infiltrate consistent with pneumonia. Right pleural effusion. Right chest wall subcutaneous emphysema again noted.  IMPRESSION: 1.  Right chest tube in stable position.  No pneumothorax.  2. Persistent dense right lower lobe infiltrate and right pleural effusion. No interim change.   Electronically Signed   By: Marcello Moores  Register   On: 08/28/2014 08:15   Dg Chest Port 1 View  08/27/2014   CLINICAL DATA:  Chest tube placement.  EXAM: PORTABLE CHEST - 1 VIEW  COMPARISON:  Single view of the chest 08/26/2014.  FINDINGS: A new right chest tube is in place. Right pleural effusion and basilar airspace disease are again seen. No pneumothorax is identified. The left lung appears clear. Heart size is normal.  IMPRESSION: Status post right chest tube placement. Right pleural effusion basilar airspace disease do not appear notably changed. No pneumothorax is seen.   Electronically Signed   By: Inge Rise M.D.   On: 08/27/2014 18:30   Dg Swallowing Func-speech  Pathology  09/01/2014    Objective Swallowing Evaluation:    Patient Details  Name: Johnathan Strong MRN: [id number] Date of Birth: 12/22/1923  Today's Date: 09/01/2014 Time: SLP Start Time (ACUTE ONLY): 1245-SLP Stop Time (ACUTE ONLY): 1320 SLP Time Calculation (min) (ACUTE ONLY): 35 min  Past Medical History:  Past Medical History  Diagnosis Date  . Osteopenia 2006  . Hyperlipidemia   . BPH (benign prostatic hypertrophy)   . Transient global amnesia   . Osteoarthritis   . History of TIA (transient ischemic attack) 1980'S    NO RESIDUAL  . COPD (chronic obstructive pulmonary disease) with emphysema   . Chronic back pain   . Itching SECONDARY TO CLL-- CONTROLLED W/ SINGULAIR  . Frequency   . Nocturia   . Hypertension CARDIOLOGIST- DR BRACKBILL-- LAST VISIT 12-15-2010 NOTE IN  EPIC  . CLL (chronic lymphocytic leukemia) LAST PLT COUNT APRL 2012  168    Managed at [facility]  . Bladder cancer   . Aspirin allergy  Eyes swell  . Asthma   . Dyspnea on exertion   . CAD (coronary artery disease)     a. 08/2012 Cath: LM nl, LAD 90p, LCX 20-30, RCA 100ost fills via L->R  collats, EF 55-65%;  b. 09/2012 PCI of prox LAD with 3.0x16 Promus DES.   Past Surgical History:  Past Surgical History  Procedure Laterality Date  . Excisional hemorrhoidectomy  1980  . Cardiovascular stress test  07/2005    a. 07/2005- no reversible ischemia, normal EF b. 06/2009- EF 73%, no  reversible ischemia, inferolateral TWIs at rest, upright in recovery c.  08/2012- mediuim-sized partially reversible basal to mid inferior and  inferoseptal perfusion defect c/w with prior infarction and peri-infarct  ischemia; EF 68% and borderline ST/T changes on stress ECG   . Transurethral resection of bladder tumor  10-19-2008    AND DILATION URETHRAL STRICTURE  . Cataract extraction w/ intraocular lens  implant, bilateral Bilateral ~  2007  . Transthoracic echocardiogram  01-03-2011    LVEF 06-30%, grade 1 diastolic dysfunction, no WMAs or structural   abnormalities  . Cystoscopy with biopsy  10/30/2011    Procedure: CYSTOSCOPY WITH BIOPSY;  Surgeon: Claybon Jabs, MD;   Location: [facility];  Service: Urology;  Laterality: N/A;   . Cardiac catheterization  08/27/2012  . Coronary angioplasty with stent placement  09/26/2012    "1" (09/26/2012)  . Coronary stent placement  09/26/12  . Left heart catheterization with coronary angiogram N/A 08/27/2012    Procedure: LEFT HEART CATHETERIZATION WITH CORONARY ANGIOGRAM;  Surgeon:  Peter M Martinique, MD;  Location: [facility] CATH LAB;  Service: Cardiovascular;   Laterality: N/A;  . Percutaneous coronary stent intervention (pci-s) N/A 09/26/2012    Procedure: PERCUTANEOUS CORONARY STENT INTERVENTION (PCI-S);  Surgeon:  Peter M Martinique, MD;  Location: [facility] CATH LAB;  Service: Cardiovascular;   Laterality: N/A;   HPI:  Other Pertinent Information: 81 y.o. male with past history of COPD,  paroxysmal A. fib on Eliquis, CAD, hypertension was recently discharged  from [facility] 6/9 after treatment for community-acquired  pneumonia. He presented to the ER where he was noted to have a white count  of 24K, chest x-ray with worsening pneumonia and moderate right pleural  effusion. Pt with chest tube.Previous admission for RLL PNA  with bedside  swallow completed 6/9: notes WFL oropharyngeal swallow with suspected  esophageal component.   No Data Recorded  Assessment / Plan / Recommendation CHL IP CLINICAL IMPRESSIONS 09/01/2014  Therapy Diagnosis Mild oral phase dysphagia;Suspected primary esophageal  dysphagia  Clinical Impression Pt demonstrates very mild oral dysphagia warranting  basic precautions to reduce risk of aspiration. The pt has trace premature  spillage and trace oral residuals after the swallow, both of which spill  to the valleculae and pyriforms prior to the swallow with 1-2 instances of  trace penetration, expelled with a swallow. Oropharyngeal function is  otherwise WNL. An esophageal sweep does reveal  stasis throughout the  distal to mid esophagus with upward movement of residual. The barium  tablet remained at the GE junction at the end of the test. Pt is  recommended to follow precautions to reduce risk of aspiration including  upright positioning, a second swallow and staying upright 30-60 minutes  after meal. Despite the precautions the pts highest risk is aspiration of  esophageal stasis after meals. Suggest f/u esophageal testing. SLP will  f/u for reinforcement of precautions.       CHL IP  TREATMENT RECOMMENDATION 09/01/2014  Treatment Recommendations Therapy as outlined in treatment plan below     CHL IP DIET RECOMMENDATION 09/01/2014  SLP Diet Recommendations Age appropriate regular solids;Thin  Liquid Administration via (None)  Medication Administration Whole meds with liquid  Compensations Small sips/bites  Postural Changes and/or Swallow Maneuvers (None)     CHL IP OTHER RECOMMENDATIONS 09/01/2014  Recommended Consults Consider esophageal assessment;Consider GI evaluation   Oral Care Recommendations Oral care BID  Other Recommendations (None)     No flowsheet data found.   CHL IP FREQUENCY AND DURATION 09/01/2014  Speech Therapy Frequency (ACUTE ONLY) min 2x/week  Treatment Duration 1 week       No flowsheet data found.  No flowsheet data found.        Herbie [place], [place] CCC-SLP [phone]  DeBlois, Katherene Ponto 09/01/2014, 2:57 PM    Ct Image Guided Drainage By Percutaneous Catheter  09/01/2014   INDICATION: History of community acquired pneumonia, post right-sided chest tube placement with residual loculated fluid seen within the caudal aspect of the right pleural space. Request made for placement of additional CT-guided percutaneous chest tube for infection source control purposes.  EXAM: CT IMAGE GUIDED DRAINAGE BY PERCUTANEOUS CATHETER  COMPARISON:  Chest CT- 08/10/2014; 08/25/2014; chest radiograph - 08/31/2014; ultrasound-guided right-sided thoracentesis - 08/26/2014  MEDICATIONS: The patient is  currently admitted to the hospital and receiving intravenous antibiotics. The antibiotics were administered within an appropriate time frame prior to the initiation of the procedure.  ANESTHESIA/SEDATION: Fentanyl 25 mcg IV; Versed 5 mg IV  Total Moderate Sedation time  20 minutes  CONTRAST:  None  COMPLICATIONS: None immediate  PROCEDURE: Informed written consent was obtained from the patient after a discussion of the risks, benefits and alternatives to treatment. The patient was placed right lateral decubitus on the CT gantry and a pre procedural CT was performed re-demonstrating the known loculated fluid collection within the right pleural space. The procedure was planned. A timeout was performed prior to the initiation of the procedure.  The right inferior posterior lateral chest wall was prepped and draped in the usual sterile fashion. The overlying soft tissues were anesthetized with 1% lidocaine with epinephrine. Appropriate trajectory was planned with the use of a 22 gauge spinal needle. An 18 gauge trocar needle was advanced into the right pleural space and a short Amplatz super stiff wire was coiled within the collection. Appropriate positioning was confirmed with a limited CT scan. The tract was serially dilated allowing placement of a 12 [place] all-purpose drainage catheter. Appropriate positioning was confirmed with a limited postprocedural CT scan demonstrating the and of the percutaneous drainage catheter coiled and locked within the posterior medial aspect of the right pleural space (series 7).  Approximately 50 cc blood tinged pleural fluid was aspirated. The tube was connected to a pleural vac device and sutured in place. A dressing was placed. The patient tolerated the procedure well without immediate post procedural complication.  IMPRESSION: Successful CT guided placement of a 22 French all purpose drain catheter into the caudal aspect of the right pleural space with aspiration of approximately  50 mL of blood tinged pleural fluid. Samples were sent to the laboratory as requested by the ordering clinical team.   Electronically Signed   By: Sandi Mariscal M.D.   On: 09/01/2014 16:50   US Thoracentesis Asp Pleural Space W/img Guide  08/26/2014   INDICATION: Symptomatic R sided pleural effusion  EXAM: US THORACENTESIS ASP PLEURAL SPACE W/IMG GUIDE  COMPARISON:  None.  MEDICATIONS: 10 cc 1% lidocaine  COMPLICATIONS: None immediate  TECHNIQUE: Informed written consent was obtained from the patient after a discussion of the risks, benefits and alternatives to treatment. A timeout was performed prior to the initiation of the procedure.  Initial ultrasound scanning demonstrates a R pleural effusion. The lower chest was prepped and draped in the usual sterile fashion. 1% lidocaine was used for local anesthesia.  Under direct ultrasound guidance, a 19 gauge, 7-cm, Yueh catheter was introduced. An ultrasound image was saved for documentation purposes. The thoracentesis was performed. The catheter was removed and a dressing was applied. The patient tolerated the procedure well without immediate post procedural complication. The patient was escorted to have an upright chest radiograph.  FINDINGS: A total of approximately 1 liters of bloody fluid was removed. Requested samples were sent to the laboratory.  IMPRESSION: Successful ultrasound-guided R sided thoracentesis yielding 1 liters of pleural fluid.  Read by:; Lavonia Drafts [facility]   Electronically Signed   By: Corrie Mckusick D.O.   On: 08/26/2014 15:53      ASSESSMENT AND PLAN: 1. Recurrent paroxysmal atrial fibrillation/flutter with RVR in the setting of severe lung dx - CHADS2VASC score 6 (hypertension, age above 77, history of TIA, and vascular disease with prior coronary artery stent) - Eliquis on hold. I spoke with pulmonary (Dr. Melvyn Novas), and he recommends restarting Monday, 7/4.               Previously went into PAF during last  admission for PNA in early June, reverted back to NSR as he recovered. - rate control continues to be difficult, loaded with digoxin IV 0.25 x 2 doses two days ago, started on 0.125mg  daily 7/1, however HR in 140s. Cannot consider TEE DCCV as long as systemic anticoagulation is not an option as post conversion risk for stroke is high. -metoprolol increased to 50mg  BID on 7/1. Previously had hypotension deemed secondary to dehydration. As lungs continue to heal, HR will become more easy to control. For now, no changes in Rx.  2. HCAP: management per IM  3. Pleural effusion s/p chest tube  4. CAD s/p DES to LAD in 2014, also noted to have ostial RCA occlusion with good L to R collateral - continue BB and statin, plavix on hold. Not on ASA given systemic anticoagulation need  5. COPD 6. CLL followed at [facility] 7. Anemia   Kate Sable, M.D., F.A.C.C.

## 2014-09-05 NOTE — Progress Notes (Addendum)
PULMONARY/CCM NOTE  Requesting MD/Service: August Saucer Date of admission: 6/21 Date of consult: 6/22 Reason for consultation:  parapneumonic effusion  Pt Profile:  41 M admitted to [facility] 6/04 - 6/09 with CAP. CXR on 6/09 revealed R effusion. He failed to recover as anticipated after discharge and readmitted 6/21 with large R effusion. Thoracentesis performed 6/22 revealed exudative characteristics (LDH 341, protein 3.7, with > 10,000 WBC, 93% neutrophils). Fluid was described as bloody. Pulm medicine asked to assist in further eval and mgmt of parapneumonic effusion   Significant tests/ events 6/23  A. fib RVR- transferred to stepdown unit 6/23 [place] of right chest - marked fibrinous tissue and concern for loculation t/o the right chest wall Rt chest tube 6/23 >>TPA 6/24: 1200 ml output, on 6/25 and 6/26 minimal output.  CT chest 6/26: Interval improvement in a moderate size right pleural fluid collection with associated compressive atelectasis over the right middle and lower lobes. Right-sided chest tube in with tip over the posterior pleural space in the most superior aspect of the pleural fluid collection. 6/28: posterior 12 fr chest tube placed in IR  6/29: right lateral 32 fr CT removed.    Subjective-  Nad at 30 degrees HOB on 3lpm NP   Filed Vitals:   09/05/14 0429 09/05/14 0743 09/05/14 0851 09/05/14 0852  BP: 138/69 146/93  164/98  Pulse: 74 123 142   Temp: 98.3 F (36.8 C) 97.9 F (36.6 C)    TempSrc: Oral Axillary    Resp: 18 18    Height:      Weight: 170 lb (77.111 kg)     SpO2: 100% 96%      EXAM:  Gen: WDWN, able to speak in full paragraphs HEENT: no jvd Neck: no LAN Lungs: decreased RLL .  Cardiovascular: IRIR, no M noted VR 144 Abdomen: soft, NT, +BS Ext: no C/C/E Neuro: awake, alert  DATA:  BMET    Component Value Date/Time   NA 132* 09/05/2014 0458   NA 144 04/03/2014 1023   K 5.1 09/05/2014 0458   CL 98* 09/05/2014 0458   CO2 25 09/05/2014 0458    GLUCOSE 105* 09/05/2014 0458   GLUCOSE 94 04/03/2014 1023   BUN 25* 09/05/2014 0458   BUN 16 04/03/2014 1023   CREATININE 1.22 09/05/2014 0458   CALCIUM 8.0* 09/05/2014 0458   GFRNONAA 51* 09/05/2014 0458   GFRAA 59* 09/05/2014 0458    CBC    Component Value Date/Time   WBC 17.4* 09/05/2014 0458   WBC 9.5 04/03/2014 1038   RBC 2.93* 09/05/2014 0458   RBC 4.2* 04/03/2014 1038   HGB 8.4* 09/05/2014 0458   HGB 12.0* 04/03/2014 1038   HCT 26.0* 09/05/2014 0458   HCT 40.0* 04/03/2014 1038   PLT 325 09/05/2014 0458   MCV 88.7 09/05/2014 0458   MCV 95.4 04/03/2014 1038   MCH 28.7 09/05/2014 0458   MCH 28.6 04/03/2014 1038   MCHC 32.3 09/05/2014 0458   MCHC 30.0* 04/03/2014 1038   RDW 23.3* 09/05/2014 0458   LYMPHSABS 5.6* 08/25/2014 0700   MONOABS 1.0 08/25/2014 0700   EOSABS 0.0 08/25/2014 0700   BASOSABS 0.0 08/25/2014 0700    pCXR  09/05/14 No change p ct out   IMPRESSION:   1) HCAP (healthcare-associated pneumonia) 2) Chronic lymphatic leukemia 3) R  effusion  parapneumonic, complex.  4) 02 dep respiratory failure  >D/c'd lateral 32 fr CT  6/29 as no output for 24 hrs. 12 fr Pigtail placed 6/29.  318ml blood tinged Pleural fluid pulled out 7/1  6/30 pleural fluid LDH 3452   PLAN:  F/u pending cultures/no growth to date  On Levaquin since 6/30 -   Remains afebrile   AF w/ RVR, anemia, hyponatremia  Plan Per IM and cards service.  RVR continues to be an issue, dig started 6/30. ? When to restart anticoagulation > would wait until 7/4 in case need to do another R tap     Christinia Gully, MD Pulmonary and [place] [phone] After 5:30 PM or weekends, call [phone]

## 2014-09-05 NOTE — Progress Notes (Signed)
[facility] PROGRESS NOTE  Johnathan Strong PYK:[id number] DOB: 11-Nov-1923 DOA: 08/25/2014 PCP: Redge Gainer, MD  Interim Summary Patient is a pleasant 81 year old gentleman with a past medical history of CML, recently discharged from the medicine service on 08/19/2014, admitted on 08/09/2014 during which he was treated for community-acquired pneumonia. Patient showed gradual improvement as he was weaned off of supplemental oxygen, tolerating by mouth intake, and was discharged on Ceftin. His wife reporting that patient did well the week after discharge, got back to his normal routine, then had a decline this past Monday, developing fevers, chills, night sweats. Chest x-ray performed on admission revealed moderate right pleural effusion with associated right lung base consolidation/collapse. He was started on broad-spectrum IV antibiotic therapy with vancomycin and ceftazidime, undergoing ultrasound-guided thoracentesis by interventional radiology on 08/26/2014. Pulmonary critical care medicine was consulted regarding pleural effusion as it was felt he would benefit chest tube. PCCM placed a 32 French chest tube on 08/27/2014 (rather than wayne cath) since there was marked fibrinous tissue and concern for loculation. Subsequent imagining showed incomplete drainage for which TPA was injected into pleural space. This seemed to help initially however subsequent injections did not yield significant fluid. PCCM recommended proceeding with IR consult for PIG-tail catheter, which was performed on 09/01/2014. Hospitalization was complicated by the moment of A. fib with RVR on 08/27/2014 likely precipitated by underlying pulmonary infectious process and acute hypoxemic respiratory failure. Controlling Afib has been a challenge. Dr Coralyn Pear consulted cardiology on 09/01/2014.   Assessment/Plan: #1 healthcare associated pneumonia with probable parapneumonic effusion Patient recently discharged on 08/19/2014 from the  hospital after being treated for community-acquired pneumonia who presented with a probable parapneumonic effusion. Patient underwent ultrasound-guided thoracentesis by interventional radiology with a liter of yellow colored fluid removed with a cell count of 10,299. Thoracentesis was consistent with exudative characteristics. No organisms seen on Gram stain. Blood cultures pending. Afebrile. WBC trending down. Patient also had chest tube placed on 08/27/2014 with 1200 mL output in all 625 and 626 minimal output. On 09/01/2014 posterior 12 French chest tube was placed per IR. 09/02/2014 patient had right lateral chest tube removed per critical care. Patient is afebrile. WBC trending down. Treatment options were discussed with patient and family including drain catheter versus repeat thoracentesis. VATS procedure was felt not to be well tolerated due to his multiple comorbidities, advanced age, A. fib with RVR. IV Tressie Ellis has been changed to oral levaquin. Pulmonary medicine gave TPA injections into pleural space due to amount of fibrous material and incomplete fluid drainage from the chest tube. Patient did have a repeat chest CT that showed interval improvement in right-sided pleural effusion. Patient was evaluated by Dr. Roxan Hockey of cardiothoracic surgery who did not recommend a VATS procedure. Patient had pigtail catheter placement done on 09/01/2014 per interventional radiology. Clinical improvement. Continue current therapy. Pulmonary critical care following.  #2 acute hypoxemic respiratory failure Likely multifactorial secondary to problem #1. Improving slowly.  #3 A. fib with RVR CHADS2VASC score 6. Patient was noted to have heart rates in the 140s overnight.Felt to be secondary to patient's pulmonary process. Patient has been seen in consultation by cardiology were adjusting patient's rate controlling medications. Patient blood pressure with some improvement. Metoprolol dose has been increased to  50 mg twice daily per cardiology. Diltiazem has been decreased to 240 mg daily per cardiology as patient was noted to be dizzy and hypotensive 3 days ago. Patient was also given some digoxin. Per cardiology. Pulmonary I recommended to possibly  resume anticoagulation 09/07/2014.   #4 coronary artery disease status post DES to the LAD in 2014 Stable. Continue beta blocker, statin. Plavix on hold. Per cardiology.  #5 sepsis Noted on admission as patient had a white count of 24.2, respiratory rate of 33 heart rate of 107 with pulse of infection being healthcare associated pneumonia. Blood cultures pending with no growth to date. Pleural fluid cultures pending. Continue empiric antibiotics.   #6 hypertension Due to hypotension, Cardizem dose was decreased back to his home regimen. Metoprolol dose has been increased slightly to 50 mg twice a day. Cozaar on hold.   #7 acute kidney injury Improvement. Stable. Follow.  #8 CLL Outpatient follow-up.  #9 prophylaxis SCDs for DVT prophylaxis.  Code Status: Full Family Communication: Updated patient, wife at bedside. Disposition Plan: Remain inpatient  Consultants:  Cardiology: Dr. Haroldine Laws 09/01/2014  CT surgery Dr. Roxan Hockey 08/28/2014  Critical care medicine Dr. Alva Garnet 08/26/2014  Procedures:  Ultrasound-guided thoracentesis 08/26/2014  CT guided drainage catheter placement into caudal aspect of right pleural space per Dr. Pascal Lux 09/01/2014  Right chest tube placement 08/27/2014  CT chest 08/30/2014, 08/25/2014  Right Chest tube removal per interventional radiology 09/04/2014  Antibiotics:  IV Fortaz 08/25/2014>>>>>>09/03/14  IV vancomycin 08/25/2014>>>> 09/01/2014  Oral levaquin 09/03/14  HPI/Subjective: Patient states slept better last night. Patient noted to have elevated heart rates and events last evening noted.  Objective: Filed Vitals:   09/05/14 1146  BP: 143/76  Pulse: 106  Temp: 97.6 F (36.4 C)  Resp: 20     Intake/Output Summary (Last 24 hours) at 09/05/14 1400 Last data filed at 09/05/14 0841  Gross per 24 hour  Intake    480 ml  Output    700 ml  Net   -220 ml   Filed Weights   09/03/14 0400 09/04/14 0456 09/05/14 0429  Weight: 73.573 kg (162 lb 3.2 oz) 76.159 kg (167 lb 14.4 oz) 77.111 kg (170 lb)    Exam:   General:  NAD  Cardiovascular: Irregularly irregular  Respiratory: Decreased breath sounds in the right base, otherwise clear. Right chest tube has been removed. Pigtail catheter in place.  Abdomen: Soft, nontender, nondistended, positive bowel sounds.  Musculoskeletal: No clubbing cyanosis or edema.  Data Reviewed: Basic Metabolic Panel:  Recent Labs Lab 09/01/14 0517 09/02/14 0316 09/03/14 0453 09/04/14 0343 09/05/14 0458  NA 128* 132* 132* 134* 132*  K 4.7 4.9 4.5 5.0 5.1  CL 100* 101 103 101 98*  CO2 23 21* 24 26 25   GLUCOSE 113* 103* 107* 101* 105*  BUN 30* 29* 28* 24* 25*  CREATININE 1.12 1.21 1.22 1.23 1.22  CALCIUM 7.9* 7.9* 8.0* 8.4* 8.0*  MG  --   --  2.0  --   --    Liver Function Tests: No results for input(s): AST, ALT, ALKPHOS, BILITOT, PROT, ALBUMIN in the last 168 hours. No results for input(s): LIPASE, AMYLASE in the last 168 hours. No results for input(s): AMMONIA in the last 168 hours. CBC:  Recent Labs Lab 09/01/14 0517 09/02/14 0316 09/03/14 0453 09/04/14 0343 09/05/14 0458  WBC 20.1* 16.5* 14.8* 16.9* 17.4*  HGB 9.4* 8.7* 8.5* 9.0* 8.4*  HCT 27.9* 26.6* 26.1* 27.9* 26.0*  MCV 87.2 89.9 88.8 88.9 88.7  PLT 246 230 287 296 325   Cardiac Enzymes: No results for input(s): CKTOTAL, CKMB, CKMBINDEX, TROPONINI in the last 168 hours. BNP (last 3 results)  Recent Labs  04/21/14 1541 08/25/14 0700  BNP 484.0* 224.5*  ProBNP (last 3 results) No results for input(s): PROBNP in the last 8760 hours.  CBG: No results for input(s): GLUCAP in the last 168 hours.  Recent Results (from the past 240 hour(s))  Culture,  body fluid-bottle     Status: None (Preliminary result)   Collection Time: 09/01/14  3:45 PM  Result Value Ref Range Status   Specimen Description PLEURAL FLUID  Final   Special Requests NONE  Final   Culture NO GROWTH 4 DAYS  Final   Report Status PENDING  Incomplete  Gram stain     Status: None   Collection Time: 09/01/14  3:45 PM  Result Value Ref Range Status   Specimen Description PLEURAL FLUID  Final   Special Requests NONE  Final   Gram Stain   Final    ABUNDANT WBC PRESENT,BOTH PMN AND MONONUCLEAR NO ORGANISMS SEEN    Report Status 09/02/2014 FINAL  Final     Studies: Dg Chest Port 1 View  09/05/2014   CLINICAL DATA:  Shortness of breath.  Respiratory failure.  EXAM: PORTABLE CHEST - 1 VIEW  COMPARISON:  One-view chest 09/04/2014  FINDINGS: The heart size is normal atherosclerotic changes are again seen at the aortic arch. Right basilar airspace disease is unchanged. Biapical pleural parenchymal scarring is stable.  IMPRESSION: 1. Stable right lower lobe interstitial and airspace disease concerning for pneumonia. 2. Stable right pleural effusion. 3. Atherosclerosis.   Electronically Signed   By: San Morelle M.D.   On: 09/05/2014 08:56   Dg Chest Port 1 View  09/04/2014   CLINICAL DATA:  Status post chest tube removal. Right pleural effusion.  EXAM: PORTABLE CHEST - 1 VIEW  COMPARISON:  09/04/2014 at 7:04 a.m.  FINDINGS: No pneumothorax.  Right lung base opacity consistent with a small residual effusion and associated atelectasis, unchanged from the earlier study. No pulmonary edema. No new lung abnormalities.  Cardiac silhouette normal in size. No mediastinal or hilar masses or evidence of adenopathy.  IMPRESSION: 1. No change from the earlier study. Persistent right lung base opacity most likely combination of atelectasis and a small pleural effusion. 2. No pneumothorax.   Electronically Signed   By: Lajean Manes M.D.   On: 09/04/2014 16:35   Dg Chest Port 1  View  09/04/2014   CLINICAL DATA:  Followup respiratory failure.  EXAM: PORTABLE CHEST - 1 VIEW  COMPARISON:  09/03/2014  FINDINGS: Persistent right lung base opacity is noted consistent with a combination of pleural fluid and atelectasis. There is minor persistent subsegmental atelectasis at the left lung base. No pulmonary edema. No pneumothorax.  Cardiopericardial silhouette is normal in size. No mediastinal or hilar masses.  IMPRESSION: 1. No change from the previous day's study. 2. Persistent right basilar opacity most likely combination of atelectasis and pleural fluid. 3. No pneumothorax.   Electronically Signed   By: Lajean Manes M.D.   On: 09/04/2014 08:13    Scheduled Meds: . atorvastatin  10 mg Oral q1800  . calcium carbonate  1 tablet Oral BID  . digoxin  0.125 mg Oral Daily  . diltiazem  240 mg Oral Daily  . feeding supplement (ENSURE ENLIVE)  237 mL Oral TID BM  . levofloxacin  500 mg Oral Q48H  . metoprolol tartrate  50 mg Oral BID  . montelukast  10 mg Oral QHS  . polyethylene glycol  17 g Oral Daily  . senna-docusate  1 tablet Oral BID   Continuous Infusions: . sodium chloride 10 mL/hr  at 09/01/14 1204    Principal Problem:   HCAP (healthcare-associated pneumonia) Active Problems:   Pleural effusion associated with pulmonary infection   CAD (coronary artery disease)   Atrial fibrillation   Chronic lymphatic leukemia   Pleural effusion on right   Sepsis   Pleural effusion   Pleural effusion, right   Blood poisoning   Chest tube in place   SOB (shortness of breath)    Time spent: 35 minutes    [facility] MD [facility] Pager [phone]. If 7PM-7AM, please contact night-coverage at [email or web address], password [facility] 09/05/2014, 2:00 PM  LOS: 11 days

## 2014-09-06 ENCOUNTER — Inpatient Hospital Stay (HOSPITAL_COMMUNITY): Payer: Medicare Other

## 2014-09-06 DIAGNOSIS — I10 Essential (primary) hypertension: Secondary | ICD-10-CM

## 2014-09-06 DIAGNOSIS — J438 Other emphysema: Secondary | ICD-10-CM

## 2014-09-06 DIAGNOSIS — I455 Other specified heart block: Secondary | ICD-10-CM

## 2014-09-06 LAB — CBC
HCT: 24.4 % — ABNORMAL LOW (ref 39.0–52.0)
HEMOGLOBIN: 7.9 g/dL — AB (ref 13.0–17.0)
MCH: 28.9 pg (ref 26.0–34.0)
MCHC: 32.4 g/dL (ref 30.0–36.0)
MCV: 89.4 fL (ref 78.0–100.0)
PLATELETS: 336 10*3/uL (ref 150–400)
RBC: 2.73 MIL/uL — AB (ref 4.22–5.81)
RDW: 23.5 % — ABNORMAL HIGH (ref 11.5–15.5)
WBC: 18.5 10*3/uL — AB (ref 4.0–10.5)

## 2014-09-06 LAB — BASIC METABOLIC PANEL
ANION GAP: 7 (ref 5–15)
BUN: 26 mg/dL — AB (ref 6–20)
CHLORIDE: 100 mmol/L — AB (ref 101–111)
CO2: 28 mmol/L (ref 22–32)
Calcium: 8 mg/dL — ABNORMAL LOW (ref 8.9–10.3)
Creatinine, Ser: 1.28 mg/dL — ABNORMAL HIGH (ref 0.61–1.24)
GFR calc Af Amer: 55 mL/min — ABNORMAL LOW (ref >60)
GFR calc non Af Amer: 48 mL/min — ABNORMAL LOW (ref >60)
Glucose, Bld: 106 mg/dL — ABNORMAL HIGH (ref 65–99)
Potassium: 4.5 mmol/L (ref 3.5–5.1)
Sodium: 135 mmol/L (ref 135–145)

## 2014-09-06 LAB — CULTURE, BODY FLUID W GRAM STAIN -BOTTLE: Culture: NO GROWTH

## 2014-09-06 LAB — CULTURE, BODY FLUID-BOTTLE

## 2014-09-06 MED ORDER — IPRATROPIUM-ALBUTEROL 0.5-2.5 (3) MG/3ML IN SOLN
3.0000 mL | Freq: Once | RESPIRATORY_TRACT | Status: AC
  Administered 2014-09-06: 3 mL via RESPIRATORY_TRACT
  Filled 2014-09-06: qty 3

## 2014-09-06 MED ORDER — METHYLPREDNISOLONE SODIUM SUCC 40 MG IJ SOLR
40.0000 mg | Freq: Once | INTRAMUSCULAR | Status: AC
  Administered 2014-09-06: 40 mg via INTRAVENOUS
  Filled 2014-09-06: qty 1

## 2014-09-06 MED ORDER — FUROSEMIDE 10 MG/ML IJ SOLN
40.0000 mg | Freq: Once | INTRAMUSCULAR | Status: AC
  Administered 2014-09-06: 40 mg via INTRAVENOUS
  Filled 2014-09-06: qty 4

## 2014-09-06 NOTE — Progress Notes (Signed)
SUBJECTIVE: Had difficulty breathing at 6 am, reportedly given Lasix and Solu-Medrol. Feeling well now. Daughter in room. CXR not performed yet. Denies chest pain. Had approximately 4 sec pause at 1 am.     Intake/Output Summary (Last 24 hours) at 09/06/14 0841 Last data filed at 09/06/14 0649  Gross per 24 hour  Intake    440 ml  Output    350 ml  Net     90 ml    Current Facility-Administered Medications  Medication Dose Route Frequency Provider Last Rate Last Dose  . 0.9 %  sodium chloride infusion   Intravenous Continuous Raylene Miyamoto, MD 10 mL/hr at 09/01/14 1204    . acetaminophen (TYLENOL) tablet 650 mg  650 mg Oral Q6H PRN Domenic Polite, MD   650 mg at 09/03/14 1750  . albuterol (PROVENTIL) (2.5 MG/3ML) 0.083% nebulizer solution 2.5 mg  2.5 mg Inhalation Q6H PRN Domenic Polite, MD      . atorvastatin (LIPITOR) tablet 10 mg  10 mg Oral q1800 Domenic Polite, MD   10 mg at 09/05/14 1708  . calcium carbonate (TUMS - dosed in mg elemental calcium) chewable tablet 200 mg of elemental calcium  1 tablet Oral BID Kelvin Cellar, MD   200 mg of elemental calcium at 09/06/14 0827  . digoxin (LANOXIN) tablet 0.125 mg  0.125 mg Oral Daily Almyra Deforest, PA   0.125 mg at 09/06/14 0827  . diltiazem (CARDIZEM CD) 24 hr capsule 240 mg  240 mg Oral Daily Almyra Deforest, [place]   240 mg at 09/06/14 0826  . diphenhydrAMINE (BENADRYL) capsule 25 mg  25 mg Oral QHS PRN Domenic Polite, MD   25 mg at 09/02/14 2251  . feeding supplement (ENSURE ENLIVE) (ENSURE ENLIVE) liquid 237 mL  237 mL Oral TID BM Kelvin Cellar, MD   237 mL at 09/06/14 1000  . fentaNYL (SUBLIMAZE) injection 25 mcg  25 mcg Intravenous Q2H PRN Erick Colace, NP      . lactulose (CHRONULAC) 10 GM/15ML solution 20 g  20 g Oral Daily PRN Kelvin Cellar, MD   20 g at 08/30/14 1022  . metoprolol (LOPRESSOR) injection 2.5 mg  2.5 mg Intravenous Q6H PRN Gardiner Barefoot, NP   2.5 mg at 09/04/14 0335  . metoprolol (LOPRESSOR)  tablet 50 mg  50 mg Oral BID Almyra Deforest, PA   50 mg at 09/06/14 0826  . montelukast (SINGULAIR) tablet 10 mg  10 mg Oral QHS Domenic Polite, MD   10 mg at 09/05/14 2111  . nitroGLYCERIN (NITROSTAT) SL tablet 0.4 mg  0.4 mg Sublingual Q5 min PRN Domenic Polite, MD      . ondansetron [facility]) tablet 4 mg  4 mg Oral Q6H PRN Domenic Polite, MD       Or  . ondansetron [facility]) injection 4 mg  4 mg Intravenous Q6H PRN Domenic Polite, MD      . oxyCODONE-acetaminophen (PERCOCET/ROXICET) 5-325 MG per tablet 1 tablet  1 tablet Oral Q4H PRN Wilhelmina Mcardle, MD   1 tablet at 09/01/14 1955  . polyethylene glycol (MIRALAX / GLYCOLAX) packet 17 g  17 g Oral Daily Kelvin Cellar, MD   17 g at 09/05/14 0854  . senna-docusate (Senokot-S) tablet 1 tablet  1 tablet Oral BID Kelvin Cellar, MD   1 tablet at 09/05/14 [phone]  . traMADol (ULTRAM) tablet 50 mg  50 mg Oral Q12H PRN Domenic Polite, MD   50 mg at 09/05/14  2111    Filed Vitals:   09/05/14 1515 09/05/14 2043 09/06/14 0110 09/06/14 0428  BP: 146/64 111/70 113/57 137/51  Pulse: 88 115 72 78  Temp: 99.5 F (37.5 C) 99 F (37.2 C)  98.8 F (37.1 C)  TempSrc: Oral Oral  Oral  Resp: 31 31 21 18   Height:      Weight:    168 lb 3.4 oz (76.3 kg)  SpO2: 100% 100% 99% 98%    PHYSICAL EXAM General: NAD HEENT: Normal. Neck: No JVD, no thyromegaly.  Lungs: Diminished at left base, absent sounds at right base CV: Regular rate and rhythm, normal S1/S2, no S3, no murmur. No pretibial edema.  Abdomen: Soft, nontender, no hepatosplenomegaly, no distention.  Neurologic: Alert and oriented.  Psych: Normal affect. Musculoskeletal: No gross deformities. Extremities: No clubbing or cyanosis.   TELEMETRY: Sinus rhythm, approx 4 sec pause at 1 am. Previously in rapid afib/flutter.   LABS: Basic Metabolic Panel:  Recent Labs  09/05/14 0458 09/06/14 0416  NA 132* 135  K 5.1 4.5  CL 98* 100*  CO2 25 28  GLUCOSE 105* 106*  BUN 25* 26*  CREATININE  1.22 1.28*  CALCIUM 8.0* 8.0*   Liver Function Tests: No results for input(s): AST, ALT, ALKPHOS, BILITOT, PROT, ALBUMIN in the last 72 hours. No results for input(s): LIPASE, AMYLASE in the last 72 hours. CBC:  Recent Labs  09/05/14 0458 09/06/14 0416  WBC 17.4* 18.5*  HGB 8.4* 7.9*  HCT 26.0* 24.4*  MCV 88.7 89.4  PLT 325 336   Cardiac Enzymes: No results for input(s): CKTOTAL, CKMB, CKMBINDEX, TROPONINI in the last 72 hours. BNP: Invalid input(s): POCBNP D-Dimer: No results for input(s): DDIMER in the last 72 hours. Hemoglobin A1C: No results for input(s): HGBA1C in the last 72 hours. Fasting Lipid Panel: No results for input(s): CHOL, HDL, LDLCALC, TRIG, CHOLHDL, LDLDIRECT in the last 72 hours. Thyroid Function Tests: No results for input(s): TSH, T4TOTAL, T3FREE, THYROIDAB in the last 72 hours.  Invalid input(s): FREET3 Anemia Panel: No results for input(s): VITAMINB12, FOLATE, FERRITIN, TIBC, IRON, RETICCTPCT in the last 72 hours.  RADIOLOGY: Dg Chest 1 View  08/26/2014   CLINICAL DATA:  Post right thoracentesis, pleural effusion.  EXAM: CHEST  1 VIEW  COMPARISON:  08/25/2014  FINDINGS: Slight decreased size of the right effusion which remains moderate. No visible pneumothorax. Continued right lower lobe atelectasis or infiltrate. Left lung is clear. Heart is normal size. No acute bony abnormality.  IMPRESSION: No pneumothorax following thoracentesis. Continued moderate right effusion with right lower lobe atelectasis or infiltrate.   Electronically Signed   By: Rolm Baptise M.D.   On: 08/26/2014 11:42   Dg Chest 2 View  08/31/2014   CLINICAL DATA:  Right pleural effusion with chest tube drainage, shortness of breath.  EXAM: CHEST  2 VIEW  COMPARISON:  Chest x-ray of June Jul 29, 2014 and chest CT scan of August 30, 2014.  FINDINGS: On the right there is mild volume loss and a moderate-sized pleural effusion. The right chest tube tip projects over the posterior aspect of  the seventh rib and is unchanged. There is atelectasis or pneumonia at the right lung base which is stable. The left lung is well-expanded. There is a stable approximately 11 mm calcified nodule in the left lower lobe. There is no mediastinal shift. The cardiac silhouette is mildly enlarged. The pulmonary vascularity is normal. The bony thorax exhibits no acute abnormality.  IMPRESSION: Persistent moderate-sized inferior  right pleural effusion. The tip of the right chest tube lies along the superior aspect of the fluid collection similar to the positioning described on the CT scan.  Mild stable enlargement of the cardiac silhouette without pulmonary edema.  Evidence of previous granulomatous infection.   Electronically Signed   By: David  Martinique M.D.   On: 08/31/2014 09:19   Dg Chest 2 View  08/25/2014   CLINICAL DATA:  81 year old male with shortness of breath and chest pain since last night. Pain radiating to the right shoulder with chills. Initial encounter. Recent diagnosis of pneumonia.  EXAM: CHEST  2 VIEW  COMPARISON:  08/13/2014 and earlier.  FINDINGS: Seated upright AP and lateral views of the chest. Continued moderate size right pleural effusion with dense right lung base opacification. Lower lung volumes. No pneumothorax or pulmonary edema. Stable cardiac size and mediastinal contours. The left lung remains clear aside from evidence of a chronic lower lobe pulmonary nodule which has not significantly changed since 2013. Calcified atherosclerosis of the aorta again noted. No acute osseous abnormality identified.  IMPRESSION: 1. Continued moderate right pleural effusion with associated right lung base collapse/consolidation. 2. No new cardiopulmonary abnormality. Chronic left lower lobe lung nodule.   Electronically Signed   By: Genevie Ann M.D.   On: 08/25/2014 08:17   Dg Chest 2 View  08/13/2014   CLINICAL DATA:  Shortness of breath weakness pneumonia  EXAM: CHEST  2 VIEW  COMPARISON:  08/11/2014   FINDINGS: Moderate right effusion with underlying consolidation stable. Heart size and vascular pattern normal. No evidence of infiltrate on the left. Dense circumscribed oval 1 cm nodule to the left of the descending thoracic aorta stable from 02/26/2012 and therefore considered to be benign. This projects over the spine on the lateral radiograph and it is likely a granuloma given its density and stability.  IMPRESSION: No change from prior study. Moderate right effusion and lower lobe consolidation.   Electronically Signed   By: Skipper Cliche M.D.   On: 08/13/2014 14:08   Dg Chest 2 View  08/11/2014   CLINICAL DATA:  Chest pain  EXAM: CHEST  2 VIEW  COMPARISON:  08/10/2014  FINDINGS: Heart is normal size. Consolidation in the right lower lobe concerning for pneumonia. Moderate right pleural effusion. Left lung is clear. No acute bony abnormality.  IMPRESSION: Continued right lower lobe consolidation and moderate right effusion, stable or slightly worsened since prior study.   Electronically Signed   By: Rolm Baptise M.D.   On: 08/11/2014 15:26   Dg Chest 2 View  08/10/2014   CLINICAL DATA:  Pneumonia, shortness of breath.  EXAM: CHEST  2 VIEW  COMPARISON:  PA and lateral chest x-ray of August 09, 2014  FINDINGS: The left lung is well-expanded. On the right there is persistent pleural fluid and increased basilar parenchymal density. The heart and pulmonary vascularity are normal. The mediastinum is normal in width. There is calcification in the wall of the thoracic aorta. There is mild gaseous distention of bowel under the left hemidiaphragm. The bony thorax exhibits no acute abnormality.  IMPRESSION: Persistent right lower lobe atelectasis or pneumonia and small right pleural effusion. There is likely underlying COPD. There has been interval development of minimal left basilar subsegmental atelectasis.   Electronically Signed   By: David  Martinique M.D.   On: 08/10/2014 07:53   Ct Chest Wo Contrast  08/30/2014    CLINICAL DATA:  Right pleural effusion and right-sided chest tube. Patient receiving intrapleural  tPA.  EXAM: CT CHEST WITHOUT CONTRAST  TECHNIQUE: Multidetector CT imaging of the chest was performed following the standard protocol without IV contrast.  COMPARISON:  CT 08/25/2014 and chest x-ray 08/29/2014  FINDINGS: Right anterior lateral chest tube courses into the posterior pleural space at the level of the mid lungs as tip is within the most superior aspect of the pleural fluid collection. Minimal subcutaneous emphysema over the right flank. There is a moderate size right pleural effusion with mild interval improvement. There is continued compressive atelectasis over the right lower lobe and right middle lobes. Small collection of air within the pleural space over the anterior aspect of the pleural effusion near the entry site at the chest tube. Calcified granuloma over the left lower lobe. Minimal scarring over the posterior left lower lobe. Airways are within normal.  Mild stable cardiomegaly. Calcification of the mitral valve annulus. Moderate calcification of the left main and 3 vessel coronary arteries unchanged. Coronary stents present. Tiny amount of pericardial fluid present. Mild-to-moderate calcified plaque over the thoracic aorta. No mediastinal, hilar or axillary adenopathy. Stable sub cm lymph node over the right retrocrural region at the level of the diaphragm.  Images through the upper abdomen are unchanged. There are degenerative changes of the spine.  IMPRESSION: Interval improvement in a moderate size right pleural fluid collection with associated compressive atelectasis over the right middle and lower lobes. Right-sided chest tube in with tip over the posterior pleural space in the most superior aspect of the pleural fluid collection.  Prior granulomas disease.  Atherosclerotic coronary artery disease with coronary stents present. Mild calcification of the mitral valve annulus. Mild  cardiomegaly.   Electronically Signed   By: Marin Olp M.D.   On: 08/30/2014 09:31   Ct Chest W Contrast  08/25/2014   CLINICAL DATA:  Pleurisy and increase shortness of breath  EXAM: CT CHEST WITH CONTRAST  TECHNIQUE: Multidetector CT imaging of the chest was performed during intravenous contrast administration.  CONTRAST:  70mL OMNIPAQUE IOHEXOL 300 MG/ML  SOLN  COMPARISON:  None.  FINDINGS: THORACIC INLET/BODY WALL:  No acute abnormality.  MEDIASTINUM:  Normal heart size. No pericardial effusion. Extensive coronary atherosclerosis. No acute vascular abnormality. No adenopathy.  LUNG WINDOWS:  Large right pleural effusion which is water density. The fluid is predominantly free-flowing based on comparison to previous chest x-ray, but there is some complex features with scalloping of the lung margins. The right lower lobe and lateral segment right middle lobe are collapsed. There is no evidence of pleural nodularity or lung mass. No pneumonia is suspected.  Calcified granuloma in the left lower lobe.  UPPER ABDOMEN:  Partly visualized left renal cyst, imaged portions stable from 09/29/2008 abdominal CT  OSSEOUS:  No acute fracture.  No suspicious lytic or blastic lesions.  IMPRESSION: Large right pleural effusion with lower lobe and middle lobe segmental collapse. The effusion is predominantly free-flowing based on prior chest x-ray, but there may be partial loculation where the lung is scalloped . No pleural or pulmonary cause is identified.   Electronically Signed   By: Monte Fantasia M.D.   On: 08/25/2014 15:09   Dg Chest Port 1 View  09/05/2014   CLINICAL DATA:  Shortness of breath.  Respiratory failure.  EXAM: PORTABLE CHEST - 1 VIEW  COMPARISON:  One-view chest 09/04/2014  FINDINGS: The heart size is normal atherosclerotic changes are again seen at the aortic arch. Right basilar airspace disease is unchanged. Biapical pleural parenchymal scarring is stable.  IMPRESSION: 1. Stable right lower lobe  interstitial and airspace disease concerning for pneumonia. 2. Stable right pleural effusion. 3. Atherosclerosis.   Electronically Signed   By: San Morelle M.D.   On: 09/05/2014 08:56   Dg Chest Port 1 View  09/04/2014   CLINICAL DATA:  Status post chest tube removal. Right pleural effusion.  EXAM: PORTABLE CHEST - 1 VIEW  COMPARISON:  09/04/2014 at 7:04 a.m.  FINDINGS: No pneumothorax.  Right lung base opacity consistent with a small residual effusion and associated atelectasis, unchanged from the earlier study. No pulmonary edema. No new lung abnormalities.  Cardiac silhouette normal in size. No mediastinal or hilar masses or evidence of adenopathy.  IMPRESSION: 1. No change from the earlier study. Persistent right lung base opacity most likely combination of atelectasis and a small pleural effusion. 2. No pneumothorax.   Electronically Signed   By: Lajean Manes M.D.   On: 09/04/2014 16:35   Dg Chest Port 1 View  09/04/2014   CLINICAL DATA:  Followup respiratory failure.  EXAM: PORTABLE CHEST - 1 VIEW  COMPARISON:  09/03/2014  FINDINGS: Persistent right lung base opacity is noted consistent with a combination of pleural fluid and atelectasis. There is minor persistent subsegmental atelectasis at the left lung base. No pulmonary edema. No pneumothorax.  Cardiopericardial silhouette is normal in size. No mediastinal or hilar masses.  IMPRESSION: 1. No change from the previous day's study. 2. Persistent right basilar opacity most likely combination of atelectasis and pleural fluid. 3. No pneumothorax.   Electronically Signed   By: Lajean Manes M.D.   On: 09/04/2014 08:13   Dg Chest Port 1 View  09/03/2014   CLINICAL DATA:  Respiratory failure, right-sided chest tube  EXAM: PORTABLE CHEST - 1 VIEW  COMPARISON:  Portable chest x-ray of September 02, 2014  FINDINGS: The large caliber right chest tube has been removed. The pigtail catheter remains in place with the pigtail located in the medial costophrenic  gutter. There is stable increased density at the right lung base consistent with pleural fluid and/or pleural thickening. There is no pneumothorax. The left lung is clear. The heart and mediastinal structures are normal. The observed bony thorax is unremarkable.  IMPRESSION: Interval removal of the large caliber chest tube with stable positioning of the small caliber tube. There is stable increased density at the right lung base consistent with pleural fluid or thickening and likely atelectasis.   Electronically Signed   By: David  Martinique M.D.   On: 09/03/2014 07:27   Dg Chest Port 1 View  09/02/2014   CLINICAL DATA:  Chest tube treatment of right pleural effusion  EXAM: PORTABLE CHEST - 1 VIEW  COMPARISON:  Chest x-ray of August 31, 2014  FINDINGS: The large caliber chest tube tip on the right projects over the posterior aspect of the seventh rib. The new small caliber tube has its pigtail overlying the medial costovertebral angle on the right. There is persistent right lower lobe atelectasis. The left lung is clear. There is no mediastinal shift. There is no pneumothorax. A small amount of pleural fluid likely remains on the right. The heart is normal in size. The pulmonary vascularity is mildly prominent centrally. The bony thorax exhibits no acute abnormality.  IMPRESSION: Stable appearance of the right hemithorax with the presence of 2 chest tubes. There remains parenchymal consolidation as well as small amount of pleural fluid on the right.   Electronically Signed   By: David  Martinique M.D.  On: 09/02/2014 07:41   Dg Chest Port 1 View  08/29/2014   CLINICAL DATA:  Evaluate pleural effusions  EXAM: PORTABLE CHEST - 1 VIEW  COMPARISON:  08/28/2014  FINDINGS: Right chest tube in place. No pneumothorax identified. The right-sided pleural effusion is unchanged in volume from previous exam. There is atelectasis in the right base. Gas is noted within the soft tissues of the right chest wall surrounding the chest  tube.  IMPRESSION: 1. No change in right pleural effusion.   Electronically Signed   By: Kerby Moors M.D.   On: 08/29/2014 13:12   Dg Chest Port 1 View  08/28/2014   CLINICAL DATA:  Chest tube placement for effusion.  EXAM: PORTABLE CHEST - 1 VIEW  COMPARISON:  Study obtained earlier in the day  FINDINGS: There is a chest tube on the right without demonstrable pneumothorax. Right effusion is smaller. There is a fairly small right effusion with right base consolidation and patchy atelectasis. Lungs elsewhere clear. There is no appreciable interstitial edema. The heart size and pulmonary vascularity are normal. There is atherosclerotic change in the aorta. No adenopathy. No bone lesions.  IMPRESSION: Chest tube in place on the right. No pneumothorax. Note that there is soft tissue air tracking along the chest tube in the lateral right hemithorax inferiorly. Right pleural effusion is smaller compared to earlier in the day. There is a residual right effusion with right base consolidation and patchy atelectasis.  There is no evidence of pulmonary edema. No change in cardiac silhouette.   Electronically Signed   By: Lowella Grip III M.D.   On: 08/28/2014 15:19   Dg Chest Port 1 View  08/28/2014   CLINICAL DATA:  Pleural effusion.  Pulmonary infection.  EXAM: PORTABLE CHEST - 1 VIEW  COMPARISON:  08/27/2014.  FINDINGS: Right chest tube in stable position. Mediastinum and hilar structures are normal. Cardiomegaly with normal pulmonary vascularity. Right lower lobe infiltrate consistent with pneumonia. Right pleural effusion. Right chest wall subcutaneous emphysema again noted.  IMPRESSION: 1.  Right chest tube in stable position.  No pneumothorax.  2. Persistent dense right lower lobe infiltrate and right pleural effusion. No interim change.   Electronically Signed   By: Marcello Moores  Register   On: 08/28/2014 08:15   Dg Chest Port 1 View  08/27/2014   CLINICAL DATA:  Chest tube placement.  EXAM: PORTABLE CHEST -  1 VIEW  COMPARISON:  Single view of the chest 08/26/2014.  FINDINGS: A new right chest tube is in place. Right pleural effusion and basilar airspace disease are again seen. No pneumothorax is identified. The left lung appears clear. Heart size is normal.  IMPRESSION: Status post right chest tube placement. Right pleural effusion basilar airspace disease do not appear notably changed. No pneumothorax is seen.   Electronically Signed   By: Inge Rise M.D.   On: 08/27/2014 18:30   Dg Swallowing Func-speech Pathology  09/01/2014    Objective Swallowing Evaluation:    Patient Details  Name: OLUWATIMILEYIN VIVIER MRN: [id number] Date of Birth: 10/11/1923  Today's Date: 09/01/2014 Time: SLP Start Time (ACUTE ONLY): 1245-SLP Stop Time (ACUTE ONLY): 1320 SLP Time Calculation (min) (ACUTE ONLY): 35 min  Past Medical History:  Past Medical History  Diagnosis Date  . Osteopenia 2006  . Hyperlipidemia   . BPH (benign prostatic hypertrophy)   . Transient global amnesia   . Osteoarthritis   . History of TIA (transient ischemic attack) 1980'S    NO RESIDUAL  .  COPD (chronic obstructive pulmonary disease) with emphysema   . Chronic back pain   . Itching SECONDARY TO CLL-- CONTROLLED W/ SINGULAIR  . Frequency   . Nocturia   . Hypertension CARDIOLOGIST- DR BRACKBILL-- LAST VISIT 12-15-2010 NOTE IN  EPIC  . CLL (chronic lymphocytic leukemia) LAST PLT COUNT APRL 2012  168    Managed at [place]  . Bladder cancer   . Aspirin allergy     Eyes swell  . Asthma   . Dyspnea on exertion   . CAD (coronary artery disease)     a. 08/2012 Cath: LM nl, LAD 90p, LCX 20-30, RCA 100ost fills via L->R  collats, EF 55-65%;  b. 09/2012 PCI of prox LAD with 3.0x16 Promus DES.   Past Surgical History:  Past Surgical History  Procedure Laterality Date  . Excisional hemorrhoidectomy  1980  . Cardiovascular stress test  07/2005    a. 07/2005- no reversible ischemia, normal EF b. 06/2009- EF 73%, no  reversible ischemia, inferolateral TWIs at rest,  upright in recovery c.  08/2012- mediuim-sized partially reversible basal to mid inferior and  inferoseptal perfusion defect c/w with prior infarction and peri-infarct  ischemia; EF 68% and borderline ST/T changes on stress ECG   . Transurethral resection of bladder tumor  10-19-2008    AND DILATION URETHRAL STRICTURE  . Cataract extraction w/ intraocular lens  implant, bilateral Bilateral ~  2007  . Transthoracic echocardiogram  01-03-2011    LVEF 46-27%, grade 1 diastolic dysfunction, no WMAs or structural  abnormalities  . Cystoscopy with biopsy  10/30/2011    Procedure: CYSTOSCOPY WITH BIOPSY;  Surgeon: Claybon Jabs, MD;   Location: [facility];  Service: Urology;  Laterality: N/A;   . Cardiac catheterization  08/27/2012  . Coronary angioplasty with stent placement  09/26/2012    "1" (09/26/2012)  . Coronary stent placement  09/26/12  . Left heart catheterization with coronary angiogram N/A 08/27/2012    Procedure: LEFT HEART CATHETERIZATION WITH CORONARY ANGIOGRAM;  Surgeon:  Peter M Martinique, MD;  Location: [facility] CATH LAB;  Service: Cardiovascular;   Laterality: N/A;  . Percutaneous coronary stent intervention (pci-s) N/A 09/26/2012    Procedure: PERCUTANEOUS CORONARY STENT INTERVENTION (PCI-S);  Surgeon:  Peter M Martinique, MD;  Location: [facility] CATH LAB;  Service: Cardiovascular;   Laterality: N/A;   HPI:  Other Pertinent Information: 81 y.o. male with past history of COPD,  paroxysmal A. fib on Eliquis, CAD, hypertension was recently discharged  from [facility] 6/9 after treatment for community-acquired  pneumonia. He presented to the ER where he was noted to have a white count  of 24K, chest x-ray with worsening pneumonia and moderate right pleural  effusion. Pt with chest tube.Previous admission for RLL PNA  with bedside  swallow completed 6/9: notes WFL oropharyngeal swallow with suspected  esophageal component.   No Data Recorded  Assessment / Plan / Recommendation CHL IP CLINICAL IMPRESSIONS  09/01/2014  Therapy Diagnosis Mild oral phase dysphagia;Suspected primary esophageal  dysphagia  Clinical Impression Pt demonstrates very mild oral dysphagia warranting  basic precautions to reduce risk of aspiration. The pt has trace premature  spillage and trace oral residuals after the swallow, both of which spill  to the valleculae and pyriforms prior to the swallow with 1-2 instances of  trace penetration, expelled with a swallow. Oropharyngeal function is  otherwise WNL. An esophageal sweep does reveal stasis throughout the  distal to mid esophagus with  upward movement of residual. The barium  tablet remained at the GE junction at the end of the test. Pt is  recommended to follow precautions to reduce risk of aspiration including  upright positioning, a second swallow and staying upright 30-60 minutes  after meal. Despite the precautions the pts highest risk is aspiration of  esophageal stasis after meals. Suggest f/u esophageal testing. SLP will  f/u for reinforcement of precautions.       CHL IP TREATMENT RECOMMENDATION 09/01/2014  Treatment Recommendations Therapy as outlined in treatment plan below     CHL IP DIET RECOMMENDATION 09/01/2014  SLP Diet Recommendations Age appropriate regular solids;Thin  Liquid Administration via (None)  Medication Administration Whole meds with liquid  Compensations Small sips/bites  Postural Changes and/or Swallow Maneuvers (None)     CHL IP OTHER RECOMMENDATIONS 09/01/2014  Recommended Consults Consider esophageal assessment;Consider GI evaluation   Oral Care Recommendations Oral care BID  Other Recommendations (None)     No flowsheet data found.   CHL IP FREQUENCY AND DURATION 09/01/2014  Speech Therapy Frequency (ACUTE ONLY) min 2x/week  Treatment Duration 1 week       No flowsheet data found.  No flowsheet data found.        Herbie [place], [place] CCC-SLP [phone]  DeBlois, Katherene Ponto 09/01/2014, 2:57 PM    Ct Image Guided Drainage By Percutaneous Catheter  09/01/2014    INDICATION: History of community acquired pneumonia, post right-sided chest tube placement with residual loculated fluid seen within the caudal aspect of the right pleural space. Request made for placement of additional CT-guided percutaneous chest tube for infection source control purposes.  EXAM: CT IMAGE GUIDED DRAINAGE BY PERCUTANEOUS CATHETER  COMPARISON:  Chest CT- 08/10/2014; 08/25/2014; chest radiograph - 08/31/2014; ultrasound-guided right-sided thoracentesis - 08/26/2014  MEDICATIONS: The patient is currently admitted to the hospital and receiving intravenous antibiotics. The antibiotics were administered within an appropriate time frame prior to the initiation of the procedure.  ANESTHESIA/SEDATION: Fentanyl 25 mcg IV; Versed 5 mg IV  Total Moderate Sedation time  20 minutes  CONTRAST:  None  COMPLICATIONS: None immediate  PROCEDURE: Informed written consent was obtained from the patient after a discussion of the risks, benefits and alternatives to treatment. The patient was placed right lateral decubitus on the CT gantry and a pre procedural CT was performed re-demonstrating the known loculated fluid collection within the right pleural space. The procedure was planned. A timeout was performed prior to the initiation of the procedure.  The right inferior posterior lateral chest wall was prepped and draped in the usual sterile fashion. The overlying soft tissues were anesthetized with 1% lidocaine with epinephrine. Appropriate trajectory was planned with the use of a 22 gauge spinal needle. An 18 gauge trocar needle was advanced into the right pleural space and a short Amplatz super stiff wire was coiled within the collection. Appropriate positioning was confirmed with a limited CT scan. The tract was serially dilated allowing placement of a 12 [place] all-purpose drainage catheter. Appropriate positioning was confirmed with a limited postprocedural CT scan demonstrating the and of the percutaneous drainage  catheter coiled and locked within the posterior medial aspect of the right pleural space (series 7).  Approximately 50 cc blood tinged pleural fluid was aspirated. The tube was connected to a pleural vac device and sutured in place. A dressing was placed. The patient tolerated the procedure well without immediate post procedural complication.  IMPRESSION: Successful CT guided placement of a 56 French all purpose drain  catheter into the caudal aspect of the right pleural space with aspiration of approximately 50 mL of blood tinged pleural fluid. Samples were sent to the laboratory as requested by the ordering clinical team.   Electronically Signed   By: Sandi Mariscal M.D.   On: 09/01/2014 16:50   US Thoracentesis Asp Pleural Space W/img Guide  08/26/2014   INDICATION: Symptomatic R sided pleural effusion  EXAM: US THORACENTESIS ASP PLEURAL SPACE W/IMG GUIDE  COMPARISON:  None.  MEDICATIONS: 10 cc 1% lidocaine  COMPLICATIONS: None immediate  TECHNIQUE: Informed written consent was obtained from the patient after a discussion of the risks, benefits and alternatives to treatment. A timeout was performed prior to the initiation of the procedure.  Initial ultrasound scanning demonstrates a R pleural effusion. The lower chest was prepped and draped in the usual sterile fashion. 1% lidocaine was used for local anesthesia.  Under direct ultrasound guidance, a 19 gauge, 7-cm, Yueh catheter was introduced. An ultrasound image was saved for documentation purposes. The thoracentesis was performed. The catheter was removed and a dressing was applied. The patient tolerated the procedure well without immediate post procedural complication. The patient was escorted to have an upright chest radiograph.  FINDINGS: A total of approximately 1 liters of bloody fluid was removed. Requested samples were sent to the laboratory.  IMPRESSION: Successful ultrasound-guided R sided thoracentesis yielding 1 liters of pleural fluid.  Read by:;  Lavonia Drafts [facility]   Electronically Signed   By: Corrie Mckusick D.O.   On: 08/26/2014 15:53      ASSESSMENT AND PLAN: 1. Recurrent paroxysmal atrial fibrillation/flutter with RVR in the setting of severe lung dx - CHADS2VASC score 6 (hypertension, age above 62, history of TIA, and vascular disease with prior coronary artery stent) - Eliquis on hold. I spoke with pulmonary (Dr. Melvyn Novas) yesterday, and he recommended restarting Monday, 7/4. Will wait to see if he needs to be tapped again given absent breath sounds at right base.  Previously went into PAF during last admission for PNA in early June, reverted back to NSR as he recovered. - Has reverted to normal sinus rhythm. Continue metoprolol 50mg  BID (increased on 7/1). Previously had hypotension deemed secondary to dehydration. Continue long-acting diltiazem 240 mg and digoxin  0.125 mg daily.  2. HCAP: management per IM  3. Parapneumonic effusion s/p chest tube: Await chest xray. May need to be tapped again. Absent breath sounds at right base.  4. CAD s/p DES to LAD in 2014, also noted to have ostial RCA occlusion with good L to R collateral - continue BB and statin, plavix on hold. Not on ASA given systemic anticoagulation need  5. COPD 6. CLL followed at [facility] 7. Anemia   Kate Sable, M.D., F.A.C.C.

## 2014-09-06 NOTE — Progress Notes (Signed)
Duoneb given per MD order.  Pt reports that he is breathing better.  No longer wheezing.  Lungs clear, diminished in lower lobes.  IV leaking.  New IV started and IV lasix and solumedrol given per MD order.  Awaiting CXR.  VSS.  Pt in NAD.  Granddaughter at bedside.  Encouraged pt to notify RN if SOB or diaphoretic.  Will continue to monitor. Jodell Cipro

## 2014-09-06 NOTE — Progress Notes (Signed)
Pt converted to SR after a 3.91 second pause.  VSS.  EKG shows SR with 1st degree AV block, HR 72.  Dr. Marlowe Sax notified.  Will continue to monitor.  Johnathan Strong

## 2014-09-06 NOTE — Progress Notes (Signed)
Pt c/o SOB and feeling sweaty.  Diaphoresis noted.  Lung sounds wheezy and wet.  HOB elevated to 40 degrees.  BP 171/58, HR 89, RR 24, O2 sats 98% on 3L/Johnathan Strong.  Dr. Reece Levy notified and orders received.

## 2014-09-06 NOTE — Progress Notes (Signed)
[facility] PROGRESS NOTE  Johnathan Strong WUJ:[id number] DOB: [birth year] DOA: 08/25/2014 PCP: Redge Gainer, MD  Interim Summary Patient is a pleasant 81 year old gentleman with a past medical history of CML, recently discharged from the medicine service on 08/19/2014, admitted on 08/09/2014 during which he was treated for community-acquired pneumonia. Patient showed gradual improvement as he was weaned off of supplemental oxygen, tolerating by mouth intake, and was discharged on Ceftin. His wife reporting that patient did well the week after discharge, got back to his normal routine, then had a decline this past Monday, developing fevers, chills, night sweats. Chest x-ray performed on admission revealed moderate right pleural effusion with associated right lung base consolidation/collapse. He was started on broad-spectrum IV antibiotic therapy with vancomycin and ceftazidime, undergoing ultrasound-guided thoracentesis by interventional radiology on 08/26/2014. Pulmonary critical care medicine was consulted regarding pleural effusion as it was felt he would benefit chest tube. PCCM placed a 32 French chest tube on 08/27/2014 (rather than wayne cath) since there was marked fibrinous tissue and concern for loculation. Subsequent imagining showed incomplete drainage for which TPA was injected into pleural space. This seemed to help initially however subsequent injections did not yield significant fluid. PCCM recommended proceeding with IR consult for PIG-tail catheter, which was performed on 09/01/2014. Hospitalization was complicated by the moment of A. fib with RVR on 08/27/2014 likely precipitated by underlying pulmonary infectious process and acute hypoxemic respiratory failure. Controlling Afib has been a challenge. Dr Coralyn Pear consulted cardiology on 09/01/2014.   Assessment/Plan: #1 healthcare associated pneumonia with probable parapneumonic effusion Patient recently discharged on 08/19/2014 from the  hospital after being treated for community-acquired pneumonia who presented with a probable parapneumonic effusion. Patient underwent ultrasound-guided thoracentesis by interventional radiology with a liter of yellow colored fluid removed with a cell count of 10,299. Thoracentesis was consistent with exudative characteristics. No organisms seen on Gram stain. Blood cultures pending. Afebrile. WBC trending down. Patient also had chest tube placed on 08/27/2014 with 1200 mL output in all 625 and 626 minimal output. On 09/01/2014 posterior 12 French chest tube was placed per IR. 09/02/2014 patient had right lateral chest tube removed per critical care. Patient is afebrile. WBC trending down. Treatment options were discussed with patient and family including drain catheter versus repeat thoracentesis. VATS procedure was felt not to be well tolerated due to his multiple comorbidities, advanced age, A. fib with RVR. IV Tressie Ellis has been changed to oral levaquin. Pulmonary medicine gave TPA injections into pleural space due to amount of fibrous material and incomplete fluid drainage from the chest tube. Patient did have a repeat chest CT that showed interval improvement in right-sided pleural effusion. Patient was evaluated by Dr. Roxan Hockey of cardiothoracic surgery who did not recommend a VATS procedure. Patient had pigtail catheter placement done on 09/01/2014 per interventional radiology. Second chest tube was removed. Repeat chest x-ray done this morning with no interval change in the chest. Persistent right basilar collapse/consolidation and effusion. Clinical improvement. Continue current therapy. Per Pulmonary critical care.  #2 acute hypoxemic respiratory failure Likely multifactorial secondary to problem #1. Improving slowly.  #3 A. fib with RVR CHADS2VASC score 6. Patient was noted to have heart rates in the 140s 2 nights ago. Patient in normal sinus rhythm. Felt to be secondary to patient's pulmonary  process. Patient has been seen in consultation by cardiology were adjusting patient's rate controlling medications. Patient blood pressure with some improvement. Metoprolol dose has been increased to 50 mg twice daily per cardiology. Diltiazem has been  decreased to 240 mg daily per cardiology as patient was noted to be dizzy and hypotensive 4 days ago. Patient was also given some digoxin. Per cardiology. Pulmonary  recommendeds to possibly resume anticoagulation 09/07/2014.   #4 coronary artery disease status post DES to the LAD in 2014 Stable. Continue beta blocker, statin. Plavix on hold. Per cardiology.  #5 sepsis Noted on admission as patient had a white count of 24.2, respiratory rate of 33 heart rate of 107 with source of infection being healthcare associated pneumonia. Blood cultures pending with no growth to date. Pleural fluid cultures pending. Continue empiric antibiotics.   #6 hypertension Due to hypotension, Cardizem dose was decreased back to his home regimen. Metoprolol dose has been increased slightly to 50 mg twice a day. Cozaar on hold.   #7 acute kidney injury Improvement. Stable. Follow.  #8 CLL Outpatient follow-up.  #9 prophylaxis SCDs for DVT prophylaxis.  Code Status: Full Family Communication: Updated patient, wife at bedside. Disposition Plan: Remain inpatient  Consultants:  Cardiology: Dr. Haroldine Laws 09/01/2014  CT surgery Dr. Roxan Hockey 08/28/2014  Critical care medicine Dr. Alva Garnet 08/26/2014  Procedures:  Ultrasound-guided thoracentesis 08/26/2014  CT guided drainage catheter placement into caudal aspect of right pleural space per Dr. Pascal Lux 09/01/2014  Right chest tube placement 08/27/2014  CT chest 08/30/2014, 08/25/2014  Right Chest tube removal per interventional radiology 09/04/2014  Antibiotics:  IV Fortaz 08/25/2014>>>>>>09/03/14  IV vancomycin 08/25/2014>>>> 09/01/2014  Oral levaquin 09/03/14  HPI/Subjective: Patient sitting in  chair feels better. Patient noted to have worsening shortness of breath around 6 AM and was given some IV Lasix and Solu-Medrol as well as nebulizer treatments with symptomatic improvement. Patient also noted to have a 4 second pause around 1 AM.  Objective: Filed Vitals:   09/06/14 1100  BP: 127/42  Pulse: 70  Temp:   Resp: 36    Intake/Output Summary (Last 24 hours) at 09/06/14 1203 Last data filed at 09/06/14 1100  Gross per 24 hour  Intake    680 ml  Output    550 ml  Net    130 ml   Filed Weights   09/04/14 0456 09/05/14 0429 09/06/14 0428  Weight: 76.159 kg (167 lb 14.4 oz) 77.111 kg (170 lb) 76.3 kg (168 lb 3.4 oz)    Exam:   General:  NAD  Cardiovascular: RRR  Respiratory: Decreased breath sounds in the right base, otherwise clear. Right chest tube has been removed. Pigtail catheter in place.  Abdomen: Soft, nontender, nondistended, positive bowel sounds.  Musculoskeletal: No clubbing cyanosis or edema.  Data Reviewed: Basic Metabolic Panel:  Recent Labs Lab 09/02/14 0316 09/03/14 0453 09/04/14 0343 09/05/14 0458 09/06/14 0416  NA 132* 132* 134* 132* 135  K 4.9 4.5 5.0 5.1 4.5  CL 101 103 101 98* 100*  CO2 21* 24 26 25 28   GLUCOSE 103* 107* 101* 105* 106*  BUN 29* 28* 24* 25* 26*  CREATININE 1.21 1.22 1.23 1.22 1.28*  CALCIUM 7.9* 8.0* 8.4* 8.0* 8.0*  MG  --  2.0  --   --   --    Liver Function Tests: No results for input(s): AST, ALT, ALKPHOS, BILITOT, PROT, ALBUMIN in the last 168 hours. No results for input(s): LIPASE, AMYLASE in the last 168 hours. No results for input(s): AMMONIA in the last 168 hours. CBC:  Recent Labs Lab 09/02/14 0316 09/03/14 0453 09/04/14 0343 09/05/14 0458 09/06/14 0416  WBC 16.5* 14.8* 16.9* 17.4* 18.5*  HGB 8.7* 8.5* 9.0* 8.4* 7.9*  HCT 26.6* 26.1* 27.9* 26.0* 24.4*  MCV 89.9 88.8 88.9 88.7 89.4  PLT 230 287 296 325 336   Cardiac Enzymes: No results for input(s): CKTOTAL, CKMB, CKMBINDEX, TROPONINI in  the last 168 hours. BNP (last 3 results)  Recent Labs  04/21/14 1541 08/25/14 0700  BNP 484.0* 224.5*    ProBNP (last 3 results) No results for input(s): PROBNP in the last 8760 hours.  CBG: No results for input(s): GLUCAP in the last 168 hours.  Recent Results (from the past 240 hour(s))  Culture, body fluid-bottle     Status: None (Preliminary result)   Collection Time: 09/01/14  3:45 PM  Result Value Ref Range Status   Specimen Description PLEURAL FLUID  Final   Special Requests NONE  Final   Culture NO GROWTH 4 DAYS  Final   Report Status PENDING  Incomplete  Gram stain     Status: None   Collection Time: 09/01/14  3:45 PM  Result Value Ref Range Status   Specimen Description PLEURAL FLUID  Final   Special Requests NONE  Final   Gram Stain   Final    ABUNDANT WBC PRESENT,BOTH PMN AND MONONUCLEAR NO ORGANISMS SEEN    Report Status 09/02/2014 FINAL  Final     Studies: Dg Chest Port 1 View  09/06/2014   CLINICAL DATA:  Short of breath for several weeks.  EXAM: PORTABLE CHEST - 1 VIEW  COMPARISON:  09/05/2014.  CT 08/30/2014.  FINDINGS: Cardiopericardial silhouette appears unchanged, within normal limits for projection. Aortic arch atherosclerosis. Monitoring leads project over the chest.  LEFT lung base shows atelectasis or scarring.  The RIGHT lung base shows persistent airspace disease and opacity extending to the RIGHT hemidiaphragm. RIGHT pleural effusion.  IMPRESSION: No interval change in the chest. Persistent RIGHT basilar collapse/consolidation and effusion.   Electronically Signed   By: Dereck Ligas M.D.   On: 09/06/2014 10:18   Dg Chest Port 1 View  09/05/2014   CLINICAL DATA:  Shortness of breath.  Respiratory failure.  EXAM: PORTABLE CHEST - 1 VIEW  COMPARISON:  One-view chest 09/04/2014  FINDINGS: The heart size is normal atherosclerotic changes are again seen at the aortic arch. Right basilar airspace disease is unchanged. Biapical pleural parenchymal scarring  is stable.  IMPRESSION: 1. Stable right lower lobe interstitial and airspace disease concerning for pneumonia. 2. Stable right pleural effusion. 3. Atherosclerosis.   Electronically Signed   By: San Morelle M.D.   On: 09/05/2014 08:56   Dg Chest Port 1 View  09/04/2014   CLINICAL DATA:  Status post chest tube removal. Right pleural effusion.  EXAM: PORTABLE CHEST - 1 VIEW  COMPARISON:  09/04/2014 at 7:04 a.m.  FINDINGS: No pneumothorax.  Right lung base opacity consistent with a small residual effusion and associated atelectasis, unchanged from the earlier study. No pulmonary edema. No new lung abnormalities.  Cardiac silhouette normal in size. No mediastinal or hilar masses or evidence of adenopathy.  IMPRESSION: 1. No change from the earlier study. Persistent right lung base opacity most likely combination of atelectasis and a small pleural effusion. 2. No pneumothorax.   Electronically Signed   By: Lajean Manes M.D.   On: 09/04/2014 16:35    Scheduled Meds: . atorvastatin  10 mg Oral q1800  . calcium carbonate  1 tablet Oral BID  . digoxin  0.125 mg Oral Daily  . diltiazem  240 mg Oral Daily  . feeding supplement (ENSURE ENLIVE)  237 mL Oral TID BM  .  metoprolol tartrate  50 mg Oral BID  . montelukast  10 mg Oral QHS  . polyethylene glycol  17 g Oral Daily  . senna-docusate  1 tablet Oral BID   Continuous Infusions: . sodium chloride 10 mL/hr at 09/01/14 1204    Principal Problem:   HCAP (healthcare-associated pneumonia) Active Problems:   Pleural effusion associated with pulmonary infection   CAD (coronary artery disease)   Atrial fibrillation   Chronic lymphatic leukemia   Pleural effusion on right   Sepsis   Pleural effusion   Pleural effusion, right   Blood poisoning   Chest tube in place   SOB (shortness of breath)    Time spent: 35 minutes    [facility] MD [facility] Pager [phone]. If 7PM-7AM, please contact night-coverage at [email or web address],  password [facility] 09/06/2014, 12:03 PM  LOS: 12 days

## 2014-09-07 ENCOUNTER — Inpatient Hospital Stay (HOSPITAL_COMMUNITY): Payer: Medicare Other

## 2014-09-07 LAB — CBC
HEMATOCRIT: 24.9 % — AB (ref 39.0–52.0)
Hemoglobin: 8 g/dL — ABNORMAL LOW (ref 13.0–17.0)
MCH: 28.5 pg (ref 26.0–34.0)
MCHC: 32.1 g/dL (ref 30.0–36.0)
MCV: 88.6 fL (ref 78.0–100.0)
PLATELETS: 356 10*3/uL (ref 150–400)
RBC: 2.81 MIL/uL — ABNORMAL LOW (ref 4.22–5.81)
RDW: 23 % — AB (ref 11.5–15.5)
WBC: 24.2 10*3/uL — ABNORMAL HIGH (ref 4.0–10.5)

## 2014-09-07 LAB — BASIC METABOLIC PANEL
Anion gap: 7 (ref 5–15)
BUN: 33 mg/dL — ABNORMAL HIGH (ref 6–20)
CO2: 29 mmol/L (ref 22–32)
CREATININE: 1.43 mg/dL — AB (ref 0.61–1.24)
Calcium: 8.2 mg/dL — ABNORMAL LOW (ref 8.9–10.3)
Chloride: 99 mmol/L — ABNORMAL LOW (ref 101–111)
GFR calc Af Amer: 49 mL/min — ABNORMAL LOW (ref >60)
GFR, EST NON AFRICAN AMERICAN: 42 mL/min — AB (ref >60)
Glucose, Bld: 136 mg/dL — ABNORMAL HIGH (ref 65–99)
Potassium: 4.7 mmol/L (ref 3.5–5.1)
SODIUM: 135 mmol/L (ref 135–145)

## 2014-09-07 MED ORDER — POLYETHYLENE GLYCOL 3350 17 G PO PACK
17.0000 g | PACK | Freq: Two times a day (BID) | ORAL | Status: DC
  Administered 2014-09-10 – 2014-09-12 (×4): 17 g via ORAL
  Filled 2014-09-07 (×10): qty 1

## 2014-09-07 MED ORDER — SENNOSIDES-DOCUSATE SODIUM 8.6-50 MG PO TABS
1.0000 | ORAL_TABLET | Freq: Every day | ORAL | Status: DC
  Administered 2014-09-12: 1 via ORAL
  Filled 2014-09-07 (×3): qty 1

## 2014-09-07 NOTE — Progress Notes (Signed)
Physical Therapy Treatment Patient Details Name: Johnathan Strong MRN: [id number] DOB: Sep 05, 1923 Today's Date: 09/07/2014    History of Present Illness 81 y.o. male with past history of COPD, paroxysmal A. fib on Eliquis, CAD, hypertension was recently discharged from [facility] 6/9 after treatment for community-acquired pneumonia. He presented to the ER where he was noted to have a white count of 24K, chest x-ray with worsening pneumonia and moderate right pleural effusion. Pt with chest tube.    PT Comments    Progressing well.  Still needing 1-2 L O2 during ambulation. EHR  Staying in more appropriate 70's and 80's bpm.   Follow Up Recommendations  Home health PT;Supervision for mobility/OOB     Equipment Recommendations  Rolling walker with 5" wheels    Recommendations for Other Services       Precautions / Restrictions Precautions Precautions: Fall    Mobility  Bed Mobility Overal bed mobility: Needs Assistance Bed Mobility: Supine to Sit     Supine to sit: Supervision     General bed mobility comments: mildly unsteady initially and then much more stable  Transfers Overall transfer level: Needs assistance Equipment used: Rolling walker (2 wheeled) Transfers: Sit to/from Stand Sit to Stand: Min guard Stand pivot transfers: Min guard       General transfer comment: uses bed to stabilize against.  Ambulation/Gait Ambulation/Gait assistance: Min assist;Min guard Ambulation Distance (Feet): 300 Feet (with 2 standing rest breaks) Assistive device: None;Rolling walker (2 wheeled) Gait Pattern/deviations: Step-through pattern;Scissoring Gait velocity: slower in attempt to stay steady.  Doing better with no device, but not ready to be independent as he is mildly to moderately unsteady with scanning and turns.   General Gait Details: more unsteady with no device and moderately unsteady with LOB during scanning   Stairs            Wheelchair  Mobility    Modified Rankin (Stroke Patients Only)       Balance Overall balance assessment: Needs assistance Sitting-balance support: No upper extremity supported Sitting balance-Leahy Scale: Fair     Standing balance support: No upper extremity supported Standing balance-Leahy Scale: Fair Standing balance comment: standing at sink with urinal.                    Cognition Arousal/Alertness: Awake/alert Behavior During Therapy: WFL for tasks assessed/performed Overall Cognitive Status: Within Functional Limits for tasks assessed                      Exercises General Exercises - Lower Extremity Hip ABduction/ADduction: AROM;10 reps;Both;Standing Hip Flexion/Marching: AROM;Both;10 reps;Standing    General Comments General comments (skin integrity, edema, etc.): O2 sats 91% on 1L, but dips down with ambulation needing 2L    EHR  74- low 80's      Pertinent Vitals/Pain Pain Assessment: No/denies pain    Home Living                      Prior Function            PT Goals (current goals can now be found in the care plan section) Acute Rehab PT Goals PT Goal Formulation: With patient Time For Goal Achievement: 09/07/14 Potential to Achieve Goals: Good Progress towards PT goals: Progressing toward goals    Frequency  Min 3X/week    PT Plan Current plan remains appropriate    Co-evaluation  End of Session Equipment Utilized During Treatment: Oxygen Activity Tolerance: Patient tolerated treatment well Patient left: in chair;with call bell/phone within reach;with family/visitor present     Time: 3845-3646 PT Time Calculation (min) (ACUTE ONLY): 40 min  Charges:  $Gait Training: 23-37 mins $Therapeutic Exercise: 8-22 mins                    G Codes:      Jonah Nestle, Tessie Fass 09/07/2014, 10:50 AM  09/07/2014  Donnella Sham, PT [phone]  (pager)

## 2014-09-07 NOTE — Care Management Note (Signed)
Case Management Note  Patient Details  Name: RONI SCOW MRN: [id number] Date of Birth: May 03, 1923  Subjective/Objective:                 He presented to the ER where he was noted to have a white count of 24K, chest x-ray with worsening pneumonia and moderate right pleural effusion.    Action/Plan: To be discharged home with home health  Expected Discharge Date:                  Expected Discharge Plan:  [place]  In-House Referral:     Discharge planning Services  CM Consult  Post Acute Care Choice:    Choice offered to:  Patient, Spouse, Adult Children  DME Arranged:  Walker rolling DME Agency:  [place] Arranged:  PT, Nurse's Aide [place] Agency:  [place]  Status of Service:  In process, will continue to follow  Medicare Important Message Given:  Yes-fourth notification given Date Medicare IM Given:  08/28/14 Medicare IM give by:  Jacqlyn Krauss, RN,BSN  Date Additional Medicare IM Given:    Additional Medicare Important Message give by:     If discussed at [place] of Stay Meetings, dates discussed:    Additional Comments:  Dimas Aguas, RN 09/07/2014, 3:44 PM

## 2014-09-07 NOTE — Progress Notes (Signed)
CM spoke with patient , wife and adult son at bedside to arrange for HHPT and DME: [place].  Patient lives with wife who is 79 yo.  Says his wife will be providing support and supervision but states he will need assistance with bathing, bed making, help to get to toilet until he is stronger for about 4 hours few times weekly.  CM educates patient about his Nurse Aid benefit and patient wanted to use it if available.  Patient and wife agreed to [facility].  CM called Lilyan Gilford of [facility] at [phone]  to set up [place] and Nurse Aid.  CM called Jermaine of [facility] at [phone] to arrange DME.  Patient says he will need someone to help him and his wife cook since both live alone and their closest child lives in [place], [place] paged Butch Penny, [place] at [phone] if there is any program.  Confirmed patient will have to use private duty and pay out of pocket.  Patient and wife says they are looking at [facility] at [place] [phone].  CM called agency for updates but got answering service and says the office is closed for holiday and will re-open tomorrow at [place] is available for follow up questions and assistance as needed.

## 2014-09-07 NOTE — Progress Notes (Signed)
Patient Name: Johnathan Strong Date of Encounter: 09/07/2014     Principal Problem:   HCAP (healthcare-associated pneumonia) Active Problems:   CAD (coronary artery disease)   Atrial fibrillation   Chronic lymphatic leukemia   Pleural effusion on right   Sepsis   Pleural effusion   Pleural effusion, right   Blood poisoning   Chest tube in place   Pleural effusion associated with pulmonary infection   SOB (shortness of breath)   Essential hypertension    SUBJECTIVE  The patient feels better.  He is not coughing up any sputum.  He is less short of breath.  He has remained in normal sinus rhythm.  He had a 2 second pause during the night.  Anticoagulation with Apixaban currently on hold.  CURRENT MEDS . atorvastatin  10 mg Oral q1800  . calcium carbonate  1 tablet Oral BID  . digoxin  0.125 mg Oral Daily  . diltiazem  240 mg Oral Daily  . feeding supplement (ENSURE ENLIVE)  237 mL Oral TID BM  . metoprolol tartrate  50 mg Oral BID  . montelukast  10 mg Oral QHS  . polyethylene glycol  17 g Oral Daily  . senna-docusate  1 tablet Oral BID    OBJECTIVE  Filed Vitals:   09/06/14 2012 09/06/14 2350 09/07/14 0350 09/07/14 0430  BP: 146/53 138/51 129/48   Pulse: 80 62 61   Temp: 98.2 F (36.8 C) 97.8 F (36.6 C) 97.8 F (36.6 C)   TempSrc: Oral Oral Oral   Resp: 23 21 13    Height:      Weight:    158 lb 15.2 oz (72.1 kg)  SpO2: 100% 98% 98%     Intake/Output Summary (Last 24 hours) at 09/07/14 0824 Last data filed at 09/06/14 2012  Gross per 24 hour  Intake    960 ml  Output    320 ml  Net    640 ml   Filed Weights   09/05/14 0429 09/06/14 0428 09/07/14 0430  Weight: 170 lb (77.111 kg) 168 lb 3.4 oz (76.3 kg) 158 lb 15.2 oz (72.1 kg)    PHYSICAL EXAM  General: Pleasant, NAD.  Nasal oxygen in place Neuro: Alert and oriented X 3. Moves all extremities spontaneously. Psych: Normal affect. HEENT:  Normal  Neck: Supple without bruits or JVD. Lungs:  Absent  breath sounds right base.  Otherwise generally distant breath sounds consistent with COPD Heart: RRR no s3, s4, or murmurs. Abdomen: Soft, non-tender, non-distended, BS + x 4.  Extremities: No clubbing, cyanosis or edema. DP/PT/Radials 2+ and equal bilaterally.  Accessory Clinical Findings  CBC  Recent Labs  09/06/14 0416 09/07/14 0446  WBC 18.5* 24.2*  HGB 7.9* 8.0*  HCT 24.4* 24.9*  MCV 89.4 88.6  PLT 336 629   Basic Metabolic Panel  Recent Labs  09/06/14 0416 09/07/14 0446  NA 135 135  K 4.5 4.7  CL 100* 99*  CO2 28 29  GLUCOSE 106* 136*  BUN 26* 33*  CREATININE 1.28* 1.43*  CALCIUM 8.0* 8.2*   Liver Function Tests No results for input(s): AST, ALT, ALKPHOS, BILITOT, PROT, ALBUMIN in the last 72 hours. No results for input(s): LIPASE, AMYLASE in the last 72 hours. Cardiac Enzymes No results for input(s): CKTOTAL, CKMB, CKMBINDEX, TROPONINI in the last 72 hours. BNP Invalid input(s): POCBNP D-Dimer No results for input(s): DDIMER in the last 72 hours. Hemoglobin A1C No results for input(s): HGBA1C in the last 72 hours. Fasting  Lipid Panel No results for input(s): CHOL, HDL, LDLCALC, TRIG, CHOLHDL, LDLDIRECT in the last 72 hours. Thyroid Function Tests No results for input(s): TSH, T4TOTAL, T3FREE, THYROIDAB in the last 72 hours.  Invalid input(s): FREET3  TELE  Normal sinus rhythm  ECG    Radiology/Studies  Dg Chest 1 View  08/26/2014   CLINICAL DATA:  Post right thoracentesis, pleural effusion.  EXAM: CHEST  1 VIEW  COMPARISON:  08/25/2014  FINDINGS: Slight decreased size of the right effusion which remains moderate. No visible pneumothorax. Continued right lower lobe atelectasis or infiltrate. Left lung is clear. Heart is normal size. No acute bony abnormality.  IMPRESSION: No pneumothorax following thoracentesis. Continued moderate right effusion with right lower lobe atelectasis or infiltrate.   Electronically Signed   By: Rolm Baptise M.D.   On:  08/26/2014 11:42   Dg Chest 2 View  08/31/2014   CLINICAL DATA:  Right pleural effusion with chest tube drainage, shortness of breath.  EXAM: CHEST  2 VIEW  COMPARISON:  Chest x-ray of June Jul 29, 2014 and chest CT scan of August 30, 2014.  FINDINGS: On the right there is mild volume loss and a moderate-sized pleural effusion. The right chest tube tip projects over the posterior aspect of the seventh rib and is unchanged. There is atelectasis or pneumonia at the right lung base which is stable. The left lung is well-expanded. There is a stable approximately 11 mm calcified nodule in the left lower lobe. There is no mediastinal shift. The cardiac silhouette is mildly enlarged. The pulmonary vascularity is normal. The bony thorax exhibits no acute abnormality.  IMPRESSION: Persistent moderate-sized inferior right pleural effusion. The tip of the right chest tube lies along the superior aspect of the fluid collection similar to the positioning described on the CT scan.  Mild stable enlargement of the cardiac silhouette without pulmonary edema.  Evidence of previous granulomatous infection.   Electronically Signed   By: David  Martinique M.D.   On: 08/31/2014 09:19   Dg Chest 2 View  08/25/2014   CLINICAL DATA:  81 year old male with shortness of breath and chest pain since last night. Pain radiating to the right shoulder with chills. Initial encounter. Recent diagnosis of pneumonia.  EXAM: CHEST  2 VIEW  COMPARISON:  08/13/2014 and earlier.  FINDINGS: Seated upright AP and lateral views of the chest. Continued moderate size right pleural effusion with dense right lung base opacification. Lower lung volumes. No pneumothorax or pulmonary edema. Stable cardiac size and mediastinal contours. The left lung remains clear aside from evidence of a chronic lower lobe pulmonary nodule which has not significantly changed since 2013. Calcified atherosclerosis of the aorta again noted. No acute osseous abnormality identified.   IMPRESSION: 1. Continued moderate right pleural effusion with associated right lung base collapse/consolidation. 2. No new cardiopulmonary abnormality. Chronic left lower lobe lung nodule.   Electronically Signed   By: Genevie Ann M.D.   On: 08/25/2014 08:17   Dg Chest 2 View  08/13/2014   CLINICAL DATA:  Shortness of breath weakness pneumonia  EXAM: CHEST  2 VIEW  COMPARISON:  08/11/2014  FINDINGS: Moderate right effusion with underlying consolidation stable. Heart size and vascular pattern normal. No evidence of infiltrate on the left. Dense circumscribed oval 1 cm nodule to the left of the descending thoracic aorta stable from 02/26/2012 and therefore considered to be benign. This projects over the spine on the lateral radiograph and it is likely a granuloma given its density and  stability.  IMPRESSION: No change from prior study. Moderate right effusion and lower lobe consolidation.   Electronically Signed   By: Skipper Cliche M.D.   On: 08/13/2014 14:08   Dg Chest 2 View  08/11/2014   CLINICAL DATA:  Chest pain  EXAM: CHEST  2 VIEW  COMPARISON:  08/10/2014  FINDINGS: Heart is normal size. Consolidation in the right lower lobe concerning for pneumonia. Moderate right pleural effusion. Left lung is clear. No acute bony abnormality.  IMPRESSION: Continued right lower lobe consolidation and moderate right effusion, stable or slightly worsened since prior study.   Electronically Signed   By: Rolm Baptise M.D.   On: 08/11/2014 15:26   Dg Chest 2 View  08/10/2014   CLINICAL DATA:  Pneumonia, shortness of breath.  EXAM: CHEST  2 VIEW  COMPARISON:  PA and lateral chest x-ray of August 09, 2014  FINDINGS: The left lung is well-expanded. On the right there is persistent pleural fluid and increased basilar parenchymal density. The heart and pulmonary vascularity are normal. The mediastinum is normal in width. There is calcification in the wall of the thoracic aorta. There is mild gaseous distention of bowel under the left  hemidiaphragm. The bony thorax exhibits no acute abnormality.  IMPRESSION: Persistent right lower lobe atelectasis or pneumonia and small right pleural effusion. There is likely underlying COPD. There has been interval development of minimal left basilar subsegmental atelectasis.   Electronically Signed   By: David  Martinique M.D.   On: 08/10/2014 07:53   Ct Chest Wo Contrast  08/30/2014   CLINICAL DATA:  Right pleural effusion and right-sided chest tube. Patient receiving intrapleural tPA.  EXAM: CT CHEST WITHOUT CONTRAST  TECHNIQUE: Multidetector CT imaging of the chest was performed following the standard protocol without IV contrast.  COMPARISON:  CT 08/25/2014 and chest x-ray 08/29/2014  FINDINGS: Right anterior lateral chest tube courses into the posterior pleural space at the level of the mid lungs as tip is within the most superior aspect of the pleural fluid collection. Minimal subcutaneous emphysema over the right flank. There is a moderate size right pleural effusion with mild interval improvement. There is continued compressive atelectasis over the right lower lobe and right middle lobes. Small collection of air within the pleural space over the anterior aspect of the pleural effusion near the entry site at the chest tube. Calcified granuloma over the left lower lobe. Minimal scarring over the posterior left lower lobe. Airways are within normal.  Mild stable cardiomegaly. Calcification of the mitral valve annulus. Moderate calcification of the left main and 3 vessel coronary arteries unchanged. Coronary stents present. Tiny amount of pericardial fluid present. Mild-to-moderate calcified plaque over the thoracic aorta. No mediastinal, hilar or axillary adenopathy. Stable sub cm lymph node over the right retrocrural region at the level of the diaphragm.  Images through the upper abdomen are unchanged. There are degenerative changes of the spine.  IMPRESSION: Interval improvement in a moderate size right  pleural fluid collection with associated compressive atelectasis over the right middle and lower lobes. Right-sided chest tube in with tip over the posterior pleural space in the most superior aspect of the pleural fluid collection.  Prior granulomas disease.  Atherosclerotic coronary artery disease with coronary stents present. Mild calcification of the mitral valve annulus. Mild cardiomegaly.   Electronically Signed   By: Marin Olp M.D.   On: 08/30/2014 09:31   Ct Chest W Contrast  08/25/2014   CLINICAL DATA:  Pleurisy and increase shortness  of breath  EXAM: CT CHEST WITH CONTRAST  TECHNIQUE: Multidetector CT imaging of the chest was performed during intravenous contrast administration.  CONTRAST:  23mL OMNIPAQUE IOHEXOL 300 MG/ML  SOLN  COMPARISON:  None.  FINDINGS: THORACIC INLET/BODY WALL:  No acute abnormality.  MEDIASTINUM:  Normal heart size. No pericardial effusion. Extensive coronary atherosclerosis. No acute vascular abnormality. No adenopathy.  LUNG WINDOWS:  Large right pleural effusion which is water density. The fluid is predominantly free-flowing based on comparison to previous chest x-ray, but there is some complex features with scalloping of the lung margins. The right lower lobe and lateral segment right middle lobe are collapsed. There is no evidence of pleural nodularity or lung mass. No pneumonia is suspected.  Calcified granuloma in the left lower lobe.  UPPER ABDOMEN:  Partly visualized left renal cyst, imaged portions stable from 09/29/2008 abdominal CT  OSSEOUS:  No acute fracture.  No suspicious lytic or blastic lesions.  IMPRESSION: Large right pleural effusion with lower lobe and middle lobe segmental collapse. The effusion is predominantly free-flowing based on prior chest x-ray, but there may be partial loculation where the lung is scalloped . No pleural or pulmonary cause is identified.   Electronically Signed   By: Monte Fantasia M.D.   On: 08/25/2014 15:09   Dg Chest Port  1 View  09/06/2014   CLINICAL DATA:  Short of breath for several weeks.  EXAM: PORTABLE CHEST - 1 VIEW  COMPARISON:  09/05/2014.  CT 08/30/2014.  FINDINGS: Cardiopericardial silhouette appears unchanged, within normal limits for projection. Aortic arch atherosclerosis. Monitoring leads project over the chest.  LEFT lung base shows atelectasis or scarring.  The RIGHT lung base shows persistent airspace disease and opacity extending to the RIGHT hemidiaphragm. RIGHT pleural effusion.  IMPRESSION: No interval change in the chest. Persistent RIGHT basilar collapse/consolidation and effusion.   Electronically Signed   By: Dereck Ligas M.D.   On: 09/06/2014 10:18   Dg Chest Port 1 View  09/05/2014   CLINICAL DATA:  Shortness of breath.  Respiratory failure.  EXAM: PORTABLE CHEST - 1 VIEW  COMPARISON:  One-view chest 09/04/2014  FINDINGS: The heart size is normal atherosclerotic changes are again seen at the aortic arch. Right basilar airspace disease is unchanged. Biapical pleural parenchymal scarring is stable.  IMPRESSION: 1. Stable right lower lobe interstitial and airspace disease concerning for pneumonia. 2. Stable right pleural effusion. 3. Atherosclerosis.   Electronically Signed   By: San Morelle M.D.   On: 09/05/2014 08:56   Dg Chest Port 1 View  09/04/2014   CLINICAL DATA:  Status post chest tube removal. Right pleural effusion.  EXAM: PORTABLE CHEST - 1 VIEW  COMPARISON:  09/04/2014 at 7:04 a.m.  FINDINGS: No pneumothorax.  Right lung base opacity consistent with a small residual effusion and associated atelectasis, unchanged from the earlier study. No pulmonary edema. No new lung abnormalities.  Cardiac silhouette normal in size. No mediastinal or hilar masses or evidence of adenopathy.  IMPRESSION: 1. No change from the earlier study. Persistent right lung base opacity most likely combination of atelectasis and a small pleural effusion. 2. No pneumothorax.   Electronically Signed   By: Lajean Manes M.D.   On: 09/04/2014 16:35   Dg Chest Port 1 View  09/04/2014   CLINICAL DATA:  Followup respiratory failure.  EXAM: PORTABLE CHEST - 1 VIEW  COMPARISON:  09/03/2014  FINDINGS: Persistent right lung base opacity is noted consistent with a combination of pleural fluid  and atelectasis. There is minor persistent subsegmental atelectasis at the left lung base. No pulmonary edema. No pneumothorax.  Cardiopericardial silhouette is normal in size. No mediastinal or hilar masses.  IMPRESSION: 1. No change from the previous day's study. 2. Persistent right basilar opacity most likely combination of atelectasis and pleural fluid. 3. No pneumothorax.   Electronically Signed   By: Lajean Manes M.D.   On: 09/04/2014 08:13   Dg Chest Port 1 View  09/03/2014   CLINICAL DATA:  Respiratory failure, right-sided chest tube  EXAM: PORTABLE CHEST - 1 VIEW  COMPARISON:  Portable chest x-ray of September 02, 2014  FINDINGS: The large caliber right chest tube has been removed. The pigtail catheter remains in place with the pigtail located in the medial costophrenic gutter. There is stable increased density at the right lung base consistent with pleural fluid and/or pleural thickening. There is no pneumothorax. The left lung is clear. The heart and mediastinal structures are normal. The observed bony thorax is unremarkable.  IMPRESSION: Interval removal of the large caliber chest tube with stable positioning of the small caliber tube. There is stable increased density at the right lung base consistent with pleural fluid or thickening and likely atelectasis.   Electronically Signed   By: David  Martinique M.D.   On: 09/03/2014 07:27   Dg Chest Port 1 View  09/02/2014   CLINICAL DATA:  Chest tube treatment of right pleural effusion  EXAM: PORTABLE CHEST - 1 VIEW  COMPARISON:  Chest x-ray of August 31, 2014  FINDINGS: The large caliber chest tube tip on the right projects over the posterior aspect of the seventh rib. The new small  caliber tube has its pigtail overlying the medial costovertebral angle on the right. There is persistent right lower lobe atelectasis. The left lung is clear. There is no mediastinal shift. There is no pneumothorax. A small amount of pleural fluid likely remains on the right. The heart is normal in size. The pulmonary vascularity is mildly prominent centrally. The bony thorax exhibits no acute abnormality.  IMPRESSION: Stable appearance of the right hemithorax with the presence of 2 chest tubes. There remains parenchymal consolidation as well as small amount of pleural fluid on the right.   Electronically Signed   By: David  Martinique M.D.   On: 09/02/2014 07:41   Dg Chest Port 1 View  08/29/2014   CLINICAL DATA:  Evaluate pleural effusions  EXAM: PORTABLE CHEST - 1 VIEW  COMPARISON:  08/28/2014  FINDINGS: Right chest tube in place. No pneumothorax identified. The right-sided pleural effusion is unchanged in volume from previous exam. There is atelectasis in the right base. Gas is noted within the soft tissues of the right chest wall surrounding the chest tube.  IMPRESSION: 1. No change in right pleural effusion.   Electronically Signed   By: Kerby Moors M.D.   On: 08/29/2014 13:12   Dg Chest Port 1 View  08/28/2014   CLINICAL DATA:  Chest tube placement for effusion.  EXAM: PORTABLE CHEST - 1 VIEW  COMPARISON:  Study obtained earlier in the day  FINDINGS: There is a chest tube on the right without demonstrable pneumothorax. Right effusion is smaller. There is a fairly small right effusion with right base consolidation and patchy atelectasis. Lungs elsewhere clear. There is no appreciable interstitial edema. The heart size and pulmonary vascularity are normal. There is atherosclerotic change in the aorta. No adenopathy. No bone lesions.  IMPRESSION: Chest tube in place on the right. No  pneumothorax. Note that there is soft tissue air tracking along the chest tube in the lateral right hemithorax inferiorly.  Right pleural effusion is smaller compared to earlier in the day. There is a residual right effusion with right base consolidation and patchy atelectasis.  There is no evidence of pulmonary edema. No change in cardiac silhouette.   Electronically Signed   By: Lowella Grip III M.D.   On: 08/28/2014 15:19   Dg Chest Port 1 View  08/28/2014   CLINICAL DATA:  Pleural effusion.  Pulmonary infection.  EXAM: PORTABLE CHEST - 1 VIEW  COMPARISON:  08/27/2014.  FINDINGS: Right chest tube in stable position. Mediastinum and hilar structures are normal. Cardiomegaly with normal pulmonary vascularity. Right lower lobe infiltrate consistent with pneumonia. Right pleural effusion. Right chest wall subcutaneous emphysema again noted.  IMPRESSION: 1.  Right chest tube in stable position.  No pneumothorax.  2. Persistent dense right lower lobe infiltrate and right pleural effusion. No interim change.   Electronically Signed   By: Marcello Moores  Register   On: 08/28/2014 08:15   Dg Chest Port 1 View  08/27/2014   CLINICAL DATA:  Chest tube placement.  EXAM: PORTABLE CHEST - 1 VIEW  COMPARISON:  Single view of the chest 08/26/2014.  FINDINGS: A new right chest tube is in place. Right pleural effusion and basilar airspace disease are again seen. No pneumothorax is identified. The left lung appears clear. Heart size is normal.  IMPRESSION: Status post right chest tube placement. Right pleural effusion basilar airspace disease do not appear notably changed. No pneumothorax is seen.   Electronically Signed   By: Inge Rise M.D.   On: 08/27/2014 18:30   Dg Swallowing Func-speech Pathology  09/01/2014    Objective Swallowing Evaluation:    Patient Details  Name: AFTON MIKELSON MRN: [id number] Date of Birth: [birth year]  Today's Date: 09/01/2014 Time: SLP Start Time (ACUTE ONLY): 1245-SLP Stop Time (ACUTE ONLY): 1320 SLP Time Calculation (min) (ACUTE ONLY): 35 min  Past Medical History:  Past Medical History  Diagnosis Date  .  Osteopenia 2006  . Hyperlipidemia   . BPH (benign prostatic hypertrophy)   . Transient global amnesia   . Osteoarthritis   . History of TIA (transient ischemic attack) 1980'S    NO RESIDUAL  . COPD (chronic obstructive pulmonary disease) with emphysema   . Chronic back pain   . Itching SECONDARY TO CLL-- CONTROLLED W/ SINGULAIR  . Frequency   . Nocturia   . Hypertension CARDIOLOGIST- DR Aidin Doane-- LAST VISIT 12-15-2010 NOTE IN  EPIC  . CLL (chronic lymphocytic leukemia) LAST PLT COUNT APRL 2012  168    Managed at [facility]  . Bladder cancer   . Aspirin allergy     Eyes swell  . Asthma   . Dyspnea on exertion   . CAD (coronary artery disease)     a. 08/2012 Cath: LM nl, LAD 90p, LCX 20-30, RCA 100ost fills via L->R  collats, EF 55-65%;  b. 09/2012 PCI of prox LAD with 3.0x16 Promus DES.   Past Surgical History:  Past Surgical History  Procedure Laterality Date  . Excisional hemorrhoidectomy  1980  . Cardiovascular stress test  07/2005    a. 07/2005- no reversible ischemia, normal EF b. 06/2009- EF 73%, no  reversible ischemia, inferolateral TWIs at rest, upright in recovery c.  08/2012- mediuim-sized partially reversible basal to mid inferior and  inferoseptal perfusion defect c/w with prior infarction and peri-infarct  ischemia; EF 68% and borderline ST/T changes on stress ECG   . Transurethral resection of bladder tumor  10-19-2008    AND DILATION URETHRAL STRICTURE  . Cataract extraction w/ intraocular lens  implant, bilateral Bilateral ~  2007  . Transthoracic echocardiogram  01-03-2011    LVEF 88-41%, grade 1 diastolic dysfunction, no WMAs or structural  abnormalities  . Cystoscopy with biopsy  10/30/2011    Procedure: CYSTOSCOPY WITH BIOPSY;  Surgeon: Claybon Jabs, MD;   Location: [facility];  Service: Urology;  Laterality: N/A;   . Cardiac catheterization  08/27/2012  . Coronary angioplasty with stent placement  09/26/2012    "1" (09/26/2012)  . Coronary stent placement  09/26/12  .  Left heart catheterization with coronary angiogram N/A 08/27/2012    Procedure: LEFT HEART CATHETERIZATION WITH CORONARY ANGIOGRAM;  Surgeon:  Peter M Martinique, MD;  Location: [facility] CATH LAB;  Service: Cardiovascular;   Laterality: N/A;  . Percutaneous coronary stent intervention (pci-s) N/A 09/26/2012    Procedure: PERCUTANEOUS CORONARY STENT INTERVENTION (PCI-S);  Surgeon:  Peter M Martinique, MD;  Location: [facility] CATH LAB;  Service: Cardiovascular;   Laterality: N/A;   HPI:  Other Pertinent Information: 81 y.o. male with past history of COPD,  paroxysmal A. fib on Eliquis, CAD, hypertension was recently discharged  from [facility] 6/9 after treatment for community-acquired  pneumonia. He presented to the ER where he was noted to have a white count  of 24K, chest x-ray with worsening pneumonia and moderate right pleural  effusion. Pt with chest tube.Previous admission for RLL PNA  with bedside  swallow completed 6/9: notes WFL oropharyngeal swallow with suspected  esophageal component.   No Data Recorded  Assessment / Plan / Recommendation CHL IP CLINICAL IMPRESSIONS 09/01/2014  Therapy Diagnosis Mild oral phase dysphagia;Suspected primary esophageal  dysphagia  Clinical Impression Pt demonstrates very mild oral dysphagia warranting  basic precautions to reduce risk of aspiration. The pt has trace premature  spillage and trace oral residuals after the swallow, both of which spill  to the valleculae and pyriforms prior to the swallow with 1-2 instances of  trace penetration, expelled with a swallow. Oropharyngeal function is  otherwise WNL. An esophageal sweep does reveal stasis throughout the  distal to mid esophagus with upward movement of residual. The barium  tablet remained at the GE junction at the end of the test. Pt is  recommended to follow precautions to reduce risk of aspiration including  upright positioning, a second swallow and staying upright 30-60 minutes  after meal. Despite the precautions the pts  highest risk is aspiration of  esophageal stasis after meals. Suggest f/u esophageal testing. SLP will  f/u for reinforcement of precautions.       CHL IP TREATMENT RECOMMENDATION 09/01/2014  Treatment Recommendations Therapy as outlined in treatment plan below     CHL IP DIET RECOMMENDATION 09/01/2014  SLP Diet Recommendations Age appropriate regular solids;Thin  Liquid Administration via (None)  Medication Administration Whole meds with liquid  Compensations Small sips/bites  Postural Changes and/or Swallow Maneuvers (None)     CHL IP OTHER RECOMMENDATIONS 09/01/2014  Recommended Consults Consider esophageal assessment;Consider GI evaluation   Oral Care Recommendations Oral care BID  Other Recommendations (None)     No flowsheet data found.   CHL IP FREQUENCY AND DURATION 09/01/2014  Speech Therapy Frequency (ACUTE ONLY) min 2x/week  Treatment Duration 1 week       No flowsheet data found.  No flowsheet  data found.        Herbie [place], [place] CCC-SLP [phone]  DeBlois, Katherene Ponto 09/01/2014, 2:57 PM    Ct Image Guided Drainage By Percutaneous Catheter  09/01/2014   INDICATION: History of community acquired pneumonia, post right-sided chest tube placement with residual loculated fluid seen within the caudal aspect of the right pleural space. Request made for placement of additional CT-guided percutaneous chest tube for infection source control purposes.  EXAM: CT IMAGE GUIDED DRAINAGE BY PERCUTANEOUS CATHETER  COMPARISON:  Chest CT- 08/10/2014; 08/25/2014; chest radiograph - 08/31/2014; ultrasound-guided right-sided thoracentesis - 08/26/2014  MEDICATIONS: The patient is currently admitted to the hospital and receiving intravenous antibiotics. The antibiotics were administered within an appropriate time frame prior to the initiation of the procedure.  ANESTHESIA/SEDATION: Fentanyl 25 mcg IV; Versed 5 mg IV  Total Moderate Sedation time  20 minutes  CONTRAST:  None  COMPLICATIONS: None immediate  PROCEDURE:  Informed written consent was obtained from the patient after a discussion of the risks, benefits and alternatives to treatment. The patient was placed right lateral decubitus on the CT gantry and a pre procedural CT was performed re-demonstrating the known loculated fluid collection within the right pleural space. The procedure was planned. A timeout was performed prior to the initiation of the procedure.  The right inferior posterior lateral chest wall was prepped and draped in the usual sterile fashion. The overlying soft tissues were anesthetized with 1% lidocaine with epinephrine. Appropriate trajectory was planned with the use of a 22 gauge spinal needle. An 18 gauge trocar needle was advanced into the right pleural space and a short Amplatz super stiff wire was coiled within the collection. Appropriate positioning was confirmed with a limited CT scan. The tract was serially dilated allowing placement of a 12 [place] all-purpose drainage catheter. Appropriate positioning was confirmed with a limited postprocedural CT scan demonstrating the and of the percutaneous drainage catheter coiled and locked within the posterior medial aspect of the right pleural space (series 7).  Approximately 50 cc blood tinged pleural fluid was aspirated. The tube was connected to a pleural vac device and sutured in place. A dressing was placed. The patient tolerated the procedure well without immediate post procedural complication.  IMPRESSION: Successful CT guided placement of a 73 French all purpose drain catheter into the caudal aspect of the right pleural space with aspiration of approximately 50 mL of blood tinged pleural fluid. Samples were sent to the laboratory as requested by the ordering clinical team.   Electronically Signed   By: Sandi Mariscal M.D.   On: 09/01/2014 16:50   US Thoracentesis Asp Pleural Space W/img Guide  08/26/2014   INDICATION: Symptomatic R sided pleural effusion  EXAM: US THORACENTESIS ASP PLEURAL  SPACE W/IMG GUIDE  COMPARISON:  None.  MEDICATIONS: 10 cc 1% lidocaine  COMPLICATIONS: None immediate  TECHNIQUE: Informed written consent was obtained from the patient after a discussion of the risks, benefits and alternatives to treatment. A timeout was performed prior to the initiation of the procedure.  Initial ultrasound scanning demonstrates a R pleural effusion. The lower chest was prepped and draped in the usual sterile fashion. 1% lidocaine was used for local anesthesia.  Under direct ultrasound guidance, a 19 gauge, 7-cm, Yueh catheter was introduced. An ultrasound image was saved for documentation purposes. The thoracentesis was performed. The catheter was removed and a dressing was applied. The patient tolerated the procedure well without immediate post procedural complication. The patient was escorted to have an upright  chest radiograph.  FINDINGS: A total of approximately 1 liters of bloody fluid was removed. Requested samples were sent to the laboratory.  IMPRESSION: Successful ultrasound-guided R sided thoracentesis yielding 1 liters of pleural fluid.  Read by:; Lavonia Drafts [facility]   Electronically Signed   By: Corrie Mckusick D.O.   On: 08/26/2014 15:53    ASSESSMENT AND PLAN 1. Recurrent paroxysmal atrial fibrillation/flutter with RVR in the setting of severe lung dx - CHADS2VASC score 6 (hypertension, age above 66, history of TIA, and vascular disease with prior coronary artery stent) Currently holding normal sinus rhythm.  Apixaban on hold.  2. HCAP: management per IM  3. Parapneumonic effusion s/p chest tube: Chest x-ray yesterday shows stable right pleural effusion.  4. CAD s/p DES to LAD in 2014, also noted to have ostial RCA occlusion with good L to R collateral - continue BB and statin, plavix on hold. Not on ASA given systemic anticoagulation need  5. COPD 6. CLL followed at [facility] 7.  Anemia, stable  Recommend: If no further thoracentesis is  anticipated for his right pleural effusion, okay to resume Apixaban.  Signed, [name] MD

## 2014-09-07 NOTE — Progress Notes (Signed)
Occupational Therapy Treatment Patient Details Name: Johnathan Strong MRN: [id number] DOB: 27-Feb-1924 Today's Date: 09/07/2014    History of present illness 81 y.o. male with past history of COPD, paroxysmal A. fib on Eliquis, CAD, hypertension was recently discharged from [facility] 6/9 after treatment for community-acquired pneumonia. He presented to the ER where he was noted to have a white count of 24K, chest x-ray with worsening pneumonia and moderate right pleural effusion. Pt with chest tube.   OT comments  Pt is making good progress with OT.  At this time he still needs min to min guard assistance for safety and still demonstrates limited endurance and O2 dependency to keep sats above 90%.  Feel he is on target to meet updated modified independent goals, depending on LOS.  Will continue to recommend HHOT and 24 hour supervision in case he leaves in the next day.  Will continue to follow for acute care OT needs.   Follow Up Recommendations  Supervision/Assistance - 24 hour;Home health OT    Equipment Recommendations  3 in 1 bedside comode       Precautions / Restrictions Precautions Precautions: Fall Restrictions Weight Bearing Restrictions: No       Mobility Bed Mobility Overal bed mobility: Needs Assistance Bed Mobility: Supine to Sit     Supine to sit: Supervision     General bed mobility comments: Pt needing momentum and use of bed rail to sit up from supine.  Transfers Overall transfer level: Needs assistance Equipment used: 1 person hand held assist   Sit to Stand: Min assist;Min guard Stand pivot transfers: Min guard;Min assist       General transfer comment: Pt needing min assist to min guard assist for transfers and sit to stand.    Balance Overall balance assessment: Needs assistance Sitting-balance support: No upper extremity supported Sitting balance-Leahy Scale: Good       Standing balance-Leahy Scale: Fair                      ADL Overall ADL's : Needs assistance/impaired     Grooming: Wash/dry hands;Wash/dry face;Min Dispensing optician: Minimal assistance;Ambulation Toilet Transfer Details (indicate cue type and reason): No assistive device when ambulating to the bathroom.  Pt also standing with supervision to urinate. Toileting- Water quality scientist and Hygiene: Min guard       Functional mobility during ADLs: Minimal assistance General ADL Comments: Pt ambulated in the hallway with min assist, without assistive device.  Noted LOB to the right and left when walking and incorporating head turns left and right.  Oxygen sats decreasing to 89-91% on 2ls with mobility.  HR maintained at 70-75 BPM with dyspnea 2/4                Cognition   Behavior During Therapy: [facility] for tasks assessed/performed Overall Cognitive Status: Within Functional Limits for tasks assessed                                    Pertinent Vitals/ Pain       Pain Assessment: No/denies pain         Frequency Min 2X/week     Progress Toward Goals  OT Goals(current goals can now be found in the care plan section)  Progress towards OT goals:  Progressing toward goals  Acute Rehab OT Goals OT Goal Formulation: With patient Time For Goal Achievement: 09/14/14 Potential to Achieve Goals: Good ADL Goals Pt Will Perform Upper Body Bathing: with modified independence;sitting Pt Will Perform Lower Body Bathing: with modified independence;sit to/from stand Pt Will Perform Lower Body Dressing: with modified independence;sit to/from stand Pt Will Transfer to Toilet: with modified independence;ambulating;bedside commode Pt Will Perform Toileting - Clothing Manipulation and hygiene: with modified independence;sit to/from stand  Plan Discharge plan remains appropriate       End of Session Equipment Utilized During Treatment: Oxygen   Activity Tolerance Patient tolerated treatment  well   Patient Left in chair;with nursing/sitter in room;with family/visitor present   Nurse Communication Mobility status      Time: 2694-8546 OT Time Calculation (min): 21 min  Charges: OT General Charges $OT Visit: 1 Procedure OT Treatments $Self Care/Home Management : 8-22 mins  Rukia Mcgillivray OTR/L 09/07/2014, 4:28 PM

## 2014-09-07 NOTE — Progress Notes (Signed)
[facility] PROGRESS NOTE  Johnathan Strong YME:[id number] DOB: 1923-08-19 DOA: 08/25/2014 PCP: Redge Gainer, MD  Interim Summary Patient is a pleasant 81 year old gentleman with a past medical history of CML, recently discharged from the medicine service on 08/19/2014, admitted on 08/09/2014 during which he was treated for community-acquired pneumonia. Patient showed gradual improvement as he was weaned off of supplemental oxygen, tolerating by mouth intake, and was discharged on Ceftin. His wife reporting that patient did well the week after discharge, got back to his normal routine, then had a decline this past Monday, developing fevers, chills, night sweats. Chest x-ray performed on admission revealed moderate right pleural effusion with associated right lung base consolidation/collapse. He was started on broad-spectrum IV antibiotic therapy with vancomycin and ceftazidime, undergoing ultrasound-guided thoracentesis by interventional radiology on 08/26/2014. Pulmonary critical care medicine was consulted regarding pleural effusion as it was felt he would benefit chest tube. PCCM placed a 32 French chest tube on 08/27/2014 (rather than wayne cath) since there was marked fibrinous tissue and concern for loculation. Subsequent imagining showed incomplete drainage for which TPA was injected into pleural space. This seemed to help initially however subsequent injections did not yield significant fluid. PCCM recommended proceeding with IR consult for PIG-tail catheter, which was performed on 09/01/2014. Hospitalization was complicated by the moment of A. fib with RVR on 08/27/2014 likely precipitated by underlying pulmonary infectious process and acute hypoxemic respiratory failure. Controlling Afib has been a challenge. Dr Coralyn Pear consulted cardiology on 09/01/2014.   Assessment/Plan: #1 healthcare associated pneumonia with probable parapneumonic effusion Patient recently discharged on 08/19/2014 from the  hospital after being treated for community-acquired pneumonia who presented with a probable parapneumonic effusion. Patient underwent ultrasound-guided thoracentesis by interventional radiology with a liter of yellow colored fluid removed with a cell count of 10,299. Thoracentesis was consistent with exudative characteristics. No organisms seen on Gram stain. Blood cultures pending. Afebrile. WBC trending down. Patient also had chest tube placed on 08/27/2014 with 1200 mL output in all 625 and 626 minimal output. On 09/01/2014 posterior 12 French chest tube was placed per IR. 09/02/2014 patient had right lateral chest tube removed per critical care. Patient is afebrile. WBC trending down. Treatment options were discussed with patient and family including drain catheter versus repeat thoracentesis. VATS procedure was felt not to be well tolerated due to his multiple comorbidities, advanced age, A. fib with RVR. IV Tressie Ellis has been changed to oral levaquin. Pulmonary medicine gave TPA injections into pleural space due to amount of fibrous material and incomplete fluid drainage from the chest tube. Patient did have a repeat chest CT that showed interval improvement in right-sided pleural effusion. Patient was evaluated by Dr. Roxan Hockey of cardiothoracic surgery who did not recommend a VATS procedure. Patient had pigtail catheter placement done on 09/01/2014 per interventional radiology. Second chest tube was removed. Repeat chest x-ray done this morning with no interval change in the chest. Persistent right basilar collapse/consolidation and effusion. Clinical improvement. Continue current therapy. Per Pulmonary critical care.  #2 acute hypoxemic respiratory failure Likely multifactorial secondary to problem #1. Improving slowly. Patient with some shortness of breath on ambulation to the bathroom without his oxygen. Will repeat a chest x-ray. Patient may likely need to go home on oxygen.  #3 A. fib with  RVR CHADS2VASC score 6. Patient was noted to have heart rates in the 140s 3 nights ago. Patient has converted back into normal sinus rhythm. Felt to be secondary to patient's pulmonary process. Patient has been seen  in consultation by cardiology were adjusting patient's rate controlling medications. Patient blood pressure with some improvement. Metoprolol dose has been increased to 50 mg twice daily per cardiology. Diltiazem has been decreased to 240 mg daily per cardiology as patient was noted to be dizzy and hypotensive 4 days ago. Patient was also given some digoxin. Per cardiology. Pulmonary  recommendeds to possibly resume anticoagulation 09/07/2014.   #4 coronary artery disease status post DES to the LAD in 2014 Stable. Continue beta blocker, statin. Plavix on hold. Per cardiology.  #5 sepsis Noted on admission as patient had a white count of 24.2, respiratory rate of 33 heart rate of 107 with source of infection being healthcare associated pneumonia. Blood cultures pending with no growth to date. Pleural fluid cultures pending. Continue empiric antibiotics.   #6 hypertension Due to hypotension, Cardizem dose was decreased back to his home regimen. Metoprolol dose has been increased slightly to 50 mg twice a day. Cozaar on hold.   #7 acute kidney injury Improvement. Stable. Follow.  #8 leukocytosis WBC is trending back up and currently at 24.2. Patient is afebrile. Patient denies any diarrhea. Patient denies any dysuria. Will repeat chest x-ray as patient with some shortness of breath. Continue empiric antibiotics for now. Will follow.  #9 CLL Outpatient follow-up.  #10 prophylaxis SCDs for DVT prophylaxis.  Code Status: Full Family Communication: Updated patient, wife at bedside. Disposition Plan: Home with home health when medically stable.  Consultants:  Cardiology: Dr. Haroldine Laws 09/01/2014  CT surgery Dr. Roxan Hockey 08/28/2014  Critical care medicine Dr. Alva Garnet  08/26/2014  Procedures:  Ultrasound-guided thoracentesis 08/26/2014  CT guided drainage catheter placement into caudal aspect of right pleural space per Dr. Pascal Lux 09/01/2014  Right chest tube placement 08/27/2014  CT chest 08/30/2014, 08/25/2014  Right Chest tube removal per interventional radiology 09/04/2014  Antibiotics:  IV Fortaz 08/25/2014>>>>>>09/03/14  IV vancomycin 08/25/2014>>>> 09/01/2014  Oral levaquin 09/03/14  HPI/Subjective: Patient in bed with some noted shortness of breath. Patient stated just went to the restroom without his oxygen and became short of breath area patient denies any chest pain. Patient states he ambulated with physical therapy today in the hallway and did well. Patient states on the whole is feeling better. Patient denies any diarrhea.  Objective: Filed Vitals:   09/07/14 0838  BP: 160/44  Pulse: 71  Temp:   Resp:     Intake/Output Summary (Last 24 hours) at 09/07/14 1413 Last data filed at 09/06/14 2012  Gross per 24 hour  Intake    240 ml  Output    120 ml  Net    120 ml   Filed Weights   09/05/14 0429 09/06/14 0428 09/07/14 0430  Weight: 77.111 kg (170 lb) 76.3 kg (168 lb 3.4 oz) 72.1 kg (158 lb 15.2 oz)    Exam:   General:  NAD  Cardiovascular: RRR  Respiratory: Decreased breath sounds in the right base, otherwise clear. Right chest tube has been removed. Pigtail catheter in place.  Abdomen: Soft, nontender, nondistended, positive bowel sounds.  Musculoskeletal: No clubbing cyanosis or edema.  Data Reviewed: Basic Metabolic Panel:  Recent Labs Lab 09/03/14 0453 09/04/14 0343 09/05/14 0458 09/06/14 0416 09/07/14 0446  NA 132* 134* 132* 135 135  K 4.5 5.0 5.1 4.5 4.7  CL 103 101 98* 100* 99*  CO2 24 26 25 28 29   GLUCOSE 107* 101* 105* 106* 136*  BUN 28* 24* 25* 26* 33*  CREATININE 1.22 1.23 1.22 1.28* 1.43*  CALCIUM 8.0* 8.4* 8.0*  8.0* 8.2*  MG 2.0  --   --   --   --    Liver Function Tests: No results  for input(s): AST, ALT, ALKPHOS, BILITOT, PROT, ALBUMIN in the last 168 hours. No results for input(s): LIPASE, AMYLASE in the last 168 hours. No results for input(s): AMMONIA in the last 168 hours. CBC:  Recent Labs Lab 09/03/14 0453 09/04/14 0343 09/05/14 0458 09/06/14 0416 09/07/14 0446  WBC 14.8* 16.9* 17.4* 18.5* 24.2*  HGB 8.5* 9.0* 8.4* 7.9* 8.0*  HCT 26.1* 27.9* 26.0* 24.4* 24.9*  MCV 88.8 88.9 88.7 89.4 88.6  PLT 287 296 325 336 356   Cardiac Enzymes: No results for input(s): CKTOTAL, CKMB, CKMBINDEX, TROPONINI in the last 168 hours. BNP (last 3 results)  Recent Labs  04/21/14 1541 08/25/14 0700  BNP 484.0* 224.5*    ProBNP (last 3 results) No results for input(s): PROBNP in the last 8760 hours.  CBG: No results for input(s): GLUCAP in the last 168 hours.  Recent Results (from the past 240 hour(s))  Culture, body fluid-bottle     Status: None   Collection Time: 09/01/14  3:45 PM  Result Value Ref Range Status   Specimen Description PLEURAL FLUID  Final   Special Requests NONE  Final   Culture NO GROWTH 5 DAYS  Final   Report Status 09/06/2014 FINAL  Final  Gram stain     Status: None   Collection Time: 09/01/14  3:45 PM  Result Value Ref Range Status   Specimen Description PLEURAL FLUID  Final   Special Requests NONE  Final   Gram Stain   Final    ABUNDANT WBC PRESENT,BOTH PMN AND MONONUCLEAR NO ORGANISMS SEEN    Report Status 09/02/2014 FINAL  Final     Studies: Dg Chest Port 1 View  09/06/2014   CLINICAL DATA:  Short of breath for several weeks.  EXAM: PORTABLE CHEST - 1 VIEW  COMPARISON:  09/05/2014.  CT 08/30/2014.  FINDINGS: Cardiopericardial silhouette appears unchanged, within normal limits for projection. Aortic arch atherosclerosis. Monitoring leads project over the chest.  LEFT lung base shows atelectasis or scarring.  The RIGHT lung base shows persistent airspace disease and opacity extending to the RIGHT hemidiaphragm. RIGHT pleural  effusion.  IMPRESSION: No interval change in the chest. Persistent RIGHT basilar collapse/consolidation and effusion.   Electronically Signed   By: Dereck Ligas M.D.   On: 09/06/2014 10:18    Scheduled Meds: . atorvastatin  10 mg Oral q1800  . calcium carbonate  1 tablet Oral BID  . digoxin  0.125 mg Oral Daily  . diltiazem  240 mg Oral Daily  . feeding supplement (ENSURE ENLIVE)  237 mL Oral TID BM  . metoprolol tartrate  50 mg Oral BID  . montelukast  10 mg Oral QHS  . polyethylene glycol  17 g Oral Daily  . senna-docusate  1 tablet Oral BID   Continuous Infusions: . sodium chloride 10 mL/hr at 09/01/14 1204    Principal Problem:   HCAP (healthcare-associated pneumonia) Active Problems:   Pleural effusion associated with pulmonary infection   CAD (coronary artery disease)   Atrial fibrillation   Chronic lymphatic leukemia   Pleural effusion on right   Sepsis   Pleural effusion   Pleural effusion, right   Blood poisoning   Chest tube in place   SOB (shortness of breath)   Essential hypertension    Time spent: 35 minutes    [facility] MD [facility] Pager [phone].  If 7PM-7AM, please contact night-coverage at [email or web address], password [facility] 09/07/2014, 2:13 PM  LOS: 13 days

## 2014-09-08 ENCOUNTER — Inpatient Hospital Stay (HOSPITAL_COMMUNITY): Payer: Medicare Other

## 2014-09-08 DIAGNOSIS — J9690 Respiratory failure, unspecified, unspecified whether with hypoxia or hypercapnia: Secondary | ICD-10-CM | POA: Insufficient documentation

## 2014-09-08 DIAGNOSIS — J9601 Acute respiratory failure with hypoxia: Secondary | ICD-10-CM

## 2014-09-08 LAB — CBC WITH DIFFERENTIAL/PLATELET
BASOS PCT: 1 % (ref 0–1)
Basophils Absolute: 0.2 10*3/uL — ABNORMAL HIGH (ref 0.0–0.1)
EOS PCT: 1 % (ref 0–5)
Eosinophils Absolute: 0.2 10*3/uL (ref 0.0–0.7)
HCT: 25.3 % — ABNORMAL LOW (ref 39.0–52.0)
HEMOGLOBIN: 8.2 g/dL — AB (ref 13.0–17.0)
LYMPHS ABS: 5.3 10*3/uL — AB (ref 0.7–4.0)
Lymphocytes Relative: 30 % (ref 12–46)
MCH: 29.4 pg (ref 26.0–34.0)
MCHC: 32.4 g/dL (ref 30.0–36.0)
MCV: 90.7 fL (ref 78.0–100.0)
MONO ABS: 1.1 10*3/uL — AB (ref 0.1–1.0)
Monocytes Relative: 6 % (ref 3–12)
NEUTROS ABS: 10.7 10*3/uL — AB (ref 1.7–7.7)
Neutrophils Relative %: 62 % (ref 43–77)
Platelets: 340 10*3/uL (ref 150–400)
RBC: 2.79 MIL/uL — AB (ref 4.22–5.81)
RDW: 23.2 % — ABNORMAL HIGH (ref 11.5–15.5)
WBC: 17.5 10*3/uL — ABNORMAL HIGH (ref 4.0–10.5)

## 2014-09-08 LAB — BASIC METABOLIC PANEL
Anion gap: 11 (ref 5–15)
BUN: 34 mg/dL — ABNORMAL HIGH (ref 6–20)
CHLORIDE: 101 mmol/L (ref 101–111)
CO2: 25 mmol/L (ref 22–32)
Calcium: 8 mg/dL — ABNORMAL LOW (ref 8.9–10.3)
Creatinine, Ser: 1.19 mg/dL (ref 0.61–1.24)
GFR calc Af Amer: >60 mL/min (ref >60)
GFR, EST NON AFRICAN AMERICAN: 52 mL/min — AB (ref >60)
GLUCOSE: 92 mg/dL (ref 65–99)
Potassium: 4.5 mmol/L (ref 3.5–5.1)
SODIUM: 137 mmol/L (ref 135–145)

## 2014-09-08 MED ORDER — FUROSEMIDE 20 MG PO TABS
20.0000 mg | ORAL_TABLET | Freq: Every day | ORAL | Status: DC
  Administered 2014-09-09 – 2014-09-12 (×3): 20 mg via ORAL
  Filled 2014-09-08 (×4): qty 1

## 2014-09-08 MED ORDER — IOHEXOL 350 MG/ML SOLN
100.0000 mL | Freq: Once | INTRAVENOUS | Status: AC | PRN
  Administered 2014-09-08: 100 mL via INTRAVENOUS

## 2014-09-08 MED ORDER — GI COCKTAIL ~~LOC~~: 30.0000 mL | Freq: Three times a day (TID) | ORAL | Status: DC | PRN

## 2014-09-08 MED ORDER — FUROSEMIDE 10 MG/ML IJ SOLN
20.0000 mg | Freq: Once | INTRAMUSCULAR | Status: AC
  Administered 2014-09-08: 20 mg via INTRAVENOUS
  Filled 2014-09-08: qty 2

## 2014-09-08 MED ORDER — ALBUTEROL SULFATE (2.5 MG/3ML) 0.083% IN NEBU
2.5000 mg | INHALATION_SOLUTION | RESPIRATORY_TRACT | Status: DC | PRN
  Administered 2014-09-08 – 2014-09-09 (×3): 2.5 mg via RESPIRATORY_TRACT
  Filled 2014-09-08 (×3): qty 3

## 2014-09-08 NOTE — Progress Notes (Signed)
Ambulated pt on room air.  Pt O2 sats starting were 98% room air and while ambulating went down to 92% room air.  Pt did have some shortness of breath but resolved after stopping for a few seconds.

## 2014-09-08 NOTE — Progress Notes (Signed)
Occupational Therapy Treatment Patient Details Name: Johnathan Strong MRN: [id number] DOB: Oct 10, 1923 Today's Date: 09/08/2014    History of present illness 81 y.o. male with past history of COPD, paroxysmal A. fib on Eliquis, CAD, hypertension was recently discharged from [facility] 6/9 after treatment for community-acquired pneumonia. He presented to the ER where he was noted to have a white count of 24K, chest x-ray with worsening pneumonia and moderate right pleural effusion.    OT comments  Pt stating he had urinated and the chair was wet and his gown. Stood to perform periarea hygiene and did well with min guard assist. O2 monitor not reading at times but O2 sats on RA during standing task at 92%. Replaced O2 at the end of the session. Pt did stand to use urinal also. Pt reports some fatigue in standing after several minutes of standing and leaning/moving around to clean/dry and 1-2/4 dyspnea noted.     Follow Up Recommendations  Home health OT;Supervision/Assistance - 24 hour    Equipment Recommendations  3 in 1 bedside comode    Recommendations for Other Services      Precautions / Restrictions Precautions Precautions: Fall Precaution Comments: monitor O2 Restrictions Weight Bearing Restrictions: No       Mobility Bed Mobility             Transfers Overall transfer level: Needs assistance Equipment used: None Transfers: Sit to/from Stand Sit to Stand: Min guard         General transfer comment: min guard to stand and sit down as pt tended to slightly plop back. Cues for hand placement.    Balance     Sitting balance-Leahy Scale: Good       Standing balance-Leahy Scale: Fair                     ADL                               Toileting- Water quality scientist and Hygiene: Min guard;Sit to/from stand         General ADL Comments: Pt's lunch tray arrived but pt reporting he had voided and chair and gown wet. Assisted  pt with setup of items to perform bathing/washing off. Pt did use urinal in standing with min guard assist also. Pt able to stand for at least 5 minutes to wash periareas, dry and use urinal with min guard assist. O2 sats on RA during standing task at 92%. Replaced O2 at end of session.       Vision                     Perception     Praxis      Cognition   Behavior During Therapy: WFL for tasks assessed/performed Overall Cognitive Status: Within Functional Limits for tasks assessed                       Extremity/Trunk Assessment               Exercises    Shoulder Instructions       General Comments      Pertinent Vitals/ Pain       Pain Assessment: No/denies pain Pain Score: 0-No pain  Home Living  Prior Functioning/Environment              Frequency Min 2X/week     Progress Toward Goals  OT Goals(current goals can now be found in the care plan section)  Progress towards OT goals: Progressing toward goals  Acute Rehab OT Goals Patient Stated Goal: get better  Plan Discharge plan remains appropriate    Co-evaluation                 End of Session     Activity Tolerance Patient tolerated treatment well   Patient Left in chair;with call bell/phone within reach   Nurse Communication          Time: 1220-1238 OT Time Calculation (min): 18 min  Charges: OT General Charges $OT Visit: 1 Procedure OT Treatments $Self Care/Home Management : 8-22 mins  Jules Schick  [phone] 09/08/2014, 12:51 PM

## 2014-09-08 NOTE — Progress Notes (Signed)
Pt was resting and became short of breath. O2 sats were normal and respirations 38. Gave pt an albuterol treatment. After treatment pt stated he was feeling better and wanted to try and go back to sleep. Will continue to monitor.  Albertina Senegal E

## 2014-09-08 NOTE — Care Management (Signed)
Important Message  Patient Details  Name: Johnathan Strong MRN: [id number] Date of Birth: 11-06-1923   Medicare Important Message Given:  Yes-second notification given    [name] 09/08/2014, 10:50 AM

## 2014-09-08 NOTE — Progress Notes (Signed)
Physical Therapy Treatment Patient Details Name: RISHARD DELANGE MRN: [id number] DOB: August 12, 1923 Today's Date: 09/08/2014    History of Present Illness 81 y.o. male with past history of COPD, paroxysmal A. fib on Eliquis, CAD, hypertension was recently discharged from [facility] 6/9 after treatment for community-acquired pneumonia. He presented to the ER where he was noted to have a white count of 24K, chest x-ray with worsening pneumonia and moderate right pleural effusion.     PT Comments    Pt stated he was very SOB earlier this morning with bed mobility. Today he walked 240' with RW, SaO2 92-94% on RA with mild dyspnea.   Follow Up Recommendations  Home health PT;Supervision for mobility/OOB     Equipment Recommendations  Rolling walker with 5" wheels    Recommendations for Other Services       Precautions / Restrictions Precautions Precautions: Fall Precaution Comments: monitor O2 Restrictions Weight Bearing Restrictions: No    Mobility  Bed Mobility Overal bed mobility: Modified Independent Bed Mobility: Supine to Sit     Supine to sit: Modified independent (Device/Increase time)     General bed mobility comments: with bedrail  Transfers Overall transfer level: Needs assistance Equipment used: Rolling walker (2 wheeled) Transfers: Sit to/from Stand Sit to Stand: Modified independent (Device/Increase time);Supervision         General transfer comment: cues for hand placment  Ambulation/Gait Ambulation/Gait assistance: Supervision Ambulation Distance (Feet): 240 Feet Assistive device: Rolling walker (2 wheeled) Gait Pattern/deviations: Step-through pattern   Gait velocity interpretation: at or above normal speed for age/gender General Gait Details: steady with RW, SaO2 92-94% on RA while walking   Stairs            Wheelchair Mobility    Modified Rankin (Stroke Patients Only)       Balance     Sitting balance-Leahy Scale: Good       Standing balance-Leahy Scale: Fair                      Cognition Arousal/Alertness: Awake/alert Behavior During Therapy: WFL for tasks assessed/performed Overall Cognitive Status: Within Functional Limits for tasks assessed                      Exercises General Exercises - Lower Extremity Hip Flexion/Marching: AROM;Both;10 reps;Standing    General Comments        Pertinent Vitals/Pain Pain Score: 0-No pain    Home Living                      Prior Function            PT Goals (current goals can now be found in the care plan section) Acute Rehab PT Goals Patient Stated Goal: get better PT Goal Formulation: With patient/family Time For Goal Achievement: 09/21/14 Potential to Achieve Goals: Good Progress towards PT goals: Progressing toward goals    Frequency  Min 3X/week    PT Plan Current plan remains appropriate    Co-evaluation             End of Session Equipment Utilized During Treatment: Gait belt Activity Tolerance: Patient tolerated treatment well Patient left: in chair;with call bell/phone within reach;with family/visitor present     Time: 1017-5102 PT Time Calculation (min) (ACUTE ONLY): 21 min  Charges:  $Gait Training: 8-22 mins  G Codes:      Philomena Doheny 09/08/2014, 11:41 AM [phone]

## 2014-09-08 NOTE — Progress Notes (Signed)
Patient Name: Johnathan Strong Date of Encounter: 09/08/2014  Primary Cardiologist: Dr. Mare Ferrari   Principal Problem:   HCAP (healthcare-associated pneumonia) Active Problems:   CAD (coronary artery disease)   Atrial fibrillation   Chronic lymphatic leukemia   Pleural effusion on right   Sepsis   Pleural effusion   Pleural effusion, right   Blood poisoning   Chest tube in place   Pleural effusion associated with pulmonary infection   SOB (shortness of breath)   Essential hypertension    SUBJECTIVE  Had significant dyspnea last night. Was given a low dose lasix this morning. No CP.   CURRENT MEDS . atorvastatin  10 mg Oral q1800  . calcium carbonate  1 tablet Oral BID  . digoxin  0.125 mg Oral Daily  . diltiazem  240 mg Oral Daily  . feeding supplement (ENSURE ENLIVE)  237 mL Oral TID BM  . furosemide  20 mg Intravenous Once  . metoprolol tartrate  50 mg Oral BID  . montelukast  10 mg Oral QHS  . polyethylene glycol  17 g Oral BID  . senna-docusate  1 tablet Oral BID  . senna-docusate  1 tablet Oral QHS    OBJECTIVE  Filed Vitals:   09/07/14 1615 09/07/14 2042 09/08/14 0445 09/08/14 0742  BP:  151/52 150/48 168/49  Pulse: 72 69 78 82  Temp:  98.6 F (37 C) 98.2 F (36.8 C)   TempSrc:  Oral Oral   Resp:    20  Height:      Weight:   158 lb 9.6 oz (71.94 kg)   SpO2: 90% 100% 100% 99%   No intake or output data in the 24 hours ending 09/08/14 0807 Filed Weights   09/06/14 0428 09/07/14 0430 09/08/14 0445  Weight: 168 lb 3.4 oz (76.3 kg) 158 lb 15.2 oz (72.1 kg) 158 lb 9.6 oz (71.94 kg)    PHYSICAL EXAM  General: Pleasant, NAD. Neuro: Alert and oriented X 3. Moves all extremities spontaneously. Psych: Normal affect. HEENT:  Normal  Neck: Supple without bruits or JVD. Lungs:  Resp regular and unlabored. L basilar rale, markedly diminished breath sound R base, some rale in R mid lung.  Heart: RRR no s3, s4, or murmurs. Abdomen: Soft, non-tender,  non-distended, BS + x 4.  Extremities: No clubbing, cyanosis or edema. DP/PT/Radials 2+ and equal bilaterally.  Accessory Clinical Findings  CBC  Recent Labs  09/07/14 0446 09/08/14 0448  WBC 24.2* 17.5*  NEUTROABS  --  10.7*  HGB 8.0* 8.2*  HCT 24.9* 25.3*  MCV 88.6 90.7  PLT 356 626   Basic Metabolic Panel  Recent Labs  09/07/14 0446 09/08/14 0448  NA 135 137  K 4.7 4.5  CL 99* 101  CO2 29 25  GLUCOSE 136* 92  BUN 33* 34*  CREATININE 1.43* 1.19  CALCIUM 8.2* 8.0*    TELE NSR with HR 60-70s    ECG  No new EKG  Echocardiogram 04/22/2014  LV EF: 60% -  65%  ------------------------------------------------------------------- Indications:   Dyspnea 786.09.  ------------------------------------------------------------------- History:  PMH:  Coronary artery disease. Chronic obstructive pulmonary disease. Risk factors: Former tobacco use. Hypertension.  ------------------------------------------------------------------- Study Conclusions  - Left ventricle: The cavity size was normal. There was mild concentric hypertrophy. Systolic function was normal. The estimated ejection fraction was in the range of 60% to 65%. Wall motion was normal; there were no regional wall motion abnormalities. Doppler parameters are consistent with abnormal left ventricular relaxation (grade 1  diastolic dysfunction). Doppler parameters are consistent with high ventricular filling pressure. - Mitral valve: Moderately calcified annulus. There was mild regurgitation. - Left atrium: The atrium was mildly dilated.     Radiology/Studies  Dg Chest 2 View  09/07/2014   CLINICAL DATA:  Persistent but improving shortness of breath over the course of her current hospitalization for right lower lobe pneumonia.  EXAM: CHEST  2 VIEW  COMPARISON:  09/06/2014 and earlier, including CT chest 08/30/2014.  FINDINGS: Cardiac silhouette normal in size. Thoracic aorta  atherosclerotic, unchanged. Hilar and mediastinal contours otherwise unremarkable. Airspace consolidation in the right middle lobe and right lower lobe and moderate sized right pleural effusion, unchanged over the past 3 days, slightly improved since 5 days ago. Stable calcified granuloma in the medial left lower lobe. No new pulmonary parenchymal abnormalities. Pulmonary vascularity normal without evidence pulmonary edema. Degenerative changes and DISH involving the thoracic spine.  IMPRESSION: 1. Stable pneumonia and/or atelectasis in the right middle lobe and right lower lobe and stable moderate sized right pleural effusion. 2. No new abnormalities.   Electronically Signed   By: Evangeline Dakin M.D.   On: 09/07/2014 15:38   ASSESSMENT AND PLAN  1. Recurrent paroxysmal atrial fibrillation/flutter with RVR in the setting of severe lung dx - converted to NSR in AM of 09/06/2014 after 3.9 sec pause - CHADS2VASC score 6 (hypertension, age above 30, history of TIA, and vascular disease with prior coronary artery stents - Continue metoprolol 50mg  BID (increased on 7/1). Previously had hypotension deemed secondary to dehydration. Continue long-acting diltiazem 240 mg and digoxin0.125 mg daily.  - Eliquis held due to R lung pleural effusion, chest tubes removed last week, per pulm service, ok to resume Eliquis on 7/4 if no further R tap planned. Will check with pulmonary service, if no tap planned, will start eliquis today   Addendum: discussed with Dr. Chase Caller, unknown if he needs another tap, pulmonary service will see him again today and make decision   2. Acute on chronic diastolic HF  - has dyspnea this morning, CXR yesterday showed stable PNA in RML and RLL. Stable moderate sized R pleural effusion  - Given 20mg  IV this morning, will order another 20mg  IV lasix for 2pm. Start on 20mg  daily PO lasix tomorrow (previously on 20mg  PRN lasix)  3. HCAP: management per IM  4.  Parapneumonic effusion s/p chest tube  5. CAD s/p DES to LAD in 2014, also noted to have ostial RCA occlusion with good L to R collateral - continue BB and statin, plavix on hold. Not on ASA given systemic anticoagulation need  6. COPD 7. CLL followed at [facility] 8. Anemia  Signed, Almyra Deforest PA-C Pager: [phone] As above, patient seen and examined. He does have mild dyspnea but no chest pain. He remains in sinus rhythm on telemetry. Continue metoprolol and Cardizem. Discontinue digoxin. Resume apixaban once all procedures complete. Would also resume Plavix once okay with pulmonary. Pulmonary to see concerning need for repeat thoracentesis on the right. Kirk Ruths

## 2014-09-08 NOTE — Progress Notes (Signed)
PULMONARY/CCM NOTE  Requesting MD/Service: August Saucer Date of admission: 6/21 Date of consult: 6/22 Reason for consultation:  parapneumonic effusion  Pt Profile:  29 M admitted to [facility] 6/04 - 6/09 with CAP. CXR on 6/09 revealed R effusion. He failed to recover as anticipated after discharge and readmitted 6/21 with large R effusion. Thoracentesis performed 6/22 revealed exudative characteristics (LDH 341, protein 3.7, with > 10,000 WBC, 93% neutrophils). Fluid was described as bloody. Pulm medicine asked to assist in further eval and mgmt of parapneumonic effusion   Significant tests/ events 6/23  A. fib RVR- transferred to stepdown unit 6/23 [place] of right chest - marked fibrinous tissue and concern for loculation t/o the right chest wall Rt chest tube 6/23 >>TPA 6/24: 1200 ml output, on 6/25 and 6/26 minimal output.  CT chest 6/26: Interval improvement in a moderate size right pleural fluid collection with associated compressive atelectasis over the right middle and lower lobes. Right-sided chest tube in with tip over the posterior pleural space in the most superior aspect of the pleural fluid collection. 6/28: posterior 12 fr chest tube placed in IR  6/29: right lateral 32 fr CT removed.  7/1 posterior chest tube removed  Subjective-  Slitting up on side of bed. Reports being more sob since 0300  Filed Vitals:   09/07/14 2042 09/08/14 0445 09/08/14 0742 09/08/14 0805  BP: 151/52 150/48 168/49   Pulse: 69 78 82   Temp: 98.6 F (37 C) 98.2 F (36.8 C)    TempSrc: Oral Oral    Resp:   20   Height:      Weight:  158 lb 9.6 oz (71.94 kg)    SpO2: 100% 100% 99% 98%    EXAM:  Gen: WDWN frail, able to speak in full paragraphs, reports being more sob HEENT: no jvd Neck: no LAN Lungs: decreased RLL . Dressings x 2 from old chest tube sites. Cardiovascular: HSR no M noted VR 82 Abdomen: soft, NT, +BS Ext: no C/C/E Neuro: awake, alert  DATA:  BMET    Component Value Date/Time    NA 137 09/08/2014 0448   NA 144 04/03/2014 1023   K 4.5 09/08/2014 0448   CL 101 09/08/2014 0448   CO2 25 09/08/2014 0448   GLUCOSE 92 09/08/2014 0448   GLUCOSE 94 04/03/2014 1023   BUN 34* 09/08/2014 0448   BUN 16 04/03/2014 1023   CREATININE 1.19 09/08/2014 0448   CALCIUM 8.0* 09/08/2014 0448   GFRNONAA 52* 09/08/2014 0448   GFRAA >60 09/08/2014 0448    CBC    Component Value Date/Time   WBC 17.5* 09/08/2014 0448   WBC 9.5 04/03/2014 1038   RBC 2.79* 09/08/2014 0448   RBC 4.2* 04/03/2014 1038   HGB 8.2* 09/08/2014 0448   HGB 12.0* 04/03/2014 1038   HCT 25.3* 09/08/2014 0448   HCT 40.0* 04/03/2014 1038   PLT 340 09/08/2014 0448   MCV 90.7 09/08/2014 0448   MCV 95.4 04/03/2014 1038   MCH 29.4 09/08/2014 0448   MCH 28.6 04/03/2014 1038   MCHC 32.4 09/08/2014 0448   MCHC 30.0* 04/03/2014 1038   RDW 23.2* 09/08/2014 0448   LYMPHSABS 5.3* 09/08/2014 0448   MONOABS 1.1* 09/08/2014 0448   EOSABS 0.2 09/08/2014 0448   BASOSABS 0.2* 09/08/2014 0448    P CXR  09/07/14 [place] with chest tube out x 3 days   IMPRESSION:   1) HCAP (healthcare-associated pneumonia) 2) Chronic lymphatic leukemia 3) R  effusion  parapneumonic, complex.  4) 02 dep respiratory failure  >D/c'd lateral 32 fr CT  6/29 as no output for 24 hrs. 12 fr Pigtail placed 6/29. 382ml blood tinged Pleural fluid pulled out 7/1  6/30 pleural fluid LDH 3452 7/1 posterior ct removed per IR 7/5 increased sob   PLAN:  F/u pending cultures/no growth to date  On Levaquin since 6/30 -   Remains afebrile  7/5  Recheck cxr  AF w/ RVR, anemia, hyponatremia  Plan Per IM and cards service.  RVR continues to be an issue, dig started 6/30. ? When to restart anticoagulation > will check c x r 7/5 for completeness if no further thoracentesis needed then resume anticoagulation.   Discussion  ; He is frail and there is a concern for complications from repeated invasive procedures therefore we must be guarded with  further invasive procedures.   Richardson Landry Azuri Bozard ACNP [facility] PCCM Pager [phone] till 3 pm If no answer page [phone] 09/08/2014, 9:41 AM

## 2014-09-08 NOTE — Progress Notes (Signed)
Pt complaint SHOB and dyspnea at rest. BP 168/49, O2 99% 3L [place], HR 83.  Dr. Grandville Silos notified and orders received to give 20 mg IV lasix.  Will continue to closely monitor.

## 2014-09-08 NOTE — Progress Notes (Signed)
[facility] PROGRESS NOTE  RODGERICK GILLIAND IEP:[id number] DOB: 12/12/1923 DOA: 08/25/2014 PCP: Redge Gainer, MD  Interim Summary Patient is a pleasant 81 year old gentleman with a past medical history of CML, recently discharged from the medicine service on 08/19/2014, admitted on 08/09/2014 during which he was treated for community-acquired pneumonia. Patient showed gradual improvement as he was weaned off of supplemental oxygen, tolerating by mouth intake, and was discharged on Ceftin. His wife reporting that patient did well the week after discharge, got back to his normal routine, then had a decline this past Monday, developing fevers, chills, night sweats. Chest x-ray performed on admission revealed moderate right pleural effusion with associated right lung base consolidation/collapse. He was started on broad-spectrum IV antibiotic therapy with vancomycin and ceftazidime, undergoing ultrasound-guided thoracentesis by interventional radiology on 08/26/2014. Pulmonary critical care medicine was consulted regarding pleural effusion as it was felt he would benefit chest tube. PCCM placed a 32 French chest tube on 08/27/2014 (rather than wayne cath) since there was marked fibrinous tissue and concern for loculation. Subsequent imagining showed incomplete drainage for which TPA was injected into pleural space. This seemed to help initially however subsequent injections did not yield significant fluid. PCCM recommended proceeding with IR consult for PIG-tail catheter, which was performed on 09/01/2014. Hospitalization was complicated by the moment of A. fib with RVR on 08/27/2014 likely precipitated by underlying pulmonary infectious process and acute hypoxemic respiratory failure. Controlling Afib has been a challenge. Dr Coralyn Pear consulted cardiology on 09/01/2014. Patient was followed by cardiology and pulmonary during the hospitalization. Patient had issues with A. fib with RVR and being hypotensive and a  such difficult to titrate his rate controlling medications. The Wynetta Emery was also added to his regimen. Chest tubes were subsequently removed. Patient subsequently converted back into normal sinus rhythm. Blood pressure improved. And heart rate is remained controlled. Patient with some bouts of shortness of breath felt to be secondary to possible volume overload. Repeat chest x-ray with no acute abnormalities. Patient's been given some IV Lasix and will be transitioned to oral Lasix on 09/09/2014. Patient was also received 2 weeks of antibiotic treatment. Pulmonary and critical care as well as cardiology following.   Assessment/Plan: #1 healthcare associated pneumonia with probable parapneumonic effusion Patient recently discharged on 08/19/2014 from the hospital after being treated for community-acquired pneumonia who presented with a probable parapneumonic effusion. Patient underwent ultrasound-guided thoracentesis by interventional radiology with a liter of yellow colored fluid removed with a cell count of 10,299. Thoracentesis was consistent with exudative characteristics. No organisms seen on Gram stain. Blood cultures pending. Afebrile. WBC trending down. Patient also had chest tube placed on 08/27/2014 with 1200 mL output in all 625 and 626 minimal output. On 09/01/2014 posterior 12 French chest tube was placed per IR. 09/02/2014 patient had right lateral chest tube removed per critical care. Patient is afebrile. WBC trending down. Treatment options were discussed with patient and family including drain catheter versus repeat thoracentesis. VATS procedure was felt not to be well tolerated due to his multiple comorbidities, advanced age, A. fib with RVR. IV Tressie Ellis has been changed to oral levaquin. Pulmonary medicine gave TPA injections into pleural space due to amount of fibrous material and incomplete fluid drainage from the chest tube. Patient did have a repeat chest CT that showed interval  improvement in right-sided pleural effusion. Patient was evaluated by Dr. Roxan Hockey of cardiothoracic surgery who did not recommend a VATS procedure. Patient had pigtail catheter placement done on 09/01/2014 per interventional radiology.  Second chest tube was removed. Repeat chest x-ray done yesterday morning with no interval change in the chest. Persistent right basilar collapse/consolidation and effusion. Clinical improvement. Continue current therapy. Per Pulmonary critical care.  #2 acute hypoxemic respiratory failure Likely multifactorial secondary to problem #1. Improving slowly. Patient with some shortness of breath on ambulation to the bathroom without his oxygen. Repeat chest x-ray with no significant new changes. Patient was given a dose of Lasix 20 mg IV 1 as well as nebs as needed. Chest x-ray repeated. Patient will be given another dose of IV Lasix this afternoon and then to start oral Lasix 20 mg daily tomorrow. Patient is status post 2 weeks of antibiotics. Check O2 on room air and on ambulation to see whether patient qualifies for home oxygen.  #3 A. fib with RVR CHADS2VASC score 6. Patient was noted to have heart rates in the 140s 4 nights ago. Patient has converted back into normal sinus rhythm. Felt to be secondary to patient's pulmonary process. Patient has been seen in consultation by cardiology were adjusting patient's rate controlling medications. Patient blood pressure with some improvement. Metoprolol dose has been increased to 50 mg twice daily per cardiology. Diltiazem has been decreased to 240 mg daily per cardiology as patient was noted to be dizzy and hypotensive 5 days ago. Patient was also given some digoxin. Digoxin has been discontinued per cardiology. Per cardiology. Pulmonary  recommendeds to possibly resume anticoagulation 09/07/2014.   #4 coronary artery disease status post DES to the LAD in 2014 Stable. Continue beta blocker, statin. Plavix on hold. Per cardiology.  Pulmonary to advise when Plavix can be resumed.  #5 acute on chronic diastolic heart failure Patient noted to have shortness of breath early on this morning. Chest x-ray done from yesterday showed stable pneumonia plus or minus atelectasis in the right middle and lower lobe with moderate size right pleural effusion. Patient given Lasix 20 mg IV 1 this morning. Another dose of IV Lasix ordered this afternoon per cardiology. Patient to be started on 20 mg of Lasix tomorrow. Per cardiology. Follow.  #6 sepsis Noted on admission as patient had a white count of 24.2, respiratory rate of 33 heart rate of 107 with source of infection being healthcare associated pneumonia. Blood cultures pending with no growth to date. Pleural fluid cultures pending. Patient status post 2 weeks of empiric antibiotics.   #7 hypertension Due to hypotension, Cardizem dose was decreased back to his home regimen. Metoprolol dose has been increased slightly to 50 mg twice a day. Cozaar on hold.   #8 acute kidney injury Improvement. Stable. Follow.  #9 leukocytosis WBC is trending back up and currently at 17.5 from 24.2 yesterday. Patient is afebrile. Patient denies any diarrhea. Patient denies any dysuria. Repeat chest x-ray done yesterday with stable pneumonia and/or atelectasis in the right middle lobe and right lower lobe and stable moderate size right pleural effusion. No new abnormalities. Patient status post approximately 2 weeks of antibiotics which have been discontinued. Patient is currently afebrile. No further antibiotics needed at this time.   #10 CLL Outpatient follow-up.  #11 prophylaxis SCDs for DVT prophylaxis.  Code Status: Full Family Communication: Updated patient, wife at bedside. Disposition Plan: Home with home health when medically stable.  Consultants:  Cardiology: Dr. Haroldine Laws 09/01/2014  CT surgery Dr. Roxan Hockey 08/28/2014  Critical care medicine Dr. Alva Garnet  08/26/2014  Procedures:  Ultrasound-guided thoracentesis 08/26/2014  CT guided drainage catheter placement into caudal aspect of right pleural space per Dr.  Watts 09/01/2014  Right chest tube placement 08/27/2014  CT chest 08/30/2014, 08/25/2014  Right Chest tube removal per interventional radiology 09/04/2014  Significant tests/ events 6/23 A. fib RVR- transferred to stepdown unit 6/23 [place] of right chest - marked fibrinous tissue and concern for loculation t/o the right chest wall Rt chest tube 6/23 >>TPA 6/24: 1200 ml output, on 6/25 and 6/26 minimal output.  CT chest 6/26: Interval improvement in a moderate size right pleural fluid collection with associated compressive atelectasis over the right middle and lower lobes. Right-sided chest tube in with tip over the posterior pleural space in the most superior aspect of the pleural fluid collection. 6/28: posterior 12 fr chest tube placed in IR  6/29: right lateral 32 fr CT removed.  7/1 posterior chest tube removed  Antibiotics:  IV Fortaz 08/25/2014>>>>>>09/03/14  IV vancomycin 08/25/2014>>>> 09/01/2014  Oral levaquin 09/03/14>>>>> 09/06/2014  HPI/Subjective: Patient with complaints of worsening shortness of breath early this morning. Patient denies any chest pain.  Objective: Filed Vitals:   09/08/14 0742  BP: 168/49  Pulse: 82  Temp:   Resp: 20   No intake or output data in the 24 hours ending 09/08/14 1020 Filed Weights   09/06/14 0428 09/07/14 0430 09/08/14 0445  Weight: 76.3 kg (168 lb 3.4 oz) 72.1 kg (158 lb 15.2 oz) 71.94 kg (158 lb 9.6 oz)    Exam:   General:  NAD  Cardiovascular: RRR  Respiratory: Decreased breath sounds in the right base, otherwise clear. Right chest tube has been removed. Pigtail catheter in place.  Abdomen: Soft, nontender, nondistended, positive bowel sounds.  Musculoskeletal: No clubbing cyanosis or edema.  Data Reviewed: Basic Metabolic Panel:  Recent Labs Lab  09/03/14 0453 09/04/14 0343 09/05/14 0458 09/06/14 0416 09/07/14 0446 09/08/14 0448  NA 132* 134* 132* 135 135 137  K 4.5 5.0 5.1 4.5 4.7 4.5  CL 103 101 98* 100* 99* 101  CO2 24 26 25 28 29 25   GLUCOSE 107* 101* 105* 106* 136* 92  BUN 28* 24* 25* 26* 33* 34*  CREATININE 1.22 1.23 1.22 1.28* 1.43* 1.19  CALCIUM 8.0* 8.4* 8.0* 8.0* 8.2* 8.0*  MG 2.0  --   --   --   --   --    Liver Function Tests: No results for input(s): AST, ALT, ALKPHOS, BILITOT, PROT, ALBUMIN in the last 168 hours. No results for input(s): LIPASE, AMYLASE in the last 168 hours. No results for input(s): AMMONIA in the last 168 hours. CBC:  Recent Labs Lab 09/04/14 0343 09/05/14 0458 09/06/14 0416 09/07/14 0446 09/08/14 0448  WBC 16.9* 17.4* 18.5* 24.2* 17.5*  NEUTROABS  --   --   --   --  10.7*  HGB 9.0* 8.4* 7.9* 8.0* 8.2*  HCT 27.9* 26.0* 24.4* 24.9* 25.3*  MCV 88.9 88.7 89.4 88.6 90.7  PLT 296 325 336 356 340   Cardiac Enzymes: No results for input(s): CKTOTAL, CKMB, CKMBINDEX, TROPONINI in the last 168 hours. BNP (last 3 results)  Recent Labs  04/21/14 1541 08/25/14 0700  BNP 484.0* 224.5*    ProBNP (last 3 results) No results for input(s): PROBNP in the last 8760 hours.  CBG: No results for input(s): GLUCAP in the last 168 hours.  Recent Results (from the past 240 hour(s))  Culture, body fluid-bottle     Status: None   Collection Time: 09/01/14  3:45 PM  Result Value Ref Range Status   Specimen Description PLEURAL FLUID  Final   Special Requests NONE  Final   Culture NO GROWTH 5 DAYS  Final   Report Status 09/06/2014 FINAL  Final  Gram stain     Status: None   Collection Time: 09/01/14  3:45 PM  Result Value Ref Range Status   Specimen Description PLEURAL FLUID  Final   Special Requests NONE  Final   Gram Stain   Final    ABUNDANT WBC PRESENT,BOTH PMN AND MONONUCLEAR NO ORGANISMS SEEN    Report Status 09/02/2014 FINAL  Final     Studies: Dg Chest 2 View  09/07/2014    CLINICAL DATA:  Persistent but improving shortness of breath over the course of her current hospitalization for right lower lobe pneumonia.  EXAM: CHEST  2 VIEW  COMPARISON:  09/06/2014 and earlier, including CT chest 08/30/2014.  FINDINGS: Cardiac silhouette normal in size. Thoracic aorta atherosclerotic, unchanged. Hilar and mediastinal contours otherwise unremarkable. Airspace consolidation in the right middle lobe and right lower lobe and moderate sized right pleural effusion, unchanged over the past 3 days, slightly improved since 5 days ago. Stable calcified granuloma in the medial left lower lobe. No new pulmonary parenchymal abnormalities. Pulmonary vascularity normal without evidence pulmonary edema. Degenerative changes and DISH involving the thoracic spine.  IMPRESSION: 1. Stable pneumonia and/or atelectasis in the right middle lobe and right lower lobe and stable moderate sized right pleural effusion. 2. No new abnormalities.   Electronically Signed   By: Evangeline Dakin M.D.   On: 09/07/2014 15:38   Dg Chest Port 1 View  09/08/2014   CLINICAL DATA:  Dyspnea and shortness of breath with onset this morning ; history of COPD, leukemia, bladder malignancy, coronary artery disease  EXAM: PORTABLE CHEST - 1 VIEW  COMPARISON:  PA and lateral chest of September 07, 2014  FINDINGS: The left lung is well-expanded and clear. On the right there is persistent increased density in the mid and lower lung with partial obscuration of the hemidiaphragm. The heart is normal in size. The pulmonary vascularity is not engorged. The observed bony thorax is unremarkable.  IMPRESSION: Persistent increased interstitial and alveolar density in the right mid and lower lung consistent with atelectasis or pneumonia. The known small right pleural effusion is not clearly evident on this portable view.   Electronically Signed   By: David  Martinique M.D.   On: 09/08/2014 10:10    Scheduled Meds: . atorvastatin  10 mg Oral q1800  .  calcium carbonate  1 tablet Oral BID  . diltiazem  240 mg Oral Daily  . feeding supplement (ENSURE ENLIVE)  237 mL Oral TID BM  . furosemide  20 mg Intravenous Once  . [START ON 09/09/2014] furosemide  20 mg Oral Daily  . metoprolol tartrate  50 mg Oral BID  . montelukast  10 mg Oral QHS  . polyethylene glycol  17 g Oral BID  . senna-docusate  1 tablet Oral BID  . senna-docusate  1 tablet Oral QHS   Continuous Infusions: . sodium chloride 10 mL/hr at 09/01/14 1204    Principal Problem:   HCAP (healthcare-associated pneumonia) Active Problems:   Pleural effusion associated with pulmonary infection   CAD (coronary artery disease)   Atrial fibrillation   Chronic lymphatic leukemia   Pleural effusion on right   Sepsis   Pleural effusion   Pleural effusion, right   Blood poisoning   Chest tube in place   SOB (shortness of breath)   Essential hypertension    Time spent: 35 minutes    Telisa Ohlsen  MD [facility] Pager [phone]. If 7PM-7AM, please contact night-coverage at [email or web address], password [facility] 09/08/2014, 10:20 AM  LOS: 14 days

## 2014-09-09 ENCOUNTER — Inpatient Hospital Stay (HOSPITAL_COMMUNITY): Payer: Medicare Other

## 2014-09-09 ENCOUNTER — Ambulatory Visit (HOSPITAL_COMMUNITY): Payer: Medicare Other

## 2014-09-09 DIAGNOSIS — J441 Chronic obstructive pulmonary disease with (acute) exacerbation: Secondary | ICD-10-CM

## 2014-09-09 DIAGNOSIS — R0602 Shortness of breath: Secondary | ICD-10-CM

## 2014-09-09 DIAGNOSIS — R0600 Dyspnea, unspecified: Secondary | ICD-10-CM

## 2014-09-09 LAB — CBC
HEMATOCRIT: 25.8 % — AB (ref 39.0–52.0)
Hemoglobin: 8.2 g/dL — ABNORMAL LOW (ref 13.0–17.0)
MCH: 28.6 pg (ref 26.0–34.0)
MCHC: 31.8 g/dL (ref 30.0–36.0)
MCV: 89.9 fL (ref 78.0–100.0)
PLATELETS: 317 10*3/uL (ref 150–400)
RBC: 2.87 MIL/uL — ABNORMAL LOW (ref 4.22–5.81)
RDW: 22.8 % — AB (ref 11.5–15.5)
WBC: 14 10*3/uL — AB (ref 4.0–10.5)

## 2014-09-09 LAB — BASIC METABOLIC PANEL
Anion gap: 8 (ref 5–15)
BUN: 26 mg/dL — ABNORMAL HIGH (ref 6–20)
CALCIUM: 8.3 mg/dL — AB (ref 8.9–10.3)
CO2: 33 mmol/L — ABNORMAL HIGH (ref 22–32)
Chloride: 98 mmol/L — ABNORMAL LOW (ref 101–111)
Creatinine, Ser: 1.3 mg/dL — ABNORMAL HIGH (ref 0.61–1.24)
GFR calc Af Amer: 54 mL/min — ABNORMAL LOW (ref >60)
GFR calc non Af Amer: 47 mL/min — ABNORMAL LOW (ref >60)
GLUCOSE: 98 mg/dL (ref 65–99)
POTASSIUM: 4.5 mmol/L (ref 3.5–5.1)
Sodium: 139 mmol/L (ref 135–145)

## 2014-09-09 MED ORDER — METHYLPREDNISOLONE SODIUM SUCC 40 MG IJ SOLR
40.0000 mg | Freq: Four times a day (QID) | INTRAMUSCULAR | Status: DC
  Administered 2014-09-09 – 2014-09-11 (×10): 40 mg via INTRAVENOUS
  Filled 2014-09-09 (×11): qty 1

## 2014-09-09 MED ORDER — LEVALBUTEROL HCL 0.63 MG/3ML IN NEBU
0.6300 mg | INHALATION_SOLUTION | RESPIRATORY_TRACT | Status: DC | PRN
  Administered 2014-09-09 – 2014-09-15 (×19): 0.63 mg via RESPIRATORY_TRACT
  Filled 2014-09-09 (×21): qty 3

## 2014-09-09 MED ORDER — HEPARIN SODIUM (PORCINE) 5000 UNIT/ML IJ SOLN
5000.0000 [IU] | Freq: Three times a day (TID) | INTRAMUSCULAR | Status: DC
  Administered 2014-09-09: 5000 [IU] via SUBCUTANEOUS
  Filled 2014-09-09: qty 1

## 2014-09-09 MED ORDER — BUDESONIDE 0.5 MG/2ML IN SUSP
0.5000 mg | Freq: Two times a day (BID) | RESPIRATORY_TRACT | Status: DC
  Administered 2014-09-09 – 2014-09-15 (×13): 0.5 mg via RESPIRATORY_TRACT
  Filled 2014-09-09 (×13): qty 2

## 2014-09-09 MED ORDER — HEPARIN (PORCINE) IN NACL 100-0.45 UNIT/ML-% IJ SOLN
1100.0000 [IU]/h | INTRAMUSCULAR | Status: AC
  Administered 2014-09-09: 1100 [IU]/h via INTRAVENOUS
  Filled 2014-09-09: qty 250

## 2014-09-09 MED ORDER — ARFORMOTEROL TARTRATE 15 MCG/2ML IN NEBU
15.0000 ug | INHALATION_SOLUTION | Freq: Two times a day (BID) | RESPIRATORY_TRACT | Status: DC
  Administered 2014-09-09 – 2014-09-15 (×13): 15 ug via RESPIRATORY_TRACT
  Filled 2014-09-09 (×15): qty 2

## 2014-09-09 MED ORDER — LORAZEPAM 0.5 MG PO TABS
0.5000 mg | ORAL_TABLET | Freq: Once | ORAL | Status: AC
  Administered 2014-09-09: 0.5 mg via ORAL
  Filled 2014-09-09: qty 1

## 2014-09-09 MED ORDER — DILTIAZEM HCL ER COATED BEADS 120 MG PO CP24
120.0000 mg | ORAL_CAPSULE | Freq: Once | ORAL | Status: AC
  Administered 2014-09-09: 120 mg via ORAL
  Filled 2014-09-09: qty 1

## 2014-09-09 MED ORDER — ALBUTEROL SULFATE (2.5 MG/3ML) 0.083% IN NEBU: 2.5000 mg | INHALATION_SOLUTION | RESPIRATORY_TRACT | Status: DC | PRN

## 2014-09-09 NOTE — Progress Notes (Signed)
Pt back in A-FIB, rate 130s, IV metoprolol Given. Will continue to monitor. Etta Quill, RN

## 2014-09-09 NOTE — Progress Notes (Signed)
Patient Name: Johnathan Strong Date of Encounter: 09/09/2014  Primary Cardiologist: Dr. Mare Ferrari   Principal Problem:   HCAP (healthcare-associated pneumonia) Active Problems:   CAD (coronary artery disease)   Atrial fibrillation   Chronic lymphatic leukemia   Pleural effusion on right   Sepsis   Pleural effusion   Pleural effusion, right   Blood poisoning   Chest tube in place   Pleural effusion associated with pulmonary infection   SOB (shortness of breath)   Essential hypertension   Respiratory failure    SUBJECTIVE  Denies any CP. States he is not sure why he wakes up around 4AM every morning having SOB, but it seems the albuterol nebulizer helps. He also noted his O2 saturation only went down to 92% on ambulation   CURRENT MEDS . atorvastatin  10 mg Oral q1800  . calcium carbonate  1 tablet Oral BID  . diltiazem  240 mg Oral Daily  . feeding supplement (ENSURE ENLIVE)  237 mL Oral TID BM  . furosemide  20 mg Oral Daily  . metoprolol tartrate  50 mg Oral BID  . montelukast  10 mg Oral QHS  . polyethylene glycol  17 g Oral BID  . senna-docusate  1 tablet Oral BID  . senna-docusate  1 tablet Oral QHS    OBJECTIVE  Filed Vitals:   09/08/14 1609 09/08/14 1814 09/08/14 2047 09/09/14 0500  BP: 153/103  141/49 154/55  Pulse:   83 75  Temp: 99.1 F (37.3 C)  98.1 F (36.7 C) 98 F (36.7 C)  TempSrc: Oral  Oral Oral  Resp: 18  18 18   Height:      Weight:    154 lb 12.8 oz (70.217 kg)  SpO2: 96% 96% 99% 99%    Intake/Output Summary (Last 24 hours) at 09/09/14 0756 Last data filed at 09/09/14 0500  Gross per 24 hour  Intake    700 ml  Output      0 ml  Net    700 ml   Filed Weights   09/07/14 0430 09/08/14 0445 09/09/14 0500  Weight: 158 lb 15.2 oz (72.1 kg) 158 lb 9.6 oz (71.94 kg) 154 lb 12.8 oz (70.217 kg)    PHYSICAL EXAM  General: Pleasant, NAD. Neuro: Alert and oriented X 3. Moves all extremities spontaneously. Psych: Normal affect. HEENT:   Normal  Neck: Supple without bruits or JVD. Lungs:  Resp regular and unlabored. Decreased breath sound in bibasilar base, markedly diminished breath sound in R base Heart: RRR no s3, s4, or murmurs. Abdomen: Soft, non-tender, non-distended, BS + x 4.  Extremities: No clubbing, cyanosis or edema. DP/PT/Radials 2+ and equal bilaterally.  Accessory Clinical Findings  CBC  Recent Labs  09/08/14 0448 09/09/14 0506  WBC 17.5* 14.0*  NEUTROABS 10.7*  --   HGB 8.2* 8.2*  HCT 25.3* 25.8*  MCV 90.7 89.9  PLT 340 846   Basic Metabolic Panel  Recent Labs  09/08/14 0448 09/09/14 0506  NA 137 139  K 4.5 4.5  CL 101 98*  CO2 25 33*  GLUCOSE 92 98  BUN 34* 26*  CREATININE 1.19 1.30*  CALCIUM 8.0* 8.3*    TELE NSR with HR 60-70s    ECG  No new EKG  Echocardiogram 04/22/2014  LV EF: 60% -  65%  ------------------------------------------------------------------- Indications:   Dyspnea 786.09.  ------------------------------------------------------------------- History:  PMH:  Coronary artery disease. Chronic obstructive pulmonary disease. Risk factors: Former tobacco use. Hypertension.  ------------------------------------------------------------------- Study Conclusions  -  Left ventricle: The cavity size was normal. There was mild concentric hypertrophy. Systolic function was normal. The estimated ejection fraction was in the range of 60% to 65%. Wall motion was normal; there were no regional wall motion abnormalities. Doppler parameters are consistent with abnormal left ventricular relaxation (grade 1 diastolic dysfunction). Doppler parameters are consistent with high ventricular filling pressure. - Mitral valve: Moderately calcified annulus. There was mild regurgitation. - Left atrium: The atrium was mildly dilated.     Radiology/Studies  Dg Chest 2 View  09/07/2014   CLINICAL DATA:  Persistent but improving shortness of breath over  the course of her current hospitalization for right lower lobe pneumonia.  EXAM: CHEST  2 VIEW  COMPARISON:  09/06/2014 and earlier, including CT chest 08/30/2014.  FINDINGS: Cardiac silhouette normal in size. Thoracic aorta atherosclerotic, unchanged. Hilar and mediastinal contours otherwise unremarkable. Airspace consolidation in the right middle lobe and right lower lobe and moderate sized right pleural effusion, unchanged over the past 3 days, slightly improved since 5 days ago. Stable calcified granuloma in the medial left lower lobe. No new pulmonary parenchymal abnormalities. Pulmonary vascularity normal without evidence pulmonary edema. Degenerative changes and DISH involving the thoracic spine.  IMPRESSION: 1. Stable pneumonia and/or atelectasis in the right middle lobe and right lower lobe and stable moderate sized right pleural effusion. 2. No new abnormalities.   Electronically Signed   By: Evangeline Dakin M.D.   On: 09/07/2014 15:38   ASSESSMENT AND PLAN  1. Recurrent paroxysmal atrial fibrillation/flutter with RVR in the setting of severe lung dx - converted to NSR in AM of 09/06/2014 after 3.9 sec pause - CHADS2VASC score 6 (hypertension, age above 19, history of TIA, and vascular disease with prior coronary artery stents - Continue metoprolol 50mg  BID (increased on 7/1). Previously had hypotension deemed secondary to dehydration. Continue long-acting diltiazem 240 mg and digoxin0.125 mg daily.  - Eliquis held due to R lung pleural effusion, chest tubes removed last week, per pulm service, ok to resume Eliquis on 7/4 if no further R tap planned, under eval by pulm to decide whether repeat tap is needed.   - CT of chest 7/5 showed loculated R pleural effusion and RLL consolidation suggesting PNA    2. Possible acute on chronic diastolic HF vs PNA  - s/p diuresis yesterday, CT of chest showed RLL PNA. Afebrile, s/p short course of Levaquin and Vanc  - wean O2 as  tolerated  3. HCAP: management per IM  4. Parapneumonic effusion s/p chest tube  5. CAD s/p DES to LAD in 2014, also noted to have ostial RCA occlusion with good L to R collateral - continue BB and statin, plavix on hold. Not on ASA given systemic anticoagulation need  6. COPD  7. CLL followed at [facility]  8. Anemia  Signed, Almyra Deforest PA-C Pager: [phone]  As above, patient seen and examined. Had some dyspnea earlier that improved with broncho-dilators. No chest pain. Patient does not appear to be volume overloaded on examination. We'll continue low-dose Lasix. Right pneumonia/pleural effusion per pulmonary. May need repeat thoracentesis. Would resume Plavix and apixaban when no further procedures planned. In the interim, add sq heparin. Kirk Ruths

## 2014-09-09 NOTE — Progress Notes (Signed)
Hr 120s-130s, Cards notifed, additional Metoprolol order received. Etta Quill, RN

## 2014-09-09 NOTE — Consult Note (Addendum)
PHARMACY CONSULT NOTE  Pharmacy Consult :  Heparin Indication : New R-Leg DVT  Allergies: Allergies  Allergen Reactions  . Aspirin Anaphylaxis, Shortness Of Breath and Swelling  . Nsaids Anaphylaxis, Shortness Of Breath and Swelling    Raise BP  . Septra [Sulfamethoxazole-Trimethoprim] Rash  . Albuterol     Can not tolerate high/ frequent doses due to [place] with using HFA PRN  . Benicar [Olmesartan Medoxomil]     dizzy  . Salicylates     SOB  . Ampicillin Rash    Heparin Dosing weight : 70 kg  Vital Signs: BP 135/53 mmHg  Pulse 72  Temp(Src) 98.3 F (36.8 C) (Oral)  Resp 18  Ht 5\' 8"  (1.727 m)  Wt 154 lb 12.8 oz (70.217 kg)  BMI 23.54 kg/m2  SpO2 99%  Active Problems: Principal Problem:   HCAP (healthcare-associated pneumonia) Active Problems:   CAD (coronary artery disease)   Atrial fibrillation   Chronic lymphatic leukemia   Pleural effusion on right   Sepsis   Pleural effusion   Pleural effusion, right   Blood poisoning   Chest tube in place   Pleural effusion associated with pulmonary infection   SOB (shortness of breath)   Essential hypertension   Respiratory failure  Newly Diagnosed R-Leg DVT  Labs:  Recent Labs  09/07/14 0446 09/08/14 0448 09/09/14 0506  WBC 24.2* 17.5* 14.0*  HGB 8.0* 8.2* 8.2*  HCT 24.9* 25.3* 25.8*  PLT 356 340 317  CREATININE 1.43* 1.19 1.30*  Estimated Creatinine Clearance: 37.3 mL/min (by C-G formula based on Cr of 1.3).  Medical / Surgical History: Past Medical History  Diagnosis Date  . Osteopenia 2006  . Hyperlipidemia   . BPH (benign prostatic hypertrophy)   . Transient global amnesia   . Osteoarthritis   . History of TIA (transient ischemic attack) 1980'S    NO RESIDUAL  . COPD (chronic obstructive pulmonary disease) with emphysema   . Chronic back pain   . Itching SECONDARY TO CLL-- CONTROLLED W/ SINGULAIR  . Frequency   . Nocturia   . Hypertension CARDIOLOGIST- DR BRACKBILL-- LAST VISIT  12-15-2010 NOTE IN EPIC  . CLL (chronic lymphocytic leukemia) LAST PLT COUNT APRL 2012  168    Managed at [facility]  . Bladder cancer   . Aspirin allergy     Eyes swell  . Asthma   . Dyspnea on exertion   . CAD (coronary artery disease)     a. 08/2012 Cath: LM nl, LAD 90p, LCX 20-30, RCA 100ost fills via L->R collats, EF 55-65%;  b. 09/2012 PCI of prox LAD with 3.0x16 Promus DES.   Past Surgical History  Procedure Laterality Date  . Excisional hemorrhoidectomy  1980  . Cardiovascular stress test  07/2005    a. 07/2005- no reversible ischemia, normal EF b. 06/2009- EF 73%, no reversible ischemia, inferolateral TWIs at rest, upright in recovery c. 08/2012- mediuim-sized partially reversible basal to mid inferior and inferoseptal perfusion defect c/w with prior infarction and peri-infarct ischemia; EF 68% and borderline ST/T changes on stress ECG   . Transurethral resection of bladder tumor  10-19-2008    AND DILATION URETHRAL STRICTURE  . Cataract extraction w/ intraocular lens  implant, bilateral Bilateral ~ 2007  . Transthoracic echocardiogram  01-03-2011    LVEF 85-46%, grade 1 diastolic dysfunction, no WMAs or structural abnormalities  . Cystoscopy with biopsy  10/30/2011    Procedure: CYSTOSCOPY WITH BIOPSY;  Surgeon: Elta Guadeloupe  Nedra Hai, MD;  Location: [facility];  Service: Urology;  Laterality: N/A;  . Cardiac catheterization  08/27/2012  . Coronary angioplasty with stent placement  09/26/2012    "1" (09/26/2012)  . Coronary stent placement  09/26/12  . Left heart catheterization with coronary angiogram N/A 08/27/2012    Procedure: LEFT HEART CATHETERIZATION WITH CORONARY ANGIOGRAM;  Surgeon: Peter M Martinique, MD;  Location: [facility] CATH LAB;  Service: Cardiovascular;  Laterality: N/A;  . Percutaneous coronary stent intervention (pci-s) N/A 09/26/2012    Procedure: PERCUTANEOUS CORONARY STENT INTERVENTION (PCI-S);  Surgeon: Peter M Martinique, MD;  Location: [facility] CATH LAB;   Service: Cardiovascular;  Laterality: N/A;    MEDICATION: Medication PTA: Facility-administered medications prior to admission  Medication Dose Route Frequency Provider Last Rate Last Dose  . testosterone cypionate (DEPOTESTOTERONE CYPIONATE) injection 200 mg  200 mg Intramuscular Q28 days Chipper Herb, MD   200 mg at 07/17/14 1130   Prescriptions prior to admission  Medication Sig Dispense Refill Last Dose  . albuterol (VENTOLIN HFA) 108 (90 BASE) MCG/ACT inhaler Inhale 2 puffs into the lungs every 6 (six) hours as needed for wheezing.    08/24/2014 at Unknown time  . Azelastine HCl (ASTELIN NA) Place 1 spray into the nose as needed (for congestion).    08/24/2014 at Unknown time  . Cholecalciferol (VITAMIN D3) 1000 UNITS CAPS Take 1 capsule by mouth daily.    08/24/2014 at Unknown time  . cimetidine (TAGAMET) 300 MG tablet TAKE 1 TABLET UP TO 4 TIMES A DAY (Patient taking differently: TAKE 1 TABLET UP TO 4 TIMES A DAY as needed) 40 tablet 11 08/24/2014 at Unknown time  . clopidogrel (PLAVIX) 75 MG tablet TAKE 1 TABLET ONCE A DAY 90 tablet 1 08/24/2014 at Unknown time  . diltiazem (CARDIZEM CD) 180 MG 24 hr capsule Take 1 capsule (180 mg total) by mouth daily. 30 capsule 1 08/24/2014 at Unknown time  . diphenhydramine-acetaminophen (TYLENOL PM) 25-500 MG TABS Take 1 tablet by mouth at bedtime as needed. sleep   08/24/2014 at Unknown time  . ELIQUIS 5 MG TABS tablet TAKE  (1)  TABLET TWICE A DAY. 180 tablet 0 08/24/2014 at Unknown time  . fluticasone (FLONASE) 50 MCG/ACT nasal spray USE 2 SPRAYS INTO EACH NOSTRIL ONCE DAILY (Patient taking differently: USE 2 SPRAYS INTO EACH NOSTRIL ONCE DAILY as needed for allergies.) 16 g 2 08/24/2014 at Unknown time  . furosemide (LASIX) 20 MG tablet TAKE (1) TABLET DAILY AS DIRECTED. (Patient taking differently: TAKE (1) TABLET DAILY AS DIRECTED as needed for swelling) 30 tablet 2 08/24/2014 at Unknown time  . LIPITOR 10 MG tablet TAKE 1 TABLET DAILY (Patient taking  differently: TAKE 1 TABLET DAILY in the evening.) 90 tablet 1 08/24/2014 at Unknown time  . losartan (COZAAR) 50 MG tablet TAKE (1) TABLET TWICE A DAY. 180 tablet 1 08/24/2014 at Unknown time  . metoprolol tartrate (LOPRESSOR) 25 MG tablet Take 1 tablet (25 mg total) by mouth 2 (two) times daily. 180 tablet 0 08/24/2014 at 1800  . montelukast (SINGULAIR) 10 MG tablet TAKE ONE TABLET AT BEDTIME 90 tablet 2 08/24/2014 at Unknown time  . nitroGLYCERIN (NITROSTAT) 0.4 MG SL tablet Place 1 tablet (0.4 mg total) under the tongue every 5 (five) minutes as needed for chest pain. 25 tablet 3 Past Month at Unknown time  . Omega-3 Fatty Acids (FISH OIL) 1200 MG CAPS Take 1 capsule by mouth daily.    08/24/2014 at Unknown time  .  SPIRIVA HANDIHALER 18 MCG inhalation capsule INHALE THE CONTENTS OF ONE CAPSULE ONCE DAILY AS DIRECTED 30 capsule 4 08/25/2014 at Unknown time  . SYMBICORT 160-4.5 MCG/ACT inhaler 2 PUFFS EVERY 12 HOURS 10.2 g 3 08/25/2014 at Unknown time  . traMADol (ULTRAM) 50 MG tablet TAKE 1 TABLET UP TO 2 TIMES A DAY 60 tablet 0 08/24/2014 at Unknown time  . triamcinolone cream (KENALOG) 0.1 % APPLY TO AFFECTED AREAS 2 TIMES A DAY AS NEEDED 45 g 3 08/24/2014 at Unknown time  . cefUROXime (CEFTIN) 250 MG tablet Take 1 tablet (250 mg total) by mouth 2 (two) times daily with a meal. (Patient not taking: Reported on 08/25/2014) 8 tablet 0 Completed Course at Unknown time    Scheduled:  Scheduled:  . arformoterol  15 mcg Nebulization BID  . atorvastatin  10 mg Oral q1800  . budesonide (PULMICORT) nebulizer solution  0.5 mg Nebulization BID  . calcium carbonate  1 tablet Oral BID  . diltiazem  240 mg Oral Daily  . feeding supplement (ENSURE ENLIVE)  237 mL Oral TID BM  . furosemide  20 mg Oral Daily  . LORazepam  0.5 mg Oral Once  . methylPREDNISolone (SOLU-MEDROL) injection  40 mg Intravenous Q6H  . metoprolol tartrate  50 mg Oral BID  . montelukast  10 mg Oral QHS  . polyethylene glycol  17 g Oral BID   . senna-docusate  1 tablet Oral BID  . senna-docusate  1 tablet Oral QHS   Infusion[s]: Infusions:  . sodium chloride 10 mL/hr at 09/01/14 1204  . heparin  to be started at 1100 units/hr   ASSESSMENT:  81 y.o. male is to be started on a Heparin infusioon for a newly discovered R-Leg DVT.  Patient received Heparin 5000 units sq at 1538 pm as part of his VTE prophylaxis regimen.  SQ Heparin now Discontinued.  GOAL:  Heparin Level  0.3 - 0.7 units/ml    PLAN: 1. Begin Heparin infusion, no bolus as he has already received SQ bolus earlier, at 1100 units/hr.  The first Heparin Level will be in 8 hours.    2. Daily Heparin level, CBC while on Heparin.  Monitor for bleeding complications. Follow Platelet counts.  Marthenia Rolling,  Pharm.D   09/09/2014,  4:59 PM.

## 2014-09-09 NOTE — Progress Notes (Signed)
Unfortunately patient went back into a-fib with RVR around 16:18pm of 09/09/2014. Continue to be tachycardic after 2 dose of IV lopressor. He is pending 50mg  PO metoprolol around 10pm, he also received 50mg  PO metoprolol along with 240mg  PO diltiazem this morning. His current SBP 130s, i will give additional 120mg  long acting diltiazem tonight. Hopefully once the night dose of PO metoprolol is in, his HR will be better controlled.  Hilbert Corrigan PA Pager: [phone]

## 2014-09-09 NOTE — Progress Notes (Signed)
Preliminary results by tech - Bilateral Venous Duplex Completed. Positive for deep vein thrombosis involving one of the paired right posterior tibial veins in the mid calf. No evidence of deep or superficial vein thrombosis in the left leg. Results given to patient's nurse at 4:05 pm. Oda Cogan, BS, RDMS, RVT

## 2014-09-09 NOTE — Progress Notes (Signed)
[facility] PROGRESS NOTE  Johnathan Strong OIN:[id number] DOB: Feb 21, 1924 DOA: 08/25/2014 PCP: Redge Gainer, MD  Interim Summary  89-CML, recently discharged from the medicine service on 08/19/2014, admitted on 08/09/2014 during which he was treated for community-acquired pneumonia. Patient showed gradual improvement as he was weaned off of supplemental oxygen, tolerating by mouth intake, and was discharged on Ceftin.  His wife reporting that patient did well the week after discharge, got back to his normal routine, then had a decline  . Chest x-ray performed on admission revealed moderate right pleural effusion with associated right lung base consolidation/collapse.   He was started on broad-spectrum IV antibiotic therapy with vancomycin and ceftazidime, undergoing ultrasound-guided thoracentesis by interventional radiology on 08/26/2014.  Pulmonary critical care medicine was consulted regarding pleural effusion as it was felt he would benefit chest tube.  PCCM placed a 32 French chest tube on 08/27/2014 (rather than wayne cath) since there was marked fibrinous tissue and concern for loculation. Subsequent imagining showed incomplete drainage for which TPA was injected into pleural space. This seemed to help initially however subsequent injections did not yield significant fluid. PCCM recommended proceeding with IR consult for PIG-tail catheter, which was performed on 09/01/2014. Hospitalization was complicated by the moment of A. fib with RVR on 08/27/2014 likely precipitated by underlying pulmonary infectious process and acute hypoxemic respiratory failure. Controlling Afib has been a challenge. Dr Coralyn Pear consulted cardiology on 09/01/2014.  Patient was followed by cardiology and pulmonary during the hospitalization.  Patient had issues with A. fib with RVR and being hypotensive and a such difficult to titrate his rate controlling medications.and digoxin added . Chest tubes were subsequently  removed. Patient subsequently converted back into normal sinus rhythm. Blood pressure improved. And heart rate is remained controlled. P atient with some bouts of shortness of breath felt to be secondary to possible volume overload.  Repeat chest x-ray with no acute abnormalities.  Patient's been given some IV Lasix and will be transitioned to oral Lasix on 09/09/2014.  Patient was also received 2 weeks of antibiotic treatment.  Pulmonary and critical care as well as cardiology following.   Assessment/Plan: #1 healthcare associated pneumonia with probable parapneumonic effusion Patient recently discharged on 08/19/2014 from the hospital after being treated for community-acquired pneumonia who presented with a probable parapneumonic effusion. Patient underwent ultrasound-guided thoracentesis by interventional radiology with a liter of yellow colored fluid removed with a cell count of 10,299.  Thoracentesis was consistent with exudative characteristics.  No organisms seen on Gram stain. Blood cultures pending. Afebrile. WBC trending down.  Patient also had chest tube placed on 08/27/2014 with 1200 mL output in all 625 and 626 minimal output.  On 09/01/2014 posterior 12 French chest tube was placed per IR. 09/02/2014 patient had right lateral chest tube removed per critical care. Patient is afebrile. WBC trending down. Treatment options were discussed   drain catheter versus repeat thoracentesis.  VATS procedure was felt not to be well tolerated due to his multiple comorbidities, advanced age, A. fib with RVR. IV Tressie Ellis has been changed to oral levaquin. Pulmonary medicine gave TPA injections into pleural space due to amount of fibrous material and incomplete fluid drainage from the chest tube.  Patient did have a repeat chest CT that showed interval improvement in right-sided pleural effusion.  Patient was evaluated by Dr. Roxan Hockey of cardiothoracic surgery who did not recommend a VATS  procedure.  Patient had pigtail catheter placement done on 09/01/2014 per interventional radiology.  Second chest tube was  removed.  Repeat chest x-ray done yesterday morning with no interval change in the chest.  Persistent right basilar collapse/consolidation and effusion. Clinical improvement.  -Rpt CXR and CBC am -Consideration in am for Rpt Chest tube  RLE DVT -found on Doppler LE's on 7/6 -IV heparin started CT angio non suggestive PE -lifelong Coumadin as DVt + Afib  #2 acute hypoxemic respiratory failure Likely multifactorial secondary to problem #1. Improving slowly.  Patient with some shortness of breath on ambulation to the bathroom without his oxygen.  Repeat chest x-ray with no significant new changes.  Patient was given a dose of Lasix 20 mg IV 1 as well as nebs as needed.  Chest x-ray repeated.  Patient will be given another dose of IV Lasix this afternoon and then to start oral Lasix 20 mg daily tomorrow.  Patient is status post 2 weeks of antibiotics.  desat screen reveals need sOxygen on d/c  #3 A. fib with RVR CHADS2VASC score 6. Patient was noted to have heart rates in the 140s 4 nights ago.  Patient has converted back into normal sinus rhythm.  2/2 patient's pulmonary process.  Patient has been seen in consultation by cardiology were adjusting patient's rate controlling medications.  Patient blood pressure with some improvement.  Metoprolol dose has been increased to 50 mg twice daily per cardiology 7/1 Diltiazem has been decreased to 240 mg daily per cardiology as patient was noted to be dizzy and hypotensive earlier Continue Digoxin 0.125  Per cardiology.  Pulmonary  recommendeds to possibly resume anticoagulation 09/07/2014.   #4 coronary artery disease status post DES to the LAD in 2014 Stable. Continue beta blocker, statin. Plavix on hold. Per cardiology.  Start IV heparin as above  #5 acute on chronic diastolic heart failure Patient noted to have  shortness of breath early on this morning.  . Patient given Lasix 20 mg IV 1 this morning.  Another dose of IV Lasix ordered this afternoon per cardiology.  Patient to be started on 20 mg of Lasix tomorrow. Per cardiology. Follow.  #6 sepsis Noted on admission as patient had a white count of 24.2, respiratory rate of 33 heart rate of 107 with source of infection being healthcare associated pneumonia. Blood cultures pending with no growth to date. Pleural fluid cultures pending. Patient status post 2 weeks of empiric antibiotics.   #7 hypertension Due to hypotension, Cardizem dose was decreased back to his home regimen. Metoprolol dose has been increased slightly to 50 mg twice a day. Cozaar on hold.   #8 acute kidney injury Improvement. Stable. Follow.  #9 leukocytosis WBC24->17>14 today Patient is afebrile. Patient denies any diarrhea. Patient denies any dysuria.  Repeat chest x-ray done yesterday with stable pneumonia and/or atelectasis in the right middle lobe and right lower lobe and stable moderate size right pleural effusion.  No new abnormalities.  Patient status post approximately 2 weeks of antibiotics which have been discontinued.  Patient is currently afebrile.  No further antibiotics needed at this time.   #10 CLL Outpatient follow-up.  #11 prophylaxis SCDs for DVT prophylaxis.  Code Status: Full Family Communication: Updated patient, wife at bedside. Disposition Plan: Home with home health when medically stable.  Consultants:  Cardiology: Dr. Haroldine Laws 09/01/2014  CT surgery Dr. Roxan Hockey 08/28/2014  Critical care medicine Dr. Alva Garnet 08/26/2014  Procedures:  Ultrasound-guided thoracentesis 08/26/2014  CT guided drainage catheter placement into caudal aspect of right pleural space per Dr. Pascal Lux 09/01/2014  Right chest tube placement 08/27/2014  CT chest  08/30/2014, 08/25/2014  Right Chest tube removal per interventional radiology  09/04/2014  Significant tests/ events 6/23 A. fib RVR- transferred to stepdown unit 6/23 [place] of right chest - marked fibrinous tissue and concern for loculation t/o the right chest wall Rt chest tube 6/23 >>TPA 6/24: 1200 ml output, on 6/25 and 6/26 minimal output.  CT chest 6/26: Interval improvement in a moderate size right pleural fluid collection with associated compressive atelectasis over the right middle and lower lobes. Right-sided chest tube in with tip over the posterior pleural space in the most superior aspect of the pleural fluid collection. 6/28: posterior 12 fr chest tube placed in IR  6/29: right lateral 32 fr CT removed.  7/1 posterior chest tube removed  Antibiotics:  IV Tressie Ellis 08/25/2014>>>>>>09/03/14  IV vancomycin 08/25/2014>>>> 09/01/2014  Oral levaquin 09/03/14>>>>> 09/06/2014  HPI/Subjective:   SOB earlier today Pulmonary has seen No CP ON review in pm he seemed a little better Long discussion about the RLE DVT and him and family understand  Objective: Filed Vitals:   09/09/14 1510  BP: 135/53  Pulse: 72  Temp: 98.3 F (36.8 C)  Resp: 18    Intake/Output Summary (Last 24 hours) at 09/09/14 1624 Last data filed at 09/09/14 0820  Gross per 24 hour  Intake    700 ml  Output      0 ml  Net    700 ml   Filed Weights   09/07/14 0430 09/08/14 0445 09/09/14 0500  Weight: 72.1 kg (158 lb 15.2 oz) 71.94 kg (158 lb 9.6 oz) 70.217 kg (154 lb 12.8 oz)    Exam:   General:  NAD  Cardiovascular: RRR  Respiratory: Decreased breath sounds in the right base, otherwise clear. Right chest tube has been removed. Pigtail catheter in place.  Abdomen: Soft, nontender, nondistended, positive bowel sounds.  Musculoskeletal: No clubbing cyanosis or edema.  Data Reviewed: Basic Metabolic Panel:  Recent Labs Lab 09/03/14 0453  09/05/14 0458 09/06/14 0416 09/07/14 0446 09/08/14 0448 09/09/14 0506  NA 132*  < > 132* 135 135 137 139  K 4.5  < > 5.1  4.5 4.7 4.5 4.5  CL 103  < > 98* 100* 99* 101 98*  CO2 24  < > 25 28 29 25  33*  GLUCOSE 107*  < > 105* 106* 136* 92 98  BUN 28*  < > 25* 26* 33* 34* 26*  CREATININE 1.22  < > 1.22 1.28* 1.43* 1.19 1.30*  CALCIUM 8.0*  < > 8.0* 8.0* 8.2* 8.0* 8.3*  MG 2.0  --   --   --   --   --   --   < > = values in this interval not displayed. Liver Function Tests: No results for input(s): AST, ALT, ALKPHOS, BILITOT, PROT, ALBUMIN in the last 168 hours. No results for input(s): LIPASE, AMYLASE in the last 168 hours. No results for input(s): AMMONIA in the last 168 hours. CBC:  Recent Labs Lab 09/05/14 0458 09/06/14 0416 09/07/14 0446 09/08/14 0448 09/09/14 0506  WBC 17.4* 18.5* 24.2* 17.5* 14.0*  NEUTROABS  --   --   --  10.7*  --   HGB 8.4* 7.9* 8.0* 8.2* 8.2*  HCT 26.0* 24.4* 24.9* 25.3* 25.8*  MCV 88.7 89.4 88.6 90.7 89.9  PLT 325 336 356 340 317   Cardiac Enzymes: No results for input(s): CKTOTAL, CKMB, CKMBINDEX, TROPONINI in the last 168 hours. BNP (last 3 results)  Recent Labs  04/21/14 1541 08/25/14 0700  BNP  484.0* 224.5*    ProBNP (last 3 results) No results for input(s): PROBNP in the last 8760 hours.  CBG: No results for input(s): GLUCAP in the last 168 hours.  Recent Results (from the past 240 hour(s))  Culture, body fluid-bottle     Status: None   Collection Time: 09/01/14  3:45 PM  Result Value Ref Range Status   Specimen Description PLEURAL FLUID  Final   Special Requests NONE  Final   Culture NO GROWTH 5 DAYS  Final   Report Status 09/06/2014 FINAL  Final  Gram stain     Status: None   Collection Time: 09/01/14  3:45 PM  Result Value Ref Range Status   Specimen Description PLEURAL FLUID  Final   Special Requests NONE  Final   Gram Stain   Final    ABUNDANT WBC PRESENT,BOTH PMN AND MONONUCLEAR NO ORGANISMS SEEN    Report Status 09/02/2014 FINAL  Final     Studies: Ct Angio Chest Pe W/cm &/or Wo Cm  09/08/2014   CLINICAL DATA:  Dyspnea this morning,  acute respiratory failure  EXAM: CT ANGIOGRAPHY CHEST WITH CONTRAST  TECHNIQUE: Multidetector CT imaging of the chest was performed using the standard protocol during bolus administration of intravenous contrast. Multiplanar CT image reconstructions and MIPs were obtained to evaluate the vascular anatomy.  CONTRAST:  151mL OMNIPAQUE IOHEXOL 350 MG/ML SOLN  COMPARISON:  08/30/2014  FINDINGS: Left arm contrast injection. SVC is patent. Right ventricle is nondilated. Mild dilatation of central pulmonary arteries. Satisfactory opacification of pulmonary arteries noted, and there is no evidence of pulmonary emboli. Patent bilateral pulmonary veins. Extensive coronary calcifications. Adequate contrast opacification of the thoracic aorta with no evidence of dissection, aneurysm, or stenosis. There is classic 3-vessel brachiocephalic arch anatomy without proximal stenosis. Moderate plaque in the aortic arch and descending aorta.  Small left and moderate right pleural effusions. There is some pleural enhancement on the right with the loculation posteriorly at the right lung base and multiple gas bubbles within the pleural space. There is a small locule of gas in the pleural space anteriorly, and a small gas bubble in the subcutaneous tissues in the right lateral chest wall probably from previous chest tube placement. Calcified granuloma in the left lower lobe. No pericardial effusion. Borderline enlarged left hilar lymph nodes. 12 mm right suprahilar lymph node. Subcentimeter subcarinal and AP window lymph nodes.  Consolidation/atelectasis posteriorly at the right lung base. Minimal dependent atelectasis posteriorly in the left lower lobe. Bridging osteophytes across multiple contiguous levels in the thoracic spine. Spondylitic changes in the visualized lower cervical spine. Sternum is intact. Visualized portions of upper abdomen unremarkable.  Review of the MIP images confirms the above findings.  IMPRESSION: 1. Negative  for acute PE or thoracic aortic dissection. 2. Moderate residual loculated right pleural effusion with some pleural enhancement suggesting possible empyema. 3. Adjacent consolidation in the right lower lobe suggesting pneumonia. 4. Atherosclerosis, including aortic and coronary artery disease. Please note that although the presence of coronary artery calcium documents the presence of coronary artery disease, the severity of this disease and any potential stenosis cannot be assessed on this non-gated CT examination. Assessment for potential risk factor modification, dietary therapy or pharmacologic therapy may be warranted, if clinically indicated.   Electronically Signed   By: Lucrezia Europe M.D.   On: 09/08/2014 14:07   Dg Chest Port 1 View  09/08/2014   CLINICAL DATA:  Dyspnea and shortness of breath with onset this morning ; history  of COPD, leukemia, bladder malignancy, coronary artery disease  EXAM: PORTABLE CHEST - 1 VIEW  COMPARISON:  PA and lateral chest of September 07, 2014  FINDINGS: The left lung is well-expanded and clear. On the right there is persistent increased density in the mid and lower lung with partial obscuration of the hemidiaphragm. The heart is normal in size. The pulmonary vascularity is not engorged. The observed bony thorax is unremarkable.  IMPRESSION: Persistent increased interstitial and alveolar density in the right mid and lower lung consistent with atelectasis or pneumonia. The known small right pleural effusion is not clearly evident on this portable view.   Electronically Signed   By: David  Martinique M.D.   On: 09/08/2014 10:10    Scheduled Meds: . arformoterol  15 mcg Nebulization BID  . atorvastatin  10 mg Oral q1800  . budesonide (PULMICORT) nebulizer solution  0.5 mg Nebulization BID  . calcium carbonate  1 tablet Oral BID  . diltiazem  240 mg Oral Daily  . feeding supplement (ENSURE ENLIVE)  237 mL Oral TID BM  . furosemide  20 mg Oral Daily  . heparin subcutaneous  5,000  Units Subcutaneous 3 times per day  . LORazepam  0.5 mg Oral Once  . methylPREDNISolone (SOLU-MEDROL) injection  40 mg Intravenous Q6H  . metoprolol tartrate  50 mg Oral BID  . montelukast  10 mg Oral QHS  . polyethylene glycol  17 g Oral BID  . senna-docusate  1 tablet Oral BID  . senna-docusate  1 tablet Oral QHS   Continuous Infusions: . sodium chloride 10 mL/hr at 09/01/14 1204    Principal Problem:   HCAP (healthcare-associated pneumonia) Active Problems:   CAD (coronary artery disease)   Atrial fibrillation   Chronic lymphatic leukemia   Pleural effusion on right   Sepsis   Pleural effusion   Pleural effusion, right   Blood poisoning   Chest tube in place   Pleural effusion associated with pulmonary infection   SOB (shortness of breath)   Essential hypertension   Respiratory failure    Time spent: 35 minutes Verneita Griffes, MD [facility] (P) [phone]

## 2014-09-09 NOTE — Progress Notes (Addendum)
PCCM NOTE  Subjective: He is still short of breath, but better than yesterday.  He has some wheezing and chest congestion.  He felt better after BD treatment.  Objective: BP 154/55 mmHg  Pulse 75  Temp(Src) 98 F (36.7 C) (Oral)  Resp 18  Ht 5\' 8"  (1.727 m)  Wt 154 lb 12.8 oz (70.217 kg)  BMI 23.54 kg/m2  SpO2 99%  General: mild increase WOB HEENT: no sinus tenderness Cardiac: regular Chest: faint b/l wheeze, decreased bs Rt base Abd: soft, non tender Ext: no edema Neuro: normal strength   CMP Latest Ref Rng 09/09/2014 09/08/2014 09/07/2014  Glucose 65 - 99 mg/dL 98 92 136(H)  BUN 6 - 20 mg/dL 26(H) 34(H) 33(H)  Creatinine 0.61 - 1.24 mg/dL 1.30(H) 1.19 1.43(H)  Sodium 135 - 145 mmol/L 139 137 135  Potassium 3.5 - 5.1 mmol/L 4.5 4.5 4.7  Chloride 101 - 111 mmol/L 98(L) 101 99(L)  CO2 22 - 32 mmol/L 33(H) 25 29  Calcium 8.9 - 10.3 mg/dL 8.3(L) 8.0(L) 8.2(L)  Total Protein 6.5 - 8.1 g/dL - - -  Albumin 3.5 - 4.7 g/dL - - -  Total Bilirubin 0.3 - 1.2 mg/dL - - -  Alkaline Phos 38 - 126 U/L - - -  AST 15 - 41 U/L - - -  ALT 17 - 63 U/L - - -    CBC Latest Ref Rng 09/09/2014 09/08/2014 09/07/2014  WBC 4.0 - 10.5 K/uL 14.0(H) 17.5(H) 24.2(H)  Hemoglobin 13.0 - 17.0 g/dL 8.2(L) 8.2(L) 8.0(L)  Hematocrit 39.0 - 52.0 % 25.8(L) 25.3(L) 24.9(L)  Platelets 150 - 400 K/uL 317 340 356    Dg Chest 2 View  09/07/2014   CLINICAL DATA:  Persistent but improving shortness of breath over the course of her current hospitalization for right lower lobe pneumonia.  EXAM: CHEST  2 VIEW  COMPARISON:  09/06/2014 and earlier, including CT chest 08/30/2014.  FINDINGS: Cardiac silhouette normal in size. Thoracic aorta atherosclerotic, unchanged. Hilar and mediastinal contours otherwise unremarkable. Airspace consolidation in the right middle lobe and right lower lobe and moderate sized right pleural effusion, unchanged over the past 3 days, slightly improved since 5 days ago. Stable calcified granuloma in the  medial left lower lobe. No new pulmonary parenchymal abnormalities. Pulmonary vascularity normal without evidence pulmonary edema. Degenerative changes and DISH involving the thoracic spine.  IMPRESSION: 1. Stable pneumonia and/or atelectasis in the right middle lobe and right lower lobe and stable moderate sized right pleural effusion. 2. No new abnormalities.   Electronically Signed   By: Evangeline Dakin M.D.   On: 09/07/2014 15:38   Ct Angio Chest Pe W/cm &/or Wo Cm  09/08/2014   CLINICAL DATA:  Dyspnea this morning, acute respiratory failure  EXAM: CT ANGIOGRAPHY CHEST WITH CONTRAST  TECHNIQUE: Multidetector CT imaging of the chest was performed using the standard protocol during bolus administration of intravenous contrast. Multiplanar CT image reconstructions and MIPs were obtained to evaluate the vascular anatomy.  CONTRAST:  188mL OMNIPAQUE IOHEXOL 350 MG/ML SOLN  COMPARISON:  08/30/2014  FINDINGS: Left arm contrast injection. SVC is patent. Right ventricle is nondilated. Mild dilatation of central pulmonary arteries. Satisfactory opacification of pulmonary arteries noted, and there is no evidence of pulmonary emboli. Patent bilateral pulmonary veins. Extensive coronary calcifications. Adequate contrast opacification of the thoracic aorta with no evidence of dissection, aneurysm, or stenosis. There is classic 3-vessel brachiocephalic arch anatomy without proximal stenosis. Moderate plaque in the aortic arch and descending aorta.  Small  left and moderate right pleural effusions. There is some pleural enhancement on the right with the loculation posteriorly at the right lung base and multiple gas bubbles within the pleural space. There is a small locule of gas in the pleural space anteriorly, and a small gas bubble in the subcutaneous tissues in the right lateral chest wall probably from previous chest tube placement. Calcified granuloma in the left lower lobe. No pericardial effusion. Borderline enlarged  left hilar lymph nodes. 12 mm right suprahilar lymph node. Subcentimeter subcarinal and AP window lymph nodes.  Consolidation/atelectasis posteriorly at the right lung base. Minimal dependent atelectasis posteriorly in the left lower lobe. Bridging osteophytes across multiple contiguous levels in the thoracic spine. Spondylitic changes in the visualized lower cervical spine. Sternum is intact. Visualized portions of upper abdomen unremarkable.  Review of the MIP images confirms the above findings.  IMPRESSION: 1. Negative for acute PE or thoracic aortic dissection. 2. Moderate residual loculated right pleural effusion with some pleural enhancement suggesting possible empyema. 3. Adjacent consolidation in the right lower lobe suggesting pneumonia. 4. Atherosclerosis, including aortic and coronary artery disease. Please note that although the presence of coronary artery calcium documents the presence of coronary artery disease, the severity of this disease and any potential stenosis cannot be assessed on this non-gated CT examination. Assessment for potential risk factor modification, dietary therapy or pharmacologic therapy may be warranted, if clinically indicated.   Electronically Signed   By: Lucrezia Europe M.D.   On: 09/08/2014 14:07   Dg Chest Port 1 View  09/08/2014   CLINICAL DATA:  Dyspnea and shortness of breath with onset this morning ; history of COPD, leukemia, bladder malignancy, coronary artery disease  EXAM: PORTABLE CHEST - 1 VIEW  COMPARISON:  PA and lateral chest of September 07, 2014  FINDINGS: The left lung is well-expanded and clear. On the right there is persistent increased density in the mid and lower lung with partial obscuration of the hemidiaphragm. The heart is normal in size. The pulmonary vascularity is not engorged. The observed bony thorax is unremarkable.  IMPRESSION: Persistent increased interstitial and alveolar density in the right mid and lower lung consistent with atelectasis or  pneumonia. The known small right pleural effusion is not clearly evident on this portable view.   Electronically Signed   By: David  Martinique M.D.   On: 09/08/2014 10:10     Events: 6/21 Admit 6/22 Rt thoracentesis 6/23 Pig tail catheter placed by DF 6/24 TCTS consulted >> poor candidate for surgery; tPA into chest tube with 1300 ml fluid out 6/25 tPA into chest tube >> not much fluid out 6/26 tPA into chest tube >> not much fluid out 6/28 IR placed catheter in Rt pleural space 7/01 Chest tube removed  Studies: 6/22 Rt Pleural fluid >> 1 liter, LDH 341, protein 3.7, WBC 10,299 (93% N) 6/30 Rt Pleural fluid >> LDH 3452, WBC 1930 (97% N) 7/05 CT chest >> mod Rt pleural effusion with loculation, consolidation Rt base  Discussion: 81 yo male with fever, chills, night sweats from HCAP and parapneumonic effusion.  He also has COPD, A fib, and diastolic heart failure.  He had worsening dyspnea on 7/05.  CT chest shows persistence of Rt pleural effusion.  He reports his symptoms improved with BD therapy, and he has b/l wheezing on exam 7/06.  Assessment/Plan:  COPD exacerbation. Plan: - add pulmicort, brovana, and prn xopenex - add solumedrol  HCAP with Rt parapneumonic pleural effusion. Not candidate for surgery.  Not sure his current symptoms are related to progression of effusion.  Cx's negative. Plan: - Off Abx since 7/03 - f/u 2 view CXR 7/07 >> if fluid progresses, then would ask IR to re-assess placement of another drain  Acute hypoxic respiratory failure. Plan: - continue oxygen to keep SpO2 90 to 95%  A fib, acute on chronic diastolic CHF, CAD s/p stent. Plan: - per primary team and cardiology  Hx of CLL. Plan: - f/u CBC   Updated pt's daughter at bedside.  Chesley Mires, MD [facility] Pulmonary/Critical Care 09/09/2014, 10:10 AM Pager:  [phone] After 3pm call: [phone]   Spoke with pt's son Liborio Saccente [phone]).  Explained current plan for AECOPD and  monitor pleural effusion.  Also explained that doppler leg shows DVT, but CT chest was negative for PE.    Defer anticoagulation to primary team.  Chesley Mires, MD [place] 09/09/2014, 6:01 PM Pager:  [phone] After 3pm call: [phone]

## 2014-09-10 ENCOUNTER — Inpatient Hospital Stay (HOSPITAL_COMMUNITY): Payer: Medicare Other

## 2014-09-10 DIAGNOSIS — R0600 Dyspnea, unspecified: Secondary | ICD-10-CM | POA: Insufficient documentation

## 2014-09-10 LAB — RENAL FUNCTION PANEL
ALBUMIN: 2.3 g/dL — AB (ref 3.5–5.0)
ANION GAP: 11 (ref 5–15)
BUN: 27 mg/dL — ABNORMAL HIGH (ref 6–20)
CALCIUM: 8.3 mg/dL — AB (ref 8.9–10.3)
CO2: 30 mmol/L (ref 22–32)
Chloride: 93 mmol/L — ABNORMAL LOW (ref 101–111)
Creatinine, Ser: 1.34 mg/dL — ABNORMAL HIGH (ref 0.61–1.24)
GFR calc non Af Amer: 45 mL/min — ABNORMAL LOW (ref >60)
GFR, EST AFRICAN AMERICAN: 52 mL/min — AB (ref >60)
Glucose, Bld: 225 mg/dL — ABNORMAL HIGH (ref 65–99)
PHOSPHORUS: 3.1 mg/dL (ref 2.5–4.6)
Potassium: 4.5 mmol/L (ref 3.5–5.1)
SODIUM: 134 mmol/L — AB (ref 135–145)

## 2014-09-10 LAB — CBC WITH DIFFERENTIAL/PLATELET
Basophils Absolute: 0 10*3/uL (ref 0.0–0.1)
Basophils Relative: 0 % (ref 0–1)
Eosinophils Absolute: 0 10*3/uL (ref 0.0–0.7)
Eosinophils Relative: 0 % (ref 0–5)
HCT: 25.1 % — ABNORMAL LOW (ref 39.0–52.0)
HEMOGLOBIN: 8.1 g/dL — AB (ref 13.0–17.0)
Lymphocytes Relative: 44 % (ref 12–46)
Lymphs Abs: 5 10*3/uL — ABNORMAL HIGH (ref 0.7–4.0)
MCH: 29.1 pg (ref 26.0–34.0)
MCHC: 32.3 g/dL (ref 30.0–36.0)
MCV: 90.3 fL (ref 78.0–100.0)
Monocytes Absolute: 0.2 10*3/uL (ref 0.1–1.0)
Monocytes Relative: 2 % — ABNORMAL LOW (ref 3–12)
Neutro Abs: 6.2 10*3/uL (ref 1.7–7.7)
Neutrophils Relative %: 54 % (ref 43–77)
Platelets: 291 10*3/uL (ref 150–400)
RBC: 2.78 MIL/uL — AB (ref 4.22–5.81)
RDW: 22.1 % — ABNORMAL HIGH (ref 11.5–15.5)
WBC: 11.4 10*3/uL — AB (ref 4.0–10.5)

## 2014-09-10 LAB — PROTIME-INR
INR: 1.33 (ref 0.00–1.49)
Prothrombin Time: 16.6 seconds — ABNORMAL HIGH (ref 11.6–15.2)

## 2014-09-10 LAB — HEPARIN LEVEL (UNFRACTIONATED)
Heparin Unfractionated: 0.31 IU/mL (ref 0.30–0.70)
Heparin Unfractionated: 0.44 IU/mL (ref 0.30–0.70)

## 2014-09-10 MED ORDER — SODIUM CHLORIDE 0.9 % IV SOLN: INTRAVENOUS | Status: DC | PRN

## 2014-09-10 MED ORDER — SODIUM CHLORIDE 0.9 % IJ SOLN: 10.0000 mL | INTRAMUSCULAR | Status: DC | PRN

## 2014-09-10 MED ORDER — APIXABAN 5 MG PO TABS: 5.0000 mg | ORAL_TABLET | Freq: Two times a day (BID) | ORAL | Status: DC

## 2014-09-10 MED ORDER — DILTIAZEM HCL ER COATED BEADS 180 MG PO CP24
360.0000 mg | ORAL_CAPSULE | Freq: Every day | ORAL | Status: DC
  Administered 2014-09-10 – 2014-09-15 (×6): 360 mg via ORAL
  Filled 2014-09-10 (×8): qty 2

## 2014-09-10 MED ORDER — GUAIFENESIN ER 600 MG PO TB12
1200.0000 mg | ORAL_TABLET | Freq: Two times a day (BID) | ORAL | Status: DC
  Administered 2014-09-10 – 2014-09-14 (×10): 1200 mg via ORAL
  Filled 2014-09-10 (×11): qty 2

## 2014-09-10 MED ORDER — SODIUM CHLORIDE 0.9 % IJ SOLN
10.0000 mL | Freq: Two times a day (BID) | INTRAMUSCULAR | Status: DC
  Administered 2014-09-10 – 2014-09-13 (×6): 10 mL via INTRAVENOUS

## 2014-09-10 MED ORDER — APIXABAN 5 MG PO TABS
10.0000 mg | ORAL_TABLET | Freq: Two times a day (BID) | ORAL | Status: DC
  Administered 2014-09-10 – 2014-09-15 (×10): 10 mg via ORAL
  Filled 2014-09-10 (×10): qty 2

## 2014-09-10 NOTE — Progress Notes (Signed)
   09/10/14 1500  Clinical Encounter Type  Visited With Patient;Other (Comment) Johnathan Strong)  Visit Type Initial;Psychological support;Spiritual support  Spiritual Encounters  Spiritual Needs Prayer;Emotional  [facility] Referred to PT; [place] visited PT; PT local Pastor visiting offering support; [facility] and Johnathan Strong prayed with PT.

## 2014-09-10 NOTE — Discharge Instructions (Signed)
Information on my medicine - ELIQUIS (apixaban)  This medication education was reviewed with me or my healthcare representative as part of my discharge preparation.  The pharmacist that spoke with me during my hospital stay was:  Dareen Piano, [facility]  Why was Eliquis prescribed for you? Eliquis was prescribed to treat blood clots that may have been found in the veins of your legs (deep vein thrombosis) or in your lungs (pulmonary embolism) and to reduce the risk of them occurring again.  What do You need to know about Eliquis ? The starting dose is 10 mg (two 5 mg tablets) taken TWICE daily for the FIRST SEVEN (7) DAYS, then on 09/18/14 the dose is reduced to ONE 5 mg tablet taken TWICE daily.  Eliquis may be taken with or without food.   Try to take the dose about the same time in the morning and in the evening. If you have difficulty swallowing the tablet whole please discuss with your pharmacist how to take the medication safely.  Take Eliquis exactly as prescribed and DO NOT stop taking Eliquis without talking to the doctor who prescribed the medication.  Stopping may increase your risk of developing a new blood clot.  Refill your prescription before you run out.  After discharge, you should have regular check-up appointments with your healthcare provider that is prescribing your Eliquis.    What do you do if you miss a dose? If a dose of ELIQUIS is not taken at the scheduled time, take it as soon as possible on the same day and twice-daily administration should be resumed. The dose should not be doubled to make up for a missed dose.  Important Safety Information A possible side effect of Eliquis is bleeding. You should call your healthcare provider right away if you experience any of the following: ? Bleeding from an injury or your nose that does not stop. ? Unusual colored urine (red or dark brown) or unusual colored stools (red or black). ? Unusual bruising for unknown  reasons. ? A serious fall or if you hit your head (even if there is no bleeding).  Some medicines may interact with Eliquis and might increase your risk of bleeding or clotting while on Eliquis. To help avoid this, consult your healthcare provider or pharmacist prior to using any new prescription or non-prescription medications, including herbals, vitamins, non-steroidal anti-inflammatory drugs (NSAIDs) and supplements.  This website has more information on Eliquis (apixaban): http://www.eliquis.com/eliquis/home

## 2014-09-10 NOTE — Progress Notes (Signed)
UR Completed Vincente Asbridge Graves-Bigelow, RN,BSN [phone]  

## 2014-09-10 NOTE — Progress Notes (Addendum)
ANTICOAGULATION CONSULT NOTE - Follow Up Consult  Pharmacy Consult for Heparin  Indication: DVT  Allergies  Allergen Reactions  . Aspirin Anaphylaxis, Shortness Of Breath and Swelling  . Nsaids Anaphylaxis, Shortness Of Breath and Swelling    Raise BP  . Septra [Sulfamethoxazole-Trimethoprim] Rash  . Albuterol     Can not tolerate high/ frequent doses due to [place] with using HFA PRN  . Benicar [Olmesartan Medoxomil]     dizzy  . Salicylates     SOB  . Ampicillin Rash   Patient Measurements: Height: 5\' 8"  (172.7 cm) Weight: 154 lb 8.7 oz (70.1 kg) IBW/kg (Calculated) : 68.4   Vital Signs: Temp: 97.7 F (36.5 C) (07/07 0500) Temp Source: Oral (07/07 0500)  Labs:  Recent Labs  09/08/14 0448 09/09/14 0506 09/10/14 0036 09/10/14 0905  HGB 8.2* 8.2* 8.1*  --   HCT 25.3* 25.8* 25.1*  --   PLT 340 317 291  --   LABPROT  --   --  16.6*  --   INR  --   --  1.33  --   HEPARINUNFRC  --   --  0.31 0.44  CREATININE 1.19 1.30* 1.34*  --     Estimated Creatinine Clearance: 36.2 mL/min (by C-G formula based on Cr of 1.34).  Assessment: 81 yo male noted with new DVT (09/09/14) and also with history of afib on apixiban PTA (5mg  po bid). He is on heparin at 1100 units/hr and heparin level noted at goal (HL= 0.44).  -Apixiban on hold for possible pleural drain placement. Once resumed will need to change to 10mg  po bid for 7 day then begin 5mg  po bid.  Will also need to watch SCr trend (SCr= 1.34 today w/ trend up).   Goal of Therapy:  Heparin level 0.3-0.7 units/ml Monitor platelets by anticoagulation protocol: Yes   Plan:  -No heparin changes needed -When apixiban resumed will need change to DVT dosing -Daily CBC/HL  Hildred Laser, Pharm D 09/10/2014 10:21 AM  Addendum -Per CCM: No further plans for procedures and ok to begin [place] with Dr. Verlon Au and will begin apixiban with dosing for DVT  Plan -apixiban 10mg  po bid for 7days followed by 5mg  po bid  (start on 09/18/14) -discontinue heparin -Will provide patient education  Hildred Laser, Pharm D 09/10/2014 3:39 PM

## 2014-09-10 NOTE — Progress Notes (Signed)
ANTICOAGULATION CONSULT NOTE - Follow Up Consult  Pharmacy Consult for Heparin  Indication: DVT  Allergies  Allergen Reactions  . Aspirin Anaphylaxis, Shortness Of Breath and Swelling  . Nsaids Anaphylaxis, Shortness Of Breath and Swelling    Raise BP  . Septra [Sulfamethoxazole-Trimethoprim] Rash  . Albuterol     Can not tolerate high/ frequent doses due to [place] with using HFA PRN  . Benicar [Olmesartan Medoxomil]     dizzy  . Salicylates     SOB  . Ampicillin Rash   Patient Measurements: Height: 5\' 8"  (172.7 cm) Weight: 154 lb 12.8 oz (70.217 kg) IBW/kg (Calculated) : 68.4   Vital Signs: Temp: 98.3 F (36.8 C) (07/06 2036) Temp Source: Oral (07/06 2036) BP: 135/53 mmHg (07/06 1510) Pulse Rate: 72 (07/06 1510)  Labs:  Recent Labs  09/08/14 0448 09/09/14 0506 09/10/14 0036  HGB 8.2* 8.2* 8.1*  HCT 25.3* 25.8* 25.1*  PLT 340 317 291  LABPROT  --   --  16.6*  INR  --   --  1.33  HEPARINUNFRC  --   --  0.31  CREATININE 1.19 1.30* 1.34*    Estimated Creatinine Clearance: 36.2 mL/min (by C-G formula based on Cr of 1.34).  Assessment: New DVT, first HL is therapeutic, Hgb low but stable  Goal of Therapy:  Heparin level 0.3-0.7 units/ml Monitor platelets by anticoagulation protocol: Yes   Plan:  -Continue heparin at 1100 units/hr -0900 HL to confirm -Daily CBC/HL -Monitor for bleeding  Narda Bonds 09/10/2014,1:37 AM

## 2014-09-10 NOTE — Progress Notes (Signed)
Patient Name: Johnathan Strong Date of Encounter: 09/10/2014  Primary Cardiologist: Dr. Mare Ferrari   Principal Problem:   HCAP (healthcare-associated pneumonia) Active Problems:   CAD (coronary artery disease)   Atrial fibrillation   Chronic lymphatic leukemia   Pleural effusion on right   Sepsis   Pleural effusion   Pleural effusion, right   Blood poisoning   Chest tube in place   Pleural effusion associated with pulmonary infection   SOB (shortness of breath)   Essential hypertension   Respiratory failure    SUBJECTIVE  SOB improving with breathing treatment. No CP.   CURRENT MEDS . arformoterol  15 mcg Nebulization BID  . atorvastatin  10 mg Oral q1800  . budesonide (PULMICORT) nebulizer solution  0.5 mg Nebulization BID  . calcium carbonate  1 tablet Oral BID  . diltiazem  240 mg Oral Daily  . feeding supplement (ENSURE ENLIVE)  237 mL Oral TID BM  . furosemide  20 mg Oral Daily  . methylPREDNISolone (SOLU-MEDROL) injection  40 mg Intravenous Q6H  . metoprolol tartrate  50 mg Oral BID  . montelukast  10 mg Oral QHS  . polyethylene glycol  17 g Oral BID  . senna-docusate  1 tablet Oral BID  . senna-docusate  1 tablet Oral QHS    OBJECTIVE  Filed Vitals:   09/09/14 2038 09/10/14 0500 09/10/14 0731 09/10/14 0733  BP:      Pulse:      Temp:  97.7 F (36.5 C)    TempSrc:  Oral    Resp:      Height:      Weight: 154 lb 8.7 oz (70.1 kg)     SpO2: 97%  97% 97%    Intake/Output Summary (Last 24 hours) at 09/10/14 0818 Last data filed at 09/09/14 1800  Gross per 24 hour  Intake    360 ml  Output      0 ml  Net    360 ml   Filed Weights   09/08/14 0445 09/09/14 0500 09/09/14 2038  Weight: 158 lb 9.6 oz (71.94 kg) 154 lb 12.8 oz (70.217 kg) 154 lb 8.7 oz (70.1 kg)    PHYSICAL EXAM  General: Pleasant, NAD. Neuro: Alert and oriented X 3. Moves all extremities spontaneously. Psych: Normal affect. HEENT:  Normal  Neck: Supple without bruits or  JVD. Lungs:  Resp regular and unlabored. Bilateral wheezing and mild rhonchi this morning. Heart: irregular. no s3, s4, or murmurs. Abdomen: Soft, non-tender, non-distended, BS + x 4.  Extremities: No clubbing, cyanosis or edema. DP/PT/Radials 2+ and equal bilaterally.  Accessory Clinical Findings  CBC  Recent Labs  09/08/14 0448 09/09/14 0506 09/10/14 0036  WBC 17.5* 14.0* 11.4*  NEUTROABS 10.7*  --  6.2  HGB 8.2* 8.2* 8.1*  HCT 25.3* 25.8* 25.1*  MCV 90.7 89.9 90.3  PLT 340 317 161   Basic Metabolic Panel  Recent Labs  09/09/14 0506 09/10/14 0036  NA 139 134*  K 4.5 4.5  CL 98* 93*  CO2 33* 30  GLUCOSE 98 225*  BUN 26* 27*  CREATININE 1.30* 1.34*  CALCIUM 8.3* 8.3*  PHOS  --  3.1    TELE NSR with HR 60-70s    ECG  No new EKG  Echocardiogram 04/22/2014  LV EF: 60% -  65%  ------------------------------------------------------------------- Indications:   Dyspnea 786.09.  ------------------------------------------------------------------- History:  PMH:  Coronary artery disease. Chronic obstructive pulmonary disease. Risk factors: Former tobacco use. Hypertension.  ------------------------------------------------------------------- Study Conclusions  -  Left ventricle: The cavity size was normal. There was mild concentric hypertrophy. Systolic function was normal. The estimated ejection fraction was in the range of 60% to 65%. Wall motion was normal; there were no regional wall motion abnormalities. Doppler parameters are consistent with abnormal left ventricular relaxation (grade 1 diastolic dysfunction). Doppler parameters are consistent with high ventricular filling pressure. - Mitral valve: Moderately calcified annulus. There was mild regurgitation. - Left atrium: The atrium was mildly dilated.     Radiology/Studies  Dg Chest 2 View  09/07/2014   CLINICAL DATA:  Persistent but improving shortness of breath over  the course of her current hospitalization for right lower lobe pneumonia.  EXAM: CHEST  2 VIEW  COMPARISON:  09/06/2014 and earlier, including CT chest 08/30/2014.  FINDINGS: Cardiac silhouette normal in size. Thoracic aorta atherosclerotic, unchanged. Hilar and mediastinal contours otherwise unremarkable. Airspace consolidation in the right middle lobe and right lower lobe and moderate sized right pleural effusion, unchanged over the past 3 days, slightly improved since 5 days ago. Stable calcified granuloma in the medial left lower lobe. No new pulmonary parenchymal abnormalities. Pulmonary vascularity normal without evidence pulmonary edema. Degenerative changes and DISH involving the thoracic spine.  IMPRESSION: 1. Stable pneumonia and/or atelectasis in the right middle lobe and right lower lobe and stable moderate sized right pleural effusion. 2. No new abnormalities.   Electronically Signed   By: Evangeline Dakin M.D.   On: 09/07/2014 15:38   ASSESSMENT AND PLAN  1. Recurrent paroxysmal atrial fibrillation/flutter with RVR in the setting of severe lung dx  - CHADS2VASC score 6 (hypertension, age above 41, history of TIA, and vascular disease with prior coronary artery stents  - previously converted to NSR on 7/3, however went back to a-fib in the afternoon of 7/6, was in RVR yesterday, given additional 120mg  diltiazem with good rate control  - continue 50mg  BID of metoprolol and increase diltiazem to 360mg . Digoxin stopped  - has been going in and out of a-fib last night. Pending pulm eval for reinitiation of eliquis and plavix. Note, given new finding of DVT, he is placed on heparin, if eliquis is resumed, ?if he should restart on DVT dosing which is 10mg  BID for 7 days, then 5mg  BID afterward. Noting the serosanguinous drainage from last, ?if this will increase the recurrence rate. Some wheezing and rhonchi this morning, need continue on breathing treatment    2. Possible acute on  chronic diastolic HF vs PNA  - s/p diuresis, CT of chest showed RLL PNA. Afebrile, s/p short course of Levaquin and Vanc  - wean O2 as tolerated  3. HCAP: management per IM  4. Parapneumonic effusion s/p chest tube  5. CAD s/p DES to LAD in 2014, also noted to have ostial RCA occlusion with good L to R collateral - continue BB and statin, plavix on hold. Not on ASA given systemic anticoagulation need  6. COPD  7. CLL [facility] at [facility]  8. Anemia  9. Acute RLE DVT: found on U/S 7/6  Signed, Almyra Deforest PA-C Pager: [phone] As above, patient seen and examined. He continues to be dyspneic. No chest pain or palpitations. He developed recurrent atrial fibrillation last evening. Cardizem dose was increased and metoprolol continued. Plan rate control for now. He is also noted to have a right lower extremity DVT. Awaiting final decision from pulmonary concerning need for repeat thoracentesis. Once it is clear all procedures are complete we will resume apixaban and plavix. Continue IV heparin for  now. Continue low-dose Lasix. Continue bronchodilation. Kirk Ruths

## 2014-09-10 NOTE — Progress Notes (Addendum)
Physical Therapy Treatment Patient Details Name: Johnathan Strong MRN: [id number] DOB: [birth year] Today's Date: 09/10/2014    History of Present Illness 81 y.o. male with past history of COPD, paroxysmal A. fib on Eliquis, CAD, hypertension was recently discharged from [facility] 6/9 after treatment for community-acquired pneumonia. He presented to the ER where he was noted to have a white count of 24K, chest x-ray with worsening pneumonia and moderate right pleural effusion. RLE DVT, in/out A fib    PT Comments    Patient with dyspnea ambulating 50' on 2 liters with sats 85% after ambulation,. HR 110. Limited activity today.   Follow Up Recommendations  Home health PT;Supervision/Assistance - 24 hour (may benefit fron SNF  depending on clearance of medical/respiratory issues)     Equipment Recommendations  Rolling walker with 5" wheels    Recommendations for Other Services       Precautions / Restrictions Precautions Precautions: Fall Precaution Comments: monitor O2 Restrictions Weight Bearing Restrictions: No    Mobility  Bed Mobility   Bed Mobility: Supine to Sit     Supine to sit: Modified independent (Device/Increase time)        Transfers Overall transfer level: Needs assistance Equipment used: Rolling walker (2 wheeled) Transfers: Sit to/from Stand Sit to Stand: Min assist Stand pivot transfers: Min guard       General transfer comment: min guard to stand and sit down as pt tended to slightly plop back. Cues for hand placement.  Ambulation/Gait Ambulation/Gait assistance: Min guard;+2 safety/equipment Ambulation Distance (Feet): 50 Feet Assistive device: Rolling walker (2 wheeled) Gait Pattern/deviations: Step-through pattern Gait velocity: slower , cues to concentrate on breathing due to dyspnea, lomited ambulation today due to new DVt, DOE.   General Gait Details: steady with RW, SaO2 85 % 2 l, noted DOE, 3/4   Stairs             Wheelchair Mobility    Modified Rankin (Stroke Patients Only)       Balance                                    Cognition Arousal/Alertness: Awake/alert                          Exercises      General Comments        Pertinent Vitals/Pain Pain Score: 0-No pain    Home Living                      Prior Function            PT Goals (current goals can now be found in the care plan section) Progress towards PT goals: Progressing toward goals    Frequency  Min 3X/week    PT Plan Current plan remains appropriate    Co-evaluation             End of Session Equipment Utilized During Treatment: Oxygen Activity Tolerance: Treatment limited secondary to medical complications (Comment) (SOB) Patient left: in chair;with call bell/phone within reach;with family/visitor present     Time: 1012-1038 PT Time Calculation (min) (ACUTE ONLY): 26 min  Charges:  $Gait Training: 23-37 mins                    G Codes:      Claretha Cooper 09/10/2014,  11:44 AM Tresa Endo PT [phone]

## 2014-09-10 NOTE — Progress Notes (Signed)
[facility] PROGRESS NOTE  Johnathan Strong QIO:[id number] DOB: 29-Jul-1923 DOA: 08/25/2014 PCP: Redge Gainer, MD  Interim Summary  89-CML, recently discharged from the medicine service on 08/19/2014, admitted on 08/09/2014 during which he was treated for community-acquired pneumonia. Patient showed gradual improvement as he was weaned off of supplemental oxygen, tolerating by mouth intake, and was discharged on Ceftin.  His wife reporting that patient did well the week after discharge, got back to his normal routine, then had a decline  . Chest x-ray performed on admission revealed moderate right pleural effusion with associated right lung base consolidation/collapse.   He was started on broad-spectrum IV antibiotic therapy with vancomycin and ceftazidime, undergoing ultrasound-guided thoracentesis by interventional radiology on 08/26/2014.  Pulmonary critical care medicine was consulted regarding pleural effusion as it was felt he would benefit chest tube.  PCCM placed a 32 French chest tube on 08/27/2014 (rather than wayne cath) since there was marked fibrinous tissue and concern for loculation. Subsequent imagining showed incomplete drainage for which TPA was injected into pleural space. This seemed to help initially however subsequent injections did not yield significant fluid. PCCM recommended proceeding with IR consult for PIG-tail catheter, which was performed on 09/01/2014. Hospitalization was complicated by the moment of A. fib with RVR on 08/27/2014 likely precipitated by underlying pulmonary infectious process and acute hypoxemic respiratory failure. Controlling Afib has been a challenge. Dr Coralyn Pear consulted cardiology on 09/01/2014.  Patient was followed by cardiology and pulmonary during the hospitalization.  Patient had issues with A. fib with RVR and being hypotensive and a such difficult to titrate his rate controlling medications.and digoxin added Chest tubes were subsequently  removed. Patient subsequently converted back into normal sinus rhythm. Blood pressure improved. And heart rate is remained controlled. P atient with some bouts of shortness of breath felt to be secondary to possible volume overload.  Repeat chest x-ray with no acute abnormalities.  Patient's been given some IV Lasix and will be transitioned to oral Lasix on 09/09/2014.  Patient received 2 weeks of antibiotic treatment.  Pulmonary and critical care as well as cardiology following.   Assessment/Plan: #1 healthcare associated pneumonia with probable parapneumonic effusion Patient recently discharged on 08/19/2014 from the hospital after being treated for community-acquired pneumonia who presented with a probable parapneumonic effusion underwent ultrasound-guided thoracentesis by interventional radiology with a liter of yellow colored fluid removed with a cell count of 10,299. consistent with exudative characteristics.  No organisms seen on Gram stain. Blood cultures pending. Afebrile. WBC trending down.   chest tube placed on 08/27/2014 with 1200 mL output in all 625 and 626 minimal output.   On 09/01/2014 posterior 12 French chest tube was placed per IR. 09/02/2014 patient had right lateral chest tube removed per critical care. Patient is afebrile. WBC trending down. Treatment options were discussed -  drain catheter vs repeat thoracentesis.  VATS procedure was felt not to be well tolerated due to his multiple comorbidities, advanced age, A. fib with RVR. Pulmonary medicine gave TPA injections into pleural space due to amount of fibrous material and incomplete fluid drainage from the chest tube.  Patient did have a repeat chest CT that showed interval improvement in right-sided pleural effusion.  Patient was evaluated by Dr. Roxan Hockey of cardiothoracic surgery who did not recommend a VATS procedure.  Patient had pigtail catheter placement done on 09/01/2014 per interventional radiology.  Second  chest tube was removed.  Repeat chest x-ray done 09/08/14 with no interval change in the chest.  Persistent  right basilar collapse/consolidation and effusion. Clinical improvement.  Pulmonology has weighed in on 09/10/14-no further acute interventions for Chest tube plaements  RLE DVT -found on Doppler LE's on 7/6 -IV heparin started ---> BID Elliquis for 7 days per Pharmacy CT angio non suggestive PE -lifelong Coumadin as DVt + Afib  #2 acute hypoxemic respiratory failure Likely multifactorial secondary to problem #1. Improving slowly.  Patient with some shortness of breath on ambulation to the bathroom without his oxygen.  Repeat chest x-ray with no significant new changes.  Patient was given a dose of Lasix 20 mg IV 1 as well as nebs as needed.   Patient will be given another dose of IV Lasix this afternoon and then to start oral Lasix 20 mg daily tomorrow.  Patient is status post 2 weeks of antibiotics.  desat screen reveals need sOxygen on d/c  #3 A. fib with RVR CHADS2VASC score 6. Patient was noted to have heart rates in the 140s 4 nights ago.  Patient has flip-flopped between afib and NSR 2/2 patient's pulmonary process.  Patient has been seen in consultation by cardiology were adjusting patient's rate controlling medications.  Patient blood pressure with some improvement.  Metoprolol dose has been increased to 50 mg twice daily per cardiology 7/1 Diltiazem had been decreased to 240 mg daily-->2/2 to RVR ? 360 on 09/10/14 Digoxin 0.125 d/c 7/6 Per cardiology.  #4 coronary artery disease status post DES to the LAD in 2014 Stable. Continue beta blocker, statin. Plavix on hold. Per cardiology.  Start IV heparin as above  #5 acute on chronic diastolic heart failure Patient noted to have intermittent shortness of breath -Seems Euvolemic -Lasix currently at 20 daily  #6 sepsis Noted on admission as patient had a white count of 24.2, respiratory rate of 33 heart rate of 107  with source of infection being healthcare associated pneumonia. Blood cultures NEG Pleural fluid cultures NEG.  Patient status post 2 weeks of empiric antibiotics.    #7 hypertension See abvoe meds    #8 acute kidney injury Improvement. Stable. Follow.  #9 leukocytosis WBC24->17>14 --11.4 Patient is afebrile. Patient denies any diarrhea. Patient denies any dysuria.  Repeat chest x-ray done yesterday with stable pneumonia and/or atelectasis in the right middle lobe and right lower lobe and stable moderate size right pleural effusion.  No new abnormalities.  Patient status post approximately 2 weeks of antibiotics which have been discontinued.  Patient is currently afebrile.  No further antibiotics needed at this time.   #10 CLL Outpatient follow-up.  #11 prophylaxis SCDs for DVT prophylaxis.  Code Status: Full Family Communication: Updated patient, wife and daughter at bedside. Disposition Plan: Home with home health when medically stable.  Consultants:  Cardiology: Dr. Haroldine Laws 09/01/2014  CT surgery Dr. Roxan Hockey 08/28/2014  Critical care medicine Dr. Alva Garnet 08/26/2014  Procedures:  Ultrasound-guided thoracentesis 08/26/2014  CT guided drainage catheter placement into caudal aspect of right pleural space per Dr. Pascal Lux 09/01/2014  Right chest tube placement 08/27/2014  CT chest 08/30/2014, 08/25/2014  Right Chest tube removal per interventional radiology 09/04/2014  Significant tests/ events 6/23 A. fib RVR- transferred to stepdown unit 6/23 [place] of right chest - marked fibrinous tissue and concern for loculation t/o the right chest wall Rt chest tube 6/23 >>TPA 6/24: 1200 ml output, on 6/25 and 6/26 minimal output.  CT chest 6/26: Interval improvement in a moderate size right pleural fluid collection with associated compressive atelectasis over the right middle and lower lobes. Right-sided chest tube in  with tip over the posterior pleural space in the most  superior aspect of the pleural fluid collection. 6/28: posterior 12 fr chest tube placed in IR  6/29: right lateral 32 fr CT removed.  7/1 posterior chest tube removed  Antibiotics:  IV Fortaz 08/25/2014>>>>>>09/03/14  IV vancomycin 08/25/2014>>>> 09/01/2014  Oral levaquin 09/03/14>>>>> 09/06/2014  HPI/Subjective:   SOB Other wise feels strong NO othe rissues Tol PO fairly NO nv/cp  Objective: Filed Vitals:   09/10/14 1507  BP: 134/52  Pulse: 65  Temp: 97.7 F (36.5 C)  Resp:     Intake/Output Summary (Last 24 hours) at 09/10/14 1741 Last data filed at 09/10/14 0857  Gross per 24 hour  Intake    480 ml  Output      0 ml  Net    480 ml   Filed Weights   09/08/14 0445 09/09/14 0500 09/09/14 2038  Weight: 71.94 kg (158 lb 9.6 oz) 70.217 kg (154 lb 12.8 oz) 70.1 kg (154 lb 8.7 oz)    Exam:   General:  NAD  Cardiovascular: RRR  Respiratory: Decreased breath sounds in the right base, otherwise clear.  Abdomen: Soft, nontender, nondistended, positive bowel sounds.  Musculoskeletal: No clubbing cyanosis or edema.  Data Reviewed: Basic Metabolic Panel:  Recent Labs Lab 09/06/14 0416 09/07/14 0446 09/08/14 0448 09/09/14 0506 09/10/14 0036  NA 135 135 137 139 134*  K 4.5 4.7 4.5 4.5 4.5  CL 100* 99* 101 98* 93*  CO2 28 29 25  33* 30  GLUCOSE 106* 136* 92 98 225*  BUN 26* 33* 34* 26* 27*  CREATININE 1.28* 1.43* 1.19 1.30* 1.34*  CALCIUM 8.0* 8.2* 8.0* 8.3* 8.3*  PHOS  --   --   --   --  3.1   Liver Function Tests:  Recent Labs Lab 09/10/14 0036  ALBUMIN 2.3*   No results for input(s): LIPASE, AMYLASE in the last 168 hours. No results for input(s): AMMONIA in the last 168 hours. CBC:  Recent Labs Lab 09/06/14 0416 09/07/14 0446 09/08/14 0448 09/09/14 0506 09/10/14 0036  WBC 18.5* 24.2* 17.5* 14.0* 11.4*  NEUTROABS  --   --  10.7*  --  6.2  HGB 7.9* 8.0* 8.2* 8.2* 8.1*  HCT 24.4* 24.9* 25.3* 25.8* 25.1*  MCV 89.4 88.6 90.7 89.9 90.3   PLT 336 356 340 317 291   Cardiac Enzymes: No results for input(s): CKTOTAL, CKMB, CKMBINDEX, TROPONINI in the last 168 hours. BNP (last 3 results)  Recent Labs  04/21/14 1541 08/25/14 0700  BNP 484.0* 224.5*    ProBNP (last 3 results) No results for input(s): PROBNP in the last 8760 hours.  CBG: No results for input(s): GLUCAP in the last 168 hours.  Recent Results (from the past 240 hour(s))  Culture, body fluid-bottle     Status: None   Collection Time: 09/01/14  3:45 PM  Result Value Ref Range Status   Specimen Description PLEURAL FLUID  Final   Special Requests NONE  Final   Culture NO GROWTH 5 DAYS  Final   Report Status 09/06/2014 FINAL  Final  Gram stain     Status: None   Collection Time: 09/01/14  3:45 PM  Result Value Ref Range Status   Specimen Description PLEURAL FLUID  Final   Special Requests NONE  Final   Gram Stain   Final    ABUNDANT WBC PRESENT,BOTH PMN AND MONONUCLEAR NO ORGANISMS SEEN    Report Status 09/02/2014 FINAL  Final  Studies: Dg Chest 2 View  09/10/2014   CLINICAL DATA:  Shortness of breath.  Cough.  Pneumonia.  COPD.  EXAM: CHEST  2 VIEW  COMPARISON:  09/08/2014  FINDINGS: Small right pleural effusion and right lower lobe infiltrate show no significant interval change. Left lung remains clear. No evidence of left-sided pleural effusion. Heart size remains within normal limits. Pulmonary hyperinflation again noted, consistent with COPD.  IMPRESSION: No significant change in small right pleural effusion and right lower lobe infiltrate.   Electronically Signed   By: Earle Gell M.D.   On: 09/10/2014 09:07    Scheduled Meds: . apixaban  10 mg Oral BID   Followed by  . [START ON 09/18/2014] apixaban  5 mg Oral BID  . arformoterol  15 mcg Nebulization BID  . atorvastatin  10 mg Oral q1800  . budesonide (PULMICORT) nebulizer solution  0.5 mg Nebulization BID  . calcium carbonate  1 tablet Oral BID  . diltiazem  360 mg Oral Daily  .  feeding supplement (ENSURE ENLIVE)  237 mL Oral TID BM  . furosemide  20 mg Oral Daily  . guaiFENesin  1,200 mg Oral BID  . methylPREDNISolone (SOLU-MEDROL) injection  40 mg Intravenous Q6H  . metoprolol tartrate  50 mg Oral BID  . montelukast  10 mg Oral QHS  . polyethylene glycol  17 g Oral BID  . senna-docusate  1 tablet Oral BID  . senna-docusate  1 tablet Oral QHS   Continuous Infusions: . sodium chloride 10 mL/hr at 09/01/14 1204  . heparin 1,100 Units/hr (09/09/14 1723)    Principal Problem:   HCAP (healthcare-associated pneumonia) Active Problems:   CAD (coronary artery disease)   Atrial fibrillation   Chronic lymphatic leukemia   Pleural effusion on right   Sepsis   Pleural effusion   Pleural effusion, right   Blood poisoning   Chest tube in place   Pleural effusion associated with pulmonary infection   SOB (shortness of breath)   Essential hypertension   Respiratory failure   Dyspnea    Time spent: 35 minutes Verneita Griffes, MD [facility] (P) [phone]

## 2014-09-10 NOTE — Progress Notes (Signed)
PCCM NOTE  Subjective: He is still short of breath, NSC from yesterday(7-6)  Objective: BP 142/55 mmHg  Pulse 109  Temp(Src) 97.7 F (36.5 C) (Oral)  Resp 18  Ht 5\' 8"  (1.727 m)  Wt 154 lb 8.7 oz (70.1 kg)  BMI 23.50 kg/m2  SpO2 97%  General: mild increase WOB. nsc  HEENT: no sinus tenderness Cardiac: regular currently sinus Chest: faint b/l wheeze, decreased bs Rt base Abd: soft, non tender Ext: no edema Neuro: normal strength   CMP Latest Ref Rng 09/10/2014 09/09/2014 09/08/2014  Glucose 65 - 99 mg/dL 225(H) 98 92  BUN 6 - 20 mg/dL 27(H) 26(H) 34(H)  Creatinine 0.61 - 1.24 mg/dL 1.34(H) 1.30(H) 1.19  Sodium 135 - 145 mmol/L 134(L) 139 137  Potassium 3.5 - 5.1 mmol/L 4.5 4.5 4.5  Chloride 101 - 111 mmol/L 93(L) 98(L) 101  CO2 22 - 32 mmol/L 30 33(H) 25  Calcium 8.9 - 10.3 mg/dL 8.3(L) 8.3(L) 8.0(L)  Total Protein 6.5 - 8.1 g/dL - - -  Albumin 3.5 - 4.7 g/dL - - -  Total Bilirubin 0.3 - 1.2 mg/dL - - -  Alkaline Phos 38 - 126 U/L - - -  AST 15 - 41 U/L - - -  ALT 17 - 63 U/L - - -    CBC Latest Ref Rng 09/10/2014 09/09/2014 09/08/2014  WBC 4.0 - 10.5 K/uL 11.4(H) 14.0(H) 17.5(H)  Hemoglobin 13.0 - 17.0 g/dL 8.1(L) 8.2(L) 8.2(L)  Hematocrit 39.0 - 52.0 % 25.1(L) 25.8(L) 25.3(L)  Platelets 150 - 400 K/uL 291 317 340    Dg Chest 2 View  09/10/2014   CLINICAL DATA:  Shortness of breath.  Cough.  Pneumonia.  COPD.  EXAM: CHEST  2 VIEW  COMPARISON:  09/08/2014  FINDINGS: Small right pleural effusion and right lower lobe infiltrate show no significant interval change. Left lung remains clear. No evidence of left-sided pleural effusion. Heart size remains within normal limits. Pulmonary hyperinflation again noted, consistent with COPD.  IMPRESSION: No significant change in small right pleural effusion and right lower lobe infiltrate.   Electronically Signed   By: Earle Gell M.D.   On: 09/10/2014 09:07   Ct Angio Chest Pe W/cm &/or Wo Cm  09/08/2014   CLINICAL DATA:  Dyspnea this  morning, acute respiratory failure  EXAM: CT ANGIOGRAPHY CHEST WITH CONTRAST  TECHNIQUE: Multidetector CT imaging of the chest was performed using the standard protocol during bolus administration of intravenous contrast. Multiplanar CT image reconstructions and MIPs were obtained to evaluate the vascular anatomy.  CONTRAST:  163mL OMNIPAQUE IOHEXOL 350 MG/ML SOLN  COMPARISON:  08/30/2014  FINDINGS: Left arm contrast injection. SVC is patent. Right ventricle is nondilated. Mild dilatation of central pulmonary arteries. Satisfactory opacification of pulmonary arteries noted, and there is no evidence of pulmonary emboli. Patent bilateral pulmonary veins. Extensive coronary calcifications. Adequate contrast opacification of the thoracic aorta with no evidence of dissection, aneurysm, or stenosis. There is classic 3-vessel brachiocephalic arch anatomy without proximal stenosis. Moderate plaque in the aortic arch and descending aorta.  Small left and moderate right pleural effusions. There is some pleural enhancement on the right with the loculation posteriorly at the right lung base and multiple gas bubbles within the pleural space. There is a small locule of gas in the pleural space anteriorly, and a small gas bubble in the subcutaneous tissues in the right lateral chest wall probably from previous chest tube placement. Calcified granuloma in the left lower lobe. No pericardial effusion. Borderline  enlarged left hilar lymph nodes. 12 mm right suprahilar lymph node. Subcentimeter subcarinal and AP window lymph nodes.  Consolidation/atelectasis posteriorly at the right lung base. Minimal dependent atelectasis posteriorly in the left lower lobe. Bridging osteophytes across multiple contiguous levels in the thoracic spine. Spondylitic changes in the visualized lower cervical spine. Sternum is intact. Visualized portions of upper abdomen unremarkable.  Review of the MIP images confirms the above findings.  IMPRESSION: 1.  Negative for acute PE or thoracic aortic dissection. 2. Moderate residual loculated right pleural effusion with some pleural enhancement suggesting possible empyema. 3. Adjacent consolidation in the right lower lobe suggesting pneumonia. 4. Atherosclerosis, including aortic and coronary artery disease. Please note that although the presence of coronary artery calcium documents the presence of coronary artery disease, the severity of this disease and any potential stenosis cannot be assessed on this non-gated CT examination. Assessment for potential risk factor modification, dietary therapy or pharmacologic therapy may be warranted, if clinically indicated.   Electronically Signed   By: Lucrezia Europe M.D.   On: 09/08/2014 14:07     Events: 6/21 Admit 6/22 Rt thoracentesis 6/23 Pig tail catheter placed by DF 6/24 TCTS consulted >> poor candidate for surgery; tPA into chest tube with 1300 ml fluid out 6/25 tPA into chest tube >> not much fluid out 6/26 tPA into chest tube >> not much fluid out 6/28 IR placed catheter in Rt pleural space 7/01 Chest tube removed  Studies: 6/22 Rt Pleural fluid >> 1 liter, LDH 341, protein 3.7, WBC 10,299 (93% N) 6/30 Rt Pleural fluid >> LDH 3452, WBC 1930 (97% N) 7/05 CT chest >> mod Rt pleural effusion with loculation, consolidation Rt base  Discussion: 81 yo male with fever, chills, night sweats from HCAP and parapneumonic effusion.  He also has COPD, A fib, and diastolic heart failure.  He had worsening dyspnea on 7/05.  CT chest shows persistence of Rt pleural effusion.  He reports his symptoms improved with BD therapy, and he has b/l wheezing on exam 7/06.  7/7 in and out of afib. [place] ib effusion by c x r. Concerned that repeated invasive interventions will lead to complications. Assessment/Plan:  COPD exacerbation. Plan: - add pulmicort, brovana, and prn xopenex - added solumedrol 40 mg q6 h 7/6  HCAP with Rt parapneumonic pleural effusion. Not  candidate for surgery.  Not sure his current symptoms are related to progression of effusion.  Cx's negative. 7/5 CT scan questions possible infection. WBC trending down and afebrile but he has CLL.  Plan: - Off Abx since 7/03 - f/u 2 view CXR 7/07 >> NSC ? Replacement of pig tailed chest tube.  Acute hypoxic respiratory failure. Plan: - continue oxygen to keep SpO2 90 to 95%  A fib, acute on chronic diastolic CHF, CAD s/p stent. Plan: - per primary team and cardiology, on heparin drip for new left dvt but neg ct chest for pe.  Hx of CLL. Plan: - f/u CBC   Updated pt's daughter at bedside.

## 2014-09-11 ENCOUNTER — Inpatient Hospital Stay (HOSPITAL_COMMUNITY): Payer: Medicare Other

## 2014-09-11 LAB — CBC
HCT: 25 % — ABNORMAL LOW (ref 39.0–52.0)
Hemoglobin: 8 g/dL — ABNORMAL LOW (ref 13.0–17.0)
MCH: 28.8 pg (ref 26.0–34.0)
MCHC: 32 g/dL (ref 30.0–36.0)
MCV: 89.9 fL (ref 78.0–100.0)
PLATELETS: 288 10*3/uL (ref 150–400)
RBC: 2.78 MIL/uL — AB (ref 4.22–5.81)
RDW: 22.5 % — AB (ref 11.5–15.5)
WBC: 16.3 10*3/uL — ABNORMAL HIGH (ref 4.0–10.5)

## 2014-09-11 MED ORDER — PREDNISONE 20 MG PO TABS
60.0000 mg | ORAL_TABLET | Freq: Every day | ORAL | Status: DC
  Administered 2014-09-12 – 2014-09-14 (×3): 60 mg via ORAL
  Filled 2014-09-11 (×3): qty 3

## 2014-09-11 MED ORDER — NYSTATIN 100000 UNIT/ML MT SUSP
5.0000 mL | Freq: Four times a day (QID) | OROMUCOSAL | Status: DC
  Administered 2014-09-11 – 2014-09-15 (×13): 500000 [IU] via OROMUCOSAL
  Filled 2014-09-11 (×12): qty 5

## 2014-09-11 NOTE — Progress Notes (Signed)
[facility] PROGRESS NOTE  CAMBRIDGE DELEO FTD:[id number] DOB: 04/27/1923 DOA: 08/25/2014 PCP: Redge Gainer, MD  Interim Summary  89-CML, recently discharged from the medicine service on 08/19/2014, admitted on 08/09/2014 during which he was treated for community-acquired pneumonia. Patient showed gradual improvement as he was weaned off of supplemental oxygen, tolerating by mouth intake, and was discharged on Ceftin.  His wife reporting that patient did well the week after discharge, got back to his normal routine, then had a decline  . Chest x-ray performed on admission revealed moderate right pleural effusion with associated right lung base consolidation/collapse.   He was started on broad-spectrum IV antibiotic therapy with vancomycin and ceftazidime, undergoing ultrasound-guided thoracentesis by interventional radiology on 08/26/2014.  Pulmonary critical care medicine was consulted regarding pleural effusion as it was felt he would benefit chest tube.  PCCM placed a 32 French chest tube on 08/27/2014 (rather than wayne cath) since there was marked fibrinous tissue and concern for loculation. Subsequent imagining showed incomplete drainage for which TPA was injected into pleural space. This seemed to help initially however subsequent injections did not yield significant fluid. PCCM recommended proceeding with IR consult for PIG-tail catheter, which was performed on 09/01/2014. Hospitalization was complicated by the moment of A. fib with RVR on 08/27/2014 likely precipitated by underlying pulmonary infectious process and acute hypoxemic respiratory failure. Controlling Afib has been a challenge. Dr Coralyn Pear consulted cardiology on 09/01/2014.  Patient was followed by cardiology and pulmonary during the hospitalization.  Patient had issues with A. fib with RVR and being hypotensive and a such difficult to titrate his rate controlling medications.and digoxin added Chest tubes were subsequently  removed. Patient subsequently converted back into normal sinus rhythm. Blood pressure improved. And heart rate is remained controlled. P atient with some bouts of shortness of breath felt to be secondary to possible volume overload.  Repeat chest x-ray with no acute abnormalities.  Patient's been given some IV Lasix and will be transitioned to oral Lasix on 09/09/2014.  Patient received 2 weeks of antibiotic treatment.  Pulmonary and critical care as well as cardiology following.   Assessment/Plan: #1 healthcare associated pneumonia with probable parapneumonic effusion Patient recently discharged on 08/19/2014 from the hospital after being treated for community-acquired pneumonia who presented with a probable parapneumonic effusion underwent ultrasound-guided thoracentesis by interventional radiology with a liter of yellow colored fluid removed with a cell count of 10,299. consistent with exudative characteristics.  No organisms seen on Gram stain. Blood cultures pending. Afebrile. WBC trending down.   chest tube placed on 08/27/2014 with 1200 mL output in all 625 and 626 minimal output.   On 09/01/2014 posterior 12 French chest tube was placed per IR. 09/02/2014 patient had right lateral chest tube removed per critical care. Patient is afebrile. WBC trending down. Treatment options were discussed -  drain catheter vs repeat thoracentesis.  VATS procedure was felt not to be well tolerated due to his multiple comorbidities, advanced age, A. fib with RVR. Pulmonary medicine gave TPA injections into pleural space due to amount of fibrous material and incomplete fluid drainage from the chest tube.  Patient did have a repeat chest CT that showed interval improvement in right-sided pleural effusion.  Patient was evaluated by Dr. Roxan Hockey of cardiothoracic surgery who did not recommend a VATS procedure.  Patient had pigtail catheter placement done on 09/01/2014 per interventional radiology.  Second  chest tube was removed.  Repeat chest x-ray done 09/08/14 with no interval change in the chest.  Persistent  right basilar collapse/consolidation and effusion. Clinical improvement.  Pulmonology has weighed in on 09/10/14-no further acute interventions for Chest tube plaements -continue Brovana/pulmicort and Xopenex -transition off IV steroids 24 hours and reassess on PO prednisone long taper -will need OP pulm f/u in ~ 1 week  RLE DVT -found on Doppler LE's on 7/6 -IV heparin started ---> BID Elliquis-Elliquis 10 mg bid till 7/15, then 5mg  bid subsequently -CT angio non suggestive PE  #2 acute hypoxemic respiratory failure Likely multifactorial secondary to problem #1. Improving slowly.  Patient with some shortness of breath on ambulation to the bathroom without his oxygen.  Repeat chest x-ray with no significant new changes. Was on IV and transitioned to oral Lasix 20 mg daily tomorrow.  Patient is status post 2 weeks of antibiotics.  desat screen reveals needs Oxygen on d/c  #3 A. fib with RVR on Elliquis CHADS2VASC score 6. Patient was noted to have heart rates in the 140s earlier in admit Patient has flip-flopped between afib and NSR 2/2 patient's pulmonary process.  Patient has been seen in consultation by cardiology were adjusting patient's rate controlling medications.  Patient blood pressure with some improvement.  Metoprolol dose has been increased to 50 mg twice daily per cardiology 7/1 Diltiazem had been decreased to 240 mg daily-->2/2 to RVR ? 360 on 09/10/14 Digoxin 0.125 d/c 7/6 Per cardiology.  #4 coronary artery disease status post DES to the LAD in 2014 Stable. Continue beta blocker, statin. Plavix on hold. Per cardiology.  Plavix to re-start once Elliquis dosing is 5 mg bid [on 7/15]  #5 acute on chronic diastolic heart failure Patient noted to have intermittent shortness of breath -Seems Euvolemic -Lasix currently at 20 daily  #6 sepsis Noted on admission as  patient had a white count of 24.2, respiratory rate of 33 heart rate of 107 with source of infection being healthcare associated pneumonia. Blood cultures NEG Pleural fluid cultures NEG.  Patient status post 2 weeks of empiric antibiotics.    #7 hypertension See abvoe meds    #8 acute kidney injury Improvement. Stable. Follow.  #9 leukocytosis WBC24->17>14 --11.4--16 Patient is afebrile. Patient denies any diarrhea. Patient denies any dysuria.  Repeat chest x-ray 7/8= small complex effusion unchanged Patient status post approximately 2 weeks of antibiotics which have been discontinued.  Patient is currently afebrile.  No further antibiotics needed at this time.   #10 CLL Outpatient follow-up. Likely contributes to leukocytosis  #11 prophylaxis SCDs for DVT prophylaxis.  Code Status: Full Family Communication: Updated patient, wife and daughter at bedside. Disposition Plan: Home with home health when medically stable.  Consultants:  Cardiology: Dr. Haroldine Laws 09/01/2014  CT surgery Dr. Roxan Hockey 08/28/2014  Critical care medicine Dr. Alva Garnet 08/26/2014  Procedures:  Ultrasound-guided thoracentesis 08/26/2014  CT guided drainage catheter placement into caudal aspect of right pleural space per Dr. Pascal Lux 09/01/2014  Right chest tube placement 08/27/2014  CT chest 08/30/2014, 08/25/2014  Right Chest tube removal per interventional radiology 09/04/2014  Significant tests/ events 6/23 A. fib RVR- transferred to stepdown unit 6/23 [place] of right chest - marked fibrinous tissue and concern for loculation t/o the right chest wall Rt chest tube 6/23 >>TPA 6/24: 1200 ml output, on 6/25 and 6/26 minimal output.  CT chest 6/26: Interval improvement in a moderate size right pleural fluid collection with associated compressive atelectasis over the right middle and lower lobes. Right-sided chest tube in with tip over the posterior pleural space in the most superior aspect of the  pleural  fluid collection. 6/28: posterior 12 fr chest tube placed in IR  6/29: right lateral 32 fr CT removed.  7/1 posterior chest tube removed  Antibiotics:  IV Fortaz 08/25/2014>>>>>>09/03/14  IV vancomycin 08/25/2014>>>> 09/01/2014  Oral levaquin 09/03/14>>>>> 09/06/2014  HPI/Subjective:  Looks much better  feels stronger walked yesterday ~ 10 steps To diet   Objective: Filed Vitals:   09/11/14 1300  BP: 147/61  Pulse: 78  Temp: 98 F (36.7 C)  Resp: 22    Intake/Output Summary (Last 24 hours) at 09/11/14 1351 Last data filed at 09/11/14 1300  Gross per 24 hour  Intake    603 ml  Output    650 ml  Net    -47 ml   Filed Weights   09/09/14 0500 09/09/14 2038 09/11/14 0400  Weight: 70.217 kg (154 lb 12.8 oz) 71.278 kg (157 lb 2.2 oz) 71.578 kg (157 lb 12.8 oz)    Exam:   General:  NAD  Cardiovascular: RRR  Respiratory: Decreased breath sounds in the right base, otherwise clear.  Abdomen: Soft, nontender, nondistended, positive bowel sounds.  Musculoskeletal: No clubbing cyanosis or edema.  Data Reviewed: Basic Metabolic Panel:  Recent Labs Lab 09/06/14 0416 09/07/14 0446 09/08/14 0448 09/09/14 0506 09/10/14 0036  NA 135 135 137 139 134*  K 4.5 4.7 4.5 4.5 4.5  CL 100* 99* 101 98* 93*  CO2 28 29 25  33* 30  GLUCOSE 106* 136* 92 98 225*  BUN 26* 33* 34* 26* 27*  CREATININE 1.28* 1.43* 1.19 1.30* 1.34*  CALCIUM 8.0* 8.2* 8.0* 8.3* 8.3*  PHOS  --   --   --   --  3.1   Liver Function Tests:  Recent Labs Lab 09/10/14 0036  ALBUMIN 2.3*   No results for input(s): LIPASE, AMYLASE in the last 168 hours. No results for input(s): AMMONIA in the last 168 hours. CBC:  Recent Labs Lab 09/07/14 0446 09/08/14 0448 09/09/14 0506 09/10/14 0036 09/11/14 0351  WBC 24.2* 17.5* 14.0* 11.4* 16.3*  NEUTROABS  --  10.7*  --  6.2  --   HGB 8.0* 8.2* 8.2* 8.1* 8.0*  HCT 24.9* 25.3* 25.8* 25.1* 25.0*  MCV 88.6 90.7 89.9 90.3 89.9  PLT 356 340 317  291 288   Cardiac Enzymes: No results for input(s): CKTOTAL, CKMB, CKMBINDEX, TROPONINI in the last 168 hours. BNP (last 3 results)  Recent Labs  04/21/14 1541 08/25/14 0700  BNP 484.0* 224.5*    ProBNP (last 3 results) No results for input(s): PROBNP in the last 8760 hours.  CBG: No results for input(s): GLUCAP in the last 168 hours.  Recent Results (from the past 240 hour(s))  Culture, body fluid-bottle     Status: None   Collection Time: 09/01/14  3:45 PM  Result Value Ref Range Status   Specimen Description PLEURAL FLUID  Final   Special Requests NONE  Final   Culture NO GROWTH 5 DAYS  Final   Report Status 09/06/2014 FINAL  Final  Gram stain     Status: None   Collection Time: 09/01/14  3:45 PM  Result Value Ref Range Status   Specimen Description PLEURAL FLUID  Final   Special Requests NONE  Final   Gram Stain   Final    ABUNDANT WBC PRESENT,BOTH PMN AND MONONUCLEAR NO ORGANISMS SEEN    Report Status 09/02/2014 FINAL  Final     Studies: Dg Chest 2 View  09/11/2014   CLINICAL DATA:  81 year old male with right pleural effusion  and shortness of breath.  EXAM: CHEST  2 VIEW  COMPARISON:  Chest x-ray 09/10/2014.  FINDINGS: Small chronic right-sided pleural effusion is unchanged compared to recent priors. This is again associated with adjacent pleuroparenchymal scarring throughout the right mid to lower lung, similar to the recent prior examination. The possibility of consolidative airspace disease in the right middle and/or right lower lobe is not excluded, but is not strongly favored. Left lung is clear. Trace left pleural effusion. No evidence of pulmonary edema. Heart size is normal. Upper mediastinal contours are within normal limits allowing for patient positioning. Atherosclerosis in the thoracic aorta.  IMPRESSION: 1. Small complex chronic right pleural effusion with associated pleuroparenchymal scarring and/or atelectasis in the right mid to lower lung, unchanged. 2.  Trace left pleural effusion. 3. Atherosclerosis.   Electronically Signed   By: Vinnie Langton M.D.   On: 09/11/2014 08:03   Dg Chest 2 View  09/10/2014   CLINICAL DATA:  Shortness of breath.  Cough.  Pneumonia.  COPD.  EXAM: CHEST  2 VIEW  COMPARISON:  09/08/2014  FINDINGS: Small right pleural effusion and right lower lobe infiltrate show no significant interval change. Left lung remains clear. No evidence of left-sided pleural effusion. Heart size remains within normal limits. Pulmonary hyperinflation again noted, consistent with COPD.  IMPRESSION: No significant change in small right pleural effusion and right lower lobe infiltrate.   Electronically Signed   By: Earle Gell M.D.   On: 09/10/2014 09:07    Scheduled Meds: . apixaban  10 mg Oral BID   Followed by  . [START ON 09/18/2014] apixaban  5 mg Oral BID  . arformoterol  15 mcg Nebulization BID  . atorvastatin  10 mg Oral q1800  . budesonide (PULMICORT) nebulizer solution  0.5 mg Nebulization BID  . calcium carbonate  1 tablet Oral BID  . diltiazem  360 mg Oral Daily  . feeding supplement (ENSURE ENLIVE)  237 mL Oral TID BM  . furosemide  20 mg Oral Daily  . guaiFENesin  1,200 mg Oral BID  . methylPREDNISolone (SOLU-MEDROL) injection  40 mg Intravenous Q6H  . metoprolol tartrate  50 mg Oral BID  . montelukast  10 mg Oral QHS  . polyethylene glycol  17 g Oral BID  . senna-docusate  1 tablet Oral BID  . senna-docusate  1 tablet Oral QHS  . sodium chloride  10 mL Intravenous Q12H   Continuous Infusions:    Principal Problem:   HCAP (healthcare-associated pneumonia) Active Problems:   CAD (coronary artery disease)   Atrial fibrillation   Chronic lymphatic leukemia   Pleural effusion on right   Sepsis   Pleural effusion   Pleural effusion, right   Blood poisoning   Chest tube in place   Pleural effusion associated with pulmonary infection   SOB (shortness of breath)   Essential hypertension   Respiratory failure    Dyspnea    Time spent: 35 minutes Verneita Griffes, MD [facility] (P) [phone]

## 2014-09-11 NOTE — Care Management (Signed)
Important Message  Patient Details  Name: Johnathan Strong MRN: [id number] Date of Birth: 1923-10-07   Medicare Important Message Given:  Yes-third notification given    Delorse Lek 09/11/2014, 10:35 AM

## 2014-09-11 NOTE — Care Management Note (Signed)
Case Management Note  Patient Details  Name: Johnathan Strong MRN: [id number] Date of Birth: 08-29-1923  Subjective/Objective:   Pt admitted for HCAP with Rt parapneumonic pleural effusion. Plan for d/c possibly Sunday per MD.                  Action/Plan: CM did speak with pt and wife in regards to [facility] services and both are agreeable to [facility] for services listed below. CM did make referral for [facility] services with [facility]. SOC to begin within 24-48 hours post d/c. CM did speak with [facility] DME Liaison Jermaine in ref to DME [employer]. Order in place for dme. RN is aware to ambulate pt for home 02 prior to d/c. Weekend Liaison for [facility] is aware that pt may need 02 if qualifies as well. No further needs from CM at this time.    Expected Discharge Date:                  Expected Discharge Plan:  [place]  In-House Referral:     Discharge planning Services  CM Consult  Post Acute Care Choice:  Home Health Choice offered to:  Patient, Spouse, Adult Children  DME Arranged:  [place] rolling, Nebulizer/meds, Oxygen DME Agency:  [place] Arranged:  PT, RN, Disease Management, OT, Nurse's Aide [place] Agency:  [place]  Status of Service:  Completed, signed off  Medicare Important Message Given:  Yes-third notification given Date Medicare IM Given:  08/28/14 Medicare IM give by:  Johnathan Krauss, RN,BSN  Date Additional Medicare IM Given:    Additional Medicare Important Message give by:     If discussed at [place] of Stay Meetings, dates discussed:    Additional Comments:  Bethena Roys, RN 09/11/2014, 2:27 PM

## 2014-09-11 NOTE — Progress Notes (Signed)
PCCM NOTE  Subjective: He is still short of breath, NSC from yesterday(7-7)  Objective: BP 153/78 mmHg  Pulse 87  Temp(Src) 98.2 F (36.8 C) (Oral)  Resp 20  Ht 5\' 8"  (1.727 m)  Wt 157 lb 12.8 oz (71.578 kg)  BMI 24.00 kg/m2  SpO2 96%  General: mild increase WOB. nsc  HEENT: no sinus tenderness Cardiac: regular currently sinus Chest: faint b/l wheeze, decreased bs Rt base Abd: soft, non tender Ext: no edema Neuro: normal strength   CMP Latest Ref Rng 09/10/2014 09/09/2014 09/08/2014  Glucose 65 - 99 mg/dL 225(H) 98 92  BUN 6 - 20 mg/dL 27(H) 26(H) 34(H)  Creatinine 0.61 - 1.24 mg/dL 1.34(H) 1.30(H) 1.19  Sodium 135 - 145 mmol/L 134(L) 139 137  Potassium 3.5 - 5.1 mmol/L 4.5 4.5 4.5  Chloride 101 - 111 mmol/L 93(L) 98(L) 101  CO2 22 - 32 mmol/L 30 33(H) 25  Calcium 8.9 - 10.3 mg/dL 8.3(L) 8.3(L) 8.0(L)  Total Protein 6.5 - 8.1 g/dL - - -  Albumin 3.5 - 4.7 g/dL - - -  Total Bilirubin 0.3 - 1.2 mg/dL - - -  Alkaline Phos 38 - 126 U/L - - -  AST 15 - 41 U/L - - -  ALT 17 - 63 U/L - - -    CBC Latest Ref Rng 09/11/2014 09/10/2014 09/09/2014  WBC 4.0 - 10.5 K/uL 16.3(H) 11.4(H) 14.0(H)  Hemoglobin 13.0 - 17.0 g/dL 8.0(L) 8.1(L) 8.2(L)  Hematocrit 39.0 - 52.0 % 25.0(L) 25.1(L) 25.8(L)  Platelets 150 - 400 K/uL 288 291 317    Dg Chest 2 View  09/11/2014   CLINICAL DATA:  81 year old male with right pleural effusion and shortness of breath.  EXAM: CHEST  2 VIEW  COMPARISON:  Chest x-ray 09/10/2014.  FINDINGS: Small chronic right-sided pleural effusion is unchanged compared to recent priors. This is again associated with adjacent pleuroparenchymal scarring throughout the right mid to lower lung, similar to the recent prior examination. The possibility of consolidative airspace disease in the right middle and/or right lower lobe is not excluded, but is not strongly favored. Left lung is clear. Trace left pleural effusion. No evidence of pulmonary edema. Heart size is normal. Upper  mediastinal contours are within normal limits allowing for patient positioning. Atherosclerosis in the thoracic aorta.  IMPRESSION: 1. Small complex chronic right pleural effusion with associated pleuroparenchymal scarring and/or atelectasis in the right mid to lower lung, unchanged. 2. Trace left pleural effusion. 3. Atherosclerosis.   Electronically Signed   By: Vinnie Langton M.D.   On: 09/11/2014 08:03   Dg Chest 2 View  09/10/2014   CLINICAL DATA:  Shortness of breath.  Cough.  Pneumonia.  COPD.  EXAM: CHEST  2 VIEW  COMPARISON:  09/08/2014  FINDINGS: Small right pleural effusion and right lower lobe infiltrate show no significant interval change. Left lung remains clear. No evidence of left-sided pleural effusion. Heart size remains within normal limits. Pulmonary hyperinflation again noted, consistent with COPD.  IMPRESSION: No significant change in small right pleural effusion and right lower lobe infiltrate.   Electronically Signed   By: Earle Gell M.D.   On: 09/10/2014 09:07     Events: 6/21 Admit 6/22 Rt thoracentesis 6/23 Pig tail catheter placed by DF 6/24 TCTS consulted >> poor candidate for surgery; tPA into chest tube with 1300 ml fluid out 6/25 tPA into chest tube >> not much fluid out 6/26 tPA into chest tube >> not much fluid out 6/28 IR placed  catheter in Rt pleural space 7/01 Chest tube removed  Studies: 6/22 Rt Pleural fluid >> 1 liter, LDH 341, protein 3.7, WBC 10,299 (93% N) 6/30 Rt Pleural fluid >> LDH 3452, WBC 1930 (97% N) 7/05 CT chest >> mod Rt pleural effusion with loculation, consolidation Rt base  Discussion: 81 yo male with fever, chills, night sweats from HCAP and parapneumonic effusion.  He also has COPD, A fib, and diastolic heart failure.  He had worsening dyspnea on 7/05.  CT chest shows persistence of Rt pleural effusion.  He reports his symptoms improved with BD therapy, and he has b/l wheezing on exam 7/06.  7/7 in and out of afib. [place] ib effusion  by c x r. Concerned that repeated invasive interventions will lead to complications. Assessment/Plan:  COPD exacerbation. Plan: - add pulmicort, brovana, and prn xopenex - added solumedrol 40 mg q6 h 7/6  HCAP with Rt parapneumonic pleural effusion. Not candidate for surgery.  Not sure his current symptoms are related to progression of effusion.  Cx's negative. 7/5 CT scan questions possible infection. WBC trending down and afebrile but he has CLL.  Plan: - Off Abx since 7/03 - f/u 2 view CXR 7/07 >> NSC No replacement of pig tailed chest tube.  Acute hypoxic respiratory failure. Plan: - continue oxygen to keep SpO2 90 to 95%  A fib, acute on chronic diastolic CHF, CAD s/p stent. Plan: - per primary team and cardiology, on heparin drip for new left dvt but neg ct chest for pe.  Hx of CLL. Plan: - f/u CBC   Updated pt's daughter at bedside.  PCCM will see as needed. Please call if needed. No further invasive procedures at this time.  Richardson Landry Ford Peddie ACNP Maryanna Shape PCCM Pager [phone] till 3 pm If no answer page [phone] 09/11/2014, 11:18 AM

## 2014-09-11 NOTE — Progress Notes (Signed)
Patient Name: Johnathan Strong Date of Encounter: 09/11/2014  Primary Cardiologist: Dr. Mare Ferrari   Principal Problem:   HCAP (healthcare-associated pneumonia) Active Problems:   CAD (coronary artery disease)   Atrial fibrillation   Chronic lymphatic leukemia   Pleural effusion on right   Sepsis   Pleural effusion   Pleural effusion, right   Blood poisoning   Chest tube in place   Pleural effusion associated with pulmonary infection   SOB (shortness of breath)   Essential hypertension   Respiratory failure   Dyspnea    SUBJECTIVE  Getting breathing treatment, breathing improving. Still has rhonchi and wheezing. No CP. Feeling good.  CURRENT MEDS . apixaban  10 mg Oral BID   Followed by  . [START ON 09/18/2014] apixaban  5 mg Oral BID  . arformoterol  15 mcg Nebulization BID  . atorvastatin  10 mg Oral q1800  . budesonide (PULMICORT) nebulizer solution  0.5 mg Nebulization BID  . calcium carbonate  1 tablet Oral BID  . diltiazem  360 mg Oral Daily  . feeding supplement (ENSURE ENLIVE)  237 mL Oral TID BM  . furosemide  20 mg Oral Daily  . guaiFENesin  1,200 mg Oral BID  . methylPREDNISolone (SOLU-MEDROL) injection  40 mg Intravenous Q6H  . metoprolol tartrate  50 mg Oral BID  . montelukast  10 mg Oral QHS  . polyethylene glycol  17 g Oral BID  . senna-docusate  1 tablet Oral BID  . senna-docusate  1 tablet Oral QHS  . sodium chloride  10 mL Intravenous Q12H    OBJECTIVE  Filed Vitals:   09/11/14 0032 09/11/14 0400 09/11/14 0758 09/11/14 0807  BP: 154/55 125/50 156/48   Pulse:  68 70   Temp: 97.6 F (36.4 C) 97.8 F (36.6 C) 98.2 F (36.8 C)   TempSrc: Oral Oral Oral   Resp: 22 21 20    Height:      Weight:  157 lb 12.8 oz (71.578 kg)    SpO2: 100% 97% 100% 96%    Intake/Output Summary (Last 24 hours) at 09/11/14 0828 Last data filed at 09/11/14 0700  Gross per 24 hour  Intake    360 ml  Output    450 ml  Net    -90 ml   Filed Weights   09/09/14  0500 09/09/14 2038 09/11/14 0400  Weight: 154 lb 12.8 oz (70.217 kg) 157 lb 2.2 oz (71.278 kg) 157 lb 12.8 oz (71.578 kg)    PHYSICAL EXAM  General: Pleasant, NAD. Neuro: Alert and oriented X 3. Moves all extremities spontaneously. Psych: Normal affect. HEENT:  Normal  Neck: Supple without bruits or JVD. Lungs:  Resp regular and unlabored. Bilateral wheezing and mild rhonchi this morning. Heart: irregular. no s3, s4, or murmurs. Abdomen: Soft, non-tender, non-distended, BS + x 4.  Extremities: No clubbing, cyanosis or edema. DP/PT/Radials 2+ and equal bilaterally.  Accessory Clinical Findings  CBC  Recent Labs  09/10/14 0036 09/11/14 0351  WBC 11.4* 16.3*  NEUTROABS 6.2  --   HGB 8.1* 8.0*  HCT 25.1* 25.0*  MCV 90.3 89.9  PLT 291 852   Basic Metabolic Panel  Recent Labs  09/09/14 0506 09/10/14 0036  NA 139 134*  K 4.5 4.5  CL 98* 93*  CO2 33* 30  GLUCOSE 98 225*  BUN 26* 27*  CREATININE 1.30* 1.34*  CALCIUM 8.3* 8.3*  PHOS  --  3.1    TELE A-flutter with HR 60-70s  ECG  No new EKG  Echocardiogram 04/22/2014  LV EF: 60% -  65%  ------------------------------------------------------------------- Indications:   Dyspnea 786.09.  ------------------------------------------------------------------- History:  PMH:  Coronary artery disease. Chronic obstructive pulmonary disease. Risk factors: Former tobacco use. Hypertension.  ------------------------------------------------------------------- Study Conclusions  - Left ventricle: The cavity size was normal. There was mild concentric hypertrophy. Systolic function was normal. The estimated ejection fraction was in the range of 60% to 65%. Wall motion was normal; there were no regional wall motion abnormalities. Doppler parameters are consistent with abnormal left ventricular relaxation (grade 1 diastolic dysfunction). Doppler parameters are consistent with high ventricular  filling pressure. - Mitral valve: Moderately calcified annulus. There was mild regurgitation. - Left atrium: The atrium was mildly dilated.     Radiology/Studies  Dg Chest 2 View  09/07/2014   CLINICAL DATA:  Persistent but improving shortness of breath over the course of her current hospitalization for right lower lobe pneumonia.  EXAM: CHEST  2 VIEW  COMPARISON:  09/06/2014 and earlier, including CT chest 08/30/2014.  FINDINGS: Cardiac silhouette normal in size. Thoracic aorta atherosclerotic, unchanged. Hilar and mediastinal contours otherwise unremarkable. Airspace consolidation in the right middle lobe and right lower lobe and moderate sized right pleural effusion, unchanged over the past 3 days, slightly improved since 5 days ago. Stable calcified granuloma in the medial left lower lobe. No new pulmonary parenchymal abnormalities. Pulmonary vascularity normal without evidence pulmonary edema. Degenerative changes and DISH involving the thoracic spine.  IMPRESSION: 1. Stable pneumonia and/or atelectasis in the right middle lobe and right lower lobe and stable moderate sized right pleural effusion. 2. No new abnormalities.   Electronically Signed   By: Evangeline Dakin M.D.   On: 09/07/2014 15:38   ASSESSMENT AND PLAN  1. Recurrent paroxysmal atrial fibrillation/flutter with RVR in the setting of severe lung dx  - CHADS2VASC score 6 (hypertension, age above 81, history of TIA, and vascular disease with prior coronary artery stents  - previously converted to NSR on 7/3, however went back to a-fib in the afternoon of 7/6, was in RVR 7/6, given additional 120mg  diltiazem with good rate control, converted to 360mg  daily diltiazem CD with good rate control  - cleared by critical care service to restart eliquis, given new DVT, has started DVT dosing last night which is 10mg  BID for 7 days, then 5mg  BID afterward.     2. Possible acute on chronic diastolic HF vs PNA  - s/p diuresis,  CT of chest showed RLL PNA. Afebrile, s/p short course of Levaquin and Vanc  - wean O2 as tolerated   3. HCAP: management per IM  - continue to have wheezing and rhonchi, getting breathing treatment  4. Parapneumonic effusion s/p chest tube  5. CAD s/p DES to LAD in 2014, also noted to have ostial RCA occlusion with good L to R collateral - continue BB and statin. Not on ASA given systemic anticoagulation need  - given the need for higher dose of eliquis, may consider restart plavix after 7 days on followup to avoid bleeding.  6. COPD  7. CLL [facility]  8. Anemia  9. Acute RLE DVT: found on U/S 7/6  Signed, Almyra Deforest PA-C Pager: [phone] As above, patient seen and examined. He has mild dyspnea but denies chest pain. He remains in atrial fibrillation. Continue Cardizem and metoprolol for rate control which at present is adequate. No procedures are planned for his pleural effusion. Apixaban has been resumed. Continue DVT dosing for  1 week and then resume 5 mg twice a day. Once he is on a lower dose will resume Plavix 75 mg daily for his history of drug-eluting stent. Needs significant rehabilitation. Kirk Ruths

## 2014-09-11 NOTE — Progress Notes (Signed)
Pt. And family member requested pt. Have a flutter valve. RT responded to pt. With his request and educated pt. On how to perform. No complications.

## 2014-09-12 LAB — BASIC METABOLIC PANEL
Anion gap: 10 (ref 5–15)
BUN: 38 mg/dL — ABNORMAL HIGH (ref 6–20)
CHLORIDE: 94 mmol/L — AB (ref 101–111)
CO2: 31 mmol/L (ref 22–32)
Calcium: 8.1 mg/dL — ABNORMAL LOW (ref 8.9–10.3)
Creatinine, Ser: 1.25 mg/dL — ABNORMAL HIGH (ref 0.61–1.24)
GFR calc non Af Amer: 49 mL/min — ABNORMAL LOW (ref >60)
GFR, EST AFRICAN AMERICAN: 57 mL/min — AB (ref >60)
GLUCOSE: 142 mg/dL — AB (ref 65–99)
POTASSIUM: 4.4 mmol/L (ref 3.5–5.1)
Sodium: 135 mmol/L (ref 135–145)

## 2014-09-12 LAB — CBC WITH DIFFERENTIAL/PLATELET
BASOS ABS: 0 10*3/uL (ref 0.0–0.1)
Basophils Relative: 0 % (ref 0–1)
EOS ABS: 0 10*3/uL (ref 0.0–0.7)
Eosinophils Relative: 0 % (ref 0–5)
HCT: 24.5 % — ABNORMAL LOW (ref 39.0–52.0)
Hemoglobin: 7.8 g/dL — ABNORMAL LOW (ref 13.0–17.0)
LYMPHS ABS: 5.4 10*3/uL — AB (ref 0.7–4.0)
Lymphocytes Relative: 37 % (ref 12–46)
MCH: 28.4 pg (ref 26.0–34.0)
MCHC: 31.8 g/dL (ref 30.0–36.0)
MCV: 89.1 fL (ref 78.0–100.0)
Monocytes Absolute: 0.4 10*3/uL (ref 0.1–1.0)
Monocytes Relative: 3 % (ref 3–12)
Neutro Abs: 8.8 10*3/uL — ABNORMAL HIGH (ref 1.7–7.7)
Neutrophils Relative %: 60 % (ref 43–77)
Platelets: 282 10*3/uL (ref 150–400)
RBC: 2.75 MIL/uL — ABNORMAL LOW (ref 4.22–5.81)
RDW: 22.2 % — AB (ref 11.5–15.5)
WBC: 14.6 10*3/uL — AB (ref 4.0–10.5)

## 2014-09-12 NOTE — Progress Notes (Signed)
Subjective:  Developed reduced oxygen saturations when ambulated.  Sitting up at side of bed eating lunch but mildly short of breath.  Objective:  Vital Signs in the last 24 hours: BP 158/53 mmHg  Pulse 71  Temp(Src) 97.6 F (36.4 C) (Oral)  Resp 18  Ht 5\' 8"  (1.727 m)  Wt 70.126 kg (154 lb 9.6 oz)  BMI 23.51 kg/m2  SpO2 92%  Physical Exam: Elderly male in no acute distress  Lungs:  Reduced breath sounds and rales at bases Cardiac:  Irregular rhythm, normal S1 and S2, no S3 Extremities:  No edema present  Intake/Output from previous day: 07/08 0701 - 07/09 0700 In: 1443 [P.O.:1440; I.V.:3] Out: 1025 [Urine:1025] Weight Filed Weights   09/09/14 2038 09/11/14 0400 09/12/14 0400  Weight: 71.278 kg (157 lb 2.2 oz) 71.578 kg (157 lb 12.8 oz) 70.126 kg (154 lb 9.6 oz)    Lab Results: Basic Metabolic Panel:  Recent Labs  09/10/14 0036 09/12/14 0329  NA 134* 135  K 4.5 4.4  CL 93* 94*  CO2 30 31  GLUCOSE 225* 142*  BUN 27* 38*  CREATININE 1.34* 1.25*    CBC:  Recent Labs  09/10/14 0036 09/11/14 0351 09/12/14 0329  WBC 11.4* 16.3* 14.6*  NEUTROABS 6.2  --  8.8*  HGB 8.1* 8.0* 7.8*  HCT 25.1* 25.0* 24.5*  MCV 90.3 89.9 89.1  PLT 291 288 282    BNP    Component Value Date/Time   BNP 224.5* 08/25/2014 0700   Telemetry: Currently atrial fibrillation with controlled rate  Assessment/Plan:  1.  Recurrent paroxysmal atrial fibrillation-she is currently in atrial fibrillation with rate control at the present time and is anticoagulated 2.  Pneumonia under treatment 3.  Coronary artery disease with prior stenting in 2014 4.  COPD 5.  Severe anemia  Recommendations:  Rate is currently controlled with atrial fibrillation on current regimen.  He is back on a Apixaban.  Continue DVT dosing for one week and then 5 mg daily.  Hold once he is a lower dose resume Plavix.  Stent was remote.      Kerry Hough  MD [facility]  09/12/2014, 11:52  AM

## 2014-09-12 NOTE — Progress Notes (Signed)
[facility] PROGRESS NOTE  Johnathan Strong CHY:[id number] DOB: 02-07-1924 DOA: 08/25/2014 PCP: Redge Gainer, MD  Interim Summary  10 CML, recently discharged from the medicine service on 08/19/2014, admitted on 08/09/2014 during which he was treated for community-acquired pneumonia. Patient showed gradual improvement as he was weaned off of supplemental oxygen, tolerating by mouth intake, and was discharged on Ceftin.  His wife reporting that patient did well the week after discharge, got back to his normal routine, then had a decline  CXR performed on admission revealed moderate right pleural effusion with associated right lung base consolidation/collapse.   He was started on broad-spectrum IV antibiotic therapy with vancomycin and ceftazidime, undergoing ultrasound-guided thoracentesis by interventional radiology on 08/26/2014.  Pulmonary critical care medicine was consulted regarding pleural effusion as it was felt he would benefit chest tube.  PCCM placed a 32 French chest tube on 08/27/2014 (rather than wayne cath) since there was marked fibrinous tissue and concern for loculation. Subsequent imagining showed incomplete drainage for which TPA was injected into pleural space. This seemed to help initially however subsequent injections did not yield significant fluid. PCCM recommended proceeding with IR consult for PIG-tail catheter, which was performed on 09/01/2014. Hospitalization was complicated by the moment of A. fib with RVR on 08/27/2014 likely precipitated by underlying pulmonary infectious process and acute hypoxemic respiratory failure. Controlling Afib has been a challenge. Dr Coralyn Pear consulted cardiology on 09/01/2014.  Patient was followed by cardiology and pulmonary during the hospitalization.  Patient had issues with A. fib with RVR and being hypotensive and a such difficult to titrate his rate controlling medications.and digoxin added Chest tubes were subsequently removed.  Patient subsequently converted back into normal sinus rhythm. Blood pressure improved. And heart rate is remained controlled. P atient with some bouts of shortness of breath felt to be secondary to possible volume overload.  Repeat chest x-ray with no acute abnormalities.  Patient's been given some IV Lasix and will be transitioned to oral Lasix on 09/09/2014.  Patient received 2 weeks of antibiotic treatment.  Pulmonary and critical care as well as cardiology following.   Assessment/Plan: #1 healthcare associated pneumonia with probable parapneumonic effusion D/c 08/19/2014 s/p Rx CAP who presented with a probable parapneumonic effusion. underwent ultrasound-guided thoracentesis by interventional radiology with a liter of yellow colored fluid removed with a cell count of 10,299. = exudative characteristics.  No organisms seen on Gram stain. Blood cultures-.  chest tube placed on 08/27/2014 with 1200 mL output in all 625 and 626 minimal output.   On 09/01/2014 posterior 12 French chest tube was placed per IR.  09/02/2014 patient had right lateral chest tube removed per critical care.  Treatment options  discussed -  drain catheter vs repeat thoracentesis. Pulmonary medicine gave TPA injections into pleural space due to amount of fibrous material and incomplete fluid drainage from the chest tube.  Patient did have a repeat chest CT that showed interval improvement in right-sided pleural effusion.  Patient was evaluated by Dr. Roxan Hockey of cardiothoracic surgery who did not recommend a VATS procedure.  Patient had pigtail catheter placement done on 09/01/2014 per interventional radiology.  Second chest tube was removed.  Repeat chest x-ray done 09/08/14 with no interval change in the chest.  Persistent right basilar collapse/consolidation and effusion. Clinical improvement.  Pulmonology has weighed in on 09/10/14-no further acute interventions for Chest tube plaements -continue  Brovana/pulmicort and Xopenex -transition off IV steroids 24 hours and reassess on PO prednisone long taper -will need OP pulm  f/u in ~ 1 week which has been arranged -will likely have long and slow convalescence and is realtively a high risk for re-admission    RLE DVT -found on Doppler LE's on 7/6 -IV heparin started ---> BID Elliquis-Elliquis 10 mg bid till 7/15, then 5mg  bid subsequently -CT angio non suggestive PE  #2 acute hypoxemic respiratory failure-Likely multifactorial secondary to problem #1. Improving slowly.  Patient with some shortness of breath on ambulation to the bathroom without his oxygen. . Was on IV and transitioned to oral Lasix 20 mg daily tomorrow.  Patient is status post 2 weeks of antibiotics.  desat screen reveals needs Oxygen on d/c  #3 A. fib with RVR on Elliquis CHADS2VASC score 6. Patient was noted to have heart rates in the 140s earlier in admit Patient has flip-flopped between afib and NSR 2/2 patient's pulmonary process vs use of albuterol Patient has been seen in consultation by cardiology were adjusting patient's rate controlling medications.  Patient blood pressure with some improvement.  Metoprolol dose has been increased to 50 mg twice daily per cardiology 7/1 Diltiazem had been decreased to 240 mg daily-->2/2 to RVR ? 360 on 09/10/14 Digoxin 0.125 d/c 7/6 Per cardiology.  #4 coronary artery disease status post DES to the LAD in 2014 Stable. Continue beta blocker, statin.  Plavix to re-start once Elliquis dosing is 5 mg bid [on 7/15]  #5 acute on chronic diastolic heart failure Patient noted to have intermittent shortness of breath -Seems Euvolemic -Lasix currently at 20 daily  #6 sepsis Noted on admission as patient had a white count of 24.2, respiratory rate of 33 heart rate of 107 with source of infection being healthcare associated pneumonia. Blood cultures NEG Pleural fluid cultures NEG.  Patient status post 2 weeks of empiric  antibiotics.    #7 hypertension See above meds    #8 acute kidney injury Improvement. Stable. Follow.  #9 leukocytosis now 2/2 to Steroid use WBC24->17>14 -->11.4-->16.3-->14.6 Patient is afebrile. Patient denies any diarrhea. Patient denies any dysuria.  Repeat chest x-ray 7/8= small complex effusion unchanged Patient status post approximately 2 weeks of antibiotics which have been discontinued.  No further antibiotics needed at this time.   #10 CLL Outpatient follow-up. Likely contributes to leukocytosis  #11 prophylaxis SCDs for DVT prophylaxis.  Code Status: Full Family Communication: Updated patient, wife and daughter at bedside. Disposition Plan: Home with home health when medically stable.  Consultants:  Cardiology: Dr. Haroldine Laws 09/01/2014  CT surgery Dr. Roxan Hockey 08/28/2014  Critical care medicine Dr. Alva Garnet 08/26/2014  Procedures:  Ultrasound-guided thoracentesis 08/26/2014  CT guided drainage catheter placement into caudal aspect of right pleural space per Dr. Pascal Lux 09/01/2014  Right chest tube placement 08/27/2014  CT chest 08/30/2014, 08/25/2014  Right Chest tube removal per interventional radiology 09/04/2014  Significant tests/ events 6/23 A. fib RVR- transferred to stepdown unit 6/23 [place] of right chest - marked fibrinous tissue and concern for loculation t/o the right chest wall Rt chest tube 6/23 >>TPA 6/24: 1200 ml output, on 6/25 and 6/26 minimal output.  CT chest 6/26: Interval improvement in a moderate size right pleural fluid collection with associated compressive atelectasis over the right middle and lower lobes. Right-sided chest tube in with tip over the posterior pleural space in the most superior aspect of the pleural fluid collection. 6/28: posterior 12 fr chest tube placed in IR  6/29: right lateral 32 fr CT removed.  7/1 posterior chest tube removed  Antibiotics:  IV Fortaz 08/25/2014>>>>>>09/03/14  IV  vancomycin  08/25/2014>>>> 09/01/2014  Oral levaquin 09/03/14>>>>> 09/06/2014  HPI/Subjective:  SOB today with ambulation today A little dejected after walking off of O2 otherwsie well Eating ok No cp Tele shows R controlled Afib   Objective: Filed Vitals:   09/12/14 1151  BP: 158/53  Pulse: 71  Temp: 97.6 F (36.4 C)  Resp: 18    Intake/Output Summary (Last 24 hours) at 09/12/14 1428 Last data filed at 09/12/14 1238  Gross per 24 hour  Intake   1200 ml  Output    825 ml  Net    375 ml   Filed Weights   09/09/14 2038 09/11/14 0400 09/12/14 0400  Weight: 71.278 kg (157 lb 2.2 oz) 71.578 kg (157 lb 12.8 oz) 70.126 kg (154 lb 9.6 oz)    Exam:   General:  NAD  Cardiovascular: RRR  Respiratory: Decreased breath sounds in the right base, otherwise clear.  Abdomen: Soft, nontender, nondistended, positive bowel sounds.  Musculoskeletal: No clubbing cyanosis or edema.  Data Reviewed: Basic Metabolic Panel:  Recent Labs Lab 09/07/14 0446 09/08/14 0448 09/09/14 0506 09/10/14 0036 09/12/14 0329  NA 135 137 139 134* 135  K 4.7 4.5 4.5 4.5 4.4  CL 99* 101 98* 93* 94*  CO2 29 25 33* 30 31  GLUCOSE 136* 92 98 225* 142*  BUN 33* 34* 26* 27* 38*  CREATININE 1.43* 1.19 1.30* 1.34* 1.25*  CALCIUM 8.2* 8.0* 8.3* 8.3* 8.1*  PHOS  --   --   --  3.1  --    Liver Function Tests:  Recent Labs Lab 09/10/14 0036  ALBUMIN 2.3*   No results for input(s): LIPASE, AMYLASE in the last 168 hours. No results for input(s): AMMONIA in the last 168 hours. CBC:  Recent Labs Lab 09/08/14 0448 09/09/14 0506 09/10/14 0036 09/11/14 0351 09/12/14 0329  WBC 17.5* 14.0* 11.4* 16.3* 14.6*  NEUTROABS 10.7*  --  6.2  --  8.8*  HGB 8.2* 8.2* 8.1* 8.0* 7.8*  HCT 25.3* 25.8* 25.1* 25.0* 24.5*  MCV 90.7 89.9 90.3 89.9 89.1  PLT 340 317 291 288 282   Cardiac Enzymes: No results for input(s): CKTOTAL, CKMB, CKMBINDEX, TROPONINI in the last 168 hours. BNP (last 3 results)  Recent Labs   04/21/14 1541 08/25/14 0700  BNP 484.0* 224.5*    ProBNP (last 3 results) No results for input(s): PROBNP in the last 8760 hours.  CBG: No results for input(s): GLUCAP in the last 168 hours.  No results found for this or any previous visit (from the past 240 hour(s)).   Studies: Dg Chest 2 View  09/11/2014   CLINICAL DATA:  81 year old male with right pleural effusion and shortness of breath.  EXAM: CHEST  2 VIEW  COMPARISON:  Chest x-ray 09/10/2014.  FINDINGS: Small chronic right-sided pleural effusion is unchanged compared to recent priors. This is again associated with adjacent pleuroparenchymal scarring throughout the right mid to lower lung, similar to the recent prior examination. The possibility of consolidative airspace disease in the right middle and/or right lower lobe is not excluded, but is not strongly favored. Left lung is clear. Trace left pleural effusion. No evidence of pulmonary edema. Heart size is normal. Upper mediastinal contours are within normal limits allowing for patient positioning. Atherosclerosis in the thoracic aorta.  IMPRESSION: 1. Small complex chronic right pleural effusion with associated pleuroparenchymal scarring and/or atelectasis in the right mid to lower lung, unchanged. 2. Trace left pleural effusion. 3. Atherosclerosis.   Electronically Signed  By: Vinnie Langton M.D.   On: 09/11/2014 08:03    Scheduled Meds: . apixaban  10 mg Oral BID   Followed by  . [START ON 09/18/2014] apixaban  5 mg Oral BID  . arformoterol  15 mcg Nebulization BID  . atorvastatin  10 mg Oral q1800  . budesonide (PULMICORT) nebulizer solution  0.5 mg Nebulization BID  . calcium carbonate  1 tablet Oral BID  . diltiazem  360 mg Oral Daily  . feeding supplement (ENSURE ENLIVE)  237 mL Oral TID BM  . furosemide  20 mg Oral Daily  . guaiFENesin  1,200 mg Oral BID  . metoprolol tartrate  50 mg Oral BID  . montelukast  10 mg Oral QHS  . nystatin  5 mL Mouth/Throat QID  .  polyethylene glycol  17 g Oral BID  . predniSONE  60 mg Oral Q breakfast  . senna-docusate  1 tablet Oral BID  . senna-docusate  1 tablet Oral QHS  . sodium chloride  10 mL Intravenous Q12H   Continuous Infusions:    Principal Problem:   HCAP (healthcare-associated pneumonia) Active Problems:   CAD (coronary artery disease)   Atrial fibrillation   Chronic lymphatic leukemia   Pleural effusion on right   Sepsis   Pleural effusion   Pleural effusion, right   Blood poisoning   Chest tube in place   Pleural effusion associated with pulmonary infection   SOB (shortness of breath)   Essential hypertension   Respiratory failure   Dyspnea    Time spent: 25 minutes Verneita Griffes, MD [facility] (P) [phone]

## 2014-09-12 NOTE — Progress Notes (Signed)
SATURATION QUALIFICATIONS: (This note is used to comply with regulatory documentation for home oxygen)  Patient Saturations on Room Air at Rest =96%  Patient Saturations on Room Air while Ambulating = 82 - 88%  Patient Saturations on 2 Liters of oxygen while Ambulating =84 - 92%  Please briefly explain why patient needs home oxygen:Pt O2 saturation on room air was at 82 - 88%. Pt indicated he is " a little bit short winded ".

## 2014-09-13 ENCOUNTER — Inpatient Hospital Stay (HOSPITAL_COMMUNITY): Payer: Medicare Other

## 2014-09-13 MED ORDER — MORPHINE SULFATE (CONCENTRATE) 10 MG/0.5ML PO SOLN
5.0000 mg | ORAL | Status: DC | PRN
  Administered 2014-09-13 – 2014-09-14 (×2): 5 mg via ORAL
  Filled 2014-09-13 (×3): qty 0.5

## 2014-09-13 MED ORDER — FUROSEMIDE 10 MG/ML IJ SOLN
INTRAMUSCULAR | Status: AC
  Filled 2014-09-13: qty 2

## 2014-09-13 MED ORDER — FUROSEMIDE 10 MG/ML IJ SOLN
20.0000 mg | Freq: Once | INTRAMUSCULAR | Status: AC
  Administered 2014-09-13: 20 mg via INTRAVENOUS

## 2014-09-13 NOTE — Progress Notes (Signed)
Subjective:  Atrial fibrillation has been under reasonable control but developed increasing respiratory distress this morning and given breathing treatment.  Currently atrial fibrillation rate is uncontrolled.  He is back in bed and is feeling better but still significant wheezing and atrial fibrillation rate quite fast.  Objective:  Vital Signs in the last 24 hours: BP 179/125 mmHg  Pulse 126  Temp(Src) 97.5 F (36.4 C) (Oral)  Resp 18  Ht 5\' 8"  (1.727 m)  Wt 70.171 kg (154 lb 11.2 oz)  BMI 23.53 kg/m2  SpO2 97%  Physical Exam: Elderly male in no acute distress  Lungs:  Reduced breath sounds and rales at bases Cardiac: Rapid  Irregular rhythm, normal S1 and S2, no S3 Extremities:  No edema present  Intake/Output from previous day: 07/09 0701 - 07/10 0700 In: 840 [P.O.:840] Out: 525 [Urine:525] Weight Filed Weights   09/11/14 0400 09/12/14 0400 09/13/14 0400  Weight: 71.578 kg (157 lb 12.8 oz) 70.126 kg (154 lb 9.6 oz) 70.171 kg (154 lb 11.2 oz)    Lab Results: Basic Metabolic Panel:  Recent Labs  09/12/14 0329  NA 135  K 4.4  CL 94*  CO2 31  GLUCOSE 142*  BUN 38*  CREATININE 1.25*    CBC:  Recent Labs  09/11/14 0351 09/12/14 0329  WBC 16.3* 14.6*  NEUTROABS  --  8.8*  HGB 8.0* 7.8*  HCT 25.0* 24.5*  MCV 89.9 89.1  PLT 288 282    BNP    Component Value Date/Time   BNP 224.5* 08/25/2014 0700   Telemetry: Currently atrial fibrillation with controlled rate  Assessment/Plan:  1.  Recurrent paroxysmal atrial fibrillation-he is currently uncontrolled and suspect this may be due to his respiratory decompensation. 2.  Pneumonia under treatment 3.  Coronary artery disease with prior stenting in 2014 4.  COPD 5.  Severe anemia  Recommendations:  Currently rate not well controlled.  Would go ahead and give him his usual dose of medicine this morning.  May need intravenous diltiazem if he doesn't respond to his oral medications this morning.  Continue  anticoagulation.  Need to recheck anemia as severe anemia may be exacerbating his respiratory and his cardiac status.   Kerry Hough  MD [facility]  09/13/2014, 8:54 AM

## 2014-09-13 NOTE — Progress Notes (Signed)
[facility] PROGRESS NOTE  TREY GULBRANSON VOZ:[id number] DOB: 1923-03-07 DOA: 08/25/2014 PCP: Redge Gainer, MD  Interim Summary  73 CML, d/c 08/19/2014, admitted on 08/09/2014 during which he was treated for community-acquired pneumonia. Patient showed gradual improvement as he was weaned off of supplemental oxygen, tolerating by mouth intake, and was discharged on Ceftin.  did well the week after discharge, got back to his normal routine, then had a decline CXR performed on admission revealed moderate right pleural effusion with associated right lung base consolidation/collapse.   He was started on broad-spectrum IV antibiotic therapy with vancomycin and ceftazidime, undergoing ultrasound-guided thoracentesis by interventional radiology on 08/26/2014.  Pulmonary critical care medicine was consulted regarding pleural effusion as it was felt he would benefit chest tube.  PCCM placed a 32 French chest tube on 08/27/2014 (rather than wayne cath) since there was marked fibrinous tissue and concern for loculation. Subsequent imagining showed incomplete drainage for which TPA was injected into pleural space. This seemed to help initially however subsequent injections did not yield significant fluid.  PCCM recommended proceeding with IR consult for PIG-tail catheter, which was performed on 09/01/2014. Complicated by A. fib with RVR on 08/27/2014 likely precipitated by underlying pulmonary infectious process and acute hypoxemic respiratory failure.  Consulted cardiology on 09/01/2014.[name]  Patient had issues with A. fib with RVR and being hypotensive Chest tubes were subsequently removed.   Patient with some bouts of shortness of breath felt to be secondary to possible volume overload.    Assessment/Plan: #1 healthcare associated pneumonia with probable parapneumonic effusion D/c 08/19/2014 s/p Rx CAP who presented with a probable parapneumonic effusion. underwent ultrasound-guided thoracentesis  by interventional radiology with a 1L of yellow colored fluid removed with a cell count of 10,299. = exudative characteristics.  No organisms seen on Gram stain. Blood cultures-.  chest tube placed on 08/27/2014 with 1200 mL output in all 625 and 626 minimal output.   On 09/01/2014 posterior 12 French chest tube was placed per IR.  09/02/2014 patient had right lateral chest tube removed per critical care.  Treatment options  discussed -  drain catheter vs repeat thoracentesis. Pulmonary medicine gave TPA injections into pleural space due to amount of fibrous material and incomplete fluid drainage from the chest tube.  Patient did have a repeat chest CT that showed interval improvement in right-sided pleural effusion.  Patient was evaluated by Dr. Roxan Hockey of cardiothoracic surgery who did not recommend a VATS procedure.  Patient had pigtail catheter placement done on 09/01/2014 per interventional radiology. Second chest tube was removed.  Repeat chest x-ray done 09/08/14 with no interval change in the chest.  Persistent right basilar collapse/consolidation and effusion. Clinical improvement.  Pulmonology has weighed in on 09/10/14-no further acute interventions for Chest tube plaements -continue Brovana/pulmicort and Xopenex -transition off IV steroids  7/9 re-ssess on PO prednisone long taper -recurrent episodic decompensation culminated in Palliative care consult  RLE DVT -found on Doppler LE's on 7/6 -IV heparin started ---> BID Elliquis-Elliquis 10 mg bid till 7/15, then 5mg  bid subsequently -CT angio non suggestive PE  #2 acute hypoxemic respiratory failure  -Likely multifactorial secondary to problem #1. Improving slowly.  Patient with some shortness of breath on ambulation to the bathroom without his oxygen. . Was on IV and transitioned to oral Lasix 20 mg daily tomorrow.  Patient is status post 2 weeks of antibiotics.  desat screen reveals needs Oxygen on d/c  #3 A. fib with  RVR on Elliquis CHADS2VASC score 6. Patient  was noted to have heart rates in the 140s earlier in admit Patient has flip-flopped between afib and NSR 2/2 patient's pulmonary process vs use of albuterol Patient has been seen in consultation by cardiology were adjusting patient's rate controlling medications.  Patient blood pressure with some improvement.  Metoprolol dose has been increased to 50 mg twice daily per cardiology 7/1 Diltiazem had been decreased to 240 mg daily-->2/2 to RVR ? 360 on 09/10/14 Digoxin 0.125 d/c 7/6 Per cardiology.  #4 coronary artery disease status post DES to the LAD in 2014 Stable. Continue beta blocker, statin.  Plavix to re-start once Elliquis dosing is 5 mg bid [on 7/15]  #5 acute on chronic diastolic heart failure Patient noted to have intermittent shortness of breath -Seems Euvolemic -Lasix currently at 20 daily  #6 sepsis Noted on admission as patient had a white count of 24.2, respiratory rate of 33 heart rate of 107 with source of infection being healthcare associated pneumonia. Blood cultures NEG Pleural fluid cultures NEG.  Patient status post 2 weeks of empiric antibiotics.    #7 hypertension See above meds    #8 acute kidney injury c Cardio-renal syndomre -Needs Diuresis -he has had recurrent episodic SOB -Diuresis seems to have helped his latest episde but this might be at the expense of his kidneys -Pallaitive care to delineate goals   #9 leukocytosis now 2/2 to Steroid use WBC24->17>14 -->11.4-->16.3-->14.6 Patient is afebrile. Patient denies any diarrhea. Patient denies any dysuria.  Repeat chest x-ray 7/8= small complex effusion unchanged Patient status post approximately 2 weeks of antibiotics which have been discontinued.  No further antibiotics needed at this time.   #10 CLL Outpatient follow-up. Likely contributes to leukocytosis  #11 prophylaxis SCDs for DVT prophylaxis.  Code Status: Full Family Communication: Updated  patient, wife and daughter at bedside. Disposition Plan: Home with home health when medically stable.  Consultants:  Cardiology: Dr. Haroldine Laws 09/01/2014  CT surgery Dr. Roxan Hockey 08/28/2014  Critical care medicine Dr. Alva Garnet 08/26/2014  Procedures:  Ultrasound-guided thoracentesis 08/26/2014  CT guided drainage catheter placement into caudal aspect of right pleural space per Dr. Pascal Lux 09/01/2014  Right chest tube placement 08/27/2014  CT chest 08/30/2014, 08/25/2014  Right Chest tube removal per interventional radiology 09/04/2014  Significant tests/ events 6/23 A. fib RVR- transferred to stepdown unit 6/23 [place] of right chest - marked fibrinous tissue and concern for loculation t/o the right chest wall Rt chest tube 6/23 >>TPA 6/24: 1200 ml output, on 6/25 and 6/26 minimal output.  CT chest 6/26: Interval improvement in a moderate size right pleural fluid collection with associated compressive atelectasis over the right middle and lower lobes. Right-sided chest tube in with tip over the posterior pleural space in the most superior aspect of the pleural fluid collection. 6/28: posterior 12 fr chest tube placed in IR  6/29: right lateral 32 fr CT removed.  7/1 posterior chest tube removed  Antibiotics:  IV Fortaz 08/25/2014>>>>>>09/03/14  IV vancomycin 08/25/2014>>>> 09/01/2014  Oral levaquin 09/03/14>>>>> 09/06/2014  HPI/Subjective:  Significantly SOB this am with ? wob Improved after lasix IV Wheezy even after 2 rx as per my prior note Pulm input appreciated   Objective: Filed Vitals:   09/13/14 1214  BP:   Pulse: 73  Temp: 97.8 F (36.6 C)  Resp:     Intake/Output Summary (Last 24 hours) at 09/13/14 1245 Last data filed at 09/13/14 1214  Gross per 24 hour  Intake    780 ml  Output   1775  ml  Net   -995 ml   Filed Weights   09/11/14 0400 09/12/14 0400 09/13/14 0400  Weight: 71.578 kg (157 lb 12.8 oz) 70.126 kg (154 lb 9.6 oz) 70.171 kg (154 lb  11.2 oz)    Exam:   General:  NAD  Cardiovascular: RRR  Respiratory: Decreased breath sounds in the right base, otherwise clear.  Abdomen: Soft, nontender, nondistended, positive bowel sounds.  Musculoskeletal: No clubbing cyanosis or edema.  Data Reviewed: Basic Metabolic Panel:  Recent Labs Lab 09/07/14 0446 09/08/14 0448 09/09/14 0506 09/10/14 0036 09/12/14 0329  NA 135 137 139 134* 135  K 4.7 4.5 4.5 4.5 4.4  CL 99* 101 98* 93* 94*  CO2 29 25 33* 30 31  GLUCOSE 136* 92 98 225* 142*  BUN 33* 34* 26* 27* 38*  CREATININE 1.43* 1.19 1.30* 1.34* 1.25*  CALCIUM 8.2* 8.0* 8.3* 8.3* 8.1*  PHOS  --   --   --  3.1  --    Liver Function Tests:  Recent Labs Lab 09/10/14 0036  ALBUMIN 2.3*   No results for input(s): LIPASE, AMYLASE in the last 168 hours. No results for input(s): AMMONIA in the last 168 hours. CBC:  Recent Labs Lab 09/08/14 0448 09/09/14 0506 09/10/14 0036 09/11/14 0351 09/12/14 0329  WBC 17.5* 14.0* 11.4* 16.3* 14.6*  NEUTROABS 10.7*  --  6.2  --  8.8*  HGB 8.2* 8.2* 8.1* 8.0* 7.8*  HCT 25.3* 25.8* 25.1* 25.0* 24.5*  MCV 90.7 89.9 90.3 89.9 89.1  PLT 340 317 291 288 282   Cardiac Enzymes: No results for input(s): CKTOTAL, CKMB, CKMBINDEX, TROPONINI in the last 168 hours. BNP (last 3 results)  Recent Labs  04/21/14 1541 08/25/14 0700  BNP 484.0* 224.5*    ProBNP (last 3 results) No results for input(s): PROBNP in the last 8760 hours.  CBG: No results for input(s): GLUCAP in the last 168 hours.  No results found for this or any previous visit (from the past 240 hour(s)).   Studies: Dg Chest 2 View  09/13/2014   CLINICAL DATA:  Shortness of breath. Coronary artery disease. COPD.  EXAM: CHEST  2 VIEW  COMPARISON:  09/11/2014  FINDINGS: Small right pleural effusion again seen with associated atelectasis or scarring in the right lung base. These findings are stable since previous study.  No new or worsening areas of pulmonary opacity  seen. Left lung remains clear. Heart size is within normal limits. Pulmonary hyperinflation again seen, consistent with COPD.  IMPRESSION: Stable appearance of small right pleural effusion and associated right basilar atelectasis versus scarring.  COPD.   Electronically Signed   By: Earle Gell M.D.   On: 09/13/2014 11:01    Scheduled Meds: . apixaban  10 mg Oral BID   Followed by  . [START ON 09/18/2014] apixaban  5 mg Oral BID  . arformoterol  15 mcg Nebulization BID  . atorvastatin  10 mg Oral q1800  . budesonide (PULMICORT) nebulizer solution  0.5 mg Nebulization BID  . calcium carbonate  1 tablet Oral BID  . diltiazem  360 mg Oral Daily  . feeding supplement (ENSURE ENLIVE)  237 mL Oral TID BM  . furosemide      . guaiFENesin  1,200 mg Oral BID  . metoprolol tartrate  50 mg Oral BID  . montelukast  10 mg Oral QHS  . nystatin  5 mL Mouth/Throat QID  . polyethylene glycol  17 g Oral BID  . predniSONE  60 mg  Oral Q breakfast  . senna-docusate  1 tablet Oral BID  . senna-docusate  1 tablet Oral QHS  . sodium chloride  10 mL Intravenous Q12H   Continuous Infusions:    Principal Problem:   HCAP (healthcare-associated pneumonia) Active Problems:   CAD (coronary artery disease)   Atrial fibrillation   Chronic lymphatic leukemia   Pleural effusion on right   Sepsis   Pleural effusion   Pleural effusion, right   Blood poisoning   Chest tube in place   Pleural effusion associated with pulmonary infection   SOB (shortness of breath)   Essential hypertension   Respiratory failure   Dyspnea    Time spent: 45 minutes Verneita Griffes, MD [facility] (P) [phone]

## 2014-09-13 NOTE — Significant Event (Signed)
Called by RN regarding Acute decompensation and SOB Received 2 nebs this am, last one ~ 08:00 No CP Was getting to sit on the bedpan  O/e  Visibly ? WOB, but able to talk in full sentences Wheezy throughout, no rales, no rhonhi, no TVR, no TVF Tachy 120's No JVD abd soft NT Nd  P- get Stat 2 vw CXr [r/o worsening effusion] O2 sat 97% Given IV lasix 20 now Review CXR later this am Consider switching back to IV steroids  Appreciate Dr. Joya Gaskins input in advance   Johnathan Griffes, MD [facility] (P) [phone]

## 2014-09-13 NOTE — Progress Notes (Signed)
PCCM NOTE  Subjective: Worse, more dyspnea from 7/8  Objective: BP 179/125 mmHg  Pulse 126  Temp(Src) 97.5 F (36.4 C) (Oral)  Resp 18  Ht 5\' 8"  (1.727 m)  Wt 70.171 kg (154 lb 11.2 oz)  BMI 23.53 kg/m2  SpO2 97%  General: mild increase WOB. nsc  HEENT: no sinus tenderness Cardiac: regular currently sinus Chest: faint b/l wheeze, decreased bs Rt lung 1/2 way up  Abd: soft, non tender Ext: no edema Neuro: normal strength   CMP Latest Ref Rng 09/12/2014 09/10/2014 09/09/2014  Glucose 65 - 99 mg/dL 142(H) 225(H) 98  BUN 6 - 20 mg/dL 38(H) 27(H) 26(H)  Creatinine 0.61 - 1.24 mg/dL 1.25(H) 1.34(H) 1.30(H)  Sodium 135 - 145 mmol/L 135 134(L) 139  Potassium 3.5 - 5.1 mmol/L 4.4 4.5 4.5  Chloride 101 - 111 mmol/L 94(L) 93(L) 98(L)  CO2 22 - 32 mmol/L 31 30 33(H)  Calcium 8.9 - 10.3 mg/dL 8.1(L) 8.3(L) 8.3(L)  Total Protein 6.5 - 8.1 g/dL - - -  Albumin 3.5 - 4.7 g/dL - - -  Total Bilirubin 0.3 - 1.2 mg/dL - - -  Alkaline Phos 38 - 126 U/L - - -  AST 15 - 41 U/L - - -  ALT 17 - 63 U/L - - -    CBC Latest Ref Rng 09/12/2014 09/11/2014 09/10/2014  WBC 4.0 - 10.5 K/uL 14.6(H) 16.3(H) 11.4(H)  Hemoglobin 13.0 - 17.0 g/dL 7.8(L) 8.0(L) 8.1(L)  Hematocrit 39.0 - 52.0 % 24.5(L) 25.0(L) 25.1(L)  Platelets 150 - 400 K/uL 282 288 291    No results found.   Events: 6/21 Admit 6/22 Rt thoracentesis 6/23 Pig tail catheter placed by DF 6/24 TCTS consulted >> poor candidate for surgery; tPA into chest tube with 1300 ml fluid out 6/25 tPA into chest tube >> not much fluid out 6/26 tPA into chest tube >> not much fluid out 6/28 IR placed catheter in Rt pleural space 7/01 Chest tube removed  Studies: 6/22 Rt Pleural fluid >> 1 liter, LDH 341, protein 3.7, WBC 10,299 (93% N) 6/30 Rt Pleural fluid >> LDH 3452, WBC 1930 (97% N) 7/05 CT chest >> mod Rt pleural effusion with loculation, consolidation Rt base  Discussion: 81 yo male with fever, chills, night sweats from HCAP and parapneumonic  effusion.  He also has COPD, A fib, and diastolic heart failure.  He had worsening dyspnea on 7/05.  CT chest shows persistence of Rt pleural effusion. Pt improved now is worse.  Not a surgical candidate  7/7 in and out of afib. [place] ib effusion by c x r. Concerned that repeated invasive interventions will lead to complications. Assessment/Plan:  COPD exacerbation. Plan: - cont  pulmicort, brovana, and prn xopenex - cont  solumedrol 40 mg q6 h   HCAP with Rt parapneumonic pleural effusion. Not candidate for surgery.  Not sure his current symptoms are related to progression of effusion.  Cx's negative. 7/5 CT scan questions possible infection. WBC trending down and afebrile but he has CLL.  Plan: - Off Abx since 7/03 - f/u CXR 7/10, may need to tap effusion again  ?? pleuryx catheter   Acute hypoxic respiratory failure. Plan: - continue oxygen to keep SpO2 90 to 95%  A fib, acute on chronic diastolic CHF, CAD s/p stent. Plan: - per primary team and cardiology, on heparin drip for new left dvt but neg ct chest for pe.  Hx of CLL. Plan: - f/u CBC   Updated pt's daughter  at bedside.  Mariel Sleet Beeper  [phone]  Cell  [phone]  If no response or cell goes to voicemail, call beeper [phone]  09/13/2014, 9:22 AM

## 2014-09-13 NOTE — Progress Notes (Addendum)
Met again c patient and family again this afternoon at around 1600 I drew some diagrams on the white board and discussed with the patient that I feel that his symptoms are manageable but his ultimate multifactorial adult failure to thrive is not curable I feel there is a slim chance that he will recoverr but I am not completely sure if he will make a full recovery and he has a very high risk for cardiorenal and cardiopulmonary decline He is amenable to trying Roxanol low-dose for amelioration of his work of breathing and anxiety with breathing I have encouraged him to ask for breathing treatments when he feels he needs it objectively as this may be playing into his AFIb xc RVR and we discusssed use of anxiolytic vs Roxanol to help contorl the anxiety around the 2 hour mark when he states he feels " Like he should ask for a breahting Rx" and he may need some counseling regarding this t he tells me categorically does not want to be intubated or cardioverted I had a long discussion in the presence of his daughter Butch Penny who is a healthcare power of attorney and she clearly understands the multiple issues at play  Total time 30 minutes  Verneita Griffes, MD [facility] [phone]

## 2014-09-14 DIAGNOSIS — J869 Pyothorax without fistula: Secondary | ICD-10-CM

## 2014-09-14 DIAGNOSIS — Z515 Encounter for palliative care: Secondary | ICD-10-CM | POA: Insufficient documentation

## 2014-09-14 DIAGNOSIS — J9600 Acute respiratory failure, unspecified whether with hypoxia or hypercapnia: Secondary | ICD-10-CM | POA: Insufficient documentation

## 2014-09-14 LAB — CBC WITH DIFFERENTIAL/PLATELET
BASOS ABS: 0 10*3/uL (ref 0.0–0.1)
Basophils Relative: 0 % (ref 0–1)
Eosinophils Absolute: 0 10*3/uL (ref 0.0–0.7)
Eosinophils Relative: 0 % (ref 0–5)
HCT: 31.2 % — ABNORMAL LOW (ref 39.0–52.0)
HEMOGLOBIN: 10 g/dL — AB (ref 13.0–17.0)
Lymphocytes Relative: 36 % (ref 12–46)
Lymphs Abs: 9.3 10*3/uL — ABNORMAL HIGH (ref 0.7–4.0)
MCH: 28.7 pg (ref 26.0–34.0)
MCHC: 32.1 g/dL (ref 30.0–36.0)
MCV: 89.4 fL (ref 78.0–100.0)
Monocytes Absolute: 0.8 10*3/uL (ref 0.1–1.0)
Monocytes Relative: 3 % (ref 3–12)
NEUTROS ABS: 15.8 10*3/uL — AB (ref 1.7–7.7)
NEUTROS PCT: 61 % (ref 43–77)
Platelets: 302 10*3/uL (ref 150–400)
RBC: 3.49 MIL/uL — ABNORMAL LOW (ref 4.22–5.81)
RDW: 22.3 % — AB (ref 11.5–15.5)
WBC: 25.9 10*3/uL — ABNORMAL HIGH (ref 4.0–10.5)

## 2014-09-14 MED ORDER — PREDNISONE 10 MG PO TABS: 10.0000 mg | ORAL_TABLET | Freq: Every day | ORAL | Status: DC

## 2014-09-14 MED ORDER — FUROSEMIDE 20 MG PO TABS
20.0000 mg | ORAL_TABLET | Freq: Two times a day (BID) | ORAL | Status: AC
Start: 2014-09-14 — End: 2014-09-14
  Administered 2014-09-14: 20 mg via ORAL
  Filled 2014-09-14: qty 1

## 2014-09-14 MED ORDER — PREDNISONE 20 MG PO TABS
30.0000 mg | ORAL_TABLET | Freq: Every day | ORAL | Status: DC
  Administered 2014-09-15: 30 mg via ORAL
  Filled 2014-09-14 (×2): qty 1

## 2014-09-14 MED ORDER — FUROSEMIDE 40 MG PO TABS
40.0000 mg | ORAL_TABLET | Freq: Every day | ORAL | Status: AC
  Administered 2014-09-14: 40 mg via ORAL
  Filled 2014-09-14: qty 1

## 2014-09-14 MED ORDER — PREDNISONE 20 MG PO TABS: 20.0000 mg | ORAL_TABLET | Freq: Every day | ORAL | Status: DC

## 2014-09-14 NOTE — Progress Notes (Signed)
OT Cancellation Note  Patient Details Name: MAAHIR HORST MRN: [id number] DOB: 07-17-1923   Cancelled Treatment:    Reason Eval/Treat Not Completed: Other (comment). Pt had visitors earlier and now Palliative care meeting with pt/family. Will try to come back as time allows.  Benito Mccreedy OTR/L [phone] 09/14/2014, 12:04 PM

## 2014-09-14 NOTE — Progress Notes (Signed)
PCCM NOTE  Subjective: Remains SOB, less so than 7/10.  He feels he cannot take a deep breath  Objective: BP 156/63 mmHg  Pulse 84  Temp(Src) 98.1 F (36.7 C) (Oral)  Resp 18  Ht 5\' 8"  (1.727 m)  Wt 70.444 kg (155 lb 4.8 oz)  BMI 23.62 kg/m2  SpO2 96%  General: awake and appropriate HEENT: no sinus tenderness Cardiac: regular currently sinus Chest: no wheeze on exam, decreased bs Rt lung 1/2 way up  Abd: soft, non tender Ext: no edema   Recent Labs Lab 09/10/14 0036 09/11/14 0351 09/12/14 0329  HGB 8.1* 8.0* 7.8*  HCT 25.1* 25.0* 24.5*  WBC 11.4* 16.3* 14.6*  PLT 291 288 282    Recent Labs Lab 09/08/14 0448 09/09/14 0506 09/10/14 0036 09/12/14 0329  NA 137 139 134* 135  K 4.5 4.5 4.5 4.4  CL 101 98* 93* 94*  CO2 25 33* 30 31  GLUCOSE 92 98 225* 142*  BUN 34* 26* 27* 38*  CREATININE 1.19 1.30* 1.34* 1.25*  CALCIUM 8.0* 8.3* 8.3* 8.1*  PHOS  --   --  3.1  --      Dg Chest 2 View  09/13/2014   CLINICAL DATA:  Shortness of breath. Coronary artery disease. COPD.  EXAM: CHEST  2 VIEW  COMPARISON:  09/11/2014  FINDINGS: Small right pleural effusion again seen with associated atelectasis or scarring in the right lung base. These findings are stable since previous study.  No new or worsening areas of pulmonary opacity seen. Left lung remains clear. Heart size is within normal limits. Pulmonary hyperinflation again seen, consistent with COPD.  IMPRESSION: Stable appearance of small right pleural effusion and associated right basilar atelectasis versus scarring.  COPD.   Electronically Signed   By: Earle Gell M.D.   On: 09/13/2014 11:01    Events: 6/21 Admit 6/22 Rt thoracentesis 6/23 Pig tail catheter placed by DF 6/24 TCTS consulted >> poor candidate for surgery; tPA into chest tube with 1300 ml fluid out 6/25 tPA into chest tube >> not much fluid out 6/26 tPA into chest tube >> not much fluid out 6/28 IR placed catheter in Rt pleural space 7/01 Chest tube  removed  Studies: 6/22 Rt Pleural fluid >> 1 liter, LDH 341, protein 3.7, WBC 10,299 (93% N) 6/30 Rt Pleural fluid >> LDH 3452, WBC 1930 (97% N) 7/05 CT chest >> mod Rt pleural effusion with loculation and pleural air, consolidation Rt base  Discussion: 81 yo male with COPD, A fib, and diastolic heart failure. Admitted with RLL HCAP and R effusion. R chest tube has been removed and he appears to be left with a complicated R pleural space, areas of loculation. No significant change in size or appearance on CXR from 7/10.   7/7 in and out of afib. Concerned that repeated invasive interventions will lead to complications.  Assessment/Plan:  COPD exacerbation. Plan: - cont  pulmicort, brovana, and prn xopenex - taper prednisone over the next week to 0, orders entered  HCAP with Rt parapneumonic pleural effusion, now complicated pleural space Plan: - Not candidate for VATS, I do not believe he would survive - Off Abx since 7/03 - no indication currently (either therapeutic or palliative) for chest tube - may be an indication for pleurex tube for sx management at some point in the future if R pleural effusion increases.  - Long conversation with the patient and his family today regarding the impact of the R effusion / pleural  rind, the reasons to avoid invasive procedures, the risks and his likely demise if he developed R empyema. I agree with Palliative Care assessment and assistance.   Acute hypoxic respiratory failure. Plan: - continue oxygen to keep SpO2 90 to 95% - push cardiopulm conditioning and rehab efforts.   A fib, acute on chronic diastolic CHF, CAD s/p stent.  Hx of CLL.  Please call if we can help in any way.   Johnathan Apo, MD, PhD 09/14/2014, 11:16 AM [facility] [phone] or if no answer [phone]

## 2014-09-14 NOTE — Care Management (Signed)
Important Message  Patient Details  Name: Johnathan Strong MRN: [id number] Date of Birth: 05/28/1923   Medicare Important Message Given:  Yes-second notification given    Nathen May 09/14/2014, 2:55 PM

## 2014-09-14 NOTE — Consult Note (Signed)
ASked to see Johnathan Strong by Dr. Verlon Au for palliative consult for goals of care in setting of complex hospitalization with poor chance of recovery.  Impression: 1. COPD Gold III now with chronic right pleural effusion 2. A fib with intermittent RVR stable on oral meds 3. CLL  Recommendations: 1. Continue current medical therapy 2. Home with Hospice with no return to hospital  HPI [name] suffers with Gold III COPD. He was hospitalized June 5 - August 19, 2014 for exacerbation of COPD with CAP. He was treated with IV steroids, antibx and nebs. Course was complicated by A. Fib, with RVR.  Johnathan Strong is a 81 y.o. male with past history of COPD, paroxysmal A. fib on Eliquis, CAD, hypertension was recently discharged from [facility] 6/15 after treatment for community-acquired pneumonia. He completed his course of Ceftin at home and subsequently did well for 10 days. On the day PTA his wife noticed that he was sleeping a lot more than usual, felt very weak. At night he had some chills and felt feverish. On the morning of admission he had increased dyspnea and pain in his right shoulder and across the right side of his chest which was pleuritic.  He presented to the ER where he was noted to have a white count of 24K, chest x-ray with worsening pneumonia and moderate right pleural effusion. Had blood cultures drawn, started on IV vancomycin and ceftazidime. This was the 4 th admission in 6 months.   During this admission he developed a pleural effusion exudative and blood in nature leading to 32 Fr chest tube. Receving IV antibx for PNA. With poor CT drainage TPA infused which lead to 1300 cc drainage and marked improvement in aeration 08/28/14. He had repeat TPA infiltration of pleural space. CT scan followed revealed improvement in right pleural effusion and no indication for VATS. He did come to insertion of pigtail cath with CT guidance 08/31/14. Pigtail cath removed 09/04/14 w/o  incident. By 7/4 patient with recurrent SOB and imaging evidence of recurrent pleural effusion right and PNA. He came off antibx 7/3. Pulmicort, Brovana and prn xopenex started 7/6. Solumedrol started 7/6. Patient with continued DOE and occasionally at rest, continued mild rales on exam. He has been transitioned to oral steroids 7/8. No further invasive procedures, e.g. Chest tubes, recommended by CCM.   He also developed recurrent a. Fib with RVR requiring diltiazem drip and the oral dosing. Beta blockers also being given. He did have difficult to control afib but has been continued on oral meds: CCB, BB, dig. He did develop acute on chronic HF which responded to diuretics. 7/7 restarted on NOAC, using dose loading for DVT. CHF stable as of 7/8.  DVT RLE by doppler, CT angio neg for PE. Pt started on Heparin.        Scheduled Meds:  apixaban  10 mg Oral BID   Followed by   Derrill Memo ON 09/18/2014] apixaban  5 mg Oral BID   arformoterol  15 mcg Nebulization BID   atorvastatin  10 mg Oral q1800   budesonide (PULMICORT) nebulizer solution  0.5 mg Nebulization BID   calcium carbonate  1 tablet Oral BID   diltiazem  360 mg Oral Daily   feeding supplement (ENSURE ENLIVE)  237 mL Oral TID BM   furosemide  40 mg Oral Daily   guaiFENesin  1,200 mg Oral BID   metoprolol tartrate  50 mg Oral BID   montelukast  10 mg Oral QHS  nystatin  5 mL Mouth/Throat QID   polyethylene glycol  17 g Oral BID   predniSONE  60 mg Oral Q breakfast   senna-docusate  1 tablet Oral BID   senna-docusate  1 tablet Oral QHS   sodium chloride  10 mL Intravenous Q12H   Continuous Infusions:  PRN Meds:.sodium chloride, acetaminophen **OR** [DISCONTINUED] acetaminophen, diphenhydrAMINE **AND** [DISCONTINUED] acetaminophen, fentaNYL (SUBLIMAZE) injection, gi cocktail, lactulose, levalbuterol, metoprolol, morphine CONCENTRATE, nitroGLYCERIN, ondansetron **OR** ondansetron (ZOFRAN) IV, oxyCODONE-acetaminophen,  sodium chloride, traMADol  Past Medical History  Diagnosis Date   Osteopenia 2006   Hyperlipidemia    BPH (benign prostatic hypertrophy)    Transient global amnesia    Osteoarthritis    History of TIA (transient ischemic attack) 1980'S    NO RESIDUAL   COPD (chronic obstructive pulmonary disease) with emphysema    Chronic back pain    Itching SECONDARY TO CLL-- CONTROLLED W/ SINGULAIR   Frequency    Nocturia    Hypertension CARDIOLOGIST- DR BRACKBILL-- LAST VISIT 12-15-2010 NOTE IN EPIC   CLL (chronic lymphocytic leukemia) LAST PLT COUNT APRL 2012  168    Managed at [facility]   Bladder cancer    Aspirin allergy     Eyes swell   Asthma    Dyspnea on exertion    CAD (coronary artery disease)     a. 08/2012 Cath: LM nl, LAD 90p, LCX 20-30, RCA 100ost fills via L->R collats, EF 55-65%;  b. 09/2012 PCI of prox LAD with 3.0x16 Promus DES.    Past Surgical History  Procedure Laterality Date   Excisional hemorrhoidectomy  1980   Cardiovascular stress test  07/2005    a. 07/2005- no reversible ischemia, normal EF b. 06/2009- EF 73%, no reversible ischemia, inferolateral TWIs at rest, upright in recovery c. 08/2012- mediuim-sized partially reversible basal to mid inferior and inferoseptal perfusion defect c/w with prior infarction and peri-infarct ischemia; EF 68% and borderline ST/T changes on stress ECG    Transurethral resection of bladder tumor  10-19-2008    AND DILATION URETHRAL STRICTURE   Cataract extraction w/ intraocular lens  implant, bilateral Bilateral ~ 2007   Transthoracic echocardiogram  01-03-2011    LVEF 50-35%, grade 1 diastolic dysfunction, no WMAs or structural abnormalities   Cystoscopy with biopsy  10/30/2011    Procedure: CYSTOSCOPY WITH BIOPSY;  Surgeon: Claybon Jabs, MD;  Location: [facility];  Service: Urology;  Laterality: N/A;   Cardiac catheterization  08/27/2012   Coronary angioplasty with stent  placement  09/26/2012    "1" (09/26/2012)   Coronary stent placement  09/26/12   Left heart catheterization with coronary angiogram N/A 08/27/2012    Procedure: LEFT HEART CATHETERIZATION WITH CORONARY ANGIOGRAM;  Surgeon: Peter M Martinique, MD;  Location: [facility] CATH LAB;  Service: Cardiovascular;  Laterality: N/A;   Percutaneous coronary stent intervention (pci-s) N/A 09/26/2012    Procedure: PERCUTANEOUS CORONARY STENT INTERVENTION (PCI-S);  Surgeon: Peter M Martinique, MD;  Location[facility] CATH LAB;  Service: Cardiovascular;  Laterality: N/A;    History   Social History   Marital Status: Married    Spouse Name: Abdullah   Number of Children: 2   Years of Education: Copywriter, advertising History   retired Scientist, product/process development    Social History Main Topics   Smoking status: Former Smoker -- 1.00 packs/day for 25 years    Types: Cigarettes    Quit date: 03/06/1960   Smokeless tobacco: Former Systems developer  Quit date: 10/25/1978   Alcohol Use: 0.0 oz/week    0 Standard drinks or equivalent per week     Comment: 09/26/2012 "glass of wine or mixed drink once/month"   Drug Use: No   Sexual Activity: No   Other Topics Concern   None   Social History Narrative  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 He was married in 1950 (7yr) and lives in his own home with his wife. He has a very supportive family.  Palliative Care Status - noted discussion between patient and Dr. SVerlon Au7/10/16: "he tells me categorically does not want to be intubated or cardioverted I had a long discussion in the presence of his daughter DButch Pennywho is a healthcare power of attorney and she clearly understands the multiple issues at play." Code status - DNR/DNI."  He wishes to go home with hospice and does not want to have future hospitalizations.   Palliative Prophylaxis: sennekot and lactulose in place for constipation; tramadol for pain; benadryl for chronic itching; roxanol for breakthrough pain and for  dyspnea  Palliative Review of Systems: Mild anxiety with SOB responsive to roxanol; no pain; no bowel problems.   PE:  Filed Vitals:   09/14/14 0826  BP: 156/63  Pulse: 84  Temp: 98.1 F (36.7 C)  Resp: 18   General: elderly but well preserved white man in no acute distress HEENT - C&S clear, PERRLA Cor- IRIR, reasonable rate Pul - decreased breath sounds right, no wheezing. Minimally increased WOB with talking Abd- deferred Neuro - A&O x 3, very cogent, no focal finding   CBC Latest Ref Rng 09/12/2014 09/11/2014 09/10/2014  WBC 4.0 - 10.5 K/uL 14.6(H) 16.3(H) 11.4(H)  Hemoglobin 13.0 - 17.0 g/dL 7.8(L) 8.0(L) 8.1(L)  Hematocrit 39.0 - 52.0 % 24.5(L) 25.0(L) 25.1(L)  Platelets 150 - 400 K/uL 282 288 291    CMP Latest Ref Rng 09/12/2014 09/10/2014 09/09/2014  Glucose 65 - 99 mg/dL 142(H) 225(H) 98  BUN 6 - 20 mg/dL 38(H) 27(H) 26(H)  Creatinine 0.61 - 1.24 mg/dL 1.25(H) 1.34(H) 1.30(H)  Sodium 135 - 145 mmol/L 135 134(L) 139  Potassium 3.5 - 5.1 mmol/L 4.4 4.5 4.5  Chloride 101 - 111 mmol/L 94(L) 93(L) 98(L)  CO2 22 - 32 mmol/L 31 30 33(H)  Calcium 8.9 - 10.3 mg/dL 8.1(L) 8.3(L) 8.3(L)  Total Protein 6.5 - 8.1 g/dL - - -  Albumin 3.5 - 4.7 g/dL - - -  Total Bilirubin 0.3 - 1.2 mg/dL - - -  Alkaline Phos 38 - 126 U/L - - -  AST 15 - 41 U/L - - -  ALT 17 - 63 U/L - - -     Imaging All imaging reviewed.  Last CXR 09/13/14: IMPRESSION: Stable appearance of small right pleural effusion and associated right basilar atelectasis versus scarring.  COPD.  Met with family - wife, d-in-law, step son. Discussed diseases, trajectory, prognosis which is poor. Discussed patient's strong desire to be at home with full knowledge of the risks involved. Discussed concept of the good death and he is very clear that he wants to be at home or hospice house and not to return to acute care hospital unless absolutely necessary for comfort care.  Plan - will request case manager to assist with  hospice referral.   Will review and execute MOST form in AM.  Time in 10:31 Time out 1300  Greater than 50% of encounter spent on education and counseling.  Thanks for the consult. Will  follow up in AM Adella Hare, MD, [place] Team [phone]

## 2014-09-14 NOTE — Care Management Note (Addendum)
Case Management Note  Patient Details  Name: Johnathan Strong MRN: [id number] Date of Birth: December 14, 1923  Subjective/Objective:      Pt plan for d/c 09-15-14 home with Hospice Care.               Action/Plan: CM received consult for Home Hospice: Family did choose [facility]. CM did call referral to [place]. DME to be delivered to pt's home via [facility] Oxygen to be delivered to pt's hospital room. Per family pt will be transferred home via car. No further needs from CM at this time.    Expected Discharge Date:                  Expected Discharge Plan:  [place]  In-House Referral:     Discharge planning Services  CM Consult  Post Acute Care Choice:  Home Health Choice offered to:  Patient, Spouse, Adult Children  DME Arranged:  Walker rolling, Nebulizer/meds, Oxygen, Wheelchair manual, Hospital bed, Overbed table DME Agency:  [facility]  HH Arranged:  RN [facility] Agency:  [facility]  Status of Service:  Completed, signed off  Medicare Important Message Given:  Yes-third notification given Date Medicare IM Given:  08/28/14 Medicare IM give by:  Jacqlyn Krauss, RN,BSN  Date Additional Medicare IM Given:    Additional Medicare Important Message give by:     If discussed at [place] of Stay Meetings, dates discussed:    Additional Comments:  Bethena Roys, RN 09/14/2014, 2:26 PM

## 2014-09-14 NOTE — Progress Notes (Signed)
Occupational Therapy Treatment Patient Details Name: Johnathan Strong MRN: [id number] DOB: [birth year] Today's Date: 09/14/2014    History of present illness 81 y.o. male with past history of COPD, paroxysmal A. fib on Eliquis, CAD, hypertension was recently discharged from [facility] 6/9 after treatment for community-acquired pneumonia. He presented to the ER where he was noted to have a white count of 24K, chest x-ray with worsening pneumonia and moderate right pleural effusion. RLE DVT, in/out A fib   OT comments  Pt seems motivated. Pt performed ADLs in session. See vital section below for O2 sats. Planning to go home with hospice.   Follow Up Recommendations  Home health OT;Supervision/Assistance - 24 hour    Equipment Recommendations  3 in 1 bedside comode    Recommendations for Other Services      Precautions / Restrictions Precautions Precautions: Fall Precaution Comments: monitor O2 Restrictions Weight Bearing Restrictions: No       Mobility Bed Mobility               General bed mobility comments: not assessed  Transfers Overall transfer level: Needs assistance   Transfers: Sit to/from Stand Sit to Stand: Min guard         General transfer comment: Min guard for safety    Balance  No LOB in session. Min guard for ambulation.                                  ADL Overall ADL's : Needs assistance/impaired     Grooming: Wash/dry face;Brushing hair;Oral care;Wash/dry hands;Applying deodorant;Standing;Sitting;Set up;Supervision/safety   Upper Body Bathing: Set up;Supervision/ safety;Standing (washed armpits and also wet hair)   Lower Body Bathing: Min guard;Sit to/from stand   Upper Body Dressing : Set up;Supervision/safety;Sitting;Standing Upper Body Dressing Details (indicate cue type and reason): OT assisted with tying/untying gown and managing lines Lower Body Dressing: Set up;Supervision/safety;Sitting/lateral leans (changed  socks)   Toilet Transfer: Min guard;Ambulation; regular height toilet (stood to urinate)    Toileting- Water quality scientist and Hygiene: Min guard (standing)       Functional mobility during ADLs: Min guard General ADL Comments: Educated on energy conservation and deep breathing technique. Daughter asking OT about pt getting up/moving and OT discussed with her.       Vision                     Perception     Praxis      Cognition  Awake/Alert Behavior During Therapy: WFL for tasks assessed/performed Overall Cognitive Status: Within Functional Limits for tasks assessed                       Extremity/Trunk Assessment               Exercises     Shoulder Instructions       General Comments      Pertinent Vitals/ Pain       Pain Assessment: No/denies pain   Pt on 2.5L of O2 towards beginning of session and O2 at 90% when pt was at sink. Bumped pt up to 3L of O2 for remaining ADLs and pulse oximeter having difficult time reading but did see it reading in 90s (may have dropped to 80s?). Pt's O2 in 90s at end of session on 3L, so OT bumped O2 back down to 2.5L.   Home Living  Prior Functioning/Environment              Frequency Min 2X/week     Progress Toward Goals  OT Goals(current goals can now be found in the care plan section)  Progress towards OT goals: Progressing toward goals (updated goals-due today/downgraded some)  Acute Rehab OT Goals Patient Stated Goal: go home OT Goal Formulation: With patient Time For Goal Achievement: 09/21/14 Potential to Achieve Goals: Good ADL Goals Pt Will Perform Upper Body Bathing: with set-up;standing Pt Will Perform Lower Body Bathing: with set-up;sit to/from stand Pt Will Perform Lower Body Dressing: with set-up;sit to/from stand Pt Will Transfer to Toilet: with supervision;ambulating Pt Will Perform Toileting - Clothing Manipulation  and hygiene: with modified independence;sit to/from stand Additional ADL Goal #1: Pt will independently verbalize 3/3 energy conservation techniques and utilize during session.  Plan Discharge plan remains appropriate    Co-evaluation                 End of Session Equipment Utilized During Treatment: Gait belt;Oxygen   Activity Tolerance Patient tolerated treatment well   Patient Left in chair;with call bell/phone within reach;with family/visitor present;Other (comment) (MD present)   Nurse Communication Mobility status (bumped pt's O2 back down; bumped up O2 in session)        Time: 1525-1600 OT Time Calculation (min): 35 min  Charges: OT General Charges $OT Visit: 1 Procedure OT Treatments $Self Care/Home Management : 23-37 mins  Benito Mccreedy OTR/L [phone] 09/14/2014, 4:15 PM

## 2014-09-14 NOTE — Progress Notes (Signed)
Patient Name: Johnathan Strong Date of Encounter: 09/14/2014  Principal Problem:   HCAP (healthcare-associated pneumonia) Active Problems:   CAD (coronary artery disease)   Atrial fibrillation   Chronic lymphatic leukemia   Pleural effusion on right   Sepsis   Pleural effusion   Pleural effusion, right   Blood poisoning   Chest tube in place   Pleural effusion associated with pulmonary infection   SOB (shortness of breath)   Essential hypertension   Respiratory failure   Dyspnea  SUBJECTIVE  Denies chest pain or palpitation. SOB is table. Feel better.   CURRENT MEDS . apixaban  10 mg Oral BID   Followed by  . [START ON 09/18/2014] apixaban  5 mg Oral BID  . arformoterol  15 mcg Nebulization BID  . atorvastatin  10 mg Oral q1800  . budesonide (PULMICORT) nebulizer solution  0.5 mg Nebulization BID  . calcium carbonate  1 tablet Oral BID  . diltiazem  360 mg Oral Daily  . feeding supplement (ENSURE ENLIVE)  237 mL Oral TID BM  . guaiFENesin  1,200 mg Oral BID  . metoprolol tartrate  50 mg Oral BID  . montelukast  10 mg Oral QHS  . nystatin  5 mL Mouth/Throat QID  . polyethylene glycol  17 g Oral BID  . predniSONE  60 mg Oral Q breakfast  . senna-docusate  1 tablet Oral BID  . senna-docusate  1 tablet Oral QHS  . sodium chloride  10 mL Intravenous Q12H    OBJECTIVE  Filed Vitals:   09/14/14 0100 09/14/14 0522 09/14/14 0628 09/14/14 0826  BP: 159/68 150/74  156/63  Pulse: 75   84  Temp: 98.1 F (36.7 C) 97.9 F (36.6 C)  98.1 F (36.7 C)  TempSrc:  Oral  Oral  Resp: 20 20  18   Height:      Weight:  155 lb 4.8 oz (70.444 kg)    SpO2: 98% 98% 99% 96%    Intake/Output Summary (Last 24 hours) at 09/14/14 0844 Last data filed at 09/14/14 0828  Gross per 24 hour  Intake      0 ml  Output   1725 ml  Net  -1725 ml   Filed Weights   09/12/14 0400 09/13/14 0400 09/14/14 0522  Weight: 154 lb 9.6 oz (70.126 kg) 154 lb 11.2 oz (70.171 kg) 155 lb 4.8 oz (70.444  kg)    PHYSICAL EXAM  General: Pleasant, NAD. Nasal cannula in place.  Neuro: Alert and oriented X 3. Moves all extremities spontaneously. Psych: Normal affect. HEENT:  Normal  Neck: Supple without bruits or JVD. Lungs:  Resp regular and unlabored. Diminished breath sound right base. Expiratory wheeze throughout.   Mild rhonchi.   Heart: irregular. no s3, s4, or murmurs. Abdomen: Soft, non-tender, non-distended, BS + x 4.  Extremities: No clubbing, cyanosis or edema. DP/PT/Radials 2+ and equal bilaterally.  Accessory Clinical Findings  CBC  Recent Labs  09/12/14 0329  WBC 14.6*  NEUTROABS 8.8*  HGB 7.8*  HCT 24.5*  MCV 89.1  PLT 213   Basic Metabolic Panel  Recent Labs  09/12/14 0329  NA 135  K 4.4  CL 94*  CO2 31  GLUCOSE 142*  BUN 38*  CREATININE 1.25*  CALCIUM 8.1*    TELE  afib at rate of 70-90s. Occasional spike to 120s. Transient aflutter.   Radiology/Studies   ASSESSMENT AND PLAN   1. PAF with RVR - On Eliquis. Rate is reasonably controlled. Likely  due to respiratory illness.  - Continue Cardizem CD 360mg  and lopressor 50mg  BID  2. Acute on chronic diastolic CHF - Appears euvolemic - Continue lasix 40mg  today  3. Left DVT on 7/6 - Negative chest CT for PE - IV heparin started ---> BID Elliquis-Elliquis 10 mg bid till 7/15, then 5mg  bid subsequently  4. Acute hypoxic repiratory failure - Roxanol helped him to calm down, breathing better   5. CAD s/p DES to LAD in 2014 - No chest pain - Continue BB and statin - Plan to restart plavix once Elliquis dosing is 5 mg bid [on 7/15]  6. HTN - relative stable  7. Acute kidney injury - Likely cardio renal syndrome - creatinine improved to 1.25 from 1.34 Signed, Bhagat,Bhavinkumar PA-C Pager [phone]  Patient seen and examined  I have amended note to reflect my findings.   No new recommendations.  Volume status is not bad   Need to watch for signs of blood loss  Plan to resume plavix   But may need to reassess given recent hgb decline.    Dorris Carnes

## 2014-09-14 NOTE — Progress Notes (Signed)
[facility] PROGRESS NOTE  Johnathan Strong MEQ:[id number] DOB: Feb 10, 1924 DOA: 08/25/2014 PCP: Redge Gainer, MD  doing fair.multiple physicians have seen him today. He is a little tired. He's had a lot of visitors and family members Feels a little swollen in his lower extremities but not really short of breath right now Had a good meal today and feels that prednisone is helping his appetite No chest pain, no fever no chills Worked with therapy well today   Interim Summary  55 CML, d/c 08/19/2014, admitted on 08/09/2014 during which he was treated for community-acquired pneumonia. Patient showed gradual improvement as he was weaned off of supplemental oxygen, tolerating by mouth intake, and was discharged on Ceftin.  did well the week after discharge, got back to his normal routine, then had a decline CXR performed on admission revealed moderate right pleural effusion with associated right lung base consolidation/collapse.   He was started on broad-spectrum IV antibiotic therapy with vancomycin and ceftazidime, undergoing ultrasound-guided thoracentesis by interventional radiology on 08/26/2014.  Pulmonary critical care medicine was consulted regarding pleural effusion as it was felt he would benefit chest tube.  PCCM placed a 32 French chest tube on 08/27/2014 (rather than wayne cath) since there was marked fibrinous tissue and concern for loculation. Subsequent imagining showed incomplete drainage for which TPA was injected into pleural space. This seemed to help initially however subsequent injections did not yield significant fluid.  PCCM recommended proceeding with IR consult for PIG-tail catheter, which was performed on 09/01/2014. Complicated by A. fib with RVR on 08/27/2014 likely precipitated by underlying pulmonary infectious process and acute hypoxemic respiratory failure.  Consulted cardiology on 09/01/2014.[name]  Patient had issues with A. fib with RVR and being hypotensive Chest  tubes were subsequently removed.   Patient with some bouts of shortness of breath felt to be secondary to possible volume overload.    Assessment/Plan: #1 healthcare associated pneumonia with probable parapneumonic effusion D/c 08/19/2014 s/p Rx CAP who presented with a probable parapneumonic effusion. underwent ultrasound-guided thoracentesis by interventional radiology with a 1L of yellow colored fluid removed with a cell count of 10,299. = exudative characteristics.  No organisms seen on Gram stain. Blood cultures-.  chest tube placed on 08/27/2014 with 1200 mL output in all 625 and 626 minimal output.   On 09/01/2014 posterior 12 French chest tube was placed per IR.  09/02/2014 patient had right lateral chest tube removed per critical care.  Treatment options  discussed -  drain catheter vs repeat thoracentesis. Pulmonary medicine gave TPA injections into pleural space due to amount of fibrous material and incomplete fluid drainage from the chest tube.  Patient did have a repeat chest CT that showed interval improvement in right-sided pleural effusion.  Patient was evaluated by Dr. Roxan Hockey of cardiothoracic surgery who did not recommend a VATS procedure.  Patient had pigtail catheter placement done on 09/01/2014 per interventional radiology. Second chest tube was removed.  Repeat chest x-ray done 09/08/14 with no interval change in the chest.  Persistent right basilar collapse/consolidation and effusion. Clinical improvement.  Pulmonology has weighed in on 09/10/14-no further acute interventions for Chest tube plaements -continue Brovana/pulmicort and Xopenex -transition off IV steroids  7/9 re-ssess on PO prednisone long taper -recurrent episodic decompensation culminated in Palliative care consult -palliative medicine has deleted goals of care and the patient will go home with hospice and comfort approach. I have discontinued patient's telemetry and we will liberalize diet  RLE  DVT -found on Doppler LE's on 7/6 -  IV heparin started ---> BID Elliquis-Elliquis 10 mg bid till 7/15, then 5mg  bid subsequently -CT angio non suggestive PE  #2 acute hypoxemic respiratory failure  -Likely multifactorial secondary to problem #1. Improving slowly.  Patient with some shortness of breath on ambulation to the bathroom without his oxygen. . Was on IV and transitioned to oral Lasix 20 mg daily tomorrow.  Patient is status post 2 weeks of antibiotics.  desat screen reveals needs Oxygen on d/c  #3 A. fib with RVR on Elliquis CHADS2VASC score 6. Patient was noted to have heart rates in the 140s earlier in admit Patient has flip-flopped between afib and NSR 2/2 patient's pulmonary process vs use of albuterol Patient has been seen in consultation by cardiology were adjusting patient's rate controlling medications.  Patient blood pressure with some improvement.  Metoprolol dose has been increased to 50 mg twice daily per cardiology 7/1 Diltiazem had been decreased to 240 mg daily-->2/2 to RVR ? 360 on 09/10/14 Digoxin 0.125 d/c 7/6 Per cardiology.  #4 coronary artery disease status post DES to the LAD in 2014 Stable. Continue beta blocker, statin.  Plavix to re-start once Elliquis dosing is 5 mg bid [on 7/15]  #5 acute on chronic diastolic heart failure Patient noted to have intermittent shortness of breath -Seems Euvolemic -Lasix currently at 20 daily  #6 sepsis Noted on admission as patient had a white count of 24.2, respiratory rate of 33 heart rate of 107 with source of infection being healthcare associated pneumonia. Blood cultures NEG Pleural fluid cultures NEG.  Patient status post 2 weeks of empiric antibiotics.    #7 hypertension See above meds    #8 acute kidney injury c Cardio-renal syndomre -Needs Diuresis -he has had recurrent episodic SOB -Diuresis seems to have helped his latest episde but this might be at the expense of his kidneys -Pallaitive care to  delineate goals   #9 leukocytosis now 2/2 to Steroid use WBC24->17>14 -->11.4-->16.3-->14.6 Patient is afebrile. Patient denies any diarrhea. Patient denies any dysuria.  Repeat chest x-ray 7/8= small complex effusion unchanged Patient status post approximately 2 weeks of antibiotics which have been discontinued.  No further antibiotics needed at this time.   #10 CLL Outpatient follow-up. Likely contributes to leukocytosis  #11 prophylaxis SCDs for DVT prophylaxis.  Code Status: Full Family Communication: Updated patient, wife and daughter at bedside. Disposition Plan: Home with home health when medically stable.  Consultants:  Cardiology: Dr. Haroldine Laws 09/01/2014  CT surgery Dr. Roxan Hockey 08/28/2014  Critical care medicine Dr. Alva Garnet 08/26/2014  Procedures:  Ultrasound-guided thoracentesis 08/26/2014  CT guided drainage catheter placement into caudal aspect of right pleural space per Dr. Pascal Lux 09/01/2014  Right chest tube placement 08/27/2014  CT chest 08/30/2014, 08/25/2014  Right Chest tube removal per interventional radiology 09/04/2014  Significant tests/ events 6/23 A. fib RVR- transferred to stepdown unit 6/23 [place] of right chest - marked fibrinous tissue and concern for loculation t/o the right chest wall Rt chest tube 6/23 >>TPA 6/24: 1200 ml output, on 6/25 and 6/26 minimal output.  CT chest 6/26: Interval improvement in a moderate size right pleural fluid collection with associated compressive atelectasis over the right middle and lower lobes. Right-sided chest tube in with tip over the posterior pleural space in the most superior aspect of the pleural fluid collection. 6/28: posterior 12 fr chest tube placed in IR  6/29: right lateral 32 fr CT removed.  7/1 posterior chest tube removed  Antibiotics:  IV Fortaz 08/25/2014>>>>>>09/03/14  IV vancomycin  08/25/2014>>>> 09/01/2014  Oral levaquin 09/03/14>>>>> 09/06/2014   Objective: Filed Vitals:    09/14/14 1600  BP: 137/49  Pulse: 77  Temp: 97.7 F (36.5 C)  Resp: 20    Intake/Output Summary (Last 24 hours) at 09/14/14 1702 Last data filed at 09/14/14 1300  Gross per 24 hour  Intake    240 ml  Output    675 ml  Net   -435 ml   Filed Weights   09/12/14 0400 09/13/14 0400 09/14/14 0522  Weight: 70.126 kg (154 lb 9.6 oz) 70.171 kg (154 lb 11.2 oz) 70.444 kg (155 lb 4.8 oz)    Exam:   General:  NAD  Cardiovascular: RRR  Respiratory: Decreased breath sounds in the right base, otherwise clear.  Abdomen: Soft, nontender, nondistended, positive bowel sounds.  Musculoskeletal: No clubbing cyanosis or edema.  Data Reviewed: Basic Metabolic Panel:  Recent Labs Lab 09/08/14 0448 09/09/14 0506 09/10/14 0036 09/12/14 0329  NA 137 139 134* 135  K 4.5 4.5 4.5 4.4  CL 101 98* 93* 94*  CO2 25 33* 30 31  GLUCOSE 92 98 225* 142*  BUN 34* 26* 27* 38*  CREATININE 1.19 1.30* 1.34* 1.25*  CALCIUM 8.0* 8.3* 8.3* 8.1*  PHOS  --   --  3.1  --    Liver Function Tests:  Recent Labs Lab 09/10/14 0036  ALBUMIN 2.3*   No results for input(s): LIPASE, AMYLASE in the last 168 hours. No results for input(s): AMMONIA in the last 168 hours. CBC:  Recent Labs Lab 09/08/14 0448 09/09/14 0506 09/10/14 0036 09/11/14 0351 09/12/14 0329 09/14/14 1124  WBC 17.5* 14.0* 11.4* 16.3* 14.6* 25.9*  NEUTROABS 10.7*  --  6.2  --  8.8* PENDING  HGB 8.2* 8.2* 8.1* 8.0* 7.8* 10.0*  HCT 25.3* 25.8* 25.1* 25.0* 24.5* 31.2*  MCV 90.7 89.9 90.3 89.9 89.1 89.4  PLT 340 317 291 288 282 302   Cardiac Enzymes: No results for input(s): CKTOTAL, CKMB, CKMBINDEX, TROPONINI in the last 168 hours. BNP (last 3 results)  Recent Labs  04/21/14 1541 08/25/14 0700  BNP 484.0* 224.5*    ProBNP (last 3 results) No results for input(s): PROBNP in the last 8760 hours.  CBG: No results for input(s): GLUCAP in the last 168 hours.  No results found for this or any previous visit (from the  past 240 hour(s)).   Studies: Dg Chest 2 View  09/13/2014   CLINICAL DATA:  Shortness of breath. Coronary artery disease. COPD.  EXAM: CHEST  2 VIEW  COMPARISON:  09/11/2014  FINDINGS: Small right pleural effusion again seen with associated atelectasis or scarring in the right lung base. These findings are stable since previous study.  No new or worsening areas of pulmonary opacity seen. Left lung remains clear. Heart size is within normal limits. Pulmonary hyperinflation again seen, consistent with COPD.  IMPRESSION: Stable appearance of small right pleural effusion and associated right basilar atelectasis versus scarring.  COPD.   Electronically Signed   By: Earle Gell M.D.   On: 09/13/2014 11:01    Scheduled Meds: . apixaban  10 mg Oral BID   Followed by  . [START ON 09/18/2014] apixaban  5 mg Oral BID  . arformoterol  15 mcg Nebulization BID  . budesonide (PULMICORT) nebulizer solution  0.5 mg Nebulization BID  . calcium carbonate  1 tablet Oral BID  . diltiazem  360 mg Oral Daily  . feeding supplement (ENSURE ENLIVE)  237 mL Oral TID BM  .  guaiFENesin  1,200 mg Oral BID  . metoprolol tartrate  50 mg Oral BID  . montelukast  10 mg Oral QHS  . nystatin  5 mL Mouth/Throat QID  . polyethylene glycol  17 g Oral BID  . [START ON 09/19/2014] predniSONE  10 mg Oral Q breakfast  . [START ON 09/17/2014] predniSONE  20 mg Oral Q breakfast  . [START ON 09/15/2014] predniSONE  30 mg Oral Q breakfast  . senna-docusate  1 tablet Oral BID  . senna-docusate  1 tablet Oral QHS  . sodium chloride  10 mL Intravenous Q12H   Continuous Infusions:    Principal Problem:   HCAP (healthcare-associated pneumonia) Active Problems:   CAD (coronary artery disease)   Atrial fibrillation   Chronic lymphatic leukemia   Pleural effusion on right   Sepsis   Pleural effusion   Pleural effusion, right   Blood poisoning   Chest tube in place   Pleural effusion associated with pulmonary infection   SOB  (shortness of breath)   Essential hypertension   Respiratory failure   Dyspnea   Acute respiratory failure   Empyema   Palliative care by specialist    Time spent: 25 minutes Verneita Griffes, MD [facility] (P) [phone]

## 2014-09-15 DIAGNOSIS — R0600 Dyspnea, unspecified: Secondary | ICD-10-CM

## 2014-09-15 LAB — PATHOLOGIST SMEAR REVIEW

## 2014-09-15 MED ORDER — TRAMADOL HCL 50 MG PO TABS: ORAL_TABLET | ORAL | Status: DC

## 2014-09-15 MED ORDER — CLOPIDOGREL BISULFATE 75 MG PO TABS: 75.0000 mg | ORAL_TABLET | Freq: Every day | ORAL | Status: DC

## 2014-09-15 MED ORDER — MORPHINE SULFATE (CONCENTRATE) 10 MG/0.5ML PO SOLN: 5.0000 mg | ORAL | Status: AC | PRN

## 2014-09-15 MED ORDER — PREDNISONE 20 MG PO TABS: 40.0000 mg | ORAL_TABLET | Freq: Every day | ORAL | Status: DC

## 2014-09-15 MED ORDER — BUDESONIDE 0.5 MG/2ML IN SUSP: 0.5000 mg | Freq: Two times a day (BID) | RESPIRATORY_TRACT | Status: AC

## 2014-09-15 MED ORDER — DILTIAZEM HCL ER COATED BEADS 360 MG PO CP24: 360.0000 mg | ORAL_CAPSULE | Freq: Every day | ORAL | Status: DC

## 2014-09-15 MED ORDER — APIXABAN 5 MG PO TABS: 5.0000 mg | ORAL_TABLET | Freq: Two times a day (BID) | ORAL | Status: DC

## 2014-09-15 MED ORDER — LACTULOSE 10 GM/15ML PO SOLN: 20.0000 g | Freq: Every day | ORAL | Status: DC | PRN

## 2014-09-15 MED ORDER — METOPROLOL TARTRATE 50 MG PO TABS: 50.0000 mg | ORAL_TABLET | Freq: Two times a day (BID) | ORAL | Status: DC

## 2014-09-15 MED ORDER — FUROSEMIDE 20 MG PO TABS: 20.0000 mg | ORAL_TABLET | Freq: Three times a day (TID) | ORAL | Status: DC | PRN

## 2014-09-15 MED ORDER — NYSTATIN 100000 UNIT/ML MT SUSP: 5.0000 mL | Freq: Four times a day (QID) | OROMUCOSAL | Status: DC

## 2014-09-15 MED ORDER — GUAIFENESIN ER 600 MG PO TB12: 1200.0000 mg | ORAL_TABLET | Freq: Two times a day (BID) | ORAL | Status: DC

## 2014-09-15 MED ORDER — APIXABAN 5 MG PO TABS: 10.0000 mg | ORAL_TABLET | Freq: Two times a day (BID) | ORAL | Status: DC

## 2014-09-15 MED ORDER — ARFORMOTEROL TARTRATE 15 MCG/2ML IN NEBU: 15.0000 ug | INHALATION_SOLUTION | Freq: Two times a day (BID) | RESPIRATORY_TRACT | Status: DC

## 2014-09-15 MED ORDER — LEVALBUTEROL HCL 0.63 MG/3ML IN NEBU: 0.6300 mg | INHALATION_SOLUTION | RESPIRATORY_TRACT | Status: DC | PRN

## 2014-09-15 NOTE — Progress Notes (Signed)
[place] for Heparin  Indication: DVT  Allergies  Allergen Reactions  . Aspirin Anaphylaxis, Shortness Of Breath and Swelling  . Nsaids Anaphylaxis, Shortness Of Breath and Swelling    Raise BP  . Septra [Sulfamethoxazole-Trimethoprim] Rash  . Albuterol     Can not tolerate high/ frequent doses due to [place] with using HFA PRN  . Benicar [Olmesartan Medoxomil]     dizzy  . Salicylates     SOB  . Ampicillin Rash   Patient Measurements: Height: 5\' 8"  (172.7 cm) Weight: 155 lb 4.8 oz (70.444 kg) IBW/kg (Calculated) : 68.4   Vital Signs: Temp: 98.1 F (36.7 C) (07/12 0643) Temp Source: Oral (07/12 0643) BP: 161/68 mmHg (07/12 0643) Pulse Rate: 76 (07/12 0643)  Labs:  Recent Labs  09/14/14 1124  HGB 10.0*  HCT 31.2*  PLT 302    Estimated Creatinine Clearance: 38.8 mL/min (by C-G formula based on Cr of 1.25).  Assessment: 81 yo male noted with new DVT (09/09/14) and also with history of afib on apixiban PTA.  CBC stable, bump in WBCto 25.9, SCr 1.25, eCrCl 30-35 ml/min.    Goal of Therapy:  Heparin level 0.3-0.7 units/ml Monitor platelets by anticoagulation protocol: Yes   Plan:  -Eliquis 10 mg po bid to end on 7/14, transition to 5 mg po bid on the morning of 7/15 -CBC q72h -Monitor s/sx bleeding -Resume Plavix on 7/15 when lower dose Eliquis begins     Hughes Better, PharmD, BCPS Clinical Pharmacist Pager: [phone] 09/15/2014 10:25 AM

## 2014-09-15 NOTE — Progress Notes (Signed)
Daily Progress Note   Patient Name: Johnathan Strong       Date: 09/15/2014 DOB: 06-01-1923  Age: 81 y.o. MRN#: [id number] Attending Physician: Nita Sells, MD Primary Care Physician: Redge Gainer, MD Admit Date: 08/25/2014  Reason for Consultation/Follow-up: Disposition, Establishing goals of care and Psychosocial/spiritual support  Subjective: Feels OK. No new c/o. No respiratory distress Interval Events: No events noted in record since yesterday's consult  Length of Stay: 21 days  Current Medications: Scheduled Meds:   apixaban  10 mg Oral BID   Followed by   Derrill Memo ON 09/18/2014] apixaban  5 mg Oral BID   arformoterol  15 mcg Nebulization BID   budesonide (PULMICORT) nebulizer solution  0.5 mg Nebulization BID   diltiazem  360 mg Oral Daily   feeding supplement (ENSURE ENLIVE)  237 mL Oral TID BM   guaiFENesin  1,200 mg Oral BID   metoprolol tartrate  50 mg Oral BID   montelukast  10 mg Oral QHS   nystatin  5 mL Mouth/Throat QID   [START ON 09/19/2014] predniSONE  10 mg Oral Q breakfast   [START ON 09/17/2014] predniSONE  20 mg Oral Q breakfast   predniSONE  30 mg Oral Q breakfast   senna-docusate  1 tablet Oral BID   senna-docusate  1 tablet Oral QHS   sodium chloride  10 mL Intravenous Q12H    Continuous Infusions:    PRN Meds: sodium chloride, acetaminophen **OR** [DISCONTINUED] acetaminophen, fentaNYL (SUBLIMAZE) injection, gi cocktail, lactulose, levalbuterol, metoprolol, morphine CONCENTRATE, nitroGLYCERIN, ondansetron **OR** ondansetron (ZOFRAN) IV, sodium chloride, traMADol  Palliative Performance Scale: 40%     Vital Signs: BP 161/68 mmHg   Pulse 76   Temp(Src) 98.1 F (36.7 C) (Oral)   Resp 20   Ht 5\' 8"  (1.727 m)   Wt 70.444 kg (155 lb 4.8 oz)   BMI 23.62 kg/m2   SpO2 98% SpO2: SpO2: 98 % O2 Device: O2 Device: Nasal Cannula O2 Flow Rate: O2 Flow Rate (L/min): 2 L/min  Intake/output summary:  Intake/Output Summary (Last 24  hours) at 09/15/14 0940 Last data filed at 09/14/14 1800  Gross per 24 hour  Intake    600 ml  Output    350 ml  Net    250 ml   LBM:   Baseline Weight: Weight: 74.844 kg (165 lb) Most recent weight: Weight: 70.444 kg (155 lb 4.8 oz)  Physical Exam: Gen'l- elderly white man in no distress Pul - no increased of work of breathing, no wheezing Neuro - awake and alert              Additional Data Reviewed: Recent Labs     09/14/14  1124  WBC  25.9*  HGB  10.0*  PLT  302     Problem List:  Patient Active Problem List   Diagnosis Date Noted   Acute respiratory failure    Empyema    Palliative care by specialist    Dyspnea    Respiratory failure    Essential hypertension    SOB (shortness of breath)    Chest tube in place    Pleural effusion associated with pulmonary infection    Blood poisoning    Pleural effusion    Pleural effusion, right    Sepsis 08/26/2014   HCAP (healthcare-associated pneumonia) 08/25/2014   Pleural effusion on right 08/25/2014   CAP (community acquired pneumonia)    Pneumonia 08/09/2014   Paroxysmal atrial fibrillation 05/14/2014   Atrial fibrillation 04/26/2014  Atrial fibrillation with RVR 04/26/2014   Acute respiratory failure with hypoxia 04/21/2014   Bladder cancer 03/10/2014   Testosterone deficiency 06/24/2013   ASCVD (arteriosclerotic cardiovascular disease) 06/24/2013   Hyperlipemia 10/21/2012   Unstable angina 09/27/2012   CAD (coronary artery disease)    DOE (dyspnea on exertion) 08/02/2012   Dizziness 08/02/2012   Chronic lymphatic leukemia 07/04/2012   Hypertension 12/15/2010   Chronic lymphocytic leukemia 12/15/2010   COPD GOLD III 08/30/2010     Palliative Care Assessment & Plan    Code Status:  DNR  Goals of Care:  Home with hospice  Desire for further Chaplaincy support:no  3. Symptom Management:  No active problems - symptoms well control  4. Palliative  Prophylaxis:  Stool [place]: yes  5. Prognosis: < 6 months  5. Discharge Planning: Home with Hospice   Care plan was discussed with patient. Discussed and completed MOST (copy to be scanned to chart): DNR, comfort care only including thoracentesis for comfort if needed, no antibiotics in the event of serious infection, no artificial hydration or nutrition  Thank you for allowing the Palliative Medicine Team to assist in the care of this patient.   Time In: 0940 Time Out: 1020 Total Time 40 min Prolonged Time Billed  no     Greater than 50%  of this time was spent counseling and coordinating care related to the above assessment and plan.   Adella Hare, MD, [place] Team  09/15/2014, 9:40 AM  Please contact Palliative Medicine Team phone at [phone] for questions and concerns.

## 2014-09-16 NOTE — Discharge Summary (Signed)
Physician Discharge Summary  Johnathan Strong UUV:[id number] DOB: [birth year] DOA: 08/25/2014  PCP: Redge Gainer, MD  Admit date: 08/25/2014 Discharge date: 09/16/2014  Time spent: 60 minutes  Recommendations for Outpatient Follow-up:  1. Needs Hiospice at home 2. EOL issues to be addressed by PCP  3. Scripts given for Oxygen/opiates etc etc 4. ELiquis dosing change ALERT-see below med list 5. Plavix to re-start on 7/15 to coincide with above   Discharge Diagnoses:  Principal Problem:   HCAP (healthcare-associated pneumonia) Active Problems:   CAD (coronary artery disease)   Atrial fibrillation   Chronic lymphatic leukemia   Pleural effusion on right   Sepsis   Pleural effusion   Pleural effusion, right   Blood poisoning   Chest tube in place   Pleural effusion associated with pulmonary infection   SOB (shortness of breath)   Essential hypertension   Respiratory failure   Dyspnea   Acute respiratory failure   Empyema   Palliative care by specialist   Discharge Condition: gaurded  Diet recommendation:   Filed Weights   09/12/14 0400 09/13/14 0400 09/14/14 0522  Weight: 70.126 kg (154 lb 9.6 oz) 70.171 kg (154 lb 11.2 oz) 70.444 kg (155 lb 4.8 oz)    History of present illness:  81 CML, d/c 08/19/2014, admitted on 08/09/2014 during which he was treated for community-acquired pneumonia. Patient showed gradual improvement as he was weaned off of supplemental oxygen, tolerating by mouth intake, and was discharged on Ceftin. did well the week after discharge, got back to his normal routine, then had a decline CXR performed on admission revealed moderate right pleural effusion with associated right lung base consolidation/collapse.  He was started on broad-spectrum IV antibiotic therapy with vancomycin and ceftazidime, undergoing ultrasound-guided thoracentesis by interventional radiology on 08/26/2014.  Pulmonary critical care medicine was consulted regarding pleural  effusion as it was felt he would benefit chest tube.  PCCM placed a 32 French chest tube on 08/27/2014 (rather than wayne cath) since there was marked fibrinous tissue and concern for loculation. Subsequent imagining showed incomplete drainage for which TPA was injected into pleural space. This seemed to help initially however subsequent injections did not yield significant fluid. PCCM recommended proceeding with IR consult for PIG-tail catheter, which was performed on 09/01/2014. Complicated by A. fib with RVR on 08/27/2014 likely precipitated by underlying pulmonary infectious process and acute hypoxemic respiratory failure.  Consulted cardiology on 09/01/2014 who attempted to rate control him and diurese which proved difficult but we were able to get some reasonable control  Patient had issues with A. fib with RVR and being hypotensive Chest tubes were subsequently removed.   Patient with some bouts of shortness of breath felt to be secondary to possible volume overload. Palliative care ultimately was consutled to delineate goals of care with multiple non-curable illnesses and adult FTT in setting of new onset Afib, Cardio-renal syndrome as well as patient being a high risk patient for any type of invasive lung procedure moving forward [other than pelurx for comfort]  He was d/c home on 09/16/14 with Hospsice and I expect his prognosis is gaurded  Discharge Exam: Filed Vitals:   09/15/14 0643  BP: 161/68  Pulse: 76  Temp: 98.1 F (36.7 C)  Resp: 20    General: alert anxious but understands Cardiovascular: s1 s2 tachy Respiratory:  Decreased fremitus  And increased resonance R hemithorax  Discharge Instructions   Discharge Instructions    Diet - low sodium heart healthy    Complete  by:  As directed      Increase activity slowly    Complete by:  As directed           Discharge Medication List as of 09/15/2014 11:50 AM    START taking these medications   Details   arformoterol (BROVANA) 15 MCG/2ML NEBU Take 2 mLs (15 mcg total) by nebulization 2 (two) times daily., Starting 09/15/2014, Until Discontinued, Print    budesonide (PULMICORT) 0.5 MG/2ML nebulizer solution Take 2 mLs (0.5 mg total) by nebulization 2 (two) times daily., Starting 09/15/2014, Until Discontinued, Print    guaiFENesin (MUCINEX) 600 MG 12 hr tablet Take 2 tablets (1,200 mg total) by mouth 2 (two) times daily., Starting 09/15/2014, Until Discontinued, Normal    lactulose (CHRONULAC) 10 GM/15ML solution Take 30 mLs (20 g total) by mouth daily as needed for moderate constipation., Starting 09/15/2014, Until Discontinued, Print    levalbuterol (XOPENEX) 0.63 MG/3ML nebulizer solution Take 3 mLs (0.63 mg total) by nebulization every 2 (two) hours as needed for wheezing or shortness of breath., Starting 09/15/2014, Until Discontinued, Print    Morphine Sulfate (MORPHINE CONCENTRATE) 10 MG/0.5ML SOLN concentrated solution Take 0.25 mLs (5 mg total) by mouth every 3 (three) hours as needed for anxiety or shortness of breath., Starting 09/15/2014, Until Discontinued, Print    nystatin (MYCOSTATIN) 100000 UNIT/ML suspension Use as directed 5 mLs (500,000 Units total) in the mouth or throat 4 (four) times daily., Starting 09/15/2014, Until Discontinued, Print    predniSONE (DELTASONE) 20 MG tablet Take 2 tablets (40 mg total) by mouth daily with breakfast., Starting 09/17/2014, Until Discontinued, Normal      CONTINUE these medications which have CHANGED   Details  !! apixaban (ELIQUIS) 5 MG TABS tablet Take 2 tablets (10 mg total) by mouth 2 (two) times daily. And then start the lower dose of Eliquis in addition to Plavix on 7/15, Starting 09/15/2014, Until Discontinued, Print    !! apixaban (ELIQUIS) 5 MG TABS tablet Take 1 tablet (5 mg total) by mouth 2 (two) times daily., Starting 09/18/2014, Until Discontinued, Normal    clopidogrel (PLAVIX) 75 MG tablet Take 1 tablet (75 mg total) by mouth  daily. After decreasing dose of eliquis on 7/15, Starting 09/15/2014, Until Discontinued, Normal    diltiazem (CARDIZEM CD) 360 MG 24 hr capsule Take 1 capsule (360 mg total) by mouth daily., Starting 09/15/2014, Until Discontinued, Normal    furosemide (LASIX) 20 MG tablet Take 1 tablet (20 mg total) by mouth 3 (three) times daily as needed., Starting 09/15/2014, Until Discontinued, Print    metoprolol (LOPRESSOR) 50 MG tablet Take 1 tablet (50 mg total) by mouth 2 (two) times daily., Starting 09/15/2014, Until Discontinued, Print    traMADol (ULTRAM) 50 MG tablet TAKE 1 TABLET UP TO 2 TIMES A DAY, Print     !! - Potential duplicate medications found. Please discuss with provider.    CONTINUE these medications which have NOT CHANGED   Details  albuterol (VENTOLIN HFA) 108 (90 BASE) MCG/ACT inhaler Inhale 2 puffs into the lungs every 6 (six) hours as needed for wheezing. , Until Discontinued, Historical Med    Azelastine HCl (ASTELIN NA) Place 1 spray into the nose as needed (for congestion). , Until Discontinued, Historical Med    cimetidine (TAGAMET) 300 MG tablet TAKE 1 TABLET UP TO 4 TIMES A DAY, Normal    diphenhydramine-acetaminophen (TYLENOL PM) 25-500 MG TABS Take 1 tablet by mouth at bedtime as needed. sleep, Until Discontinued, Historical Med  fluticasone (FLONASE) 50 MCG/ACT nasal spray USE 2 SPRAYS INTO EACH NOSTRIL ONCE DAILY, Normal    montelukast (SINGULAIR) 10 MG tablet TAKE ONE TABLET AT BEDTIME, Normal    nitroGLYCERIN (NITROSTAT) 0.4 MG SL tablet Place 1 tablet (0.4 mg total) under the tongue every 5 (five) minutes as needed for chest pain., Starting 09/27/2012, Until Discontinued, Normal    Omega-3 Fatty Acids (FISH OIL) 1200 MG CAPS Take 1 capsule by mouth daily. , Until Discontinued, Historical Med    SPIRIVA HANDIHALER 18 MCG inhalation capsule INHALE THE CONTENTS OF ONE CAPSULE ONCE DAILY AS DIRECTED, Normal      STOP taking these medications     cefUROXime  (CEFTIN) 250 MG tablet      Cholecalciferol (VITAMIN D3) 1000 UNITS CAPS      LIPITOR 10 MG tablet      losartan (COZAAR) 50 MG tablet      SYMBICORT 160-4.5 MCG/ACT inhaler      triamcinolone cream (KENALOG) 0.1 %        Allergies  Allergen Reactions  . Aspirin Anaphylaxis, Shortness Of Breath and Swelling  . Nsaids Anaphylaxis, Shortness Of Breath and Swelling    Raise BP  . Septra [Sulfamethoxazole-Trimethoprim] Rash  . Albuterol     Can not tolerate high/ frequent doses due to [place] with using HFA PRN  . Benicar [Olmesartan Medoxomil]     dizzy  . Salicylates     SOB  . Ampicillin Rash   Follow-up Information    Follow up with [facility].   Why:  Registered Nurse,    Contact information:   [address] [place] [address] [phone]       Please follow up.   Why:         Follow up with [facility].   Why:  Roling Walker, Oxygen, Hospital Bed, Overbed Table, [employer], [employer].    Contact information:   [place] [address] [phone]        The results of significant diagnostics from this hospitalization (including imaging, microbiology, ancillary and laboratory) are listed below for reference.    Significant Diagnostic Studies: Dg Chest 1 View  08/26/2014   CLINICAL DATA:  Post right thoracentesis, pleural effusion.  EXAM: CHEST  1 VIEW  COMPARISON:  08/25/2014  FINDINGS: Slight decreased size of the right effusion which remains moderate. No visible pneumothorax. Continued right lower lobe atelectasis or infiltrate. Left lung is clear. Heart is normal size. No acute bony abnormality.  IMPRESSION: No pneumothorax following thoracentesis. Continued moderate right effusion with right lower lobe atelectasis or infiltrate.   Electronically Signed   By: Rolm Baptise M.D.   On: 08/26/2014 11:42   Dg Chest 2 View  09/13/2014   CLINICAL DATA:  Shortness of breath. Coronary artery disease. COPD.  EXAM: CHEST  2  VIEW  COMPARISON:  09/11/2014  FINDINGS: Small right pleural effusion again seen with associated atelectasis or scarring in the right lung base. These findings are stable since previous study.  No new or worsening areas of pulmonary opacity seen. Left lung remains clear. Heart size is within normal limits. Pulmonary hyperinflation again seen, consistent with COPD.  IMPRESSION: Stable appearance of small right pleural effusion and associated right basilar atelectasis versus scarring.  COPD.   Electronically Signed   By: Earle Gell M.D.   On: 09/13/2014 11:01   Dg Chest 2 View  09/11/2014   CLINICAL DATA:  81 year old male  with right pleural effusion and shortness of breath.  EXAM: CHEST  2 VIEW  COMPARISON:  Chest x-ray 09/10/2014.  FINDINGS: Small chronic right-sided pleural effusion is unchanged compared to recent priors. This is again associated with adjacent pleuroparenchymal scarring throughout the right mid to lower lung, similar to the recent prior examination. The possibility of consolidative airspace disease in the right middle and/or right lower lobe is not excluded, but is not strongly favored. Left lung is clear. Trace left pleural effusion. No evidence of pulmonary edema. Heart size is normal. Upper mediastinal contours are within normal limits allowing for patient positioning. Atherosclerosis in the thoracic aorta.  IMPRESSION: 1. Small complex chronic right pleural effusion with associated pleuroparenchymal scarring and/or atelectasis in the right mid to lower lung, unchanged. 2. Trace left pleural effusion. 3. Atherosclerosis.   Electronically Signed   By: Vinnie Langton M.D.   On: 09/11/2014 08:03   Dg Chest 2 View  09/10/2014   CLINICAL DATA:  Shortness of breath.  Cough.  Pneumonia.  COPD.  EXAM: CHEST  2 VIEW  COMPARISON:  09/08/2014  FINDINGS: Small right pleural effusion and right lower lobe infiltrate show no significant interval change. Left lung remains clear. No evidence of left-sided  pleural effusion. Heart size remains within normal limits. Pulmonary hyperinflation again noted, consistent with COPD.  IMPRESSION: No significant change in small right pleural effusion and right lower lobe infiltrate.   Electronically Signed   By: Earle Gell M.D.   On: 09/10/2014 09:07   Dg Chest 2 View  09/07/2014   CLINICAL DATA:  Persistent but improving shortness of breath over the course of her current hospitalization for right lower lobe pneumonia.  EXAM: CHEST  2 VIEW  COMPARISON:  09/06/2014 and earlier, including CT chest 08/30/2014.  FINDINGS: Cardiac silhouette normal in size. Thoracic aorta atherosclerotic, unchanged. Hilar and mediastinal contours otherwise unremarkable. Airspace consolidation in the right middle lobe and right lower lobe and moderate sized right pleural effusion, unchanged over the past 3 days, slightly improved since 5 days ago. Stable calcified granuloma in the medial left lower lobe. No new pulmonary parenchymal abnormalities. Pulmonary vascularity normal without evidence pulmonary edema. Degenerative changes and DISH involving the thoracic spine.  IMPRESSION: 1. Stable pneumonia and/or atelectasis in the right middle lobe and right lower lobe and stable moderate sized right pleural effusion. 2. No new abnormalities.   Electronically Signed   By: Evangeline Dakin M.D.   On: 09/07/2014 15:38   Dg Chest 2 View  08/31/2014   CLINICAL DATA:  Right pleural effusion with chest tube drainage, shortness of breath.  EXAM: CHEST  2 VIEW  COMPARISON:  Chest x-ray of June Jul 29, 2014 and chest CT scan of August 30, 2014.  FINDINGS: On the right there is mild volume loss and a moderate-sized pleural effusion. The right chest tube tip projects over the posterior aspect of the seventh rib and is unchanged. There is atelectasis or pneumonia at the right lung base which is stable. The left lung is well-expanded. There is a stable approximately 11 mm calcified nodule in the left lower lobe.  There is no mediastinal shift. The cardiac silhouette is mildly enlarged. The pulmonary vascularity is normal. The bony thorax exhibits no acute abnormality.  IMPRESSION: Persistent moderate-sized inferior right pleural effusion. The tip of the right chest tube lies along the superior aspect of the fluid collection similar to the positioning described on the CT scan.  Mild stable enlargement of the cardiac silhouette without  pulmonary edema.  Evidence of previous granulomatous infection.   Electronically Signed   By: David  Martinique M.D.   On: 08/31/2014 09:19   Dg Chest 2 View  08/25/2014   CLINICAL DATA:  81 year old male with shortness of breath and chest pain since last night. Pain radiating to the right shoulder with chills. Initial encounter. Recent diagnosis of pneumonia.  EXAM: CHEST  2 VIEW  COMPARISON:  08/13/2014 and earlier.  FINDINGS: Seated upright AP and lateral views of the chest. Continued moderate size right pleural effusion with dense right lung base opacification. Lower lung volumes. No pneumothorax or pulmonary edema. Stable cardiac size and mediastinal contours. The left lung remains clear aside from evidence of a chronic lower lobe pulmonary nodule which has not significantly changed since 2013. Calcified atherosclerosis of the aorta again noted. No acute osseous abnormality identified.  IMPRESSION: 1. Continued moderate right pleural effusion with associated right lung base collapse/consolidation. 2. No new cardiopulmonary abnormality. Chronic left lower lobe lung nodule.   Electronically Signed   By: Genevie Ann M.D.   On: 08/25/2014 08:17   Ct Chest Wo Contrast  08/30/2014   CLINICAL DATA:  Right pleural effusion and right-sided chest tube. Patient receiving intrapleural tPA.  EXAM: CT CHEST WITHOUT CONTRAST  TECHNIQUE: Multidetector CT imaging of the chest was performed following the standard protocol without IV contrast.  COMPARISON:  CT 08/25/2014 and chest x-ray 08/29/2014  FINDINGS:  Right anterior lateral chest tube courses into the posterior pleural space at the level of the mid lungs as tip is within the most superior aspect of the pleural fluid collection. Minimal subcutaneous emphysema over the right flank. There is a moderate size right pleural effusion with mild interval improvement. There is continued compressive atelectasis over the right lower lobe and right middle lobes. Small collection of air within the pleural space over the anterior aspect of the pleural effusion near the entry site at the chest tube. Calcified granuloma over the left lower lobe. Minimal scarring over the posterior left lower lobe. Airways are within normal.  Mild stable cardiomegaly. Calcification of the mitral valve annulus. Moderate calcification of the left main and 3 vessel coronary arteries unchanged. Coronary stents present. Tiny amount of pericardial fluid present. Mild-to-moderate calcified plaque over the thoracic aorta. No mediastinal, hilar or axillary adenopathy. Stable sub cm lymph node over the right retrocrural region at the level of the diaphragm.  Images through the upper abdomen are unchanged. There are degenerative changes of the spine.  IMPRESSION: Interval improvement in a moderate size right pleural fluid collection with associated compressive atelectasis over the right middle and lower lobes. Right-sided chest tube in with tip over the posterior pleural space in the most superior aspect of the pleural fluid collection.  Prior granulomas disease.  Atherosclerotic coronary artery disease with coronary stents present. Mild calcification of the mitral valve annulus. Mild cardiomegaly.   Electronically Signed   By: Marin Olp M.D.   On: 08/30/2014 09:31   Ct Chest W Contrast  08/25/2014   CLINICAL DATA:  Pleurisy and increase shortness of breath  EXAM: CT CHEST WITH CONTRAST  TECHNIQUE: Multidetector CT imaging of the chest was performed during intravenous contrast administration.   CONTRAST:  23mL OMNIPAQUE IOHEXOL 300 MG/ML  SOLN  COMPARISON:  None.  FINDINGS: THORACIC INLET/BODY WALL:  No acute abnormality.  MEDIASTINUM:  Normal heart size. No pericardial effusion. Extensive coronary atherosclerosis. No acute vascular abnormality. No adenopathy.  LUNG WINDOWS:  Large right pleural effusion which  is water density. The fluid is predominantly free-flowing based on comparison to previous chest x-ray, but there is some complex features with scalloping of the lung margins. The right lower lobe and lateral segment right middle lobe are collapsed. There is no evidence of pleural nodularity or lung mass. No pneumonia is suspected.  Calcified granuloma in the left lower lobe.  UPPER ABDOMEN:  Partly visualized left renal cyst, imaged portions stable from 09/29/2008 abdominal CT  OSSEOUS:  No acute fracture.  No suspicious lytic or blastic lesions.  IMPRESSION: Large right pleural effusion with lower lobe and middle lobe segmental collapse. The effusion is predominantly free-flowing based on prior chest x-ray, but there may be partial loculation where the lung is scalloped . No pleural or pulmonary cause is identified.   Electronically Signed   By: Monte Fantasia M.D.   On: 08/25/2014 15:09   Ct Angio Chest Pe W/cm &/or Wo Cm  09/08/2014   CLINICAL DATA:  Dyspnea this morning, acute respiratory failure  EXAM: CT ANGIOGRAPHY CHEST WITH CONTRAST  TECHNIQUE: Multidetector CT imaging of the chest was performed using the standard protocol during bolus administration of intravenous contrast. Multiplanar CT image reconstructions and MIPs were obtained to evaluate the vascular anatomy.  CONTRAST:  166mL OMNIPAQUE IOHEXOL 350 MG/ML SOLN  COMPARISON:  08/30/2014  FINDINGS: Left arm contrast injection. SVC is patent. Right ventricle is nondilated. Mild dilatation of central pulmonary arteries. Satisfactory opacification of pulmonary arteries noted, and there is no evidence of pulmonary emboli. Patent bilateral  pulmonary veins. Extensive coronary calcifications. Adequate contrast opacification of the thoracic aorta with no evidence of dissection, aneurysm, or stenosis. There is classic 3-vessel brachiocephalic arch anatomy without proximal stenosis. Moderate plaque in the aortic arch and descending aorta.  Small left and moderate right pleural effusions. There is some pleural enhancement on the right with the loculation posteriorly at the right lung base and multiple gas bubbles within the pleural space. There is a small locule of gas in the pleural space anteriorly, and a small gas bubble in the subcutaneous tissues in the right lateral chest wall probably from previous chest tube placement. Calcified granuloma in the left lower lobe. No pericardial effusion. Borderline enlarged left hilar lymph nodes. 12 mm right suprahilar lymph node. Subcentimeter subcarinal and AP window lymph nodes.  Consolidation/atelectasis posteriorly at the right lung base. Minimal dependent atelectasis posteriorly in the left lower lobe. Bridging osteophytes across multiple contiguous levels in the thoracic spine. Spondylitic changes in the visualized lower cervical spine. Sternum is intact. Visualized portions of upper abdomen unremarkable.  Review of the MIP images confirms the above findings.  IMPRESSION: 1. Negative for acute PE or thoracic aortic dissection. 2. Moderate residual loculated right pleural effusion with some pleural enhancement suggesting possible empyema. 3. Adjacent consolidation in the right lower lobe suggesting pneumonia. 4. Atherosclerosis, including aortic and coronary artery disease. Please note that although the presence of coronary artery calcium documents the presence of coronary artery disease, the severity of this disease and any potential stenosis cannot be assessed on this non-gated CT examination. Assessment for potential risk factor modification, dietary therapy or pharmacologic therapy may be warranted, if  clinically indicated.   Electronically Signed   By: Lucrezia Europe M.D.   On: 09/08/2014 14:07   Dg Chest Port 1 View  09/08/2014   CLINICAL DATA:  Dyspnea and shortness of breath with onset this morning ; history of COPD, leukemia, bladder malignancy, coronary artery disease  EXAM: PORTABLE CHEST - 1 VIEW  COMPARISON:  PA and lateral chest of September 07, 2014  FINDINGS: The left lung is well-expanded and clear. On the right there is persistent increased density in the mid and lower lung with partial obscuration of the hemidiaphragm. The heart is normal in size. The pulmonary vascularity is not engorged. The observed bony thorax is unremarkable.  IMPRESSION: Persistent increased interstitial and alveolar density in the right mid and lower lung consistent with atelectasis or pneumonia. The known small right pleural effusion is not clearly evident on this portable view.   Electronically Signed   By: David  Martinique M.D.   On: 09/08/2014 10:10   Dg Chest Port 1 View  09/06/2014   CLINICAL DATA:  Short of breath for several weeks.  EXAM: PORTABLE CHEST - 1 VIEW  COMPARISON:  09/05/2014.  CT 08/30/2014.  FINDINGS: Cardiopericardial silhouette appears unchanged, within normal limits for projection. Aortic arch atherosclerosis. Monitoring leads project over the chest.  LEFT lung base shows atelectasis or scarring.  The RIGHT lung base shows persistent airspace disease and opacity extending to the RIGHT hemidiaphragm. RIGHT pleural effusion.  IMPRESSION: No interval change in the chest. Persistent RIGHT basilar collapse/consolidation and effusion.   Electronically Signed   By: Dereck Ligas M.D.   On: 09/06/2014 10:18   Dg Chest Port 1 View  09/05/2014   CLINICAL DATA:  Shortness of breath.  Respiratory failure.  EXAM: PORTABLE CHEST - 1 VIEW  COMPARISON:  One-view chest 09/04/2014  FINDINGS: The heart size is normal atherosclerotic changes are again seen at the aortic arch. Right basilar airspace disease is unchanged.  Biapical pleural parenchymal scarring is stable.  IMPRESSION: 1. Stable right lower lobe interstitial and airspace disease concerning for pneumonia. 2. Stable right pleural effusion. 3. Atherosclerosis.   Electronically Signed   By: San Morelle M.D.   On: 09/05/2014 08:56   Dg Chest Port 1 View  09/04/2014   CLINICAL DATA:  Status post chest tube removal. Right pleural effusion.  EXAM: PORTABLE CHEST - 1 VIEW  COMPARISON:  09/04/2014 at 7:04 a.m.  FINDINGS: No pneumothorax.  Right lung base opacity consistent with a small residual effusion and associated atelectasis, unchanged from the earlier study. No pulmonary edema. No new lung abnormalities.  Cardiac silhouette normal in size. No mediastinal or hilar masses or evidence of adenopathy.  IMPRESSION: 1. No change from the earlier study. Persistent right lung base opacity most likely combination of atelectasis and a small pleural effusion. 2. No pneumothorax.   Electronically Signed   By: Lajean Manes M.D.   On: 09/04/2014 16:35   Dg Chest Port 1 View  09/04/2014   CLINICAL DATA:  Followup respiratory failure.  EXAM: PORTABLE CHEST - 1 VIEW  COMPARISON:  09/03/2014  FINDINGS: Persistent right lung base opacity is noted consistent with a combination of pleural fluid and atelectasis. There is minor persistent subsegmental atelectasis at the left lung base. No pulmonary edema. No pneumothorax.  Cardiopericardial silhouette is normal in size. No mediastinal or hilar masses.  IMPRESSION: 1. No change from the previous day's study. 2. Persistent right basilar opacity most likely combination of atelectasis and pleural fluid. 3. No pneumothorax.   Electronically Signed   By: Lajean Manes M.D.   On: 09/04/2014 08:13   Dg Chest Port 1 View  09/03/2014   CLINICAL DATA:  Respiratory failure, right-sided chest tube  EXAM: PORTABLE CHEST - 1 VIEW  COMPARISON:  Portable chest x-ray of September 02, 2014  FINDINGS: The large caliber right chest  tube has been removed.  The pigtail catheter remains in place with the pigtail located in the medial costophrenic gutter. There is stable increased density at the right lung base consistent with pleural fluid and/or pleural thickening. There is no pneumothorax. The left lung is clear. The heart and mediastinal structures are normal. The observed bony thorax is unremarkable.  IMPRESSION: Interval removal of the large caliber chest tube with stable positioning of the small caliber tube. There is stable increased density at the right lung base consistent with pleural fluid or thickening and likely atelectasis.   Electronically Signed   By: David  Martinique M.D.   On: 09/03/2014 07:27   Dg Chest Port 1 View  09/02/2014   CLINICAL DATA:  Chest tube treatment of right pleural effusion  EXAM: PORTABLE CHEST - 1 VIEW  COMPARISON:  Chest x-ray of August 31, 2014  FINDINGS: The large caliber chest tube tip on the right projects over the posterior aspect of the seventh rib. The new small caliber tube has its pigtail overlying the medial costovertebral angle on the right. There is persistent right lower lobe atelectasis. The left lung is clear. There is no mediastinal shift. There is no pneumothorax. A small amount of pleural fluid likely remains on the right. The heart is normal in size. The pulmonary vascularity is mildly prominent centrally. The bony thorax exhibits no acute abnormality.  IMPRESSION: Stable appearance of the right hemithorax with the presence of 2 chest tubes. There remains parenchymal consolidation as well as small amount of pleural fluid on the right.   Electronically Signed   By: David  Martinique M.D.   On: 09/02/2014 07:41   Dg Chest Port 1 View  08/29/2014   CLINICAL DATA:  Evaluate pleural effusions  EXAM: PORTABLE CHEST - 1 VIEW  COMPARISON:  08/28/2014  FINDINGS: Right chest tube in place. No pneumothorax identified. The right-sided pleural effusion is unchanged in volume from previous exam. There is atelectasis in the right  base. Gas is noted within the soft tissues of the right chest wall surrounding the chest tube.  IMPRESSION: 1. No change in right pleural effusion.   Electronically Signed   By: Kerby Moors M.D.   On: 08/29/2014 13:12   Dg Chest Port 1 View  08/28/2014   CLINICAL DATA:  Chest tube placement for effusion.  EXAM: PORTABLE CHEST - 1 VIEW  COMPARISON:  Study obtained earlier in the day  FINDINGS: There is a chest tube on the right without demonstrable pneumothorax. Right effusion is smaller. There is a fairly small right effusion with right base consolidation and patchy atelectasis. Lungs elsewhere clear. There is no appreciable interstitial edema. The heart size and pulmonary vascularity are normal. There is atherosclerotic change in the aorta. No adenopathy. No bone lesions.  IMPRESSION: Chest tube in place on the right. No pneumothorax. Note that there is soft tissue air tracking along the chest tube in the lateral right hemithorax inferiorly. Right pleural effusion is smaller compared to earlier in the day. There is a residual right effusion with right base consolidation and patchy atelectasis.  There is no evidence of pulmonary edema. No change in cardiac silhouette.   Electronically Signed   By: Lowella Grip III M.D.   On: 08/28/2014 15:19   Dg Chest Port 1 View  08/28/2014   CLINICAL DATA:  Pleural effusion.  Pulmonary infection.  EXAM: PORTABLE CHEST - 1 VIEW  COMPARISON:  08/27/2014.  FINDINGS: Right chest tube in stable position. Mediastinum and hilar  structures are normal. Cardiomegaly with normal pulmonary vascularity. Right lower lobe infiltrate consistent with pneumonia. Right pleural effusion. Right chest wall subcutaneous emphysema again noted.  IMPRESSION: 1.  Right chest tube in stable position.  No pneumothorax.  2. Persistent dense right lower lobe infiltrate and right pleural effusion. No interim change.   Electronically Signed   By: Marcello Moores  Register   On: 08/28/2014 08:15   Dg Chest  Port 1 View  08/27/2014   CLINICAL DATA:  Chest tube placement.  EXAM: PORTABLE CHEST - 1 VIEW  COMPARISON:  Single view of the chest 08/26/2014.  FINDINGS: A new right chest tube is in place. Right pleural effusion and basilar airspace disease are again seen. No pneumothorax is identified. The left lung appears clear. Heart size is normal.  IMPRESSION: Status post right chest tube placement. Right pleural effusion basilar airspace disease do not appear notably changed. No pneumothorax is seen.   Electronically Signed   By: Inge Rise M.D.   On: 08/27/2014 18:30   Dg Swallowing Func-speech Pathology  09/01/2014    Objective Swallowing Evaluation:    Patient Details  Name: Johnathan Strong MRN: [id number] Date of Birth: [birth year]  Today's Date: 09/01/2014 Time: SLP Start Time (ACUTE ONLY): 1245-SLP Stop Time (ACUTE ONLY): 1320 SLP Time Calculation (min) (ACUTE ONLY): 35 min  Past Medical History:  Past Medical History  Diagnosis Date  . Osteopenia 2006  . Hyperlipidemia   . BPH (benign prostatic hypertrophy)   . Transient global amnesia   . Osteoarthritis   . History of TIA (transient ischemic attack) 1980'S    NO RESIDUAL  . COPD (chronic obstructive pulmonary disease) with emphysema   . Chronic back pain   . Itching SECONDARY TO CLL-- CONTROLLED W/ SINGULAIR  . Frequency   . Nocturia   . Hypertension CARDIOLOGIST- DR BRACKBILL-- LAST VISIT 12-15-2010 NOTE IN  EPIC  . CLL (chronic lymphocytic leukemia) LAST PLT COUNT APRL 2012  168    Managed at [facility]  . Bladder cancer   . Aspirin allergy     Eyes swell  . Asthma   . Dyspnea on exertion   . CAD (coronary artery disease)     a. 08/2012 Cath: LM nl, LAD 90p, LCX 20-30, RCA 100ost fills via L->R  collats, EF 55-65%;  b. 09/2012 PCI of prox LAD with 3.0x16 Promus DES.   Past Surgical History:  Past Surgical History  Procedure Laterality Date  . Excisional hemorrhoidectomy  1980  . Cardiovascular stress test  07/2005    a. 07/2005- no reversible  ischemia, normal EF b. 06/2009- EF 73%, no  reversible ischemia, inferolateral TWIs at rest, upright in recovery c.  08/2012- mediuim-sized partially reversible basal to mid inferior and  inferoseptal perfusion defect c/w with prior infarction and peri-infarct  ischemia; EF 68% and borderline ST/T changes on stress ECG   . Transurethral resection of bladder tumor  10-19-2008    AND DILATION URETHRAL STRICTURE  . Cataract extraction w/ intraocular lens  implant, bilateral Bilateral ~  2007  . Transthoracic echocardiogram  01-03-2011    LVEF 22-63%, grade 1 diastolic dysfunction, no WMAs or structural  abnormalities  . Cystoscopy with biopsy  10/30/2011    Procedure: CYSTOSCOPY WITH BIOPSY;  Surgeon: Claybon Jabs, MD;   Location: [facility];  Service: Urology;  Laterality: N/A;   . Cardiac catheterization  08/27/2012  . Coronary angioplasty with stent placement  09/26/2012    "  1" (09/26/2012)  . Coronary stent placement  09/26/12  . Left heart catheterization with coronary angiogram N/A 08/27/2012    Procedure: LEFT HEART CATHETERIZATION WITH CORONARY ANGIOGRAM;  Surgeon:  Peter M Martinique, MD;  Location: [facility] CATH LAB;  Service: Cardiovascular;   Laterality: N/A;  . Percutaneous coronary stent intervention (pci-s) N/A 09/26/2012    Procedure: PERCUTANEOUS CORONARY STENT INTERVENTION (PCI-S);  Surgeon:  Peter M Martinique, MD;  Location: [facility] CATH LAB;  Service: Cardiovascular;   Laterality: N/A;   HPI:  Other Pertinent Information: 81 y.o. male with past history of COPD,  paroxysmal A. fib on Eliquis, CAD, hypertension was recently discharged  from [facility] 6/9 after treatment for community-acquired  pneumonia. He presented to the ER where he was noted to have a white count  of 24K, chest x-ray with worsening pneumonia and moderate right pleural  effusion. Pt with chest tube.Previous admission for RLL PNA  with bedside  swallow completed 6/9: notes WFL oropharyngeal swallow with suspected  esophageal  component.   No Data Recorded  Assessment / Plan / Recommendation CHL IP CLINICAL IMPRESSIONS 09/01/2014  Therapy Diagnosis Mild oral phase dysphagia;Suspected primary esophageal  dysphagia  Clinical Impression Pt demonstrates very mild oral dysphagia warranting  basic precautions to reduce risk of aspiration. The pt has trace premature  spillage and trace oral residuals after the swallow, both of which spill  to the valleculae and pyriforms prior to the swallow with 1-2 instances of  trace penetration, expelled with a swallow. Oropharyngeal function is  otherwise WNL. An esophageal sweep does reveal stasis throughout the  distal to mid esophagus with upward movement of residual. The barium  tablet remained at the GE junction at the end of the test. Pt is  recommended to follow precautions to reduce risk of aspiration including  upright positioning, a second swallow and staying upright 30-60 minutes  after meal. Despite the precautions the pts highest risk is aspiration of  esophageal stasis after meals. Suggest f/u esophageal testing. SLP will  f/u for reinforcement of precautions.       CHL IP TREATMENT RECOMMENDATION 09/01/2014  Treatment Recommendations Therapy as outlined in treatment plan below     CHL IP DIET RECOMMENDATION 09/01/2014  SLP Diet Recommendations Age appropriate regular solids;Thin  Liquid Administration via (None)  Medication Administration Whole meds with liquid  Compensations Small sips/bites  Postural Changes and/or Swallow Maneuvers (None)     CHL IP OTHER RECOMMENDATIONS 09/01/2014  Recommended Consults Consider esophageal assessment;Consider GI evaluation   Oral Care Recommendations Oral care BID  Other Recommendations (None)     No flowsheet data found.   CHL IP FREQUENCY AND DURATION 09/01/2014  Speech Therapy Frequency (ACUTE ONLY) min 2x/week  Treatment Duration 1 week       No flowsheet data found.  No flowsheet data found.        Herbie [place], [place] CCC-SLP [phone]  DeBlois, Katherene Ponto 09/01/2014, 2:57 PM    Ct Image Guided Drainage By Percutaneous Catheter  09/01/2014   INDICATION: History of community acquired pneumonia, post right-sided chest tube placement with residual loculated fluid seen within the caudal aspect of the right pleural space. Request made for placement of additional CT-guided percutaneous chest tube for infection source control purposes.  EXAM: CT IMAGE GUIDED DRAINAGE BY PERCUTANEOUS CATHETER  COMPARISON:  Chest CT- 08/10/2014; 08/25/2014; chest radiograph - 08/31/2014; ultrasound-guided right-sided thoracentesis - 08/26/2014  MEDICATIONS: The patient is currently admitted to the hospital and receiving intravenous antibiotics.  The antibiotics were administered within an appropriate time frame prior to the initiation of the procedure.  ANESTHESIA/SEDATION: Fentanyl 25 mcg IV; Versed 5 mg IV  Total Moderate Sedation time  20 minutes  CONTRAST:  None  COMPLICATIONS: None immediate  PROCEDURE: Informed written consent was obtained from the patient after a discussion of the risks, benefits and alternatives to treatment. The patient was placed right lateral decubitus on the CT gantry and a pre procedural CT was performed re-demonstrating the known loculated fluid collection within the right pleural space. The procedure was planned. A timeout was performed prior to the initiation of the procedure.  The right inferior posterior lateral chest wall was prepped and draped in the usual sterile fashion. The overlying soft tissues were anesthetized with 1% lidocaine with epinephrine. Appropriate trajectory was planned with the use of a 22 gauge spinal needle. An 18 gauge trocar needle was advanced into the right pleural space and a short Amplatz super stiff wire was coiled within the collection. Appropriate positioning was confirmed with a limited CT scan. The tract was serially dilated allowing placement of a 12 [place] all-purpose drainage catheter. Appropriate positioning was  confirmed with a limited postprocedural CT scan demonstrating the and of the percutaneous drainage catheter coiled and locked within the posterior medial aspect of the right pleural space (series 7).  Approximately 50 cc blood tinged pleural fluid was aspirated. The tube was connected to a pleural vac device and sutured in place. A dressing was placed. The patient tolerated the procedure well without immediate post procedural complication.  IMPRESSION: Successful CT guided placement of a 59 French all purpose drain catheter into the caudal aspect of the right pleural space with aspiration of approximately 50 mL of blood tinged pleural fluid. Samples were sent to the laboratory as requested by the ordering clinical team.   Electronically Signed   By: Sandi Mariscal M.D.   On: 09/01/2014 16:50   US Thoracentesis Asp Pleural Space W/img Guide  08/26/2014   INDICATION: Symptomatic R sided pleural effusion  EXAM: US THORACENTESIS ASP PLEURAL SPACE W/IMG GUIDE  COMPARISON:  None.  MEDICATIONS: 10 cc 1% lidocaine  COMPLICATIONS: None immediate  TECHNIQUE: Informed written consent was obtained from the patient after a discussion of the risks, benefits and alternatives to treatment. A timeout was performed prior to the initiation of the procedure.  Initial ultrasound scanning demonstrates a R pleural effusion. The lower chest was prepped and draped in the usual sterile fashion. 1% lidocaine was used for local anesthesia.  Under direct ultrasound guidance, a 19 gauge, 7-cm, Yueh catheter was introduced. An ultrasound image was saved for documentation purposes. The thoracentesis was performed. The catheter was removed and a dressing was applied. The patient tolerated the procedure well without immediate post procedural complication. The patient was escorted to have an upright chest radiograph.  FINDINGS: A total of approximately 1 liters of bloody fluid was removed. Requested samples were sent to the laboratory.  IMPRESSION:  Successful ultrasound-guided R sided thoracentesis yielding 1 liters of pleural fluid.  Read by:; Lavonia Drafts [facility]   Electronically Signed   By: Corrie Mckusick D.O.   On: 08/26/2014 15:53    Microbiology: No results found for this or any previous visit (from the past 240 hour(s)).   Labs: Basic Metabolic Panel:  Recent Labs Lab 09/10/14 0036 09/12/14 0329  NA 134* 135  K 4.5 4.4  CL 93* 94*  CO2 30 31  GLUCOSE 225* 142*  BUN 27* 38*  CREATININE 1.34* 1.25*  CALCIUM 8.3* 8.1*  PHOS 3.1  --    Liver Function Tests:  Recent Labs Lab 09/10/14 0036  ALBUMIN 2.3*   No results for input(s): LIPASE, AMYLASE in the last 168 hours. No results for input(s): AMMONIA in the last 168 hours. CBC:  Recent Labs Lab 09/10/14 0036 09/11/14 0351 09/12/14 0329 09/14/14 1124  WBC 11.4* 16.3* 14.6* 25.9*  NEUTROABS 6.2  --  8.8* 15.8*  HGB 8.1* 8.0* 7.8* 10.0*  HCT 25.1* 25.0* 24.5* 31.2*  MCV 90.3 89.9 89.1 89.4  PLT 291 288 282 302   Cardiac Enzymes: No results for input(s): CKTOTAL, CKMB, CKMBINDEX, TROPONINI in the last 168 hours. BNP: BNP (last 3 results)  Recent Labs  04/21/14 1541 08/25/14 0700  BNP 484.0* 224.5*    ProBNP (last 3 results) No results for input(s): PROBNP in the last 8760 hours.  CBG: No results for input(s): GLUCAP in the last 168 hours.     SignedNita Sells  [facility] 09/16/2014, 9:08 AM

## 2014-09-22 ENCOUNTER — Telehealth: Payer: Self-pay | Admitting: *Deleted

## 2014-09-22 ENCOUNTER — Other Ambulatory Visit: Payer: Self-pay | Admitting: *Deleted

## 2014-09-22 MED ORDER — METOPROLOL TARTRATE 50 MG PO TABS: 25.0000 mg | ORAL_TABLET | Freq: Two times a day (BID) | ORAL | Status: DC

## 2014-09-22 NOTE — Telephone Encounter (Signed)
Called new order into rick at hospice

## 2014-09-22 NOTE — Telephone Encounter (Signed)
Pt is on Lopressor 50 mg bid, his BP was 91/60 last evening, the held the Lopressor. BP was 126/60 this am. Can hospice have an order to decrease lopressor to 25 bid?

## 2014-09-22 NOTE — Telephone Encounter (Signed)
Yes that is fine

## 2014-10-01 ENCOUNTER — Telehealth: Payer: Self-pay | Admitting: Cardiology

## 2014-10-01 NOTE — Telephone Encounter (Signed)
New Message        Hospice calling stating that when pt stands up his BP is dropping, BP running low, pt's HR is also increased (108-112). Please call back and advise.

## 2014-10-01 NOTE — Telephone Encounter (Signed)
Advised patient, verbalized understanding  

## 2014-10-01 NOTE — Telephone Encounter (Signed)
Spoke with Johnathan Strong and [name] blood pressure today sitting 134/64 and standing 100/48, patient lightheaded when he stood up. Heart rate up to 108-112 (higher than it has been running with Johnathan Strong) PCP just decreased Metoprolol 7/19 secondary to low blood pressure  Patient is taking Lasix 20 mg three times a, Metoprolol 50 mg 1/2 twice a day, and Cardizem 360 mg daily. Does take all 3 of these in the morning  This morning patient got up feeling good, blood pressure 137/90 around 6:30 am (before meds) Took his morning medications and around 9:00 am blood pressure down to 102/77  Patient feels better in the morning but then takes his medications and starts to feel more shortness of breath, lightheaded, and weaker. States he gets to feeling better as the day goes on.  Will forward to  Dr. Mare Ferrari for review

## 2014-10-01 NOTE — Telephone Encounter (Signed)
I would suggest switching his cardizem to lunchtime instead of in the am.

## 2014-10-05 ENCOUNTER — Telehealth: Payer: Self-pay | Admitting: Cardiology

## 2014-10-05 MED ORDER — DILTIAZEM HCL ER COATED BEADS 120 MG PO CP24
ORAL_CAPSULE | ORAL | Status: DC
Start: 2014-10-05 — End: 2014-10-22

## 2014-10-05 NOTE — Telephone Encounter (Signed)
Reviewed with Truitt Merle, NP and Cardizem CD can be decreased to 120 mg daily at bedtime.  Increase salt intake. I placed call to [facility] and left message to call back

## 2014-10-05 NOTE — Telephone Encounter (Signed)
Agree with med adjustments being made.

## 2014-10-05 NOTE — Telephone Encounter (Signed)
I spoke with Beth at [facility] and gave her instructions below. Will send Cardizem prescription to [facility]

## 2014-10-05 NOTE — Telephone Encounter (Signed)
New message      Pt c/o BP issue: STAT if pt c/o blurred vision, one-sided weakness or slurred speech  1. What are your last 5 BP readings? 89/53 2. Are you having any other symptoms (ex. Dizziness, headache, blurred vision, passed out)?  3. What is your BP issue? Pt want to know if he should continue metoprolol and cardizem since his bp is low?

## 2014-10-05 NOTE — Telephone Encounter (Signed)
Spoke with Beth at [facility]. She reports private duty nursing assistant is with pt today. Pt's blood pressure is 89/53 and heart rate 107. Took lopressor this AM but is due for lunchtime dose of Cardizem.  Our records indicate pt is on Cardizem 360 mg daily but Beth reports pt decreased this on his own to 180 mg daily.  She does not known when he made change.  On 7/29 blood pressure was 112/67 and heart rate 140.  Later that day it was 100/50. No heart rate available.  Cardizem was changed to lunch time on 7/28 due to pt being orthostatic. Lopressor was decreased to 25 mg twice daily on 7/19 due to low blood pressure.  Beth reports pt states he feels weaker this AM and has not get up and go. Receives several breathing treatments. Will review with provider in office.

## 2014-10-07 ENCOUNTER — Telehealth: Payer: Self-pay | Admitting: Family Medicine

## 2014-10-08 ENCOUNTER — Other Ambulatory Visit: Payer: Self-pay | Admitting: Family Medicine

## 2014-10-08 NOTE — Telephone Encounter (Signed)
Pt wife aware -we will come out on monday

## 2014-10-09 NOTE — Telephone Encounter (Signed)
Last seen 07/05/14 DWM  If approved print

## 2014-10-12 ENCOUNTER — Encounter: Payer: Self-pay | Admitting: Family Medicine

## 2014-10-12 ENCOUNTER — Ambulatory Visit: Payer: Medicare Other | Admitting: Family Medicine

## 2014-10-12 VITALS — BP 110/62 | HR 135 | Temp 97.3°F | Ht 68.0 in

## 2014-10-12 DIAGNOSIS — J449 Chronic obstructive pulmonary disease, unspecified: Secondary | ICD-10-CM | POA: Diagnosis not present

## 2014-10-12 DIAGNOSIS — I4891 Unspecified atrial fibrillation: Secondary | ICD-10-CM | POA: Diagnosis not present

## 2014-10-12 DIAGNOSIS — C9110 Chronic lymphocytic leukemia of B-cell type not having achieved remission: Secondary | ICD-10-CM | POA: Diagnosis not present

## 2014-10-12 DIAGNOSIS — E559 Vitamin D deficiency, unspecified: Secondary | ICD-10-CM

## 2014-10-12 DIAGNOSIS — J9600 Acute respiratory failure, unspecified whether with hypoxia or hypercapnia: Secondary | ICD-10-CM | POA: Diagnosis not present

## 2014-10-12 DIAGNOSIS — I119 Hypertensive heart disease without heart failure: Secondary | ICD-10-CM | POA: Diagnosis not present

## 2014-10-12 DIAGNOSIS — E785 Hyperlipidemia, unspecified: Secondary | ICD-10-CM | POA: Diagnosis not present

## 2014-10-12 DIAGNOSIS — I2510 Atherosclerotic heart disease of native coronary artery without angina pectoris: Secondary | ICD-10-CM

## 2014-10-12 MED ORDER — TIOTROPIUM BROMIDE MONOHYDRATE 18 MCG IN CAPS: ORAL_CAPSULE | RESPIRATORY_TRACT | Status: DC

## 2014-10-12 NOTE — Patient Instructions (Addendum)
Medicare Annual Wellness Visit  [place] and the medical providers at [facility] strive to bring you the best medical care.  In doing so we not only want to address your current medical conditions and concerns but also to detect new conditions early and prevent illness, disease and health-related problems.    Medicare offers a yearly Wellness Visit which allows our clinical staff to assess your need for preventative services including immunizations, lifestyle education, counseling to decrease risk of preventable diseases and screening for fall risk and other medical concerns.    This visit is provided free of charge (no copay) for all Medicare recipients. The clinical pharmacists at [facility] have begun to conduct these Wellness Visits which will also include a thorough review of all your medications.    As you primary medical provider recommend that you make an appointment for your Annual Wellness Visit if you have not done so already this year.  You may set up this appointment before you leave today or you may call back ([phone]) and schedule an appointment.  Please make sure when you call that you mention that you are scheduling your Annual Wellness Visit with the clinical pharmacist so that the appointment may be made for the proper length of time.     Continue current medications. Continue good therapeutic lifestyle changes which include good diet and exercise. Fall precautions discussed with patient. If an FOBT was given today- please return it to our front desk. If you are over 70 years old - you may need Prevnar 21 or the adult Pneumonia vaccine.   After your visit with [place] today you will receive a survey in the mail or online from [employer] regarding your care with [place]. Please take a moment to fill this out. Your feedback is very important to [place] as you can help [place] better understand your patient needs as well as  improve your experience and satisfaction. WE CARE ABOUT YOU!!!   The patient will continue with his current treatment and I will call his cardiologist and discuss options for helping to lower his heart rate while keeping his blood pressure at a reasonable level He will continue with the brovana and budesonide at nighttime and he will try to use the Symbicort and Spiriva in the morning to see if this makes any difference with his breathing or not. He was told to resume the brovana and Pulmicort twice daily if trying the Symbicort with  spirva just in the morning did not help  The patient was encouraged to be as active around the house as he felt like he could be, which is in essence doing his on physical therapy to stay as strong as possible

## 2014-10-12 NOTE — Progress Notes (Signed)
Subjective:    Patient ID: Johnathan Strong, male    DOB: 03/01/1924, 81 y.o.   MRN: [id number]  HPI Patient seen today at in- home visit for follow up on COPD with end-stage lung disease, hypertension and atrial fibrillation. He is taking medications regularly. These were reviewed with him at the beginning of the visit. He has a in -home caregiver and he is accompanied today by his wife, son, sitter and daughter in law. He seems to have a great support group. The patient denies chest pain or problems with his stomach. He is not having any problems with passing his water. His biggest issues or weakness shortness of breath blood pressures which are running low fast heart rate and fatigue. He has oxygen by nasal cannula at 2 L/m. He was alert but appeared somewhat pale in color. His medications were reviewed with him and he was currently doing [place] and budesonide by nebulizer twice a day, metoprolol 50 a half a one twice a day Lasix 20 one half twice a day and Cardizem 120 daily.        Patient Active Problem List   Diagnosis Date Noted  . Acute respiratory failure   . Empyema   . Palliative care by specialist   . Dyspnea   . Respiratory failure   . Essential hypertension   . SOB (shortness of breath)   . Chest tube in place   . Pleural effusion associated with pulmonary infection   . Blood poisoning   . Pleural effusion   . Pleural effusion, right   . Sepsis 08/26/2014  . HCAP (healthcare-associated pneumonia) 08/25/2014  . Pleural effusion on right 08/25/2014  . CAP (community acquired pneumonia)   . Pneumonia 08/09/2014  . Paroxysmal atrial fibrillation 05/14/2014  . Atrial fibrillation 04/26/2014  . Atrial fibrillation with RVR 04/26/2014  . Acute respiratory failure with hypoxia 04/21/2014  . Bladder cancer 03/10/2014  . Testosterone deficiency 06/24/2013  . ASCVD (arteriosclerotic cardiovascular disease) 06/24/2013  . Hyperlipemia 10/21/2012  . Unstable angina 09/27/2012    . CAD (coronary artery disease)   . DOE (dyspnea on exertion) 08/02/2012  . Dizziness 08/02/2012  . Chronic lymphatic leukemia 07/04/2012  . Hypertension 12/15/2010  . Chronic lymphocytic leukemia 12/15/2010  . COPD GOLD III 08/30/2010   Outpatient Encounter Prescriptions as of 10/12/2014  Medication Sig  . apixaban (ELIQUIS) 5 MG TABS tablet Take 1 tablet (5 mg total) by mouth 2 (two) times daily.  [name] arformoterol (BROVANA) 15 MCG/2ML NEBU Take 2 mLs (15 mcg total) by nebulization 2 (two) times daily.  . budesonide (PULMICORT) 0.5 MG/2ML nebulizer solution Take 2 mLs (0.5 mg total) by nebulization 2 (two) times daily.  . clopidogrel (PLAVIX) 75 MG tablet Take 1 tablet (75 mg total) by mouth daily. After decreasing dose of eliquis on 7/15  . diltiazem (CARDIZEM CD) 120 MG 24 hr capsule Take one daily by mouth at bedtime  . diphenhydramine-acetaminophen (TYLENOL PM) 25-500 MG TABS Take 1 tablet by mouth at bedtime as needed. sleep  . furosemide (LASIX) 20 MG tablet Take 1 tablet (20 mg total) by mouth 3 (three) times daily as needed. (Patient taking differently: Take 10 mg by mouth 2 (two) times daily. )  . metoprolol (LOPRESSOR) 50 MG tablet Take 0.5 tablets (25 mg total) by mouth 2 (two) times daily.  . montelukast (SINGULAIR) 10 MG tablet TAKE ONE TABLET AT BEDTIME  . Morphine Sulfate (MORPHINE CONCENTRATE) 10 MG/0.5ML SOLN concentrated solution Take 0.25 mLs (5  mg total) by mouth every 3 (three) hours as needed for anxiety or shortness of breath.  . predniSONE (DELTASONE) 20 MG tablet TAKE (2) TABLETS BY MOUTH ONCE DAILY. (Patient taking differently: TAKE 1 and 1/2  TABLETS BY MOUTH ONCE DAILY.)  . tiotropium (SPIRIVA HANDIHALER) 18 MCG inhalation capsule INHALE THE CONTENTS OF ONE CAPSULE ONCE DAILY AS DIRECTED. Hospice pt.  . traMADol (ULTRAM) 50 MG tablet TAKE ONE TABLET BY MOUTH UP TO 2 TIMES A DAY.  . [DISCONTINUED] Azelastine HCl (ASTELIN NA) Place 1 spray into the nose as needed (for  congestion).   . [DISCONTINUED] cimetidine (TAGAMET) 300 MG tablet TAKE 1 TABLET UP TO 4 TIMES A DAY (Patient taking differently: TAKE 1 TABLET UP TO 4 TIMES A DAY as needed)  . [DISCONTINUED] fluticasone (FLONASE) 50 MCG/ACT nasal spray USE 2 SPRAYS INTO EACH NOSTRIL ONCE DAILY (Patient taking differently: USE 2 SPRAYS INTO EACH NOSTRIL ONCE DAILY as needed for allergies.)  . [DISCONTINUED] SPIRIVA HANDIHALER 18 MCG inhalation capsule INHALE THE CONTENTS OF ONE CAPSULE ONCE DAILY AS DIRECTED  . nitroGLYCERIN (NITROSTAT) 0.4 MG SL tablet Place 1 tablet (0.4 mg total) under the tongue every 5 (five) minutes as needed for chest pain. (Patient not taking: Reported on 10/12/2014)  . [DISCONTINUED] albuterol (VENTOLIN HFA) 108 (90 BASE) MCG/ACT inhaler Inhale 2 puffs into the lungs every 6 (six) hours as needed for wheezing.   . [DISCONTINUED] guaiFENesin (MUCINEX) 600 MG 12 hr tablet Take 2 tablets (1,200 mg total) by mouth 2 (two) times daily.  . [DISCONTINUED] lactulose (CHRONULAC) 10 GM/15ML solution Take 30 mLs (20 g total) by mouth daily as needed for moderate constipation.  . [DISCONTINUED] levalbuterol (XOPENEX) 0.63 MG/3ML nebulizer solution Take 3 mLs (0.63 mg total) by nebulization every 2 (two) hours as needed for wheezing or shortness of breath.  . [DISCONTINUED] nystatin (MYCOSTATIN) 100000 UNIT/ML suspension Use as directed 5 mLs (500,000 Units total) in the mouth or throat 4 (four) times daily.  . [DISCONTINUED] Omega-3 Fatty Acids (FISH OIL) 1200 MG CAPS Take 1 capsule by mouth daily.    No facility-administered encounter medications on file as of 10/12/2014.      Review of Systems  Constitutional: Positive for fatigue.  HENT: Negative.   Eyes: Negative.   Respiratory: Positive for shortness of breath.   Cardiovascular: Negative.   Gastrointestinal: Negative.   Endocrine: Negative.   Genitourinary: Negative.   Musculoskeletal: Negative.   Skin: Negative.   Allergic/Immunologic:  Negative.   Neurological: Positive for weakness and light-headedness (at times).  Hematological: Negative.   Psychiatric/Behavioral: The patient is nervous/anxious (increased SOB = anxiety).        Objective:   Physical Exam  Constitutional: He is oriented to person, place, and time. No distress.  Patient was alert but pale in color. He was calm.  HENT:  Head: Normocephalic and atraumatic.  Nose: Nose normal.  Eyes: Conjunctivae and EOM are normal. Pupils are equal, round, and reactive to light. Right eye exhibits no discharge. Left eye exhibits no discharge. No scleral icterus.  Neck: Normal range of motion.  Cardiovascular: Normal heart sounds.   No murmur heard. The apical pulse was checked on several occasions ranging from 130 up to the 150 range and was irregular irregular  Pulmonary/Chest: Effort normal and breath sounds normal. No respiratory distress. He has no wheezes. He has no rales. He exhibits no tenderness.  There were no wheezes or rales present and breath sounds were fairly good bilaterally  Abdominal: Soft.  Bowel sounds are normal. He exhibits no mass. There is no tenderness. There is no rebound and no guarding.  Musculoskeletal: Normal range of motion. He exhibits no edema or tenderness.  The patient was able to stand and transfer himself from one chair to another but appears somewhat weak in the process.  Neurological: He is alert and oriented to person, place, and time.  Skin: Skin is warm and dry. No rash noted. No erythema. There is pallor.  Psychiatric: He has a normal mood and affect. His behavior is normal. Judgment and thought content normal.  Nursing note and vitals reviewed.  BP 110/62 mmHg  Pulse 135  Temp(Src) 97.3 F (36.3 C) (Oral)  Ht '5\' 8"'  (1.727 m)  Wt   SpO2 90%  Over 1 hour of time was spent evaluating the patient talking with his family members and reviewing journals where he had recorded his blood pressure readings pulses etc.        Assessment & Plan:  1. COPD with asthma -Patient seems to be stable with his breathing with minimal congestion. His pulse ox was 90. - POCT CBC; Future - BMP8+EGFR; Future - Hepatic function panel; Future  2. CLL (chronic lymphocytic leukemia) - POCT CBC; Future  3. ASCVD (arteriosclerotic cardiovascular disease) -Is having no chest pain just atrial fibrillation with rapid ventricular response - POCT CBC; Future  4. Hypertension -Blood pressure was 110/62 - POCT CBC; Future - BMP8+EGFR; Future - Hepatic function panel; Future  5. Hyperlipemia - POCT CBC; Future  6. Vitamin D deficiency  7. Atrial fibrillation persistent -The heart rate ranged from the 130s up to the 150s and it was checked on several occasions during the visit and it was irregular irregular. - POCT CBC; Future - BMP8+EGFR; Future - Hepatic function panel; Future  8. Acute respiratory failure, unspecified whether with hypoxia or hypercapnia -His respiratory failure issues seem to be stable at this point in time.  Patient Instructions                       Medicare Annual Wellness Visit  [facility] and the medical providers at [facility] strive to bring you the best medical care.  In doing so we not only want to address your current medical conditions and concerns but also to detect new conditions early and prevent illness, disease and health-related problems.    Medicare offers a yearly Wellness Visit which allows our clinical staff to assess your need for preventative services including immunizations, lifestyle education, counseling to decrease risk of preventable diseases and screening for fall risk and other medical concerns.    This visit is provided free of charge (no copay) for all Medicare recipients. The clinical pharmacists at [facility] have begun to conduct these Wellness Visits which will also include a thorough review of all your medications.     As you primary medical provider recommend that you make an appointment for your Annual Wellness Visit if you have not done so already this year.  You may set up this appointment before you leave today or you may call back ([phone]) and schedule an appointment.  Please make sure when you call that you mention that you are scheduling your Annual Wellness Visit with the clinical pharmacist so that the appointment may be made for the proper length of time.     Continue current medications. Continue good therapeutic lifestyle changes which include good diet and exercise. Fall precautions discussed  with patient. If an FOBT was given today- please return it to our front desk. If you are over 34 years old - you may need Prevnar 74 or the adult Pneumonia vaccine.   After your visit with [place] today you will receive a survey in the mail or online from [employer] regarding your care with [place]. Please take a moment to fill this out. Your feedback is very important to [place] as you can help [place] better understand your patient needs as well as improve your experience and satisfaction. WE CARE ABOUT YOU!!!   The patient will continue with his current treatment and I will call his cardiologist and discuss options for helping to lower his heart rate while keeping his blood pressure at a reasonable level He will continue with the brovana and budesonide at nighttime and he will try to use the Symbicort and Spiriva in the morning to see if this makes any difference with his breathing or not. He was told to resume the brovana and Pulmicort twice daily if trying the Symbicort with  spirva just in the morning did not help  The patient was encouraged to be as active around the house as he felt like he could be, which is in essence doing his on physical therapy to stay as strong as possible   Arrie Senate MD

## 2014-10-13 ENCOUNTER — Telehealth: Payer: Self-pay | Admitting: Family Medicine

## 2014-10-13 NOTE — Telephone Encounter (Signed)
I did speak with Johnathan Strong after speaking with his cardiologist and his recommendation was that we increase his cardiac enzyme 122 twice daily. The patient is aware of this and so is his caregiver.

## 2014-10-13 NOTE — Addendum Note (Signed)
Addended by: Chipper Herb on: 10/13/2014 08:09 AM   Modules accepted: Level of Service

## 2014-10-20 ENCOUNTER — Telehealth: Payer: Self-pay

## 2014-10-20 ENCOUNTER — Other Ambulatory Visit: Payer: Self-pay | Admitting: Family Medicine

## 2014-10-20 NOTE — Telephone Encounter (Signed)
Tried to call patient, no answer or voicemail available

## 2014-10-22 ENCOUNTER — Other Ambulatory Visit: Payer: Self-pay

## 2014-10-22 MED ORDER — DILTIAZEM HCL ER COATED BEADS 120 MG PO CP24: ORAL_CAPSULE | ORAL | Status: DC

## 2014-10-22 NOTE — Telephone Encounter (Signed)
Tried to call patient several times Sounds like someone picks up the phone but no one says anything, ? Phone working properly Will try again tomorrow

## 2014-10-23 ENCOUNTER — Other Ambulatory Visit: Payer: Self-pay | Admitting: *Deleted

## 2014-10-23 MED ORDER — DILTIAZEM HCL ER COATED BEADS 120 MG PO CP24: ORAL_CAPSULE | ORAL | Status: DC

## 2014-10-23 NOTE — Telephone Encounter (Signed)
Still unable to reach patient.

## 2014-10-27 ENCOUNTER — Other Ambulatory Visit (INDEPENDENT_AMBULATORY_CARE_PROVIDER_SITE_OTHER): Payer: Medicare Other

## 2014-10-27 ENCOUNTER — Telehealth: Payer: Self-pay | Admitting: *Deleted

## 2014-10-27 DIAGNOSIS — R32 Unspecified urinary incontinence: Secondary | ICD-10-CM

## 2014-10-27 LAB — POCT UA - MICROSCOPIC ONLY
Bacteria, U Microscopic: NEGATIVE
CASTS, UR, LPF, POC: NEGATIVE
Crystals, Ur, HPF, POC: NEGATIVE
MUCUS UA: NEGATIVE
RBC, urine, microscopic: NEGATIVE
WBC, UR, HPF, POC: NEGATIVE
Yeast, UA: NEGATIVE

## 2014-10-27 LAB — POCT URINALYSIS DIPSTICK
Bilirubin, UA: NEGATIVE
Blood, UA: NEGATIVE
GLUCOSE UA: NEGATIVE
KETONES UA: NEGATIVE
LEUKOCYTES UA: NEGATIVE
NITRITE UA: NEGATIVE
Protein, UA: NEGATIVE
Spec Grav, UA: 1.015
UROBILINOGEN UA: NEGATIVE
pH, UA: 6

## 2014-10-27 NOTE — Telephone Encounter (Signed)
Pt aware and he will have someone come by for a sample cup

## 2014-10-27 NOTE — Telephone Encounter (Signed)
As i called pt to discuss his wife's appointment  He stated that he has an issue: Urine stream is good, no burning or anything uncomfortable He cant control bladder - since he got home He seems to go too frequently and he cant control it.   He wonders about the meds for this

## 2014-10-27 NOTE — Addendum Note (Signed)
Addended by: Earlene Plater on: 10/27/2014 04:28 PM   Modules accepted: Orders

## 2014-10-27 NOTE — Telephone Encounter (Signed)
Check urinalysis if possible and then we will discuss options for treatment of this.

## 2014-10-27 NOTE — Progress Notes (Signed)
Lab only 

## 2014-10-29 ENCOUNTER — Other Ambulatory Visit: Payer: Self-pay | Admitting: Family Medicine

## 2014-10-30 MED ORDER — OXYBUTYNIN 3.9 MG/24HR TD PTTW: 1.0000 | MEDICATED_PATCH | TRANSDERMAL | Status: DC

## 2014-10-30 NOTE — Telephone Encounter (Signed)
See what is going on with this AP

## 2014-11-02 ENCOUNTER — Telehealth: Payer: Self-pay | Admitting: *Deleted

## 2014-11-02 MED ORDER — TOLTERODINE TARTRATE ER 2 MG PO CP24: 2.0000 mg | ORAL_CAPSULE | Freq: Every day | ORAL | Status: DC

## 2014-11-02 NOTE — Telephone Encounter (Signed)
The patient should try the lowest strength possible

## 2014-11-02 NOTE — Telephone Encounter (Signed)
I talked with pt last week about the bladder control options  He chose to try the patches and they ended up being VERY expensive.   Stew at the pharm reccommended Detrol LA - there is a free trial   Tammy, would you be ok with this? DWM, would you be ok with this?

## 2014-11-02 NOTE — Telephone Encounter (Signed)
Patient has requested Detrol LA because he can get for zero copy.  I think there should be no problem with started Detrol LA.  Rx sent for Detrol LA 2mg  take 1 capsule daily.

## 2014-11-03 ENCOUNTER — Other Ambulatory Visit: Payer: Self-pay | Admitting: *Deleted

## 2014-11-03 MED ORDER — METOPROLOL TARTRATE 50 MG PO TABS: 25.0000 mg | ORAL_TABLET | Freq: Two times a day (BID) | ORAL | Status: DC

## 2014-11-12 ENCOUNTER — Other Ambulatory Visit: Payer: Medicare Other

## 2014-11-12 DIAGNOSIS — R399 Unspecified symptoms and signs involving the genitourinary system: Secondary | ICD-10-CM

## 2014-11-12 NOTE — Progress Notes (Signed)
Lab only 

## 2014-11-13 ENCOUNTER — Telehealth: Payer: Self-pay | Admitting: Family Medicine

## 2014-11-14 LAB — URINE CULTURE

## 2014-11-16 ENCOUNTER — Other Ambulatory Visit: Payer: Self-pay | Admitting: *Deleted

## 2014-11-16 MED ORDER — DOXYCYCLINE HYCLATE 100 MG PO TABS
ORAL_TABLET | ORAL | Status: DC
Start: 2014-11-16 — End: 2014-11-25

## 2014-11-17 ENCOUNTER — Other Ambulatory Visit: Payer: Self-pay | Admitting: Family Medicine

## 2014-11-18 ENCOUNTER — Ambulatory Visit: Payer: Medicare Other | Admitting: Family Medicine

## 2014-11-18 ENCOUNTER — Other Ambulatory Visit: Payer: Self-pay | Admitting: Nurse Practitioner

## 2014-11-18 MED ORDER — TRAMADOL HCL 50 MG PO TABS: ORAL_TABLET | ORAL | Status: DC

## 2014-11-18 NOTE — Telephone Encounter (Signed)
Pt is getting his Tramadol through [facility], they delivered it last pm. Written Rx put in [facility]

## 2014-11-18 NOTE — Telephone Encounter (Signed)
This is okay to refill 

## 2014-11-18 NOTE — Telephone Encounter (Signed)
This is being filled through [facility] and delivered to their house yesterday. Written Rx put in [employer]

## 2014-11-18 NOTE — Telephone Encounter (Signed)
Last seen 10/12/14  DWM   If approved print

## 2014-11-19 ENCOUNTER — Ambulatory Visit: Payer: Medicare Other | Admitting: Family Medicine

## 2014-11-25 ENCOUNTER — Ambulatory Visit (INDEPENDENT_AMBULATORY_CARE_PROVIDER_SITE_OTHER): Payer: Medicare Other

## 2014-11-25 ENCOUNTER — Encounter: Payer: Self-pay | Admitting: Family Medicine

## 2014-11-25 ENCOUNTER — Ambulatory Visit (INDEPENDENT_AMBULATORY_CARE_PROVIDER_SITE_OTHER): Payer: Medicare Other | Admitting: Family Medicine

## 2014-11-25 VITALS — BP 112/62 | HR 51 | Temp 96.6°F | Ht 68.0 in | Wt 143.0 lb

## 2014-11-25 DIAGNOSIS — I2510 Atherosclerotic heart disease of native coronary artery without angina pectoris: Secondary | ICD-10-CM

## 2014-11-25 DIAGNOSIS — J9601 Acute respiratory failure with hypoxia: Secondary | ICD-10-CM

## 2014-11-25 DIAGNOSIS — I119 Hypertensive heart disease without heart failure: Secondary | ICD-10-CM

## 2014-11-25 DIAGNOSIS — N3946 Mixed incontinence: Secondary | ICD-10-CM

## 2014-11-25 DIAGNOSIS — C9110 Chronic lymphocytic leukemia of B-cell type not having achieved remission: Secondary | ICD-10-CM | POA: Diagnosis not present

## 2014-11-25 DIAGNOSIS — H543 Unqualified visual loss, both eyes: Secondary | ICD-10-CM

## 2014-11-25 DIAGNOSIS — J449 Chronic obstructive pulmonary disease, unspecified: Secondary | ICD-10-CM | POA: Diagnosis not present

## 2014-11-25 DIAGNOSIS — E291 Testicular hypofunction: Secondary | ICD-10-CM

## 2014-11-25 DIAGNOSIS — C679 Malignant neoplasm of bladder, unspecified: Secondary | ICD-10-CM | POA: Diagnosis not present

## 2014-11-25 DIAGNOSIS — I4891 Unspecified atrial fibrillation: Secondary | ICD-10-CM

## 2014-11-25 DIAGNOSIS — C9100 Acute lymphoblastic leukemia not having achieved remission: Secondary | ICD-10-CM | POA: Diagnosis not present

## 2014-11-25 DIAGNOSIS — H542 Low vision, both eyes: Secondary | ICD-10-CM

## 2014-11-25 DIAGNOSIS — I700 Atherosclerosis of aorta: Secondary | ICD-10-CM

## 2014-11-25 DIAGNOSIS — E559 Vitamin D deficiency, unspecified: Secondary | ICD-10-CM

## 2014-11-25 DIAGNOSIS — IMO0001 Reserved for inherently not codable concepts without codable children: Secondary | ICD-10-CM

## 2014-11-25 DIAGNOSIS — J9611 Chronic respiratory failure with hypoxia: Secondary | ICD-10-CM

## 2014-11-25 DIAGNOSIS — E785 Hyperlipidemia, unspecified: Secondary | ICD-10-CM | POA: Diagnosis not present

## 2014-11-25 DIAGNOSIS — E349 Endocrine disorder, unspecified: Secondary | ICD-10-CM

## 2014-11-25 MED ORDER — APIXABAN 5 MG PO TABS: 5.0000 mg | ORAL_TABLET | Freq: Two times a day (BID) | ORAL | Status: DC

## 2014-11-25 MED ORDER — APIXABAN 5 MG PO TABS: 5.0000 mg | ORAL_TABLET | Freq: Two times a day (BID) | ORAL | Status: AC

## 2014-11-25 NOTE — Progress Notes (Signed)
Subjective:    Patient ID: Johnathan Strong, male    DOB: 03/14/1923, 81 y.o.   MRN: [id number]  HPI Pt here for follow up and management of chronic medical problems which includes atrial fibrillation, hypertension and hyperlipidemia. He is taking medications regularly. Respiratory failure with hypoxia. The patient denies chest pain. His bowels are moving well. He has better control of his bladder with the Detrol. He does have a dry mouth. He is not seeing any blood in the stool or urine. He is alert today. Over 40 minutes of time were spent discussing all the issues involved with his home care, the expense, and how far he is come since he was discharged from the hospital to hospice care. The patient currently has a PA that comes in about twice a week and listens to his chest. He currently has 24-hour in-home care through different sitters who help with fixing his medicines and preparing his nebulizer treatments. He is alert enough that he thinks he can do some of this. I will need to make decisions about whether they want to cut back on the nighttime care and continue the daytime care after they leave this visit today.     Patient Active Problem List   Diagnosis Date Noted  . Acute respiratory failure   . Empyema   . Palliative care by specialist   . Dyspnea   . Respiratory failure   . Essential hypertension   . SOB (shortness of breath)   . Chest tube in place   . Pleural effusion associated with pulmonary infection   . Blood poisoning   . Pleural effusion   . Pleural effusion, right   . Sepsis 08/26/2014  . HCAP (healthcare-associated pneumonia) 08/25/2014  . Pleural effusion on right 08/25/2014  . CAP (community acquired pneumonia)   . Pneumonia 08/09/2014  . Paroxysmal atrial fibrillation 05/14/2014  . Atrial fibrillation 04/26/2014  . Atrial fibrillation with RVR 04/26/2014  . Acute respiratory failure with hypoxia 04/21/2014  . Bladder cancer 03/10/2014  . Testosterone  deficiency 06/24/2013  . ASCVD (arteriosclerotic cardiovascular disease) 06/24/2013  . Hyperlipemia 10/21/2012  . Unstable angina 09/27/2012  . CAD (coronary artery disease)   . DOE (dyspnea on exertion) 08/02/2012  . Dizziness 08/02/2012  . Chronic lymphatic leukemia 07/04/2012  . Hypertension 12/15/2010  . Chronic lymphocytic leukemia 12/15/2010  . COPD GOLD III 08/30/2010   Outpatient Encounter Prescriptions as of 11/25/2014  Medication Sig  . apixaban (ELIQUIS) 5 MG TABS tablet Take 1 tablet (5 mg total) by mouth 2 (two) times daily.  [name] arformoterol (BROVANA) 15 MCG/2ML NEBU Take 2 mLs (15 mcg total) by nebulization 2 (two) times daily.  . budesonide (PULMICORT) 0.5 MG/2ML nebulizer solution Take 2 mLs (0.5 mg total) by nebulization 2 (two) times daily.  . clopidogrel (PLAVIX) 75 MG tablet Take 1 tablet (75 mg total) by mouth daily. After decreasing dose of eliquis on 7/15  . diltiazem (CARDIZEM CD) 120 MG 24 hr capsule Take one daily by mouth BID as directed  . diphenhydramine-acetaminophen (TYLENOL PM) 25-500 MG TABS Take 1 tablet by mouth at bedtime as needed. sleep  . furosemide (LASIX) 20 MG tablet Take 20 mg by mouth 2 (two) times daily.  . metoprolol (LOPRESSOR) 50 MG tablet Take 0.5 tablets (25 mg total) by mouth 2 (two) times daily.  . montelukast (SINGULAIR) 10 MG tablet TAKE ONE TABLET AT BEDTIME  . predniSONE (DELTASONE) 20 MG tablet Take 20 mg by mouth daily with breakfast.  .  tolterodine (DETROL LA) 2 MG 24 hr capsule Take 1 capsule (2 mg total) by mouth daily.  . traMADol (ULTRAM) 50 MG tablet TAKE ONE TABLET BY MOUTH UP TO 2 TIMES A DAY.  . [DISCONTINUED] apixaban (ELIQUIS) 5 MG TABS tablet Take 1 tablet (5 mg total) by mouth 2 (two) times daily.  . [DISCONTINUED] apixaban (ELIQUIS) 5 MG TABS tablet Take 1 tablet (5 mg total) by mouth 2 (two) times daily.  . Morphine Sulfate (MORPHINE CONCENTRATE) 10 MG/0.5ML SOLN concentrated solution Take 0.25 mLs (5 mg total) by  mouth every 3 (three) hours as needed for anxiety or shortness of breath. (Patient not taking: Reported on 11/25/2014)  . nitroGLYCERIN (NITROSTAT) 0.4 MG SL tablet Place 1 tablet (0.4 mg total) under the tongue every 5 (five) minutes as needed for chest pain. (Patient not taking: Reported on 10/12/2014)  . [DISCONTINUED] doxycycline (VIBRA-TABS) 100 MG tablet Take 1 tablet twice daily with food for 14 days.  . [DISCONTINUED] furosemide (LASIX) 20 MG tablet Take 1 tablet (20 mg total) by mouth 3 (three) times daily as needed. (Patient taking differently: Take 20 mg by mouth 2 (two) times daily. )  . [DISCONTINUED] predniSONE (DELTASONE) 20 MG tablet TAKE (2) TABLETS BY MOUTH ONCE DAILY. (Patient taking differently: TAKE 1  TABLET BY MOUTH ONCE DAILY.)  . [DISCONTINUED] tiotropium (SPIRIVA HANDIHALER) 18 MCG inhalation capsule INHALE THE CONTENTS OF ONE CAPSULE ONCE DAILY AS DIRECTED. Hospice pt.   No facility-administered encounter medications on file as of 11/25/2014.       Review of Systems  Constitutional: Negative.   HENT: Negative.   Eyes:       Eye sight decreased  Respiratory: Negative.   Cardiovascular: Negative.   Gastrointestinal: Negative.   Endocrine: Negative.   Genitourinary: Negative.   Musculoskeletal: Negative.   Skin: Negative.   Allergic/Immunologic: Negative.   Neurological: Negative.   Hematological: Negative.   Psychiatric/Behavioral: Negative.        Objective:   Physical Exam  Constitutional: He is oriented to person, place, and time. No distress.  Elderly appearing but pleasant and alert and more like his usual self before this severe medical crisis occurred with his breathing.  HENT:  Head: Normocephalic and atraumatic.  Right Ear: External ear normal.  Left Ear: External ear normal.  Nose: Nose normal.  Mouth/Throat: Oropharynx is clear and moist. No oropharyngeal exudate.  Eyes: Conjunctivae and EOM are normal. Pupils are equal, round, and reactive to  light. Right eye exhibits no discharge. Left eye exhibits no discharge. No scleral icterus.  Neck: Normal range of motion. Neck supple. No thyromegaly present.  Without adenopathy or thyromegaly  Cardiovascular: Normal rate and normal heart sounds.   No murmur heard. The heart is irregular irregular at anywhere from 84-96/m.  Pulmonary/Chest: Effort normal. No respiratory distress. He has no wheezes. He has no rales. He exhibits no tenderness.  Decrease breath sounds bilaterally with minimal basal congestion in the right chest posteriorly  Abdominal: Soft. Bowel sounds are normal. He exhibits distension. He exhibits no mass. There is no tenderness. There is no rebound and no guarding.  The abdomen has some gas but no tenderness or masses  Musculoskeletal: Normal range of motion. He exhibits no edema or tenderness.  The patient appears to have fairly good strength and is wearing his nasal oxygen and walking without assistance  Lymphadenopathy:    He has no cervical adenopathy.  Neurological: He is alert and oriented to person, place, and time.  Skin:  Skin is warm and dry. No rash noted.  Psychiatric: He has a normal mood and affect. His behavior is normal. Judgment and thought content normal.  Nursing note and vitals reviewed.   BP 112/62 mmHg  Pulse 51  Temp(Src) 96.6 F (35.9 C) (Oral)  Ht _0  (1.727 m)  Wt 143 lb (64.864 kg)  BMI 21.75 kg/m2  [facility] reading (PRIMARY) by  Dr.Moore-chest x-ray- chest x-ray still has some right basilar congestion but apparently much improved from previous x-rays.                                      Assessment & Plan:  1. CLL (chronic lymphocytic leukemia) - CBC with Differential/Platelet  2. ASCVD (arteriosclerotic cardiovascular disease) -The patient has had no chest pain. He is doing better with the diltiazem twice daily - CBC with Differential/Platelet - DG Chest 2 View; Future  3. Hypertension -The blood pressure is good today. -  BMP8+EGFR - Hepatic function panel - CBC with Differential/Platelet - DG Chest 2 View; Future  4. Hyperlipemia -No treatment of this currently but we will continue to monitor this and restart medication if numbers are very elevated. - Lipid panel - CBC with Differential/Platelet  5. Vitamin D deficiency -We will check a vitamin D level today and make sure that his renal function is good before restarting this. - CBC with Differential/Platelet - Vit D  25 hydroxy (rtn osteoporosis monitoring)  6. Atrial fibrillation persistent -The patient continues to be in atrial fibrillation but is more rate controlled than previously today and this was between 84 and 96/m. On the whole visit the rate was much more rapid - CBC with Differential/Platelet - DG Chest 2 View; Future  7. Testosterone deficiency -We will defer further testosterone treatment at this time - CBC with Differential/Platelet  8. Acute respiratory failure with hypoxia -The patient continues to use his oxygen and he does check pulse ox is at home and we will not change this. - CBC with Differential/Platelet - DG Chest 2 View; Future  9. Chronic lymphocytic leukemia -We will check his white blood cell count today make sure there's been no significant change from the past. - CBC with Differential/Platelet  10. COPD with asthma -He will continue with his current breathing treatments and respiratory precautions - DG Chest 2 View; Future  11. Malignant neoplasm of urinary bladder, unspecified site -We may need to address follow-up with the urologist but because he still recovering from this crisis this summer we will defer this at this time.  12. Decreased vision in both eyes -We will arrange a visit for further evaluation in the future - Ambulatory referral to Ophthalmology  13. Chronic respiratory failure with hypoxia -We'll continue with his current breathing treatments and oxygen use  14. Thoracic aortic  atherosclerosis -He will continue to watch his diet closely and keep his cholesterol down with therapeutic lifestyle changes  15. Mixed incontinence -He is doing better with his incontinence and we will continue with the Detrol  Meds ordered this encounter  Medications  . furosemide (LASIX) 20 MG tablet    Sig: Take 20 mg by mouth 2 (two) times daily.  . predniSONE (DELTASONE) 20 MG tablet    Sig: Take 20 mg by mouth daily with breakfast.  . DISCONTD: apixaban (ELIQUIS) 5 MG TABS tablet    Sig: Take 1 tablet (5 mg total) by mouth 2 (  two) times daily.    Dispense:  180 tablet    Refill:  3  . apixaban (ELIQUIS) 5 MG TABS tablet    Sig: Take 1 tablet (5 mg total) by mouth 2 (two) times daily.    Dispense:  180 tablet    Refill:  3   Patient Instructions                       Medicare Annual Wellness Visit  [facility] and the medical providers at [facility] strive to bring you the best medical care.  In doing so we not only want to address your current medical conditions and concerns but also to detect new conditions early and prevent illness, disease and health-related problems.    Medicare offers a yearly Wellness Visit which allows our clinical staff to assess your need for preventative services including immunizations, lifestyle education, counseling to decrease risk of preventable diseases and screening for fall risk and other medical concerns.    This visit is provided free of charge (no copay) for all Medicare recipients. The clinical pharmacists at [facility] have begun to conduct these Wellness Visits which will also include a thorough review of all your medications.    As you primary medical provider recommend that you make an appointment for your Annual Wellness Visit if you have not done so already this year.  You may set up this appointment before you leave today or you may call back ([phone]) and schedule an appointment.   Please make sure when you call that you mention that you are scheduling your Annual Wellness Visit with the clinical pharmacist so that the appointment may be made for the proper length of time.     Continue current medications. Continue good therapeutic lifestyle changes which include good diet and exercise. Fall precautions discussed with patient. If an FOBT was given today- please return it to our front desk. If you are over 57 years old - you may need Prevnar 40 or the adult Pneumonia vaccine.  **Flu shots will be available soon--- please call and schedule a FLU-CLINIC appointment**  After your visit with [place] today you will receive a survey in the mail or online from [employer] regarding your care with [place]. Please take a moment to fill this out. Your feedback is very important to [place] as you can help [place] better understand your patient needs as well as improve your experience and satisfaction. WE CARE ABOUT YOU!!!   **Please join [place] SEPT.[email or web address], 2016 from 5:00 to 7:00pm for our OPEN HOUSE! Come out and meet our NEW providers**   STOP the Tylenol PM or any Benadryl You can alternate 20 mg and 10 mg daily on Prednisone for the next 6 weeks, if you get any more short of breathe - go back on 20 mg daily. Hold the Testosterone Try 1 ensure daily (maybe 1/2 in AM and 1/2 in PM) We will schedule appt with [facility] eye associates in [place].  Try Biotene mouthwash / mouth spray for Dry Mouth You may decrease in home caregivers to only during the day / wake hours if you feel comfortable.  Consider using a Lifeline and we will give you a brochure to order that Continue Detrol LA Always drink plenty of fluids Continue with hospice care       Arrie Senate MD

## 2014-11-25 NOTE — Patient Instructions (Addendum)
Medicare Annual Wellness Visit  [facility] and the medical providers at [facility] strive to bring you the best medical care.  In doing so we not only want to address your current medical conditions and concerns but also to detect new conditions early and prevent illness, disease and health-related problems.    Medicare offers a yearly Wellness Visit which allows our clinical staff to assess your need for preventative services including immunizations, lifestyle education, counseling to decrease risk of preventable diseases and screening for fall risk and other medical concerns.    This visit is provided free of charge (no copay) for all Medicare recipients. The clinical pharmacists at [facility] have begun to conduct these Wellness Visits which will also include a thorough review of all your medications.    As you primary medical provider recommend that you make an appointment for your Annual Wellness Visit if you have not done so already this year.  You may set up this appointment before you leave today or you may call back ([phone]) and schedule an appointment.  Please make sure when you call that you mention that you are scheduling your Annual Wellness Visit with the clinical pharmacist so that the appointment may be made for the proper length of time.     Continue current medications. Continue good therapeutic lifestyle changes which include good diet and exercise. Fall precautions discussed with patient. If an FOBT was given today- please return it to our front desk. If you are over 22 years old - you may need Prevnar 15 or the adult Pneumonia vaccine.  **Flu shots will be available soon--- please call and schedule a FLU-CLINIC appointment**  After your visit with [place] today you will receive a survey in the mail or online from [employer] regarding your care with [place]. Please take a moment to fill this out. Your feedback is  very important to [place] as you can help [place] better understand your patient needs as well as improve your experience and satisfaction. WE CARE ABOUT YOU!!!   **Please join [place] SEPT.[email or web address], 2016 from 5:00 to 7:00pm for our OPEN HOUSE! Come out and meet our NEW providers**   STOP the Tylenol PM or any Benadryl You can alternate 20 mg and 10 mg daily on Prednisone for the next 6 weeks, if you get any more short of breathe - go back on 20 mg daily. Hold the Testosterone Try 1 ensure daily (maybe 1/2 in AM and 1/2 in PM) We will schedule appt with [facility] eye associates in [place].  Try Biotene mouthwash / mouth spray for Dry Mouth You may decrease in home caregivers to only during the day / wake hours if you feel comfortable.  Consider using a Lifeline and we will give you a brochure to order that Continue Detrol LA Always drink plenty of fluids Continue with hospice care

## 2014-11-26 ENCOUNTER — Other Ambulatory Visit: Payer: Self-pay | Admitting: Family Medicine

## 2014-11-27 LAB — CBC WITH DIFFERENTIAL/PLATELET
Basophils Absolute: 0 10*3/uL (ref 0.0–0.2)
Basos: 0 %
EOS (ABSOLUTE): 0 10*3/uL (ref 0.0–0.4)
Eos: 0 %
Hematocrit: 34.1 % — ABNORMAL LOW (ref 37.5–51.0)
Hemoglobin: 10.6 g/dL — ABNORMAL LOW (ref 12.6–17.7)
IMMATURE GRANS (ABS): 0.3 10*3/uL — AB (ref 0.0–0.1)
IMMATURE GRANULOCYTES: 2 %
LYMPHS: 48 %
Lymphocytes Absolute: 8.4 10*3/uL — ABNORMAL HIGH (ref 0.7–3.1)
MCH: 29.4 pg (ref 26.6–33.0)
MCHC: 31.1 g/dL — ABNORMAL LOW (ref 31.5–35.7)
MCV: 95 fL (ref 79–97)
Monocytes Absolute: 0.4 10*3/uL (ref 0.1–0.9)
Monocytes: 2 %
Neutrophils Absolute: 8.6 10*3/uL — ABNORMAL HIGH (ref 1.4–7.0)
Neutrophils: 48 %
PLATELETS: 283 10*3/uL (ref 150–379)
RBC: 3.61 x10E6/uL — ABNORMAL LOW (ref 4.14–5.80)
RDW: 23.5 % — ABNORMAL HIGH (ref 12.3–15.4)
WBC: 17.5 10*3/uL — ABNORMAL HIGH (ref 3.4–10.8)

## 2014-11-27 LAB — VITAMIN D 25 HYDROXY (VIT D DEFICIENCY, FRACTURES): VIT D 25 HYDROXY: 27.7 ng/mL — AB (ref 30.0–100.0)

## 2014-11-27 LAB — BMP8+EGFR
BUN / CREAT RATIO: 24 — AB (ref 10–22)
BUN: 35 mg/dL — AB (ref 8–27)
CO2: 25 mmol/L (ref 18–29)
CREATININE: 1.47 mg/dL — AB (ref 0.76–1.27)
Calcium: 9.2 mg/dL (ref 8.6–10.2)
Chloride: 95 mmol/L — ABNORMAL LOW (ref 97–108)
GFR calc Af Amer: 48 mL/min/{1.73_m2} — ABNORMAL LOW (ref >59)
GFR calc non Af Amer: 42 mL/min/{1.73_m2} — ABNORMAL LOW (ref >59)
Glucose: 128 mg/dL — ABNORMAL HIGH (ref 65–99)
Potassium: 5.5 mmol/L — ABNORMAL HIGH (ref 3.5–5.2)
Sodium: 138 mmol/L (ref 134–144)

## 2014-11-27 LAB — LIPID PANEL
Chol/HDL Ratio: 2.5 ratio units (ref 0.0–5.0)
Cholesterol, Total: 179 mg/dL (ref 100–199)
HDL: 73 mg/dL (ref >39)
LDL CALC: 89 mg/dL (ref 0–99)
Triglycerides: 87 mg/dL (ref 0–149)
VLDL Cholesterol Cal: 17 mg/dL (ref 5–40)

## 2014-11-27 LAB — HEPATIC FUNCTION PANEL
ALT: 83 IU/L — ABNORMAL HIGH (ref 0–44)
AST: 35 IU/L (ref 0–40)
Albumin: 4.4 g/dL (ref 3.5–4.7)
Alkaline Phosphatase: 53 IU/L (ref 39–117)
BILIRUBIN, DIRECT: 0.16 mg/dL (ref 0.00–0.40)
Bilirubin Total: 0.4 mg/dL (ref 0.0–1.2)
TOTAL PROTEIN: 6 g/dL (ref 6.0–8.5)

## 2014-12-01 ENCOUNTER — Other Ambulatory Visit: Payer: Self-pay | Admitting: Family Medicine

## 2014-12-07 ENCOUNTER — Other Ambulatory Visit (INDEPENDENT_AMBULATORY_CARE_PROVIDER_SITE_OTHER): Payer: Medicare Other

## 2014-12-07 ENCOUNTER — Ambulatory Visit (INDEPENDENT_AMBULATORY_CARE_PROVIDER_SITE_OTHER): Payer: Medicare Other | Admitting: *Deleted

## 2014-12-07 DIAGNOSIS — Z23 Encounter for immunization: Secondary | ICD-10-CM | POA: Diagnosis not present

## 2014-12-07 DIAGNOSIS — R799 Abnormal finding of blood chemistry, unspecified: Secondary | ICD-10-CM | POA: Diagnosis not present

## 2014-12-09 LAB — BMP8+EGFR
BUN / CREAT RATIO: 24 — AB (ref 10–22)
BUN: 28 mg/dL — ABNORMAL HIGH (ref 8–27)
CO2: 26 mmol/L (ref 18–29)
CREATININE: 1.18 mg/dL (ref 0.76–1.27)
Calcium: 9.3 mg/dL (ref 8.6–10.2)
Chloride: 96 mmol/L — ABNORMAL LOW (ref 97–108)
GFR calc Af Amer: 63 mL/min/{1.73_m2} (ref >59)
GFR calc non Af Amer: 54 mL/min/{1.73_m2} — ABNORMAL LOW (ref >59)
GLUCOSE: 113 mg/dL — AB (ref 65–99)
Potassium: 4.8 mmol/L (ref 3.5–5.2)
SODIUM: 140 mmol/L (ref 134–144)

## 2014-12-15 ENCOUNTER — Other Ambulatory Visit: Payer: Self-pay | Admitting: Family Medicine

## 2014-12-17 ENCOUNTER — Other Ambulatory Visit: Payer: Self-pay | Admitting: *Deleted

## 2014-12-17 MED ORDER — TRAMADOL HCL 50 MG PO TABS: ORAL_TABLET | ORAL | Status: DC

## 2014-12-17 MED ORDER — ARFORMOTEROL TARTRATE 15 MCG/2ML IN NEBU: 15.0000 ug | INHALATION_SOLUTION | Freq: Two times a day (BID) | RESPIRATORY_TRACT | Status: DC

## 2014-12-30 DIAGNOSIS — Z0289 Encounter for other administrative examinations: Secondary | ICD-10-CM

## 2014-12-31 ENCOUNTER — Other Ambulatory Visit: Payer: Self-pay | Admitting: Family Medicine

## 2015-01-05 ENCOUNTER — Other Ambulatory Visit: Payer: Self-pay | Admitting: Family Medicine

## 2015-01-06 ENCOUNTER — Other Ambulatory Visit: Payer: Self-pay | Admitting: *Deleted

## 2015-01-06 DIAGNOSIS — R918 Other nonspecific abnormal finding of lung field: Secondary | ICD-10-CM

## 2015-01-06 DIAGNOSIS — R911 Solitary pulmonary nodule: Secondary | ICD-10-CM

## 2015-01-06 NOTE — Telephone Encounter (Signed)
Last seen 11/25/14  DWM  If approved print

## 2015-01-06 NOTE — Telephone Encounter (Signed)
This is okay to refill 

## 2015-01-15 ENCOUNTER — Other Ambulatory Visit: Payer: Self-pay | Admitting: *Deleted

## 2015-01-15 MED ORDER — ARFORMOTEROL TARTRATE 15 MCG/2ML IN NEBU: 15.0000 ug | INHALATION_SOLUTION | Freq: Two times a day (BID) | RESPIRATORY_TRACT | Status: AC

## 2015-01-19 ENCOUNTER — Other Ambulatory Visit: Payer: Self-pay | Admitting: Family Medicine

## 2015-01-24 ENCOUNTER — Emergency Department (HOSPITAL_COMMUNITY): Payer: Medicare Other

## 2015-01-24 ENCOUNTER — Encounter (HOSPITAL_COMMUNITY): Payer: Self-pay | Admitting: Emergency Medicine

## 2015-01-24 ENCOUNTER — Inpatient Hospital Stay (HOSPITAL_COMMUNITY)
Admission: EM | Admit: 2015-01-24 | Discharge: 2015-01-26 | DRG: 186 | Disposition: A | Discharge status: Hospice | Payer: Medicare Other | Attending: Internal Medicine | Admitting: Internal Medicine

## 2015-01-24 DIAGNOSIS — G8929 Other chronic pain: Secondary | ICD-10-CM | POA: Diagnosis present

## 2015-01-24 DIAGNOSIS — Z9842 Cataract extraction status, left eye: Secondary | ICD-10-CM

## 2015-01-24 DIAGNOSIS — C91Z0 Other lymphoid leukemia not having achieved remission: Secondary | ICD-10-CM | POA: Diagnosis not present

## 2015-01-24 DIAGNOSIS — Z9841 Cataract extraction status, right eye: Secondary | ICD-10-CM | POA: Diagnosis not present

## 2015-01-24 DIAGNOSIS — Z9981 Dependence on supplemental oxygen: Secondary | ICD-10-CM

## 2015-01-24 DIAGNOSIS — Z955 Presence of coronary angioplasty implant and graft: Secondary | ICD-10-CM

## 2015-01-24 DIAGNOSIS — J90 Pleural effusion, not elsewhere classified: Principal | ICD-10-CM | POA: Diagnosis present

## 2015-01-24 DIAGNOSIS — Z886 Allergy status to analgesic agent status: Secondary | ICD-10-CM

## 2015-01-24 DIAGNOSIS — Z66 Do not resuscitate: Secondary | ICD-10-CM | POA: Diagnosis present

## 2015-01-24 DIAGNOSIS — E785 Hyperlipidemia, unspecified: Secondary | ICD-10-CM | POA: Diagnosis present

## 2015-01-24 DIAGNOSIS — Z961 Presence of intraocular lens: Secondary | ICD-10-CM | POA: Diagnosis present

## 2015-01-24 DIAGNOSIS — Z8673 Personal history of transient ischemic attack (TIA), and cerebral infarction without residual deficits: Secondary | ICD-10-CM | POA: Diagnosis not present

## 2015-01-24 DIAGNOSIS — Z8551 Personal history of malignant neoplasm of bladder: Secondary | ICD-10-CM | POA: Diagnosis not present

## 2015-01-24 DIAGNOSIS — J9620 Acute and chronic respiratory failure, unspecified whether with hypoxia or hypercapnia: Secondary | ICD-10-CM | POA: Diagnosis present

## 2015-01-24 DIAGNOSIS — M8580 Other specified disorders of bone density and structure, unspecified site: Secondary | ICD-10-CM | POA: Diagnosis present

## 2015-01-24 DIAGNOSIS — J948 Other specified pleural conditions: Secondary | ICD-10-CM | POA: Diagnosis present

## 2015-01-24 DIAGNOSIS — J449 Chronic obstructive pulmonary disease, unspecified: Secondary | ICD-10-CM | POA: Diagnosis present

## 2015-01-24 DIAGNOSIS — I4891 Unspecified atrial fibrillation: Secondary | ICD-10-CM

## 2015-01-24 DIAGNOSIS — N400 Enlarged prostate without lower urinary tract symptoms: Secondary | ICD-10-CM | POA: Diagnosis present

## 2015-01-24 DIAGNOSIS — J9611 Chronic respiratory failure with hypoxia: Secondary | ICD-10-CM

## 2015-01-24 DIAGNOSIS — I10 Essential (primary) hypertension: Secondary | ICD-10-CM | POA: Diagnosis present

## 2015-01-24 DIAGNOSIS — J438 Other emphysema: Secondary | ICD-10-CM

## 2015-01-24 DIAGNOSIS — G454 Transient global amnesia: Secondary | ICD-10-CM | POA: Diagnosis present

## 2015-01-24 DIAGNOSIS — C9110 Chronic lymphocytic leukemia of B-cell type not having achieved remission: Secondary | ICD-10-CM | POA: Diagnosis present

## 2015-01-24 DIAGNOSIS — M549 Dorsalgia, unspecified: Secondary | ICD-10-CM | POA: Diagnosis present

## 2015-01-24 DIAGNOSIS — I2510 Atherosclerotic heart disease of native coronary artery without angina pectoris: Secondary | ICD-10-CM | POA: Diagnosis present

## 2015-01-24 DIAGNOSIS — I482 Chronic atrial fibrillation: Secondary | ICD-10-CM | POA: Diagnosis present

## 2015-01-24 DIAGNOSIS — Z87891 Personal history of nicotine dependence: Secondary | ICD-10-CM

## 2015-01-24 DIAGNOSIS — R079 Chest pain, unspecified: Secondary | ICD-10-CM | POA: Insufficient documentation

## 2015-01-24 DIAGNOSIS — IMO0001 Reserved for inherently not codable concepts without codable children: Secondary | ICD-10-CM | POA: Diagnosis present

## 2015-01-24 DIAGNOSIS — R0781 Pleurodynia: Secondary | ICD-10-CM | POA: Diagnosis not present

## 2015-01-24 DIAGNOSIS — J9610 Chronic respiratory failure, unspecified whether with hypoxia or hypercapnia: Secondary | ICD-10-CM | POA: Diagnosis present

## 2015-01-24 LAB — CBC WITH DIFFERENTIAL/PLATELET
BASOS PCT: 0 %
Basophils Absolute: 0 10*3/uL (ref 0.0–0.1)
EOS ABS: 0 10*3/uL (ref 0.0–0.7)
EOS PCT: 0 %
HCT: 31 % — ABNORMAL LOW (ref 39.0–52.0)
Hemoglobin: 9.5 g/dL — ABNORMAL LOW (ref 13.0–17.0)
LYMPHS ABS: 3.2 10*3/uL (ref 0.7–4.0)
Lymphocytes Relative: 24 %
MCH: 29 pg (ref 26.0–34.0)
MCHC: 30.6 g/dL (ref 30.0–36.0)
MCV: 94.5 fL (ref 78.0–100.0)
MONO ABS: 1.1 10*3/uL — AB (ref 0.1–1.0)
Monocytes Relative: 8 %
Neutro Abs: 9.2 10*3/uL — ABNORMAL HIGH (ref 1.7–7.7)
Neutrophils Relative %: 68 %
PLATELETS: 200 10*3/uL (ref 150–400)
RBC: 3.28 MIL/uL — AB (ref 4.22–5.81)
RDW: 22.3 % — AB (ref 11.5–15.5)
WBC: 13.5 10*3/uL — AB (ref 4.0–10.5)

## 2015-01-24 LAB — BASIC METABOLIC PANEL
Anion gap: 8 (ref 5–15)
BUN: 27 mg/dL — AB (ref 6–20)
CALCIUM: 9.1 mg/dL (ref 8.9–10.3)
CO2: 33 mmol/L — AB (ref 22–32)
CREATININE: 1.24 mg/dL (ref 0.61–1.24)
Chloride: 99 mmol/L — ABNORMAL LOW (ref 101–111)
GFR calc Af Amer: 58 mL/min — ABNORMAL LOW (ref >60)
GFR calc non Af Amer: 50 mL/min — ABNORMAL LOW (ref >60)
GLUCOSE: 121 mg/dL — AB (ref 65–99)
Potassium: 4 mmol/L (ref 3.5–5.1)
Sodium: 140 mmol/L (ref 135–145)

## 2015-01-24 LAB — I-STAT TROPONIN, ED: Troponin i, poc: 0.01 ng/mL (ref 0.00–0.08)

## 2015-01-24 LAB — PROTIME-INR
INR: 1.68 — ABNORMAL HIGH (ref 0.00–1.49)
Prothrombin Time: 19.8 seconds — ABNORMAL HIGH (ref 11.6–15.2)

## 2015-01-24 LAB — TROPONIN I

## 2015-01-24 MED ORDER — MORPHINE SULFATE (CONCENTRATE) 10 MG/0.5ML PO SOLN: 5.0000 mg | ORAL | Status: DC | PRN

## 2015-01-24 MED ORDER — DILTIAZEM HCL 25 MG/5ML IV SOLN
15.0000 mg | Freq: Once | INTRAVENOUS | Status: AC
  Administered 2015-01-24: 15 mg via INTRAVENOUS
  Filled 2015-01-24: qty 5

## 2015-01-24 MED ORDER — ARFORMOTEROL TARTRATE 15 MCG/2ML IN NEBU
15.0000 ug | INHALATION_SOLUTION | Freq: Two times a day (BID) | RESPIRATORY_TRACT | Status: DC
  Administered 2015-01-24 – 2015-01-26 (×4): 15 ug via RESPIRATORY_TRACT
  Filled 2015-01-24 (×4): qty 2

## 2015-01-24 MED ORDER — DILTIAZEM HCL 100 MG IV SOLR
5.0000 mg/h | INTRAVENOUS | Status: DC
  Administered 2015-01-24: 15 mg/h via INTRAVENOUS
  Administered 2015-01-24: 5 mg/h via INTRAVENOUS
  Administered 2015-01-24 – 2015-01-25 (×2): 15 mg/h via INTRAVENOUS
  Filled 2015-01-24 (×5): qty 100

## 2015-01-24 MED ORDER — PREDNISONE 10 MG PO TABS
10.0000 mg | ORAL_TABLET | Freq: Every day | ORAL | Status: DC
  Administered 2015-01-25 – 2015-01-26 (×2): 10 mg via ORAL
  Filled 2015-01-24 (×2): qty 1

## 2015-01-24 MED ORDER — DILTIAZEM LOAD VIA INFUSION
15.0000 mg | Freq: Once | INTRAVENOUS | Status: DC
Start: 2015-01-24 — End: 2015-01-24
  Filled 2015-01-24: qty 15

## 2015-01-24 MED ORDER — FESOTERODINE FUMARATE ER 4 MG PO TB24
4.0000 mg | ORAL_TABLET | Freq: Every day | ORAL | Status: DC
  Administered 2015-01-24 – 2015-01-26 (×3): 4 mg via ORAL
  Filled 2015-01-24 (×3): qty 1

## 2015-01-24 MED ORDER — FUROSEMIDE 20 MG PO TABS
20.0000 mg | ORAL_TABLET | Freq: Two times a day (BID) | ORAL | Status: DC
  Administered 2015-01-24 – 2015-01-25 (×2): 20 mg via ORAL
  Filled 2015-01-24 (×2): qty 1

## 2015-01-24 MED ORDER — POLYETHYLENE GLYCOL 3350 17 G PO PACK: 17.0000 g | PACK | Freq: Every day | ORAL | Status: DC | PRN

## 2015-01-24 MED ORDER — TRAMADOL HCL 50 MG PO TABS
50.0000 mg | ORAL_TABLET | Freq: Two times a day (BID) | ORAL | Status: DC | PRN
  Administered 2015-01-26: 50 mg via ORAL
  Filled 2015-01-24: qty 1

## 2015-01-24 MED ORDER — SODIUM CHLORIDE 0.9 % IJ SOLN
3.0000 mL | Freq: Two times a day (BID) | INTRAMUSCULAR | Status: DC
Start: 2015-01-24 — End: 2015-01-26
  Administered 2015-01-24 – 2015-01-26 (×3): 3 mL via INTRAVENOUS

## 2015-01-24 MED ORDER — BUDESONIDE 0.5 MG/2ML IN SUSP
0.5000 mg | Freq: Two times a day (BID) | RESPIRATORY_TRACT | Status: DC
  Administered 2015-01-24 – 2015-01-26 (×4): 0.5 mg via RESPIRATORY_TRACT
  Filled 2015-01-24 (×4): qty 2

## 2015-01-24 NOTE — ED Notes (Signed)
Patient comes in today with complaints of Chest pain. Patient denies any radiation. Patient  Has some LLE extremity swelling. Patient able to move all extremities. Patient unable to rate pain  States " I have little pain when i breathe deeply. Patient in Afib HX and on Plavix . Patient Alert and Orinted x4 able to move all extremities.

## 2015-01-24 NOTE — ED Provider Notes (Signed)
CSN: [id number]     Arrival date & time 01/24/15  [id number] History   First MD Initiated Contact with Patient 01/24/15 0830     Chief Complaint  Patient presents with  . Chest Pain     (Consider location/radiation/quality/duration/timing/severity/associated sxs/prior Treatment) The history is provided by the patient, the spouse, a relative and the EMS personnel. No language interpreter was used.   [name] is an 81 year old male with a history of hyperlipidemia, COPD, A. fib on Plavix and eliquis, dementia, chronic back pain, hypertension, asthma, CLL, cardiac stent, congestive diastolic heart failure, and chronic respiratory failure with hypoxia, who presents with chest pain that began early this morning. He denies any radiation of the pain. When I asked him what his pain level was on a scale of  1-10 with 10 being the worst, he replied, "well, it is not much pain going on now." He states that he took a dissolvable morphine for his pain prior to arrival. He is on oxygen at home but EMS reports that the tubing is about 20 feet long from the oxygen tank. He does not know how many liters she normally wears. EMS reports that when they arrived he was lying flat in bed and in no acute respiratory distress. He is on 20 mg of Lasix. He denies walking with any assistive devices at home. He lives with his wife. EMS reports that he is in hospice care. He states that he does not know when he is in A. fib.  Level V caveat.    Past Medical History  Diagnosis Date  . Osteopenia 2006  . Hyperlipidemia   . BPH (benign prostatic hypertrophy)   . Transient global amnesia   . Osteoarthritis   . History of TIA (transient ischemic attack) 1980'S    NO RESIDUAL  . COPD (chronic obstructive pulmonary disease) with emphysema ([place])   . Chronic back pain   . Itching SECONDARY TO CLL-- CONTROLLED W/ SINGULAIR  . Frequency   . Nocturia   . Hypertension CARDIOLOGIST- DR BRACKBILL-- LAST VISIT 12-15-2010 NOTE IN EPIC   . CLL (chronic lymphocytic leukemia) (HCC) LAST PLT COUNT APRL 2012  168    Managed at [facility]  . Bladder cancer ([place])   . Aspirin allergy     Eyes swell  . Asthma   . Dyspnea on exertion   . CAD (coronary artery disease)     a. 08/2012 Cath: LM nl, LAD 90p, LCX 20-30, RCA 100ost fills via L->R collats, EF 55-65%;  b. 09/2012 PCI of prox LAD with 3.0x16 Promus DES.   Past Surgical History  Procedure Laterality Date  . Excisional hemorrhoidectomy  1980  . Cardiovascular stress test  07/2005    a. 07/2005- no reversible ischemia, normal EF b. 06/2009- EF 73%, no reversible ischemia, inferolateral TWIs at rest, upright in recovery c. 08/2012- mediuim-sized partially reversible basal to mid inferior and inferoseptal perfusion defect c/w with prior infarction and peri-infarct ischemia; EF 68% and borderline ST/T changes on stress ECG   . Transurethral resection of bladder tumor  10-19-2008    AND DILATION URETHRAL STRICTURE  . Cataract extraction w/ intraocular lens  implant, bilateral Bilateral ~ 2007  . Transthoracic echocardiogram  01-03-2011    LVEF [id number], grade 1 diastolic dysfunction, no WMAs or structural abnormalities  . Cystoscopy with biopsy  10/30/2011    Procedure: CYSTOSCOPY WITH BIOPSY;  Surgeon: Claybon Jabs, MD;  Location: [facility];  Service: Urology;  Laterality: N/A;  . Cardiac catheterization  08/27/2012  . Coronary angioplasty with stent placement  09/26/2012    "1" (09/26/2012)  . Coronary stent placement  09/26/12  . Left heart catheterization with coronary angiogram N/A 08/27/2012    Procedure: LEFT HEART CATHETERIZATION WITH CORONARY ANGIOGRAM;  Surgeon: Peter M Martinique, MD;  Location: [facility] CATH LAB;  Service: Cardiovascular;  Laterality: N/A;  . Percutaneous coronary stent intervention (pci-s) N/A 09/26/2012    Procedure: PERCUTANEOUS CORONARY STENT INTERVENTION (PCI-S);  Surgeon: Peter M Martinique, MD;  Location: [facility] CATH LAB;  Service:  Cardiovascular;  Laterality: N/A;   Family History  Problem Relation Age of Onset  . Emphysema Father   . Heart disease Father   . COPD Father   . Atrial fibrillation Father   . Kidney cancer Mother   . Kidney failure Mother    Social History  Substance Use Topics  . Smoking status: Former Smoker -- 1.00 packs/day for 25 years    Types: Cigarettes    Quit date: 03/06/1960  . Smokeless tobacco: Former Systems developer    Quit date: 10/25/1978  . Alcohol Use: 0.0 oz/week    0 Standard drinks or equivalent per week     Comment: 09/26/2012 "glass of wine or mixed drink once/month"    Review of Systems  Constitutional: Negative for fever.  Respiratory: Positive for shortness of breath.   Cardiovascular: Positive for chest pain. Negative for palpitations.  All other systems reviewed and are negative.     Allergies  Aspirin; Nsaids; Septra; Albuterol; Benicar; Salicylates; and Ampicillin  Home Medications   Prior to Admission medications   Medication Sig Start Date End Date Taking? Authorizing Provider  apixaban (ELIQUIS) 5 MG TABS tablet Take 1 tablet (5 mg total) by mouth 2 (two) times daily. 11/25/14  Yes Chipper Herb, MD  arformoterol (BROVANA) 15 MCG/2ML NEBU Take 2 mLs (15 mcg total) by nebulization 2 (two) times daily. 01/15/15  Yes Chipper Herb, MD  budesonide (PULMICORT) 0.5 MG/2ML nebulizer solution Take 2 mLs (0.5 mg total) by nebulization 2 (two) times daily. 09/15/14  Yes Nita Sells, MD  calcium carbonate (TUMS - DOSED IN MG ELEMENTAL CALCIUM) 500 MG chewable tablet Chew 2 tablets by mouth as needed for indigestion or heartburn.   Yes Historical Provider, MD  clopidogrel (PLAVIX) 75 MG tablet Take 1 tablet (75 mg total) by mouth daily. After decreasing dose of eliquis on 7/15 09/15/14  Yes Nita Sells, MD  DETROL LA 2 MG 24 hr capsule TAKE (1) CAPSULE DAILY 12/31/14  Yes Chipper Herb, MD  diltiazem [facility] CD) 120 MG 24 hr capsule Take one daily by mouth  BID as directed 10/23/14  Yes Chipper Herb, MD  MELATONIN PO Take 1 tablet by mouth at bedtime.   Yes Historical Provider, MD  metoprolol (LOPRESSOR) 50 MG tablet Take 0.5 tablets (25 mg total) by mouth 2 (two) times daily. Patient taking differently: Take 25 mg by mouth daily.  11/03/14  Yes Chipper Herb, MD  montelukast (SINGULAIR) 10 MG tablet TAKE ONE TABLET AT BEDTIME 12/01/14  Yes Chipper Herb, MD  Morphine Sulfate (MORPHINE CONCENTRATE) 10 MG/0.5ML SOLN concentrated solution Take 0.25 mLs (5 mg total) by mouth every 3 (three) hours as needed for anxiety or shortness of breath. 09/15/14  Yes Nita Sells, MD  nitroGLYCERIN (NITROSTAT) 0.4 MG SL tablet Place 1 tablet (0.4 mg total) under the tongue every 5 (five) minutes as needed for chest pain. 09/27/12  Yes  Rogelia Mire, NP  predniSONE (DELTASONE) 20 MG tablet TAKE (2) TABLETS BY MOUTH ONCE DAILY. Patient taking differently: TAKE 0.5 - 1  TABLET  BY MOUTH ONCE DAILY. Pt alternates tablets every other day. 1 tablet one day, half tablet the next. 12/15/14  Yes Chipper Herb, MD  traMADol (ULTRAM) 50 MG tablet TAKE ONE TABLET BY MOUTH UP TO 2 TIMES A DAY. Patient taking differently: TAKE HALF  TABLET BY MOUTH UP TO 2 TIMES A DAY AS NEEDED 01/06/15  Yes Chipper Herb, MD  digoxin (LANOXIN) 0.125 MG tablet Take 1 tablet (0.125 mg total) by mouth daily. 01/26/15   Thurnell Lose, MD  furosemide (LASIX) 40 MG tablet Take 1 tablet (40 mg total) by mouth 2 (two) times daily. 01/26/15   Thurnell Lose, MD   BP 111/61 mmHg  Pulse 82  Temp(Src) 98.4 F (36.9 C) (Oral)  Resp 16  Ht 5\' 8"  (1.727 m)  Wt 66.8 kg  BMI 22.40 kg/m2  SpO2 98% Physical Exam  Constitutional: He is oriented to person, place, and time. He appears well-developed and well-nourished.  HENT:  Head: Normocephalic and atraumatic.  Eyes: Conjunctivae are normal.  Neck: Normal range of motion. Neck supple.  Cardiovascular: An irregularly irregular rhythm  present.  Pulmonary/Chest: Effort normal. No respiratory distress. He has no wheezes.  His is on 2L of oxygen at bedside. No respiratory distress or use of accessory muscles.   Abdominal: Soft. He exhibits no distension.  Musculoskeletal: Normal range of motion.  Bilateral lower extremity edema and petechiae.  Neurological: He is alert and oriented to person, place, and time.  Skin: Skin is warm and dry.  Nursing note and vitals reviewed.   ED Course  Procedures (including critical care time) Labs Review Labs Reviewed  CBC WITH DIFFERENTIAL/PLATELET - Abnormal; Notable for the following:    WBC 13.5 (*)    RBC 3.28 (*)    Hemoglobin 9.5 (*)    HCT 31.0 (*)    RDW 22.3 (*)    Neutro Abs 9.2 (*)    Monocytes Absolute 1.1 (*)    All other components within normal limits  BASIC METABOLIC PANEL - Abnormal; Notable for the following:    Chloride 99 (*)    CO2 33 (*)    Glucose, Bld 121 (*)    BUN 27 (*)    GFR calc non Af Amer 50 (*)    GFR calc Af Amer 58 (*)    All other components within normal limits  PROTIME-INR - Abnormal; Notable for the following:    Prothrombin Time 19.8 (*)    INR 1.68 (*)    All other components within normal limits  BASIC METABOLIC PANEL - Abnormal; Notable for the following:    Chloride 100 (*)    Glucose, Bld 126 (*)    BUN 21 (*)    Calcium 8.7 (*)    GFR calc non Af Amer 56 (*)    All other components within normal limits  CBC - Abnormal; Notable for the following:    WBC 10.6 (*)    RBC 2.94 (*)    Hemoglobin 8.6 (*)    HCT 28.0 (*)    RDW 22.5 (*)    All other components within normal limits  PROTIME-INR - Abnormal; Notable for the following:    Prothrombin Time 21.0 (*)    INR 1.81 (*)    All other components within normal limits  BODY FLUID CELL COUNT  WITH DIFFERENTIAL - Abnormal; Notable for the following:    Color, Fluid YELLOW (*)    Appearance, Fluid HAZY (*)    Monocyte-Macrophage-Serous Fluid 10 (*)    All other components  within normal limits  LACTATE DEHYDROGENASE, BODY FLUID - Abnormal; Notable for the following:    LD, Fluid 104 (*)    All other components within normal limits  CBC - Abnormal; Notable for the following:    RBC 2.83 (*)    Hemoglobin 8.3 (*)    HCT 26.7 (*)    RDW 21.8 (*)    All other components within normal limits  BASIC METABOLIC PANEL - Abnormal; Notable for the following:    Chloride 100 (*)    CO2 33 (*)    Glucose, Bld 119 (*)    Calcium 8.3 (*)    GFR calc non Af Amer 59 (*)    Anion gap 4 (*)    All other components within normal limits  HEPATIC FUNCTION PANEL - Abnormal; Notable for the following:    Total Protein 5.5 (*)    Albumin 3.0 (*)    All other components within normal limits  CULTURE, BODY FLUID-BOTTLE  GRAM STAIN  TROPONIN I  LACTATE DEHYDROGENASE  PROTEIN, BODY FLUID  PH, BODY FLUID  I-STAT TROPOININ, ED  CYTOLOGY - NON PAP    Imaging Review Dg Chest 1 View  01/25/2015  CLINICAL DATA:  COPD. Left pleural effusion. Status post thoracentesis. Chronic lymphocytic leukemia. EXAM: CHEST 1 VIEW COMPARISON:  01/24/2015 FINDINGS: Previously seen left pleural effusion has decreased in size. No pneumothorax visualized. Small right pleural effusion is increased in size since previous study. Increased infiltrate or atelectasis seen in right lung base. Heart size is stable. Bilateral upper lobe scarring again noted. IMPRESSION: Decreased left pleural effusion.  No pneumothorax visualized. Increased small right pleural effusion and right basilar atelectasis or infiltrate. Electronically Signed   By: Earle Gell M.D.   On: 01/25/2015 15:42   Ct Chest Wo Contrast  01/26/2015  CLINICAL DATA:  Status post left thoracentesis yesterday, improved reading but with some left-sided chest soreness EXAM: CT CHEST WITHOUT CONTRAST TECHNIQUE: Multidetector CT imaging of the chest was performed following the standard protocol without IV contrast. COMPARISON:  09/08/2014 CT scan,  01/25/2015 radiograph FINDINGS: No pneumothorax identified. Patchy ground-glass attenuation throughout the right lung with more focal and extensive right lower lobe consolidation. There is a small right pleural effusion which appears possibly partially loculated. There is a moderate left pleural effusion. In the medial left upper lobe there is a band of opacification with right arteries scarring or subsegmental atelectasis. There is patchy diffuse ground-glass attenuation on the left. There is compressive atelectasis related pleural effusion in the left lung base. A 1 cm parenchymal calcification in the left lower lobe appears stable. At due to new there is cardiac enlargement with small pericardial effusion. There is extensive calcification of coronary arteries, aorta, and branch vessels. Limited evaluation of the mediastinum and bilateral hila without contrast. Known enlarged right hilar lymph nodes stable. Left upper quadrant cystic lesion stable from 09/01/2014 and 09/29/2008 consistent with known renal cyst. New no evidence of chest wall hematoma or other focal acute musculoskeletal findings. IMPRESSION: Moderate residual left pleural effusion with no evidence of pneumothorax. No evidence of acute chest wall abnormality. Patchy bilateral ground-glass attenuation with more focal consolidation right lung base possibly representing pneumonia/pneumonitis. Other nonacute findings described above. Electronically Signed   By: Elodia Florence.D.  On: 01/26/2015 09:11   Ir Thoracentesis Asp Pleural Space W/img Guide  01/25/2015  INDICATION: Symptomatic left sided pleural effusion - please perform ultrasound-guided thoracentesis for diagnostic and therapeutic purposes. EXAM: IR THORACENTESIS ASP PLEURAL SPACE W/IMG GUIDE COMPARISON:  Chest CT- 09/08/2014 ; ultrasound-guided right-sided thoracentesis - 08/26/2014 MEDICATIONS: None COMPLICATIONS: None immediate TECHNIQUE: Informed written consent was obtained from  the patient after a discussion of the risks, benefits and alternatives to treatment. A timeout was performed prior to the initiation of the procedure. Initial ultrasound scanning demonstrates a moderate to large sized left-sided pleural effusion. The lower chest was prepped and draped in the usual sterile fashion. 1% lidocaine was used for local anesthesia. An ultrasound image was saved for documentation purposes. An 8 Fr Safe-T-Centesis catheter was introduced. The thoracentesis was performed. The catheter was removed and a dressing was applied. The patient tolerated the procedure well without immediate post procedural complication. The patient was escorted to have an upright chest radiograph. FINDINGS: A total of approximately 900 cc of serous fluid was removed. Requested samples were sent to the laboratory. IMPRESSION: Successful ultrasound-guided left sided thoracentesis yielding 900 cc of pleural fluid. Electronically Signed   By: Sandi Mariscal M.D.   On: 01/25/2015 16:57   I have personally reviewed and evaluated these images and lab results as part of my medical decision-making.   EKG Interpretation   Date/Time:  Sunday January 24 2015 08:35:16 EST Ventricular Rate:  140 PR Interval:    QRS Duration: 83 QT Interval:  271 QTC Calculation: 413 R Axis:   30 Text Interpretation:  Atrial fibrillation Repolarization abnormality, prob  rate related SINCE LAST TRACING HEART RATE HAS INCREASED Confirmed by  Debby Freiberg [phone]) on 01/24/2015 8:49:58 AM      MDM   Final diagnoses:  Pleural effusion, left  Atrial fibrillation, unspecified type [facility])  Patient presents for chest pain that began early this morning. Recheck: Wife and daughter are now at bedside. Wife states that he is in hospice care for COPD and chronic respiratory failure. She states that he got up this morning and set up the edge of the bed and stated that he did not feel well. He had no complaints of pain at that time but  took 2 morphine dissolvable pills. She asked him if he wanted her to call the ambulance but he said no. She states that she called his daughter and EMS. His daughter states he is a DO NOT RESUSCITATE. Both family members agree that if his condition is treatable they would like him to be admitted.   He is in afib but otherwise comfortable.  His rate is in the 130s and he was given one bolus of diltiazem for rate control.  He has leukocytosis of 13 and his chest xray shows moderate left pleural effusion.  His troponin is negative and EKG shows afib. From his pulmonologist note in July of this year, the patient was going in and out of A. fib but they were concerned that repeated invasive interventions would lead to complications. I believe his chest pain is related to the pleural effusion.    I discussed this patient with Dr. Colin Rhein who has seen and evaluated the patient and agrees with admission.  I spoke to the hospitalist for admission to stepdown.  Ottie Glazier, PA-C 01/26/15 [id number]  Debby Freiberg, MD 01/27/15 Dyann Kief

## 2015-01-24 NOTE — Progress Notes (Signed)
PT Cancellation Note  Patient Details Name: Johnathan Strong MRN: [id number] DOB: 1923-05-12   Cancelled Treatment:    Reason Eval/Treat Not Completed: Patient declined, no reason specified - pt sleeping on arrival, family at bedside asks PT to come back tomorrow.     Kearney Hard [facility] 01/24/2015, 3:34 PM

## 2015-01-24 NOTE — Progress Notes (Signed)
Utilization Review Completed.  

## 2015-01-24 NOTE — ED Notes (Signed)
Cardiology at bedside.

## 2015-01-24 NOTE — H&P (Signed)
History and Physical  Patient Name: Johnathan Strong     [id number]    DOB: 08/08/1923    DOA: 01/24/2015 Referring physician: Samuel Bouche, PA-C  PCP: Redge Gainer, MD      Chief Complaint: Chest pain  HPI: Johnathan Strong is a 81 y.o. male with a past medical history significant for CLL, CAD s/p PCI x2, COPD on home O2, BPH, and recent parapneumonic effusion who presents with left sided chest pain for three days.  The patient was admitted for three weeks in June and July of this year for HCAP and parapneumonic effusion.  He underwent diagnostic tap followed by 38F and 4F chest tubes by PCCM, flushed with tPA. He was felt not to be a good VATS candidate, and so finally a pig-tail catheter was placed by IR and his antibiotics were continued and he stabilized and his effusion resolved by discharge.  He was discharged home with Hospice, but has been well since then.  He has gained about 20 lbs per wife, ambulates around the home with O2 and a walker, and goes out (e.g. to [employer] for lunch and to church).  Now, in the last two days, the patient has had dull pleuritic chest pain.  The pain is located in the left chest and left scapula.  It improves with morphine.  There has been no fever, chills, cough, or sputum.  The patient has had no new exertional dyspnea or worsening with exertion.    In the ED, the patient was afebrile but had atrial fibrillation with rapid ventricular rate.  His WBC was stable and renal function was stable.  A CXR showed a new left sided pleural effusion, moderate in size, not present on his last CXR in September.       Review of Systems:  Pt complains of left chest discomfort, decreased appetite yesterday, dry throat from O2. Pt denies any fever, chills, dyspnea, weakness, cough, sputum, emesis, swelling, rash.  All other systems negative except as just noted or noted in the history of present illness.   Allergies: Aspirin, anaphylaxis.  Bactrim, ampicillin  rash.  Intolerances to NSAIDs, albuterol, olmesartan.   Home medications: 1. Apixaban 5 mg twice daily 2. Diltiazem ER 120 mg twice daily 3. Metoprolol tartrate 25 mg twice daily 4. If formoterol nebulizer twice daily 5. Budesonide nebulizer twice daily 6. Clopidogrel 75 mg once daily 7. Detrol LA 2 mg once daily 8. Furosemide 20 mg twice daily 9. Montelukast 10 mg nightly 10. Prednisone 10 mg alternating with 20 mg daily every other day since discharge in July  Past medical history: 1. ASCVDs/p PCI 2 2. CLL followed by Dr. Laurance Flatten at [facility] 3. COPD: Stage III on home oxygen 4. Ventral hypertension 5. BPH 6. History of TIA 7. History of bladder cancer, status post bladder fulguration 8. Recurrent pleural effusion  Past surgical history: 1. Bladder tumor fulguration in 2013 2. Hemorrhoidectomy  Family history:  Father, atrial fibrillation, COPD/emphysema, CAD. Mother, kidney cancer, renal failure.   Social History:  Patient lives with his wife in [place]. He ambulates with a walker.  He is a remote former smoker. He works for [employer] for many years.      Physical Exam: BP 104/68 mmHg  Pulse 82  Temp(Src) 99.4 F (37.4 C) (Oral)  Resp 20  SpO2 100% General appearance: Thin elderly adult male, alert and in no acute distress.   Eyes: Anicteric, conjunctiva pink, lids and lashes normal.     ENT: No  nasal deformity, discharge, or epistaxis.  OP dry without lesions.   Skin: Warm and dry.  Senile purpura, ecchymoses.  No jaundice.  No suspicious rashes or lesions. Cardiac: Tachycardic, irregularly, irregular, nl S1-S2, no murmurs appreciated.  Capillary refill is brisk.  JVP normal.  Bilateral 2+ LE edema to ankle.  Radial pulses 2+ and symmetric. Respiratory: Normal respiratory rate and rhythm.  Coarse breath sounds at both bases, diminished at both bases, ?L>R.  No wheezes. Abdomen: Abdomen soft without rigidity.  No TTP. No ascites, distension.   MSK: No deformities or  effusions. Neuro: Sensorium intact and responding to questions, attention normal.  Speech is fluent.  Moves all extremities equally and with normal coordination.    Psych: Behavior appropriate.  Affect normal.  No evidence of aural or visual hallucinations or delusions.       Labs on Admission:  The metabolic panel shows normal sodium, potassium. Elevated bicarbonate. The BUN to creatinine ratio is elevated. The INR is elevated at 1.7 The troponin is negative. The complete blood count shows leukocytosis, 13.5K/uL, stable. Chronic normocytic anemia. Platelet count normal.   Radiological Exams on Admission: Personally reviewed: Dg Chest 2 View 01/24/2015  New left pleural effusion.    EKG: Independently reviewed. Atrial fibrillation with rate 140s    Assessment/Plan 1. Pleural effusion, left, new:  The patient presents with left sided pleuritic chest discomfort and new left sided pleural effusion.  The patient had a parapneumonic effusion in June/July.  He has no symptoms of pneumonia at this time, and so I will defer antibiotics at this time, but that is in the differential.  Other possible considerations are a cardiogenic effusion from Afib or malignant effusion. -IR thoracentesis now diagnostic and therapeutic -Consult to Pulmonology after pleural fluid studies -Low threshold to collect blood cultures and start antibiotics   2. Atrial fibrillation with RVR:  The patient's outpatient notes suggest that he is in and out of RVR.  CHADS2Vasc 5 for age, vascular disease and prior TIA. -Diltiazem gtt now until thoracentesis, then transition to home orals if rate controlled -Hold oral rate control agents -Hold anticoagulation until after thoracentesis, then resume  3. CLL:  Counts stable. -Daily CBC  4. COPD:  No evidence of active disease. -Continue home inhalers and O2. -Begin prednisone taper, which appears to have been begun in the hospital and continued for the last three  months  5. CAD:  Stable.  -Hold home plavix until after thoracentesis then resume  6. BPH:  Stable.  -Continue home Detrol     DVT PPx: Apixaban after thora Diet: Cardiac Consultants: Pulmonology Code Status: DNR Family Communication: Wife and son at bedside.  The patient's working diagnosis, plan for diagnostic/therapeutic thoracentesis and consultation with Pulmonology were discussed. CODE STATUS was confirmed.  All questions were answered.  Medical decision making: What exists of the patient's previous chart was reviewed in depth and the case was discussed with Dr. Halford Chessman. Patient seen 11:41 AM on 01/24/2015.  Disposition Plan:  Admit for thoracentesis.  Pulmonology consult.  The patient had a prolonged hospitalization and tenacious effusion during his last visit, and I anticipate >2 midnight stay.      Edwin Dada [facility] Pager [phone]

## 2015-01-25 ENCOUNTER — Inpatient Hospital Stay (HOSPITAL_COMMUNITY): Payer: Medicare Other

## 2015-01-25 DIAGNOSIS — R079 Chest pain, unspecified: Secondary | ICD-10-CM | POA: Insufficient documentation

## 2015-01-25 DIAGNOSIS — R0781 Pleurodynia: Secondary | ICD-10-CM

## 2015-01-25 DIAGNOSIS — J449 Chronic obstructive pulmonary disease, unspecified: Secondary | ICD-10-CM

## 2015-01-25 LAB — CBC
HCT: 28 % — ABNORMAL LOW (ref 39.0–52.0)
Hemoglobin: 8.6 g/dL — ABNORMAL LOW (ref 13.0–17.0)
MCH: 29.3 pg (ref 26.0–34.0)
MCHC: 30.7 g/dL (ref 30.0–36.0)
MCV: 95.2 fL (ref 78.0–100.0)
PLATELETS: 177 10*3/uL (ref 150–400)
RBC: 2.94 MIL/uL — ABNORMAL LOW (ref 4.22–5.81)
RDW: 22.5 % — AB (ref 11.5–15.5)
WBC: 10.6 10*3/uL — ABNORMAL HIGH (ref 4.0–10.5)

## 2015-01-25 LAB — BODY FLUID CELL COUNT WITH DIFFERENTIAL
Eos, Fluid: 0 %
Lymphs, Fluid: 82 %
MONOCYTE-MACROPHAGE-SEROUS FLUID: 10 % — AB (ref 50–90)
NEUTROPHIL FLUID: 8 % (ref 0–25)
WBC FLUID: 647 uL (ref 0–1000)

## 2015-01-25 LAB — BASIC METABOLIC PANEL
ANION GAP: 8 (ref 5–15)
BUN: 21 mg/dL — AB (ref 6–20)
CALCIUM: 8.7 mg/dL — AB (ref 8.9–10.3)
CO2: 32 mmol/L (ref 22–32)
Chloride: 100 mmol/L — ABNORMAL LOW (ref 101–111)
Creatinine, Ser: 1.13 mg/dL (ref 0.61–1.24)
GFR calc Af Amer: >60 mL/min (ref >60)
GFR, EST NON AFRICAN AMERICAN: 56 mL/min — AB (ref >60)
GLUCOSE: 126 mg/dL — AB (ref 65–99)
Potassium: 4.3 mmol/L (ref 3.5–5.1)
SODIUM: 140 mmol/L (ref 135–145)

## 2015-01-25 LAB — PROTEIN, BODY FLUID: Total protein, fluid: <3 g/dL

## 2015-01-25 LAB — GRAM STAIN

## 2015-01-25 LAB — PROTIME-INR
INR: 1.81 — AB (ref 0.00–1.49)
PROTHROMBIN TIME: 21 s — AB (ref 11.6–15.2)

## 2015-01-25 LAB — LACTATE DEHYDROGENASE: LDH: 158 U/L (ref 98–192)

## 2015-01-25 LAB — LACTATE DEHYDROGENASE, PLEURAL OR PERITONEAL FLUID: LD, Fluid: 104 U/L — ABNORMAL HIGH (ref 3–23)

## 2015-01-25 MED ORDER — DIGOXIN 125 MCG PO TABS
0.1250 mg | ORAL_TABLET | Freq: Every day | ORAL | Status: DC
  Administered 2015-01-26: 0.125 mg via ORAL
  Filled 2015-01-25: qty 1

## 2015-01-25 MED ORDER — DILTIAZEM HCL 60 MG PO TABS: 90.0000 mg | ORAL_TABLET | Freq: Four times a day (QID) | ORAL | Status: DC

## 2015-01-25 MED ORDER — FUROSEMIDE 20 MG PO TABS
20.0000 mg | ORAL_TABLET | Freq: Two times a day (BID) | ORAL | Status: DC
  Administered 2015-01-26: 20 mg via ORAL
  Filled 2015-01-25: qty 1

## 2015-01-25 MED ORDER — DILTIAZEM HCL 60 MG PO TABS
60.0000 mg | ORAL_TABLET | Freq: Four times a day (QID) | ORAL | Status: DC
  Administered 2015-01-25 – 2015-01-26 (×4): 60 mg via ORAL
  Filled 2015-01-25 (×4): qty 1

## 2015-01-25 MED ORDER — DIGOXIN 0.25 MG/ML IJ SOLN
0.5000 mg | Freq: Four times a day (QID) | INTRAMUSCULAR | Status: AC
  Administered 2015-01-25 (×2): 0.5 mg via INTRAVENOUS
  Filled 2015-01-25 (×2): qty 2

## 2015-01-25 MED ORDER — LIDOCAINE HCL 1 % IJ SOLN
INTRAMUSCULAR | Status: AC
  Filled 2015-01-25: qty 20

## 2015-01-25 NOTE — Progress Notes (Signed)
Patient Demographics:    Johnathan Strong, is a 81 y.o. male, DOB - November 04, 1923, [id number]  Admit date - 01/24/2015   Admitting Physician Edwin Dada, MD  Outpatient Primary MD for the patient is Redge Gainer, MD  LOS - 1   Chief Complaint  Patient presents with  . Chest Pain        Subjective:    [name] today has, No headache, No chest pain, No abdominal pain - No Nausea, No new weakness tingling or numbness, No Cough - mild shortness of breath.   Assessment  & Plan :     1. Chronic respiratory failure with Advanced COPD on home oxygen 2-3 L nasal cannula, history of parapneumonic effusion few months ago on the right side requiring chest 2 placement followed by pigtail catheter placement, now acute on chronic respiratory failure with bilateral pleural effusion left greater than right.   Pulmonary and IR both consult it, he is due for diagnostic and therapeutic thoracentesis on 01/25/2015. Continue supportive care with oxygen and nebulizer treatments, for now per pulmonary hold antibiotics as clinical suspicion for infection is low. No signs of COPD exacerbation currently on a prednisone taper.  Of note patient was home hospice status prior to coming to the hospital due to his chronic pulmonary issues.    2. Chronic atrial fibrillation [place] vascular to score of 4 or above. Currently in RVR and on Cardizem drip, blood pressure soft, we will add IV digoxin, skipped diuretic, try to place on oral Cardizem low-dose in the hopes of titrating off IV Cardizem.   3. CLL. Stable no acute issues.   4. CAD status post DES to the LAD in 2014. No acute issues, he is on Plavix at home which will be resumed after thoracentesis along with Eliquis. Not on beta blocker due to advanced  COPD.   5. BPH. Continue Detrol.    Code Status : DO NOT RESUSCITATE  Family Communication  : None present  Disposition Plan  : Home hospice in the next few days  Consults  : Pulmonary, IR  Procedures  :   Due for thoracentesis by IR/pulmonary  DVT Prophylaxis  :  Eliquis/SCDs   Lab Results  Component Value Date   PLT 177 01/25/2015    Inpatient Medications  Scheduled Meds: . arformoterol  15 mcg Nebulization BID  . budesonide  0.5 mg Nebulization BID  . digoxin  0.5 mg Intravenous Q6H  . [START ON 01/26/2015] digoxin  0.125 mg Oral Daily  . diltiazem  60 mg Oral 4 times per day  . fesoterodine  4 mg Oral Daily  . [START ON 01/26/2015] furosemide  20 mg Oral BID  . predniSONE  10 mg Oral Q breakfast  . sodium chloride  3 mL Intravenous Q12H   Continuous Infusions: . diltiazem (CARDIZEM) infusion 10 mg/hr (01/25/15 0942)   PRN Meds:.morphine CONCENTRATE, polyethylene glycol, traMADol  Antibiotics  :    Anti-infectives    None        Objective:   Filed Vitals:   01/25/15 0747 01/25/15 0810 01/25/15 0910 01/25/15 1010  BP:  115/65 100/47 115/67  Pulse:  114    Temp: 97.9 F (36.6 C)     TempSrc: Oral  Resp:  20    Height:      Weight:      SpO2:  92%      Wt Readings from Last 3 Encounters:  01/25/15 67.3 kg (148 lb 5.9 oz)  11/25/14 64.864 kg (143 lb)  09/14/14 70.444 kg (155 lb 4.8 oz)     Intake/Output Summary (Last 24 hours) at 01/25/15 1017 Last data filed at 01/25/15 0834  Gross per 24 hour  Intake 1282.25 ml  Output    300 ml  Net 982.25 ml     Physical Exam  Awake Alert, Oriented X 3, No new F.N deficits, Normal affect [place].AT,PERRAL Supple Neck,No JVD, No cervical lymphadenopathy appriciated.  Symmetrical Chest wall movement, moderate air movement bilaterally, managed by basilar breath sounds left much diminished as compared to the right side RRR,No Gallops,Rubs or new Murmurs, No Parasternal Heave +ve B.Sounds, Abd Soft,  No tenderness, No organomegaly appriciated, No rebound - guarding or rigidity. No Cyanosis, Clubbing or edema, No new Rash or bruise       Data Review:   Micro Results No results found for this or any previous visit (from the past 240 hour(s)).  Radiology Reports Dg Chest 2 View  01/24/2015  CLINICAL DATA:  Left-sided chest pain and shortness of breath. EXAM: CHEST  2 VIEW COMPARISON:  11/25/2014 and 09/13/2014 FINDINGS: There is a new moderate left pleural effusion. Chronic small right pleural effusion. Heart size is normal. Slight pulmonary vascular congestion. Scarring in the right midzone and laterally at the right base. Prominent right pericardial fat pad. Extensive calcification in the thoracic aorta. Calcified granuloma in the left lower lobe posteriorly. Extensive coronary artery calcification. IMPRESSION: 1. New moderate left pleural effusion. 2. Chronic small right pleural effusion scarring in the right lung. 3. New slight pulmonary vascular congestion. 4. Aortic atherosclerosis. Electronically Signed   By: Lorriane Shire M.D.   On: 01/24/2015 09:37     CBC  Recent Labs Lab 01/24/15 0918 01/25/15 0327  WBC 13.5* 10.6*  HGB 9.5* 8.6*  HCT 31.0* 28.0*  PLT 200 177  MCV 94.5 95.2  MCH 29.0 29.3  MCHC 30.6 30.7  RDW 22.3* 22.5*  LYMPHSABS 3.2  --   MONOABS 1.1*  --   EOSABS 0.0  --   BASOSABS 0.0  --     Chemistries   Recent Labs Lab 01/24/15 0918 01/25/15 0327  NA 140 140  K 4.0 4.3  CL 99* 100*  CO2 33* 32  GLUCOSE 121* 126*  BUN 27* 21*  CREATININE 1.24 1.13  CALCIUM 9.1 8.7*   ------------------------------------------------------------------------------------------------------------------ estimated creatinine clearance is 42.2 mL/min (by C-G formula based on Cr of 1.13). ------------------------------------------------------------------------------------------------------------------ No results for input(s): HGBA1C in the last 72  hours. ------------------------------------------------------------------------------------------------------------------ No results for input(s): CHOL, HDL, LDLCALC, TRIG, CHOLHDL, LDLDIRECT in the last 72 hours. ------------------------------------------------------------------------------------------------------------------ No results for input(s): TSH, T4TOTAL, T3FREE, THYROIDAB in the last 72 hours.  Invalid input(s): FREET3 ------------------------------------------------------------------------------------------------------------------ No results for input(s): VITAMINB12, FOLATE, FERRITIN, TIBC, IRON, RETICCTPCT in the last 72 hours.  Coagulation profile  Recent Labs Lab 01/24/15 0918 01/25/15 0327  INR 1.68* 1.81*    No results for input(s): DDIMER in the last 72 hours.  Cardiac Enzymes  Recent Labs Lab 01/24/15 1550  TROPONINI <0.03   ------------------------------------------------------------------------------------------------------------------ Invalid input(s): POCBNP   Time Spent in minutes  35   SINGH,PRASHANT K M.D on 01/25/2015 at 10:17 AM  Between 7am to 7pm - Pager - [phone]  After 7pm go to [email or web address] -  password [facility]  [facility] -  Office  [phone]

## 2015-01-25 NOTE — Progress Notes (Signed)
PT Cancellation Note  Patient Details Name: Johnathan Strong MRN: [id number] DOB: [birth year]   Cancelled Treatment:    Reason Eval/Treat Not Completed: Medical issues which prohibited therapy.  Pt currently on Cardizem drip w/ erratic HR per RN.  Pt is scheduled for thoracentesis today.  PT will continue to follow acutely and will re-attempt at a more appropriate time.    Joslyn Hy PT, DPT [phone] Pager: [phone] 01/25/2015, 12:05 PM

## 2015-01-25 NOTE — Consult Note (Signed)
Name: Johnathan Strong MRN: [id number] DOB: 07/04/1923    ADMISSION DATE:  01/24/2015 CONSULTATION DATE:  11/21  REFERRING MD :  Candiss Norse ([facility])  CHIEF COMPLAINT:  Pleural effusion   BRIEF PATIENT DESCRIPTION: 81yo male with hx CLL, AFib, CAD, COPD, HTN.  Was admitted over the summer with HCAP and R sided para-pneumonic effusion ultimately requiring chest tube and felt to be poor VATS candidate.  He was eventually d/c home with hospice but has been doing well at home.  Returned 11/20 with L sided pleuritic chest pain.  CXR revealed moderate L effusion.  PCCM consulted.   SIGNIFICANT EVENTS    STUDIES:     HISTORY OF PRESENT ILLNESS:  81yo male with hx CLL, AFib, CAD, COPD, HTN.  Was admitted over the summer with HCAP and R sided para-pneumonic effusion ultimately requiring chest tube and felt to be poor VATS candidate.  He was eventually d/c home with hospice but has been doing well at home.  Returned 11/20 with L sided pleuritic chest pain.  CXR revealed moderate L effusion.  PCCM consulted.   C/o 3 days worsening pleuritic chest pain and mild SOB.  Denies cough, hemoptysis, fever, chills, n/v/d, weight loss.  Denies fall/trauma.    PAST MEDICAL HISTORY :   has a past medical history of Osteopenia (2006); Hyperlipidemia; BPH (benign prostatic hypertrophy); Transient global amnesia; Osteoarthritis; History of TIA (transient ischemic attack) (1980'S); COPD (chronic obstructive pulmonary disease) with emphysema ([place]); Chronic back pain; Itching (SECONDARY TO CLL-- CONTROLLED W/ SINGULAIR); Frequency; Nocturia; Hypertension (CARDIOLOGIST- DR BRACKBILL-- LAST VISIT 12-15-2010 NOTE IN EPIC); CLL (chronic lymphocytic leukemia) (HCC) (LAST PLT COUNT APRL 2012  168); Bladder cancer ([place]); Aspirin allergy; Asthma; Dyspnea on exertion; and CAD (coronary artery disease).  has past surgical history that includes Excisional hemorrhoidectomy (1980); Cardiovascular stress test (07/2005); Transurethral  resection of bladder tumor (10-19-2008); Cataract extraction w/ intraocular lens  implant, bilateral (Bilateral, ~ 2007); transthoracic echocardiogram (01-03-2011); Cystoscopy with biopsy (10/30/2011); Cardiac catheterization (08/27/2012); Coronary angioplasty with stent (09/26/2012); Coronary stent placement (09/26/12); left heart catheterization with coronary angiogram (N/A, 08/27/2012); and percutaneous coronary stent intervention (pci-s) (N/A, 09/26/2012). Prior to Admission medications   Medication Sig Start Date End Date Taking? Authorizing Provider  apixaban (ELIQUIS) 5 MG TABS tablet Take 1 tablet (5 mg total) by mouth 2 (two) times daily. 11/25/14  Yes Chipper Herb, MD  arformoterol (BROVANA) 15 MCG/2ML NEBU Take 2 mLs (15 mcg total) by nebulization 2 (two) times daily. 01/15/15  Yes Chipper Herb, MD  budesonide (PULMICORT) 0.5 MG/2ML nebulizer solution Take 2 mLs (0.5 mg total) by nebulization 2 (two) times daily. 09/15/14  Yes Nita Sells, MD  calcium carbonate (TUMS - DOSED IN MG ELEMENTAL CALCIUM) 500 MG chewable tablet Chew 2 tablets by mouth as needed for indigestion or heartburn.   Yes Historical Provider, MD  clopidogrel (PLAVIX) 75 MG tablet Take 1 tablet (75 mg total) by mouth daily. After decreasing dose of eliquis on 7/15 09/15/14  Yes Nita Sells, MD  DETROL LA 2 MG 24 hr capsule TAKE (1) CAPSULE DAILY 12/31/14  Yes Chipper Herb, MD  diltiazem [facility] CD) 120 MG 24 hr capsule Take one daily by mouth BID as directed 10/23/14  Yes Chipper Herb, MD  furosemide (LASIX) 20 MG tablet Take 20 mg by mouth 2 (two) times daily.   Yes Historical Provider, MD  MELATONIN PO Take 1 tablet by mouth at bedtime.   Yes Historical Provider, MD  metoprolol (LOPRESSOR)  50 MG tablet Take 0.5 tablets (25 mg total) by mouth 2 (two) times daily. Patient taking differently: Take 25 mg by mouth daily.  11/03/14  Yes Chipper Herb, MD  montelukast (SINGULAIR) 10 MG tablet TAKE ONE TABLET AT  BEDTIME 12/01/14  Yes Chipper Herb, MD  Morphine Sulfate (MORPHINE CONCENTRATE) 10 MG/0.5ML SOLN concentrated solution Take 0.25 mLs (5 mg total) by mouth every 3 (three) hours as needed for anxiety or shortness of breath. 09/15/14  Yes Nita Sells, MD  nitroGLYCERIN (NITROSTAT) 0.4 MG SL tablet Place 1 tablet (0.4 mg total) under the tongue every 5 (five) minutes as needed for chest pain. 09/27/12  Yes Rogelia Mire, NP  predniSONE (DELTASONE) 20 MG tablet TAKE (2) TABLETS BY MOUTH ONCE DAILY. Patient taking differently: TAKE 0.5 - 1  TABLET  BY MOUTH ONCE DAILY. Pt alternates tablets every other day. 1 tablet one day, half tablet the next. 12/15/14  Yes Chipper Herb, MD  traMADol (ULTRAM) 50 MG tablet TAKE ONE TABLET BY MOUTH UP TO 2 TIMES A DAY. Patient taking differently: TAKE HALF  TABLET BY MOUTH UP TO 2 TIMES A DAY AS NEEDED 01/06/15  Yes Chipper Herb, MD   Allergies  Allergen Reactions  . Aspirin Anaphylaxis, Shortness Of Breath and Swelling  . Nsaids Anaphylaxis, Shortness Of Breath and Swelling    Raise BP  . Septra [Sulfamethoxazole-Trimethoprim] Rash  . Albuterol     Can not tolerate high/ frequent doses due to Angie with using HFA PRN  . Benicar [Olmesartan Medoxomil]     dizzy  . Salicylates     SOB  . Ampicillin Rash    FAMILY HISTORY:  family history includes Atrial fibrillation in his father; COPD in his father; Emphysema in his father; Heart disease in his father; Kidney cancer in his mother; Kidney failure in his mother. SOCIAL HISTORY:  reports that he quit smoking about 54 years ago. His smoking use included Cigarettes. He has a 25 pack-year smoking history. He quit smokeless tobacco use about 36 years ago. He reports that he drinks alcohol. He reports that he does not use illicit drugs.  REVIEW OF SYSTEMS:   As per HPI - All other systems reviewed and were neg.    SUBJECTIVE:   VITAL SIGNS: Temp:  [97.9 F (36.6 C)-99.5 F (37.5 C)]  97.9 F (36.6 C) (11/21 0747) Pulse Rate:  [37-161] 37 (11/21 0610) Resp:  [9-34] 13 (11/21 0610) BP: (98-137)/(54-95) 124/73 mmHg (11/21 0610) SpO2:  [87 %-100 %] 100 % (11/21 0717) Weight:  [148 lb 5.9 oz (67.3 kg)-150 lb 8 oz (68.266 kg)] 148 lb 5.9 oz (67.3 kg) (11/21 0500)  PHYSICAL EXAMINATION: General:  Pleasant, elderly male, NAD in chair  Neuro:  Sleepy but easily arousable, appropriate, MAE, gen weakness  HEENT:  Mm moist, no JVD  Cardiovascular:  s1s2 irreg  Lungs:  resps even non labored on [place], few exp wheeze, diminished L  Abdomen:  Soft, +bs  Musculoskeletal:  Warm and dry, 2+ BLE edema    Recent Labs Lab 01/24/15 0918 01/25/15 0327  NA 140 140  K 4.0 4.3  CL 99* 100*  CO2 33* 32  BUN 27* 21*  CREATININE 1.24 1.13  GLUCOSE 121* 126*    Recent Labs Lab 01/24/15 0918 01/25/15 0327  HGB 9.5* 8.6*  HCT 31.0* 28.0*  WBC 13.5* 10.6*  PLT 200 177   Dg Chest 2 View  01/24/2015  CLINICAL DATA:  Left-sided chest pain and shortness of breath. EXAM: CHEST  2 VIEW COMPARISON:  11/25/2014 and 09/13/2014 FINDINGS: There is a new moderate left pleural effusion. Chronic small right pleural effusion. Heart size is normal. Slight pulmonary vascular congestion. Scarring in the right midzone and laterally at the right base. Prominent right pericardial fat pad. Extensive calcification in the thoracic aorta. Calcified granuloma in the left lower lobe posteriorly. Extensive coronary artery calcification. IMPRESSION: 1. New moderate left pleural effusion. 2. Chronic small right pleural effusion scarring in the right lung. 3. New slight pulmonary vascular congestion. 4. Aortic atherosclerosis. Electronically Signed   By: Lorriane Shire M.D.   On: 01/24/2015 09:37    ASSESSMENT / PLAN:  Moderate Left pleural effusion - new.  No evidence PNA at this time.  ?volume overload r/t Afib v malignant effusion v parapneumonic (doubt).   Per daughter, pt had outpt CXR with "something  questionable for cancer" and was due for 6 week f/u COPD - without exacerbation  Chronic small R effusion  REC -  Awaiting IR thoracentesis - would send for gram stain and culture, cell count, fungal culture, glucose, LDH, and cytology Gentle diuresis  Supplemental O2 as needed - 2L home O2 Hold off on abx for now  Await pleural fluid studies  Consider CT chest post thoracentesis  F/u CXR  Pulmonary hygiene  Continue maintenance prednisone  Continue brovana, pulmicort   Nickolas Madrid, NP 01/25/2015  9:18 AM Pager: [phone] or [phone]

## 2015-01-25 NOTE — Procedures (Signed)
Successful US guided left sided thoracentesis yielding 600 cc of serous pleural fluid.   Samples sent to lab for analysis. No immediate complications.  Ronny Bacon, MD Pager #: [phone]

## 2015-01-26 ENCOUNTER — Inpatient Hospital Stay (HOSPITAL_COMMUNITY): Payer: Medicare Other

## 2015-01-26 LAB — CBC
HEMATOCRIT: 26.7 % — AB (ref 39.0–52.0)
Hemoglobin: 8.3 g/dL — ABNORMAL LOW (ref 13.0–17.0)
MCH: 29.3 pg (ref 26.0–34.0)
MCHC: 31.1 g/dL (ref 30.0–36.0)
MCV: 94.3 fL (ref 78.0–100.0)
PLATELETS: 167 10*3/uL (ref 150–400)
RBC: 2.83 MIL/uL — ABNORMAL LOW (ref 4.22–5.81)
RDW: 21.8 % — AB (ref 11.5–15.5)
WBC: 9.3 10*3/uL (ref 4.0–10.5)

## 2015-01-26 LAB — HEPATIC FUNCTION PANEL
ALBUMIN: 3 g/dL — AB (ref 3.5–5.0)
ALT: 32 U/L (ref 17–63)
AST: 33 U/L (ref 15–41)
Alkaline Phosphatase: 49 U/L (ref 38–126)
Bilirubin, Direct: 0.4 mg/dL (ref 0.1–0.5)
Indirect Bilirubin: 0.7 mg/dL (ref 0.3–0.9)
Total Bilirubin: 1.1 mg/dL (ref 0.3–1.2)
Total Protein: 5.5 g/dL — ABNORMAL LOW (ref 6.5–8.1)

## 2015-01-26 LAB — BASIC METABOLIC PANEL
ANION GAP: 4 — AB (ref 5–15)
BUN: 18 mg/dL (ref 6–20)
CALCIUM: 8.3 mg/dL — AB (ref 8.9–10.3)
CO2: 33 mmol/L — ABNORMAL HIGH (ref 22–32)
Chloride: 100 mmol/L — ABNORMAL LOW (ref 101–111)
Creatinine, Ser: 1.08 mg/dL (ref 0.61–1.24)
GFR, EST NON AFRICAN AMERICAN: 59 mL/min — AB (ref >60)
Glucose, Bld: 119 mg/dL — ABNORMAL HIGH (ref 65–99)
Potassium: 4 mmol/L (ref 3.5–5.1)
Sodium: 137 mmol/L (ref 135–145)

## 2015-01-26 LAB — PATHOLOGIST SMEAR REVIEW

## 2015-01-26 MED ORDER — DIGOXIN 125 MCG PO TABS: 0.1250 mg | ORAL_TABLET | Freq: Every day | ORAL | Status: DC

## 2015-01-26 MED ORDER — FUROSEMIDE 40 MG PO TABS: 40.0000 mg | ORAL_TABLET | Freq: Two times a day (BID) | ORAL | Status: DC

## 2015-01-26 NOTE — Progress Notes (Signed)
Name: Johnathan Strong MRN: [id number] DOB: 10-Oct-1923    ADMISSION DATE:  01/24/2015 CONSULTATION DATE:  11/21  REFERRING MD :  Candiss Norse ([facility])  CHIEF COMPLAINT:  Pleural effusion   BRIEF PATIENT DESCRIPTION: 81yo male with hx CLL, AFib, CAD, COPD, HTN.  Was admitted over the summer with HCAP and R sided para-pneumonic effusion ultimately requiring chest tube and felt to be poor VATS candidate.  He was eventually d/c home with hospice but has been doing well at home.  Returned 11/20 with L sided pleuritic chest pain.  CXR revealed moderate L effusion.  PCCM consulted.   SIGNIFICANT EVENTS  11/20 - Admit to hospital 11/21 - IR Thoracentesis Left:  Removed 900cc.  STUDIES:  CT CHEST W/O 11/21 - Minimal residual consolidation in RLL improved since prior CT as is the right effusion compared with July 2016. Small to moderate residual left pleural effusion. Patchy ground glass noted. No pathologic mediastinal adenopathy.  LEFT PFA (01/25/15): LDH - 104 Total protein - <3.0 WBC - 647 (82% lymphocytes, 8% neutrophils, 10% monocytes)  MICROBIOLOGY: Left Pleural Effusion (11/21)>>>  PATHOLOGY: Left Pleural Effusion Cytology (11/21)>>>  HISTORY OF PRESENT ILLNESS:  81yo male with hx CLL, AFib, CAD, COPD, HTN.  Was admitted over the summer with HCAP and R sided para-pneumonic effusion ultimately requiring chest tube and felt to be poor VATS candidate.  He was eventually d/c home with hospice but has been doing well at home.  Returned 11/20 with L sided pleuritic chest pain.  CXR revealed moderate L effusion.  PCCM consulted. C/o 3 days worsening pleuritic chest pain and mild SOB.  Denies cough, hemoptysis, fever, chills, n/v/d, weight loss.  Denies fall/trauma.    SUBJECTIVE: Patient denies any new dyspnea. Denies any chest pain or pressure. Reports his breathing may have improved slightly since his thoracentesis yesterday but he has not yet ambulated significantly. No significant  cough.  REVIEW OF SYSTEMS:  No subjective fever, chills or sweats. No nausea or vomiting.  VITAL SIGNS: Temp:  [97.7 F (36.5 C)-98.4 F (36.9 C)] 98.4 F (36.9 C) (11/22 0721) Pulse Rate:  [69-115] 82 (11/22 0735) Resp:  [14-20] 16 (11/22 0735) BP: (106-133)/(49-67) 111/61 mmHg (11/22 0735) SpO2:  [93 %-100 %] 100 % (11/22 0735) Weight:  [147 lb 4.3 oz (66.8 kg)] 147 lb 4.3 oz (66.8 kg) (11/22 0454)  PHYSICAL EXAMINATION: General:  Pleasant, elderly male, NAD in chair  Neuro:  Sleepy but easily arousable, appropriate, MAE, gen weakness  HEENT:  Mm moist, no JVD  Cardiovascular:  s1s2 irreg  Lungs:  resps even non labored on [place], few exp wheeze, diminished L  Abdomen:  Soft, +bs  Musculoskeletal:  Warm and dry, 2+ BLE edema    Recent Labs Lab 01/24/15 0918 01/25/15 0327 01/26/15 0451  NA 140 140 137  K 4.0 4.3 4.0  CL 99* 100* 100*  CO2 33* 32 33*  BUN 27* 21* 18  CREATININE 1.24 1.13 1.08  GLUCOSE 121* 126* 119*    Recent Labs Lab 01/24/15 0918 01/25/15 0327 01/26/15 0451  HGB 9.5* 8.6* 8.3*  HCT 31.0* 28.0* 26.7*  WBC 13.5* 10.6* 9.3  PLT 200 177 167   Dg Chest 1 View  01/25/2015  CLINICAL DATA:  COPD. Left pleural effusion. Status post thoracentesis. Chronic lymphocytic leukemia. EXAM: CHEST 1 VIEW COMPARISON:  01/24/2015 FINDINGS: Previously seen left pleural effusion has decreased in size. No pneumothorax visualized. Small right pleural effusion is increased in size since previous study. Increased infiltrate  or atelectasis seen in right lung base. Heart size is stable. Bilateral upper lobe scarring again noted. IMPRESSION: Decreased left pleural effusion.  No pneumothorax visualized. Increased small right pleural effusion and right basilar atelectasis or infiltrate. Electronically Signed   By: Earle Gell M.D.   On: 01/25/2015 15:42   Ir Thoracentesis Asp Pleural Space W/img Guide  01/25/2015  INDICATION: Symptomatic left sided pleural effusion - please  perform ultrasound-guided thoracentesis for diagnostic and therapeutic purposes. EXAM: IR THORACENTESIS ASP PLEURAL SPACE W/IMG GUIDE COMPARISON:  Chest CT- 09/08/2014 ; ultrasound-guided right-sided thoracentesis - 08/26/2014 MEDICATIONS: None COMPLICATIONS: None immediate TECHNIQUE: Informed written consent was obtained from the patient after a discussion of the risks, benefits and alternatives to treatment. A timeout was performed prior to the initiation of the procedure. Initial ultrasound scanning demonstrates a moderate to large sized left-sided pleural effusion. The lower chest was prepped and draped in the usual sterile fashion. 1% lidocaine was used for local anesthesia. An ultrasound image was saved for documentation purposes. An 8 Fr Safe-T-Centesis catheter was introduced. The thoracentesis was performed. The catheter was removed and a dressing was applied. The patient tolerated the procedure well without immediate post procedural complication. The patient was escorted to have an upright chest radiograph. FINDINGS: A total of approximately 900 cc of serous fluid was removed. Requested samples were sent to the laboratory. IMPRESSION: Successful ultrasound-guided left sided thoracentesis yielding 900 cc of pleural fluid. Electronically Signed   By: Sandi Mariscal M.D.   On: 01/25/2015 16:57    ASSESSMENT / PLAN:  81 year old male with left-sided pleuritic chest pain and acute on percent of shortness of breath. The patient's left pleural effusion is transudative. Cultures and cytology are pending. Briefly discussed indwelling pleural catheter but for now I would hold off and simply increase his diuretic while awaiting finalization of cytology & cultures.   1. Transudative left pleural effusion: Awaiting pleural fluid culture\, & cytology. Recommend increasing patient's diuretic. 2. Acute hypoxic respiratory failure: Recommend continued aggressive pulmonary toilet. Wean FiO2 for saturation greater than  94%. Ambulate prior to discharge. 3. Left pleuritic chest pain: Patient on chronic prednisone. Continuing home dose. 4. COPD: No evidence of exacerbation. Continuing Brovana & budesonide.  Sonia Baller Ashok Cordia, M.D. [facility] Pulmonary [facility] Critical [facility] Pager:  [phone] After 3pm or if no response, call [phone]  01/26/2015  9:10 AM

## 2015-01-26 NOTE — Care Management Note (Signed)
Case Management Note  Patient Details  Name: Johnathan Strong MRN: [id number] Date of Birth: Dec 09, 1923  Subjective/Objective: Pt admitted for pleural effusion. Pt is active with [facility]. CM did call Hospice to make them aware that pt will be d/c today.                     Action/Plan:  CM did call [employer] in [place] and the provider could not find that pt had 02 on file. CM then called Rick with Hospice and he is going to call Pharmacy to get 02 delivered today. DME to be delivered to hospital after 3pm. CM did see if family will be able to bring a tank back to hospital.  No further needs from CM at this time.    Expected Discharge Date:                  Expected Discharge Plan:  Home w Hospice Care  In-House Referral:  NA  Discharge planning Services  CM Consult  Post Acute Care Choice:  Resumption of Svcs/PTA Provider, Hospice Choice offered to:  Patient  DME Arranged:  Oxygen DME Agency:  [facility]  HH Arranged:  RN (Hospice) [place]:  [facility]  Status of Service:  Completed, signed off  Medicare Important Message Given:    Date Medicare IM Given:    Medicare IM give by:    Date Additional Medicare IM Given:    Additional Medicare Important Message give by:     If discussed at [place] of Stay Meetings, dates discussed:    Additional Comments:  Bethena Roys, RN 01/26/2015, 11:46 AM

## 2015-01-26 NOTE — Discharge Summary (Signed)
Johnathan Strong, is a 81 y.o. male  DOB 10-Sep-1923  MRN [id number].  Admission date:  01/24/2015  Admitting Physician  Edwin Dada, MD  Discharge Date:  01/26/2015   Primary MD  Redge Gainer, MD  Recommendations for primary care physician for things to follow:   Monitor BMP and two-view chest x-ray in 7-10 days. Adjust diuretic dose as needed. He is home hospice with [facility] being comfort.   Admission Diagnosis  Pleural effusion, left [J94.8] Atrial fibrillation, unspecified type [facility]) [I48.91]   Discharge Diagnosis  Pleural effusion, left [J94.8] Atrial fibrillation, unspecified type ([place]) [I48.91]     Principal Problem:   Pleural effusion, left Active Problems:   COPD GOLD III   Chronic lymphocytic leukemia   CAD (coronary artery disease)   Atrial fibrillation with RVR (HCC)   Essential hypertension   Chronic respiratory failure (HCC)   Chest pain      Past Medical History  Diagnosis Date  . Osteopenia 2006  . Hyperlipidemia   . BPH (benign prostatic hypertrophy)   . Transient global amnesia   . Osteoarthritis   . History of TIA (transient ischemic attack) 1980'S    NO RESIDUAL  . COPD (chronic obstructive pulmonary disease) with emphysema ([place])   . Chronic back pain   . Itching SECONDARY TO CLL-- CONTROLLED W/ SINGULAIR  . Frequency   . Nocturia   . Hypertension CARDIOLOGIST- DR BRACKBILL-- LAST VISIT 12-15-2010 NOTE IN EPIC  . CLL (chronic lymphocytic leukemia) (HCC) LAST PLT COUNT APRL 2012  168    Managed at [place]  . Bladder cancer ([place])   . Aspirin allergy     Eyes swell  . Asthma   . Dyspnea on exertion   . CAD (coronary artery disease)     a. 08/2012 Cath: LM nl, LAD 90p, LCX 20-30, RCA 100ost fills via L->R collats, EF 55-65%;  b. 09/2012 PCI of prox  LAD with 3.0x16 Promus DES.    Past Surgical History  Procedure Laterality Date  . Excisional hemorrhoidectomy  1980  . Cardiovascular stress test  07/2005    a. 07/2005- no reversible ischemia, normal EF b. 06/2009- EF 73%, no reversible ischemia, inferolateral TWIs at rest, upright in recovery c. 08/2012- mediuim-sized partially reversible basal to mid inferior and inferoseptal perfusion defect c/w with prior infarction and peri-infarct ischemia; EF 68% and borderline ST/T changes on stress ECG   . Transurethral resection of bladder tumor  10-19-2008    AND DILATION URETHRAL STRICTURE  . Cataract extraction w/ intraocular lens  implant, bilateral Bilateral ~ 2007  . Transthoracic echocardiogram  01-03-2011    LVEF [id number], grade 1 diastolic dysfunction, no WMAs or structural abnormalities  . Cystoscopy with biopsy  10/30/2011    Procedure: CYSTOSCOPY WITH BIOPSY;  Surgeon: Claybon Jabs, MD;  Location: [facility];  Service: Urology;  Laterality: N/A;  . Cardiac catheterization  08/27/2012  . Coronary angioplasty with stent placement  09/26/2012    "1" (09/26/2012)  .  Coronary stent placement  09/26/12  . Left heart catheterization with coronary angiogram N/A 08/27/2012    Procedure: LEFT HEART CATHETERIZATION WITH CORONARY ANGIOGRAM;  Surgeon: Peter M Martinique, MD;  Location: [facility] CATH LAB;  Service: Cardiovascular;  Laterality: N/A;  . Percutaneous coronary stent intervention (pci-s) N/A 09/26/2012    Procedure: PERCUTANEOUS CORONARY STENT INTERVENTION (PCI-S);  Surgeon: Peter M Martinique, MD;  Location: [facility] CATH LAB;  Service: Cardiovascular;  Laterality: N/A;       HPI  from the history and physical done on the day of admission:   Johnathan Strong is a 81 y.o. male with a past medical history significant for CLL, CAD s/p PCI x2, COPD on home O2, BPH, and recent parapneumonic effusion who presents with left sided chest pain for three days.  The patient was admitted for three weeks  in June and July of this year for HCAP and parapneumonic effusion. He underwent diagnostic tap followed by 54F and 63F chest tubes by PCCM, flushed with tPA. He was felt not to be a good VATS candidate, and so finally a pig-tail catheter was placed by IR and his antibiotics were continued and he stabilized and his effusion resolved by discharge. He was discharged home with Hospice, but has been well since then. He has gained about 20 lbs per wife, ambulates around the home with O2 and a walker, and goes out (e.g. to [employer] for lunch and to church).  Now, in the last two days, the patient has had dull pleuritic chest pain. The pain is located in the left chest and left scapula. It improves with morphine. There has been no fever, chills, cough, or sputum. The patient has had no new exertional dyspnea or worsening with exertion.   In the ED, the patient was afebrile but had atrial fibrillation with rapid ventricular rate. His WBC was stable and renal function was stable. A CXR showed a new left sided pleural effusion, moderate in size, not present on his last CXR in September.        Hospital Course:    1. Chronic respiratory failure with Advanced COPD on home oxygen 2-3 L nasal cannula, history of parapneumonic effusion few months ago on the right side requiring chest 2 placement followed by pigtail catheter placement, now acute on chronic respiratory failure with bilateral pleural effusion left much greater than right.   Pulmonary and IR both consulted, he underwent diagnostic and therapeutic thoracentesis on 01/25/2015. Pleural fluid was suggestive of transudative exudate, shortness of breath much improved and he is now at baseline. Continue supportive care with oxygen and nebulizer treatments, discussed his case with pulmonary physician Dr. Ashok Cordia who saw the patient today, he also reviewed his CT scan which was done today, per Dr. Ashok Cordia increase his diuretics, discharged home with home  hospice. No need for antibiotics and no clinical evidence of pneumonia or infection. Goal of care is comfort. Request ECP to monitor diuretic dose and BMP closely.  Of note patient was home hospice status prior to coming to the hospital due to his chronic pulmonary issues. This will be resumed.    2. Chronic atrial fibrillation [place] vascular to score of 4 or above. Continue home dose anticoagulation, continue metoprolol and Cardizem at home dose, added digoxin with good effect heart rate much improved and was in RVR upon admission. Monitor renal function closely with the addition of digoxin.   3. CLL. Stable no acute issues.   4. CAD status post DES to the LAD  in 2014. No acute issues, he is on Plavix at home which will be resumed along with Eliquis. Resume home dose better blocker.   5. BPH. Continue Detrol.     Discharge Condition: Guarded, has home hospice  Follow UP  Follow-up Information    Follow up with Redge Gainer, MD. Schedule an appointment as soon as possible for a visit in 1 week.   Specialty:  Family Medicine   Contact information:   [place] [address] [phone]        Consults obtained - pulmonary, IR  Diet and Activity recommendation: See Discharge Instructions below  Discharge Instructions       Discharge Instructions    Discharge instructions    Complete by:  As directed   Follow with Primary MD Redge Gainer, MD in 7 days   Get CBC, CMP, 2 view Chest X ray checked  by Primary MD next visit.    Activity: As tolerated with Full fall precautions use walker/cane & assistance as needed   Disposition Home with hospice   Diet: Heart Healthy  Check your Weight same time everyday, if you gain over 2 pounds, or you develop in leg swelling, experience more shortness of breath or chest pain, call your Primary MD immediately. Follow Cardiac Low Salt Diet and 1.5 lit/day fluid restriction.   On your next visit with your primary care  physician please Get Medicines reviewed and adjusted.   Please request your Prim.MD to go over all Hospital Tests and Procedure/Radiological results at the follow up, please get all Hospital records sent to your Prim MD by signing hospital release before you go home.   If you experience worsening of your admission symptoms, develop shortness of breath, life threatening emergency, suicidal or homicidal thoughts you must seek medical attention immediately by calling 911 or calling your MD immediately  if symptoms less severe.  You Must read complete instructions/literature along with all the possible adverse reactions/side effects for all the Medicines you take and that have been prescribed to you. Take any new Medicines after you have completely understood and accpet all the possible adverse reactions/side effects.   Do not drive, operating heavy machinery, perform activities at heights, swimming or participation in water activities or provide baby sitting services if your were admitted for syncope or siezures until you have seen by Primary MD or a Neurologist and advised to do so again.  Do not drive when taking Pain medications.    Do not take more than prescribed Pain, Sleep and Anxiety Medications  Special Instructions: If you have smoked or chewed Tobacco  in the last 2 yrs please stop smoking, stop any regular Alcohol  and or any Recreational drug use.  Wear Seat belts while driving.   Please note  You were cared for by a hospitalist during your hospital stay. If you have any questions about your discharge medications or the care you received while you were in the hospital after you are discharged, you can call the unit and asked to speak with the hospitalist on call if the hospitalist that took care of you is not available. Once you are discharged, your primary care physician will handle any further medical issues. Please note that NO REFILLS for any discharge medications will be  authorized once you are discharged, as it is imperative that you return to your primary care physician (or establish a relationship with a primary care physician if you do not have one) for your aftercare  needs so that they can reassess your need for medications and monitor your lab values.     Increase activity slowly    Complete by:  As directed              Discharge Medications       Medication List    TAKE these medications        apixaban 5 MG Tabs tablet  Commonly known as:  ELIQUIS  Take 1 tablet (5 mg total) by mouth 2 (two) times daily.     arformoterol 15 MCG/2ML Nebu  Commonly known as:  BROVANA  Take 2 mLs (15 mcg total) by nebulization 2 (two) times daily.     budesonide 0.5 MG/2ML nebulizer solution  Commonly known as:  PULMICORT  Take 2 mLs (0.5 mg total) by nebulization 2 (two) times daily.     calcium carbonate 500 MG chewable tablet  Commonly known as:  TUMS - dosed in mg elemental calcium  Chew 2 tablets by mouth as needed for indigestion or heartburn.     clopidogrel 75 MG tablet  Commonly known as:  PLAVIX  Take 1 tablet (75 mg total) by mouth daily. After decreasing dose of eliquis on 7/15     DETROL LA 2 MG 24 hr capsule  Generic drug:  tolterodine  TAKE (1) CAPSULE DAILY     digoxin 0.125 MG tablet  Commonly known as:  LANOXIN  Take 1 tablet (0.125 mg total) by mouth daily.     diltiazem 120 MG 24 hr capsule  Commonly known as:  CARDIZEM CD  Take one daily by mouth BID as directed     furosemide 40 MG tablet  Commonly known as:  LASIX  Take 1 tablet (40 mg total) by mouth 2 (two) times daily.     MELATONIN PO  Take 1 tablet by mouth at bedtime.     metoprolol 50 MG tablet  Commonly known as:  LOPRESSOR  Take 0.5 tablets (25 mg total) by mouth 2 (two) times daily.     montelukast 10 MG tablet  Commonly known as:  SINGULAIR  TAKE ONE TABLET AT BEDTIME     morphine CONCENTRATE 10 MG/0.5ML Soln concentrated solution  Take 0.25 mLs  (5 mg total) by mouth every 3 (three) hours as needed for anxiety or shortness of breath.     nitroGLYCERIN 0.4 MG SL tablet  Commonly known as:  NITROSTAT  Place 1 tablet (0.4 mg total) under the tongue every 5 (five) minutes as needed for chest pain.     predniSONE 20 MG tablet  Commonly known as:  DELTASONE  TAKE (2) TABLETS BY MOUTH ONCE DAILY.     traMADol 50 MG tablet  Commonly known as:  ULTRAM  TAKE ONE TABLET BY MOUTH UP TO 2 TIMES A DAY.        Major procedures and Radiology Reports - PLEASE review detailed and final reports for all details, in brief -       Dg Chest 1 View  01/25/2015  CLINICAL DATA:  COPD. Left pleural effusion. Status post thoracentesis. Chronic lymphocytic leukemia. EXAM: CHEST 1 VIEW COMPARISON:  01/24/2015 FINDINGS: Previously seen left pleural effusion has decreased in size. No pneumothorax visualized. Small right pleural effusion is increased in size since previous study. Increased infiltrate or atelectasis seen in right lung base. Heart size is stable. Bilateral upper lobe scarring again noted. IMPRESSION: Decreased left pleural effusion.  No pneumothorax visualized. Increased small right pleural effusion and  right basilar atelectasis or infiltrate. Electronically Signed   By: Earle Gell M.D.   On: 01/25/2015 15:42   Dg Chest 2 View  01/24/2015  CLINICAL DATA:  Left-sided chest pain and shortness of breath. EXAM: CHEST  2 VIEW COMPARISON:  11/25/2014 and 09/13/2014 FINDINGS: There is a new moderate left pleural effusion. Chronic small right pleural effusion. Heart size is normal. Slight pulmonary vascular congestion. Scarring in the right midzone and laterally at the right base. Prominent right pericardial fat pad. Extensive calcification in the thoracic aorta. Calcified granuloma in the left lower lobe posteriorly. Extensive coronary artery calcification. IMPRESSION: 1. New moderate left pleural effusion. 2. Chronic small right pleural effusion  scarring in the right lung. 3. New slight pulmonary vascular congestion. 4. Aortic atherosclerosis. Electronically Signed   By: Lorriane Shire M.D.   On: 01/24/2015 09:37   Ct Chest Wo Contrast  01/26/2015  CLINICAL DATA:  Status post left thoracentesis yesterday, improved reading but with some left-sided chest soreness EXAM: CT CHEST WITHOUT CONTRAST TECHNIQUE: Multidetector CT imaging of the chest was performed following the standard protocol without IV contrast. COMPARISON:  09/08/2014 CT scan, 01/25/2015 radiograph FINDINGS: No pneumothorax identified. Patchy ground-glass attenuation throughout the right lung with more focal and extensive right lower lobe consolidation. There is a small right pleural effusion which appears possibly partially loculated. There is a moderate left pleural effusion. In the medial left upper lobe there is a band of opacification with right arteries scarring or subsegmental atelectasis. There is patchy diffuse ground-glass attenuation on the left. There is compressive atelectasis related pleural effusion in the left lung base. A 1 cm parenchymal calcification in the left lower lobe appears stable. At due to new there is cardiac enlargement with small pericardial effusion. There is extensive calcification of coronary arteries, aorta, and branch vessels. Limited evaluation of the mediastinum and bilateral hila without contrast. Known enlarged right hilar lymph nodes stable. Left upper quadrant cystic lesion stable from 09/01/2014 and 09/29/2008 consistent with known renal cyst. New no evidence of chest wall hematoma or other focal acute musculoskeletal findings. IMPRESSION: Moderate residual left pleural effusion with no evidence of pneumothorax. No evidence of acute chest wall abnormality. Patchy bilateral ground-glass attenuation with more focal consolidation right lung base possibly representing pneumonia/pneumonitis. Other nonacute findings described above. Electronically Signed    By: Skipper Cliche M.D.   On: 01/26/2015 09:11   Ir Thoracentesis Asp Pleural Space W/img Guide  01/25/2015  INDICATION: Symptomatic left sided pleural effusion - please perform ultrasound-guided thoracentesis for diagnostic and therapeutic purposes. EXAM: IR THORACENTESIS ASP PLEURAL SPACE W/IMG GUIDE COMPARISON:  Chest CT- 09/08/2014 ; ultrasound-guided right-sided thoracentesis - 08/26/2014 MEDICATIONS: None COMPLICATIONS: None immediate TECHNIQUE: Informed written consent was obtained from the patient after a discussion of the risks, benefits and alternatives to treatment. A timeout was performed prior to the initiation of the procedure. Initial ultrasound scanning demonstrates a moderate to large sized left-sided pleural effusion. The lower chest was prepped and draped in the usual sterile fashion. 1% lidocaine was used for local anesthesia. An ultrasound image was saved for documentation purposes. An 8 Fr Safe-T-Centesis catheter was introduced. The thoracentesis was performed. The catheter was removed and a dressing was applied. The patient tolerated the procedure well without immediate post procedural complication. The patient was escorted to have an upright chest radiograph. FINDINGS: A total of approximately 900 cc of serous fluid was removed. Requested samples were sent to the laboratory. IMPRESSION: Successful ultrasound-guided left sided thoracentesis yielding 900  cc of pleural fluid. Electronically Signed   By: Sandi Mariscal M.D.   On: 01/25/2015 16:57    Micro Results      Recent Results (from the past 240 hour(s))  Culture, body fluid-bottle     Status: None (Preliminary result)   Collection Time: 01/25/15  2:29 PM  Result Value Ref Range Status   Specimen Description PLEURAL FLUID LEFT  Final   Special Requests BOTTLES DRAWN AEROBIC AND ANAEROBIC 10CC  Final   Culture PENDING  Incomplete   Report Status PENDING  Incomplete  Gram stain     Status: None   Collection Time: 01/25/15   2:29 PM  Result Value Ref Range Status   Specimen Description PLEURAL FLUID LEFT  Final   Special Requests NONE  Final   Gram Stain   Final    CYTOSPIN SMEAR WBC PRESENT,BOTH PMN AND MONONUCLEAR NO ORGANISMS SEEN    Report Status 01/25/2015 FINAL  Final    Today   Subjective    Johnathan Strong today has no headache,no chest abdominal pain,no new weakness tingling or numbness, feels much better wants to go home today with home hospice.   Objective   Blood pressure 111/61, pulse 82, temperature 98.4 F (36.9 C), temperature source Oral, resp. rate 16, height 5\' 8"  (1.727 m), weight 66.8 kg (147 lb 4.3 oz), SpO2 98 %.   Intake/Output Summary (Last 24 hours) at 01/26/15 0946 Last data filed at 01/26/15 0736  Gross per 24 hour  Intake 458.42 ml  Output    450 ml  Net   8.42 ml    Exam Awake Alert, Oriented x 3, No new F.N deficits, Normal affect [place].AT,PERRAL Supple Neck,No JVD, No cervical lymphadenopathy appriciated.  Symmetrical Chest wall movement, Good air movement bilaterally, few bibasilar rales RRR,No Gallops,Rubs or new Murmurs, No Parasternal Heave +ve B.Sounds, Abd Soft, Non tender, No organomegaly appriciated, No rebound -guarding or rigidity. No Cyanosis, Clubbing or edema, No new Rash or bruise   Data Review   CBC w Diff: Lab Results  Component Value Date   WBC 9.3 01/26/2015   WBC 17.5* 11/25/2014   WBC 9.5 04/03/2014   HGB 8.3* 01/26/2015   HGB 12.0* 04/03/2014   HCT 26.7* 01/26/2015   HCT 34.1* 11/25/2014   HCT 40.0* 04/03/2014   PLT 167 01/26/2015   LYMPHOPCT 24 01/24/2015   MONOPCT 8 01/24/2015   EOSPCT 0 01/24/2015   BASOPCT 0 01/24/2015    CMP: Lab Results  Component Value Date   NA 137 01/26/2015   NA 140 12/08/2014   K 4.0 01/26/2015   CL 100* 01/26/2015   CO2 33* 01/26/2015   BUN 18 01/26/2015   BUN 28* 12/08/2014   CREATININE 1.08 01/26/2015   PROT 5.5* 01/26/2015   PROT 6.0 11/25/2014   ALBUMIN 3.0* 01/26/2015   ALBUMIN 4.4  11/25/2014   BILITOT 1.1 01/26/2015   BILITOT 0.4 11/25/2014   ALKPHOS 49 01/26/2015   AST 33 01/26/2015   ALT 32 01/26/2015  .   Total Time in preparing paper work, data evaluation and todays exam - 35 minutes  Thurnell Lose M.D on 01/26/2015 at 9:46 AM  [facility]   Office  [phone]

## 2015-01-26 NOTE — Evaluation (Signed)
Physical Therapy Evaluation Patient Details Name: Johnathan Strong MRN: [id number] DOB: Apr 26, 1923 Today's Date: 01/26/2015   History of Present Illness  pt presents with Bil Pleural Effusions and L Thoracentesis.  pt with hx of CLL, Bladder CA, CAD, COPD, and TIA.  pt is on home Hospice.    Clinical Impression  Pt moving well and seems mildly deconditioned compared to baseline.  Pt has god support from family, friends, and Aides, but would benefit from [place] if pt wishes.  Will continue to follow if remains on acute.      Follow Up Recommendations Home health PT;Supervision for mobility/OOB    Equipment Recommendations  None recommended by PT    Recommendations for Other Services       Precautions / Restrictions Precautions Precautions: Fall Restrictions Weight Bearing Restrictions: No      Mobility  Bed Mobility Overal bed mobility: Needs Assistance Bed Mobility: Supine to Sit     Supine to sit: Supervision     General bed mobility comments: pt needs increased time to complete without physical A.    Transfers Overall transfer level: Needs assistance Equipment used: Rolling walker (2 wheeled) Transfers: Sit to/from Stand Sit to Stand: Min guard         General transfer comment: Definite use of UEs to complete.  pt mildly unsteady, but able to manage with only guarding and no physical A.    Ambulation/Gait Ambulation/Gait assistance: Min guard Ambulation Distance (Feet): 300 Feet Assistive device: Rolling walker (2 wheeled) Gait Pattern/deviations: Step-through pattern;Decreased stride length;Trunk flexed     General Gait Details: pt indicates fatigue after ambulating, but states it feels good to move.  pt on 3L O2 throughout session.    Stairs            Wheelchair Mobility    Modified Rankin (Stroke Patients Only)       Balance Overall balance assessment: Needs assistance Sitting-balance support: No upper extremity supported;Feet  supported Sitting balance-Leahy Scale: Good     Standing balance support: Single extremity supported;Bilateral upper extremity supported;During functional activity Standing balance-Leahy Scale: Fair                               Pertinent Vitals/Pain Pain Assessment: No/denies pain    Home Living Family/patient expects to be discharged to:: Private residence Living Arrangements: Spouse/significant other Available Help at Discharge: Family;Available 24 hours/day Type of Home: House Home Access: Stairs to enter Entrance Stairs-Rails: Psychiatric nurse of Steps: 4 Home Layout: One level Home Equipment: Walker - 2 wheels;Cane - single point;Shower seat;Grab bars - tub/shower;Wheelchair - manual Additional Comments: pt indicates he and his wife have a "lady" that comes 5 days per week to A him and his wife.  Family check on pt and wife daily.      Prior Function Level of Independence: Needs assistance   Gait / Transfers Assistance Needed: Uses a RW and W/C for distance.  ADL's / Homemaking Assistance Needed: Has an Aide that A with getting a shower and performing homemaking tasks.          Hand Dominance        Extremity/Trunk Assessment   Upper Extremity Assessment: Generalized weakness           Lower Extremity Assessment: Generalized weakness      Cervical / Trunk Assessment: Kyphotic  Communication   Communication: No difficulties  Cognition Arousal/Alertness: Awake/alert Behavior During Therapy: WFL for  tasks assessed/performed Overall Cognitive Status: Within Functional Limits for tasks assessed                      General Comments      Exercises        Assessment/Plan    PT Assessment Patient needs continued PT services  PT Diagnosis Difficulty walking;Generalized weakness   PT Problem List Decreased strength;Decreased activity tolerance;Decreased balance;Decreased mobility;Decreased coordination;Decreased  knowledge of use of DME;Cardiopulmonary status limiting activity  PT Treatment Interventions DME instruction;Gait training;Stair training;Functional mobility training;Therapeutic activities;Therapeutic exercise;Balance training;Patient/family education   PT Goals (Current goals can be found in the Care Plan section) Acute Rehab PT Goals Patient Stated Goal: Home with his wife PT Goal Formulation: With patient Time For Goal Achievement: 02/02/15 Potential to Achieve Goals: Good    Frequency Min 3X/week   Barriers to discharge        Co-evaluation               End of Session Equipment Utilized During Treatment: Gait belt;Oxygen Activity Tolerance: Patient tolerated treatment well Patient left: in chair;with call bell/phone within reach;with family/visitor present Nurse Communication: Mobility status         Time: [id number] PT Time Calculation (min) (ACUTE ONLY): 23 min   Charges:   PT Evaluation $Initial PT Evaluation Tier I: 1 Procedure PT Treatments $Gait Training: 8-22 mins   PT G CodesCatarina Hartshorn, [place] 01/26/2015, 10:32 AM

## 2015-01-26 NOTE — Discharge Instructions (Signed)
Follow with Primary MD Redge Gainer, MD in 7 days   Get CBC, CMP, 2 view Chest X ray checked  by Primary MD next visit.    Activity: As tolerated with Full fall precautions use walker/cane & assistance as needed   Disposition Home with hospice   Diet: Heart Healthy  Check your Weight same time everyday, if you gain over 2 pounds, or you develop in leg swelling, experience more shortness of breath or chest pain, call your Primary MD immediately. Follow Cardiac Low Salt Diet and 1.5 lit/day fluid restriction.   On your next visit with your primary care physician please Get Medicines reviewed and adjusted.   Please request your Prim.MD to go over all Hospital Tests and Procedure/Radiological results at the follow up, please get all Hospital records sent to your Prim MD by signing hospital release before you go home.   If you experience worsening of your admission symptoms, develop shortness of breath, life threatening emergency, suicidal or homicidal thoughts you must seek medical attention immediately by calling 911 or calling your MD immediately  if symptoms less severe.  You Must read complete instructions/literature along with all the possible adverse reactions/side effects for all the Medicines you take and that have been prescribed to you. Take any new Medicines after you have completely understood and accpet all the possible adverse reactions/side effects.   Do not drive, operating heavy machinery, perform activities at heights, swimming or participation in water activities or provide baby sitting services if your were admitted for syncope or siezures until you have seen by Primary MD or a Neurologist and advised to do so again.  Do not drive when taking Pain medications.    Do not take more than prescribed Pain, Sleep and Anxiety Medications  Special Instructions: If you have smoked or chewed Tobacco  in the last 2 yrs please stop smoking, stop any regular Alcohol  and or any  Recreational drug use.  Wear Seat belts while driving.   Please note  You were cared for by a hospitalist during your hospital stay. If you have any questions about your discharge medications or the care you received while you were in the hospital after you are discharged, you can call the unit and asked to speak with the hospitalist on call if the hospitalist that took care of you is not available. Once you are discharged, your primary care physician will handle any further medical issues. Please note that NO REFILLS for any discharge medications will be authorized once you are discharged, as it is imperative that you return to your primary care physician (or establish a relationship with a primary care physician if you do not have one) for your aftercare needs so that they can reassess your need for medications and monitor your lab values.

## 2015-01-27 ENCOUNTER — Telehealth: Payer: Self-pay | Admitting: *Deleted

## 2015-01-27 LAB — PH, BODY FLUID: pH, Body Fluid: 8.4

## 2015-01-27 NOTE — Telephone Encounter (Signed)
Spoke with patient on the phone regarding his hospitalization and discharge.   Date of Discharge:01/26/15  Discharge Facility: [facility]  Principal Discharge Diagnosis: Afib and pleural effusion   Reason for Chronic Case Management: 81 year old male   Call Completed and Appointment Scheduled: Yes, Date: 01/29/15 with Morrie Sheldon, MD   Patient is knowledgeable of his/her condition(s) and/or treatment: Yes  Family and/or caregiver is knowledgeable of patient's condition (s) and/or treatment: yes  Patient is caring for self at home: Yes  Patient is receiving home health services: yes Home Health nurse twice weekly   MEDICATION RECONCILIATION Medication list reviewed with patient: Yes  Patient is able to obtain needed medications: Yes  ACTIVITIES OF DAILY LIVING  Is the patient able to perform his/her own ADLs: Yes  PATIENT EDUCATION  Questions/Concerns Discussed: Concerned that 40 mg of Lasix at bedtime may cause him to have to get up and urinate too frequently during the night. He will discuss this at his appt.

## 2015-01-29 ENCOUNTER — Ambulatory Visit: Payer: Medicare Other | Admitting: Family Medicine

## 2015-01-30 LAB — CULTURE, BODY FLUID-BOTTLE

## 2015-01-30 LAB — CULTURE, BODY FLUID W GRAM STAIN -BOTTLE: Culture: NO GROWTH

## 2015-02-02 ENCOUNTER — Ambulatory Visit (INDEPENDENT_AMBULATORY_CARE_PROVIDER_SITE_OTHER): Payer: Medicare Other

## 2015-02-02 ENCOUNTER — Encounter: Payer: Self-pay | Admitting: Family Medicine

## 2015-02-02 ENCOUNTER — Ambulatory Visit (INDEPENDENT_AMBULATORY_CARE_PROVIDER_SITE_OTHER): Payer: Medicare Other | Admitting: Family Medicine

## 2015-02-02 VITALS — BP 115/64 | HR 79 | Temp 96.9°F | Ht 68.0 in | Wt 150.0 lb

## 2015-02-02 DIAGNOSIS — J9621 Acute and chronic respiratory failure with hypoxia: Secondary | ICD-10-CM | POA: Diagnosis not present

## 2015-02-02 DIAGNOSIS — J90 Pleural effusion, not elsewhere classified: Secondary | ICD-10-CM

## 2015-02-02 DIAGNOSIS — I4891 Unspecified atrial fibrillation: Secondary | ICD-10-CM | POA: Diagnosis not present

## 2015-02-02 DIAGNOSIS — I700 Atherosclerosis of aorta: Secondary | ICD-10-CM

## 2015-02-02 DIAGNOSIS — C9110 Chronic lymphocytic leukemia of B-cell type not having achieved remission: Secondary | ICD-10-CM | POA: Diagnosis not present

## 2015-02-02 DIAGNOSIS — I2510 Atherosclerotic heart disease of native coronary artery without angina pectoris: Secondary | ICD-10-CM

## 2015-02-02 DIAGNOSIS — Z09 Encounter for follow-up examination after completed treatment for conditions other than malignant neoplasm: Secondary | ICD-10-CM

## 2015-02-02 DIAGNOSIS — Z8551 Personal history of malignant neoplasm of bladder: Secondary | ICD-10-CM

## 2015-02-02 MED ORDER — FLUCONAZOLE 150 MG PO TABS: 150.0000 mg | ORAL_TABLET | Freq: Once | ORAL | Status: DC

## 2015-02-02 NOTE — Progress Notes (Signed)
Subjective:    Patient ID: Johnathan Strong, male    DOB: October 30, 1923, 81 y.o.   MRN: [id number]  HPI Patient here today for hospital follow up from [place] where he was admitted for pleural effusion and a fib. He is accompanied today by his wife. The pleural effusion was tapped fall he was in the hospital and the patient was started on digoxin. His pulse ox today is 100% on 3 L. He was in the hospital for a couple of days and discharged on the 22nd of this month. He was diagnosed with acute on chronic respiratory failure with bilateral pleural effusions. After removing fluid the patient's breathing was much better and he was discharged. He was not discharged home on any antibiotics. The patient looks good and is feeling well and he comes to the visit today with his wife and he is on currently 3 L of O2 here in the office with a pulse ox at 100%. Chest x-ray was reviewed before I went into the office and it does show improvement in the bilateral pleural effusions. We will await a radiological interpretation with comparison and let the patient be made aware that soon as we hear from him. The patient also has a history of bladder cancer and this is not been followed very closely over the past year and we will ask him to return a urine specimen for [place] to review here in the office otherwise, he denies any chest pain shortness of breath as much as he had when he went in the hospital trouble swallowing heartburn indigestion nausea vomiting diarrhea or blood in the stool.     Patient Active Problem List   Diagnosis Date Noted  . Chest pain   . Chronic respiratory failure ([place]) 01/24/2015  . Acute respiratory failure ([place])   . Empyema ([place])   . Palliative care by specialist   . Dyspnea   . Respiratory failure ([place])   . Essential hypertension   . SOB (shortness of breath)   . Chest tube in place   . Pleural effusion associated with pulmonary infection   . Blood poisoning ([place])   . Pleural  effusion   . Pleural effusion, right   . Sepsis ([place]) 08/26/2014  . HCAP (healthcare-associated pneumonia) 08/25/2014  . Pleural effusion, left 08/25/2014  . CAP (community acquired pneumonia)   . Pneumonia 08/09/2014  . Paroxysmal atrial fibrillation ([place]) 05/14/2014  . Atrial fibrillation ([place]) 04/26/2014  . Atrial fibrillation with RVR ([place]) 04/26/2014  . Acute respiratory failure with hypoxia ([place]) 04/21/2014  . Bladder cancer ([place]) 03/10/2014  . Testosterone deficiency 06/24/2013  . ASCVD (arteriosclerotic cardiovascular disease) 06/24/2013  . Hyperlipemia 10/21/2012  . Unstable angina ([place]) 09/27/2012  . CAD (coronary artery disease)   . DOE (dyspnea on exertion) 08/02/2012  . Dizziness 08/02/2012  . Chronic lymphatic leukemia ([place]) 07/04/2012  . Hypertension 12/15/2010  . Chronic lymphocytic leukemia 12/15/2010  . COPD GOLD III 08/30/2010   Outpatient Encounter Prescriptions as of 02/02/2015  Medication Sig  . apixaban (ELIQUIS) 5 MG TABS tablet Take 1 tablet (5 mg total) by mouth 2 (two) times daily.  [name] arformoterol (BROVANA) 15 MCG/2ML NEBU Take 2 mLs (15 mcg total) by nebulization 2 (two) times daily.  . budesonide (PULMICORT) 0.5 MG/2ML nebulizer solution Take 2 mLs (0.5 mg total) by nebulization 2 (two) times daily.  . calcium carbonate (TUMS - DOSED IN MG ELEMENTAL CALCIUM) 500 MG chewable tablet Chew 2 tablets by mouth as needed for  indigestion or heartburn.  . clopidogrel (PLAVIX) 75 MG tablet Take 1 tablet (75 mg total) by mouth daily. After decreasing dose of eliquis on 7/15  . DETROL LA 2 MG 24 hr capsule TAKE (1) CAPSULE DAILY  . digoxin (LANOXIN) 0.125 MG tablet Take 1 tablet (0.125 mg total) by mouth daily.  [name] diltiazem (CARDIZEM CD) 120 MG 24 hr capsule Take one daily by mouth BID as directed  . furosemide (LASIX) 40 MG tablet Take 1 tablet (40 mg total) by mouth 2 (two) times daily.  [name] MELATONIN PO Take 1 tablet by mouth at bedtime.  . metoprolol (LOPRESSOR)  50 MG tablet Take 0.5 tablets (25 mg total) by mouth 2 (two) times daily. (Patient taking differently: Take 25 mg by mouth daily. )  . montelukast (SINGULAIR) 10 MG tablet TAKE ONE TABLET AT BEDTIME  . Morphine Sulfate (MORPHINE CONCENTRATE) 10 MG/0.5ML SOLN concentrated solution Take 0.25 mLs (5 mg total) by mouth every 3 (three) hours as needed for anxiety or shortness of breath.  . nitroGLYCERIN (NITROSTAT) 0.4 MG SL tablet Place 1 tablet (0.4 mg total) under the tongue every 5 (five) minutes as needed for chest pain.  . predniSONE (DELTASONE) 20 MG tablet TAKE (2) TABLETS BY MOUTH ONCE DAILY. (Patient taking differently: TAKE 0.5 - 1  TABLET  BY MOUTH ONCE DAILY. Pt alternates tablets every other day. 1 tablet one day, half tablet the next.)  . traMADol (ULTRAM) 50 MG tablet TAKE ONE TABLET BY MOUTH UP TO 2 TIMES A DAY. (Patient taking differently: TAKE HALF  TABLET BY MOUTH UP TO 2 TIMES A DAY AS NEEDED)   No facility-administered encounter medications on file as of 02/02/2015.      Review of Systems  Constitutional: Negative.   HENT: Negative.   Eyes: Negative.   Respiratory: Positive for shortness of breath (wearing 3 liters of O2).   Cardiovascular: Negative.   Gastrointestinal: Negative.   Endocrine: Negative.   Genitourinary: Negative.   Musculoskeletal: Negative.   Skin: Negative.   Allergic/Immunologic: Negative.   Neurological: Negative.   Hematological: Negative.   Psychiatric/Behavioral: Negative.        Objective:   Physical Exam  Constitutional: He is oriented to person, place, and time. He appears well-developed and well-nourished. No distress.  Elderly but alert and doing well considering his multiple health issues  HENT:  Head: Normocephalic and atraumatic.  Right Ear: External ear normal.  Left Ear: External ear normal.  Nose: Nose normal.  Mouth/Throat: Oropharynx is clear and moist. No oropharyngeal exudate.  Eyes: Conjunctivae and EOM are normal. Pupils  are equal, round, and reactive to light. Right eye exhibits no discharge. Left eye exhibits no discharge. No scleral icterus.  Neck: Normal range of motion. Neck supple. No thyromegaly present.  Cardiovascular: Normal rate and normal heart sounds.   No murmur heard. The rhythm is irregular irregular at 84/m  Pulmonary/Chest: Effort normal and breath sounds normal. No respiratory distress. He has no wheezes. He has no rales. He exhibits no tenderness.  No rales wheezes or rhonchi with good breath sounds bilaterally  Abdominal: Soft. Bowel sounds are normal. He exhibits distension. He exhibits no mass. There is no tenderness. There is no rebound and no guarding.  Some gas and distention without masses or tenderness  Musculoskeletal: Normal range of motion. He exhibits edema. He exhibits no tenderness.  The patient comes to the visit in his wheelchair. He does have some slight edema bilaterally.  Lymphadenopathy:    He  has no cervical adenopathy.  Neurological: He is alert and oriented to person, place, and time.  Skin: Skin is warm and dry. No rash noted. No erythema. No pallor.  There is a skin contusion and abrasion on the left leg which was looked at before the dressing was changed. There is no redness or erythema and a pressure dressing was reapplied to this area.  Psychiatric: He has a normal mood and affect. His behavior is normal. Judgment and thought content normal.  Nursing note and vitals reviewed.   BP 115/64 mmHg  Pulse 79  Temp(Src) 96.9 F (36.1 C) (Oral)  Ht '5\' 8"'  (1.727 m)  Wt 150 lb (68.04 kg)  BMI 22.81 kg/m2  SpO2 100% [facility] reading (PRIMARY) by  Dr. Brunilda Payor x-ray-the patient still has bilateral pleural effusions but greatly improved from previous films from the hospital.                                         Assessment & Plan:  1. Pleural effusion -By chest x-ray this appears to have improved. He is certainly breathing better. - BMP8+EGFR - CBC with  Differential/Platelet - DG Chest 2 View; Future - Hepatic function panel  2. Hospital discharge follow-up -He is improved and not having the shortness of breath that he was having when he went into the hospital. - BMP8+EGFR - CBC with Differential/Platelet - DG Chest 2 View; Future - Hepatic function panel - Digoxin level  3. Atrial fibrillation persistent -He remains in atrial fibrillation with a rate of about 84/m - Digoxin level  4. CLL (chronic lymphocytic leukemia) (HCC) -We will check a CBC today to monitor this condition  5. ASCVD (arteriosclerotic cardiovascular disease) -Continue with current cardiovascular medicines including the current Lasix he is taking.  6. Acute on chronic respiratory failure with hypoxia (H[facility]) -This is definitely improved and he is much better  7. Thoracic aortic atherosclerosis ([place]) -He'll continue to watch his diet closely  8. Personal history of bladder cancer -He will return a urine specimen for review for red blood cells  Meds ordered this encounter  Medications  . fluconazole (DIFLUCAN) 150 MG tablet    Sig: Take 1 tablet (150 mg total) by mouth once. Repeat weekly if needed    Dispense:  3 tablet    Refill:  0   Patient Instructions  The patient should continue with his Lasix 40 twice a day He should continue with his breathing treatments 3 and 4 daily. He should continue with respiratory precautions and monitoringpeople that come in the home that may be sick He should return a urine specimen for urinalysis We will call him with a lab work results as soon as those results become available Take Diflucan as directed We will arrange for a more portable oxygen device so that the patient will be able to get out some from the home without the rolling canister that he is currently using The O2 should be continued at 2-3 L/m   Arrie Senate MD

## 2015-02-02 NOTE — Patient Instructions (Signed)
The patient should continue with his Lasix 40 twice a day He should continue with his breathing treatments 3 and 4 daily. He should continue with respiratory precautions and monitoringpeople that come in the home that may be sick He should return a urine specimen for urinalysis We will call him with a lab work results as soon as those results become available Take Diflucan as directed We will arrange for a more portable oxygen device so that the patient will be able to get out some from the home without the rolling canister that he is currently using The O2 should be continued at 2-3 L/m

## 2015-02-03 ENCOUNTER — Other Ambulatory Visit (INDEPENDENT_AMBULATORY_CARE_PROVIDER_SITE_OTHER): Payer: Medicare Other

## 2015-02-03 DIAGNOSIS — R300 Dysuria: Secondary | ICD-10-CM | POA: Diagnosis not present

## 2015-02-03 LAB — HEPATIC FUNCTION PANEL
ALK PHOS: 63 IU/L (ref 39–117)
ALT: 34 IU/L (ref 0–44)
AST: 26 IU/L (ref 0–40)
Albumin: 3.9 g/dL (ref 3.5–4.7)
Bilirubin Total: 0.7 mg/dL (ref 0.0–1.2)
Bilirubin, Direct: 0.26 mg/dL (ref 0.00–0.40)
TOTAL PROTEIN: 5.7 g/dL — AB (ref 6.0–8.5)

## 2015-02-03 LAB — CBC WITH DIFFERENTIAL/PLATELET
BASOS ABS: 0 10*3/uL (ref 0.0–0.2)
BASOS: 0 %
EOS (ABSOLUTE): 0.1 10*3/uL (ref 0.0–0.4)
Eos: 1 %
HEMOGLOBIN: 9.2 g/dL — AB (ref 12.6–17.7)
Hematocrit: 29 % — ABNORMAL LOW (ref 37.5–51.0)
Immature Grans (Abs): 0.3 10*3/uL — ABNORMAL HIGH (ref 0.0–0.1)
Immature Granulocytes: 2 %
LYMPHS ABS: 3.9 10*3/uL — AB (ref 0.7–3.1)
Lymphs: 31 %
MCH: 28.6 pg (ref 26.6–33.0)
MCHC: 31.7 g/dL (ref 31.5–35.7)
MCV: 90 fL (ref 79–97)
MONOCYTES: 6 %
Monocytes Absolute: 0.8 10*3/uL (ref 0.1–0.9)
NEUTROS ABS: 7.4 10*3/uL — AB (ref 1.4–7.0)
Neutrophils: 60 %
Platelets: 357 10*3/uL (ref 150–379)
RBC: 3.22 x10E6/uL — ABNORMAL LOW (ref 4.14–5.80)
RDW: 21.9 % — ABNORMAL HIGH (ref 12.3–15.4)
WBC: 12.5 10*3/uL — ABNORMAL HIGH (ref 3.4–10.8)

## 2015-02-03 LAB — POCT UA - MICROSCOPIC ONLY
CASTS, UR, LPF, POC: NEGATIVE
Crystals, Ur, HPF, POC: NEGATIVE
YEAST UA: NEGATIVE

## 2015-02-03 LAB — BMP8+EGFR
BUN / CREAT RATIO: 22 (ref 10–22)
BUN: 26 mg/dL (ref 8–27)
CHLORIDE: 98 mmol/L (ref 97–106)
CO2: 27 mmol/L (ref 18–29)
Calcium: 9 mg/dL (ref 8.6–10.2)
Creatinine, Ser: 1.17 mg/dL (ref 0.76–1.27)
GFR calc non Af Amer: 55 mL/min/{1.73_m2} — ABNORMAL LOW (ref >59)
GFR, EST AFRICAN AMERICAN: 64 mL/min/{1.73_m2} (ref >59)
Glucose: 98 mg/dL (ref 65–99)
POTASSIUM: 4.7 mmol/L (ref 3.5–5.2)
Sodium: 143 mmol/L (ref 136–144)

## 2015-02-03 LAB — DIGOXIN LEVEL: DIGOXIN LVL: 2 ng/mL (ref 0.9–2.0)

## 2015-02-03 LAB — POCT URINALYSIS DIPSTICK
Bilirubin, UA: NEGATIVE
Glucose, UA: NEGATIVE
Ketones, UA: NEGATIVE
NITRITE UA: NEGATIVE
PH UA: 5
SPEC GRAV UA: 1.01
UROBILINOGEN UA: NEGATIVE

## 2015-02-04 ENCOUNTER — Other Ambulatory Visit: Payer: Self-pay | Admitting: Family Medicine

## 2015-02-05 ENCOUNTER — Other Ambulatory Visit: Payer: Self-pay | Admitting: *Deleted

## 2015-02-05 ENCOUNTER — Telehealth: Payer: Self-pay | Admitting: *Deleted

## 2015-02-05 LAB — URINE CULTURE

## 2015-02-05 MED ORDER — DOXYCYCLINE HYCLATE 100 MG PO TABS: 100.0000 mg | ORAL_TABLET | Freq: Two times a day (BID) | ORAL | Status: DC

## 2015-02-05 NOTE — Telephone Encounter (Signed)
pts Brovana and budesimide are about $900/month. Hospice can save $400/month if we can  switch to [place] and use there generic compounded formulas. Send to Debbi, I will be off till Wednesday 02/10/15. They know it will be Monday when addressed. Debbi, just call [place] apoth to tell them to do generic and use same instructions

## 2015-02-05 NOTE — Telephone Encounter (Signed)
Please switch to [employer] and use their generic preparations to lower the cost for the patient

## 2015-02-05 NOTE — Telephone Encounter (Signed)
Last seen 02/02/15  DWM   If approved print

## 2015-02-05 NOTE — Telephone Encounter (Signed)
Ultram rx ready for pick up  

## 2015-02-05 NOTE — Telephone Encounter (Signed)
lmovm that written Rx is at front desk ready for pickup 

## 2015-02-09 NOTE — Telephone Encounter (Signed)
Spoke with Hospice nurse and gave the OK to switch to [employer]. Nurse states that he will take care of everything. Advised to contact our office with any further needs

## 2015-02-18 ENCOUNTER — Telehealth: Payer: Self-pay | Admitting: Family Medicine

## 2015-02-18 ENCOUNTER — Other Ambulatory Visit: Payer: Self-pay | Admitting: Family Medicine

## 2015-02-18 NOTE — Telephone Encounter (Signed)
Last seen 02/02/15 DWM If approved print

## 2015-02-18 NOTE — Telephone Encounter (Signed)
Just let them know it is okay to refill

## 2015-02-18 NOTE — Telephone Encounter (Signed)
This is okay to refill for this patient 

## 2015-02-24 ENCOUNTER — Ambulatory Visit: Payer: Medicare Other | Admitting: Family Medicine

## 2015-03-03 ENCOUNTER — Other Ambulatory Visit: Payer: Self-pay | Admitting: Family Medicine

## 2015-03-03 NOTE — Telephone Encounter (Signed)
Last seen 02/02/15 DWM  Last filled 02/18/15 #30  If approved print

## 2015-03-04 ENCOUNTER — Ambulatory Visit (INDEPENDENT_AMBULATORY_CARE_PROVIDER_SITE_OTHER): Payer: Medicare Other | Admitting: Family Medicine

## 2015-03-04 ENCOUNTER — Encounter: Payer: Self-pay | Admitting: Family Medicine

## 2015-03-04 ENCOUNTER — Ambulatory Visit (INDEPENDENT_AMBULATORY_CARE_PROVIDER_SITE_OTHER): Payer: Medicare Other

## 2015-03-04 VITALS — BP 112/61 | HR 82 | Temp 96.6°F | Ht 68.0 in | Wt 146.0 lb

## 2015-03-04 DIAGNOSIS — I700 Atherosclerosis of aorta: Secondary | ICD-10-CM | POA: Diagnosis not present

## 2015-03-04 DIAGNOSIS — I2510 Atherosclerotic heart disease of native coronary artery without angina pectoris: Secondary | ICD-10-CM

## 2015-03-04 DIAGNOSIS — I4891 Unspecified atrial fibrillation: Secondary | ICD-10-CM | POA: Diagnosis not present

## 2015-03-04 DIAGNOSIS — J45909 Unspecified asthma, uncomplicated: Secondary | ICD-10-CM | POA: Diagnosis not present

## 2015-03-04 DIAGNOSIS — R8290 Unspecified abnormal findings in urine: Secondary | ICD-10-CM

## 2015-03-04 DIAGNOSIS — I119 Hypertensive heart disease without heart failure: Secondary | ICD-10-CM

## 2015-03-04 DIAGNOSIS — R911 Solitary pulmonary nodule: Secondary | ICD-10-CM | POA: Diagnosis not present

## 2015-03-04 DIAGNOSIS — R918 Other nonspecific abnormal finding of lung field: Secondary | ICD-10-CM | POA: Diagnosis not present

## 2015-03-04 DIAGNOSIS — J449 Chronic obstructive pulmonary disease, unspecified: Secondary | ICD-10-CM

## 2015-03-04 DIAGNOSIS — C9110 Chronic lymphocytic leukemia of B-cell type not having achieved remission: Secondary | ICD-10-CM | POA: Diagnosis not present

## 2015-03-04 DIAGNOSIS — Z09 Encounter for follow-up examination after completed treatment for conditions other than malignant neoplasm: Secondary | ICD-10-CM | POA: Diagnosis not present

## 2015-03-04 LAB — POCT URINALYSIS DIPSTICK
Bilirubin, UA: NEGATIVE
Glucose, UA: NEGATIVE
Ketones, UA: NEGATIVE
NITRITE UA: POSITIVE
PROTEIN UA: NEGATIVE
SPEC GRAV UA: 1.015
UROBILINOGEN UA: NEGATIVE
pH, UA: 6

## 2015-03-04 LAB — POCT UA - MICROSCOPIC ONLY
Casts, Ur, LPF, POC: NEGATIVE
Crystals, Ur, HPF, POC: NEGATIVE
Epithelial cells, urine per micros: NEGATIVE
Mucus, UA: NEGATIVE
YEAST UA: NEGATIVE

## 2015-03-04 NOTE — Patient Instructions (Addendum)
The patient should continue with his O2 and current medicines as doing We will arrange for a one-time visit with his cardiologist on a nonurgent basis and the next few weeks He should rest when he gets a chance and stay as active as possible We will call with the blood work results and the chest x-ray results as soon as it becomes available

## 2015-03-04 NOTE — Progress Notes (Signed)
Subjective:    Patient ID: Johnathan Strong, male    DOB: 12-Feb-1924, 81 y.o.   MRN: [id number]  HPI Patient here today for a 4 week follow up. Today we will recheck a urine and repeat the CXR. The patient has been doing well overall he is on O2 2.5 L/m continuously. We will do a repeat chest x-ray to follow-up one that was done several weeks ago because of atelectasis and COPD at the recommendation of the pulmonologist at that time. He'll also get a urine specimen today. We will just do some blood work as a baseline. The patient is alert but indicates he has trouble with his short-term memory. He denies any chest pain or any more shortness of breath than usual. He is swallowing his food without problems and having no significant problems with burning or voiding. His bowel movements are good.      Patient Active Problem List   Diagnosis Date Noted  . Chest pain   . Chronic respiratory failure ([place]) 01/24/2015  . Acute respiratory failure ([place])   . Empyema ([place])   . Palliative care by specialist   . Dyspnea   . Respiratory failure ([place])   . Essential hypertension   . SOB (shortness of breath)   . Chest tube in place   . Pleural effusion associated with pulmonary infection   . Blood poisoning ([place])   . Pleural effusion   . Pleural effusion, right   . Sepsis ([place]) 08/26/2014  . HCAP (healthcare-associated pneumonia) 08/25/2014  . Pleural effusion, left 08/25/2014  . CAP (community acquired pneumonia)   . Pneumonia 08/09/2014  . Paroxysmal atrial fibrillation ([place]) 05/14/2014  . Atrial fibrillation ([place]) 04/26/2014  . Atrial fibrillation with RVR ([place]) 04/26/2014  . Acute respiratory failure with hypoxia ([place]) 04/21/2014  . Bladder cancer ([place]) 03/10/2014  . Testosterone deficiency 06/24/2013  . ASCVD (arteriosclerotic cardiovascular disease) 06/24/2013  . Hyperlipemia 10/21/2012  . Unstable angina ([place]) 09/27/2012  . CAD (coronary artery disease)   . DOE (dyspnea on exertion)  08/02/2012  . Dizziness 08/02/2012  . Chronic lymphatic leukemia ([place]) 07/04/2012  . Hypertension 12/15/2010  . Chronic lymphocytic leukemia 12/15/2010  . COPD GOLD III 08/30/2010   Outpatient Encounter Prescriptions as of 03/04/2015  Medication Sig  . apixaban (ELIQUIS) 5 MG TABS tablet Take 1 tablet (5 mg total) by mouth 2 (two) times daily.  [name] arformoterol (BROVANA) 15 MCG/2ML NEBU Take 2 mLs (15 mcg total) by nebulization 2 (two) times daily.  . budesonide (PULMICORT) 0.5 MG/2ML nebulizer solution Take 2 mLs (0.5 mg total) by nebulization 2 (two) times daily.  . calcium carbonate (TUMS - DOSED IN MG ELEMENTAL CALCIUM) 500 MG chewable tablet Chew 2 tablets by mouth as needed for indigestion or heartburn.  . clopidogrel (PLAVIX) 75 MG tablet Take 1 tablet (75 mg total) by mouth daily. After decreasing dose of eliquis on 7/15  . DETROL LA 2 MG 24 hr capsule TAKE (1) CAPSULE DAILY  . digoxin (LANOXIN) 0.125 MG tablet Take 1 tablet (0.125 mg total) by mouth daily.  [name] diltiazem (CARDIZEM CD) 120 MG 24 hr capsule Take one daily by mouth BID as directed  . fluconazole (DIFLUCAN) 150 MG tablet Take 1 tablet (150 mg total) by mouth once. Repeat weekly if needed  . furosemide (LASIX) 40 MG tablet Take 1 tablet (40 mg total) by mouth 2 (two) times daily.  [name] MELATONIN PO Take 1 tablet by mouth at bedtime.  . metoprolol (LOPRESSOR) 50  MG tablet Take 0.5 tablets (25 mg total) by mouth 2 (two) times daily. (Patient taking differently: Take 25 mg by mouth daily. )  . montelukast (SINGULAIR) 10 MG tablet TAKE ONE TABLET AT BEDTIME  . Morphine Sulfate (MORPHINE CONCENTRATE) 10 MG/0.5ML SOLN concentrated solution Take 0.25 mLs (5 mg total) by mouth every 3 (three) hours as needed for anxiety or shortness of breath.  . nitroGLYCERIN (NITROSTAT) 0.4 MG SL tablet Place 1 tablet (0.4 mg total) under the tongue every 5 (five) minutes as needed for chest pain.  . predniSONE (DELTASONE) 20 MG tablet TAKE (2)  TABLETS BY MOUTH ONCE DAILY. (Patient taking differently: TAKE 0.5 - 1  TABLET  BY MOUTH ONCE DAILY. Pt alternates tablets every other day. 1 tablet one day, half tablet the next.)  . traMADol (ULTRAM) 50 MG tablet TAKE ONE TABLET BY MOUTH UP TO 2 TIMES A DAY.  . [DISCONTINUED] doxycycline (VIBRA-TABS) 100 MG tablet Take 1 tablet (100 mg total) by mouth 2 (two) times daily.   No facility-administered encounter medications on file as of 03/04/2015.      Review of Systems  Constitutional: Negative.   HENT: Negative.   Eyes: Negative.   Respiratory: Positive for shortness of breath.   Cardiovascular: Negative.   Gastrointestinal: Negative.   Endocrine: Negative.   Genitourinary: Negative.   Musculoskeletal: Negative.   Skin: Negative.   Allergic/Immunologic: Negative.   Neurological: Negative.   Hematological: Negative.   Psychiatric/Behavioral: Negative.        Objective:   Physical Exam  Constitutional: He is oriented to person, place, and time. No distress.  Elderly and pale but alert and responding appropriately to questions asked of him  HENT:  Head: Normocephalic and atraumatic.  Right Ear: External ear normal.  Left Ear: External ear normal.  Mouth/Throat: Oropharynx is clear and moist. No oropharyngeal exudate.  Nasal septal irritated  Eyes: Conjunctivae and EOM are normal. Pupils are equal, round, and reactive to light. Right eye exhibits no discharge. Left eye exhibits no discharge. No scleral icterus.  Neck: Normal range of motion. Neck supple. No thyromegaly present.  Cardiovascular: Normal rate and normal heart sounds.  Exam reveals no gallop and no friction rub.   No murmur heard. Irregular irregular rhythm at about 72/m  Pulmonary/Chest: Effort normal and breath sounds normal. No respiratory distress. He has no wheezes. He has no rales. He exhibits no tenderness.  Diminished breath sounds bilaterally without wheezes rhonchi or rales  Abdominal: Soft. Bowel  sounds are normal. He exhibits no mass. There is no tenderness. There is no rebound and no guarding.  A lot of gas and distention but no masses or organ enlargement  Musculoskeletal: Normal range of motion. He exhibits edema. He exhibits no tenderness.  There is some pedal edema and slight pretibial edema above his sock line  Lymphadenopathy:    He has no cervical adenopathy.  Neurological: He is alert and oriented to person, place, and time.  Skin: Skin is warm and dry. No rash noted.  Psychiatric: He has a normal mood and affect. His behavior is normal. Judgment and thought content normal.  Nursing note and vitals reviewed.  BP 112/61 mmHg  Pulse 82  Temp(Src) 96.6 F (35.9 C) (Oral)  Ht '5\' 8"'  (1.727 m)  Wt 146 lb (66.225 kg)  BMI 22.20 kg/m2  SpO2 100%   [facility] reading (PRIMARY) by  Dr. Brunilda Payor x-ray--  Assessment & Plan:  1. Urine finding -The patient is having no symptoms with this but he has had an abnormal urinalysis in the past. - POCT urinalysis dipstick - POCT UA - Microscopic Only - Urine culture - CBC with Differential/Platelet - Hepatic function panel - Digoxin level  2. Hospital discharge follow-up -He appears to be stable over the past few months since his last hospital discharge - BMP8+EGFR  3. Atrial fibrillation persistent -He remains in atrial fibrillation at about 72/m  4. CLL (chronic lymphocytic leukemia) ([place]) -Patient's color is pale and he is not been back to see his doctor at [facility] he has followed him for years  5. ASCVD (arteriosclerotic cardiovascular disease) -We will arrange a visit with his cardiologist on a nonurgent basis  6. Thoracic aortic atherosclerosis ([place]) -He'll continue to watch his diet closely  7. Hypertension -The blood pressure is good today at 112/61 and we will not make any changes in his medication  8. COPD with asthma ([place]) -His end-stage COPD appears to be stable at this time  and he will continue on his O2 at 2-1/2 L/m  Patient Instructions  The patient should continue with his O2 and current medicines as doing We will arrange for a one-time visit with his cardiologist on a nonurgent basis and the next few weeks He should rest when he gets a chance and stay as active as possible We will call with the blood work results and the chest x-ray results as soon as it becomes available   Arrie Senate MD

## 2015-03-05 ENCOUNTER — Ambulatory Visit: Payer: Medicare Other | Admitting: Family Medicine

## 2015-03-06 LAB — CBC WITH DIFFERENTIAL/PLATELET
BASOS ABS: 0 10*3/uL (ref 0.0–0.2)
Basos: 0 %
EOS (ABSOLUTE): 0 10*3/uL (ref 0.0–0.4)
EOS: 0 %
HEMOGLOBIN: 9.4 g/dL — AB (ref 12.6–17.7)
Hematocrit: 29.6 % — ABNORMAL LOW (ref 37.5–51.0)
IMMATURE GRANULOCYTES: 2 %
Immature Grans (Abs): 0.3 10*3/uL — ABNORMAL HIGH (ref 0.0–0.1)
LYMPHS ABS: 4.9 10*3/uL — AB (ref 0.7–3.1)
LYMPHS: 33 %
MCH: 29.2 pg (ref 26.6–33.0)
MCHC: 31.8 g/dL (ref 31.5–35.7)
MCV: 92 fL (ref 79–97)
MONOCYTES: 3 %
Monocytes Absolute: 0.5 10*3/uL (ref 0.1–0.9)
NEUTROS PCT: 62 %
Neutrophils Absolute: 9 10*3/uL — ABNORMAL HIGH (ref 1.4–7.0)
PLATELETS: 284 10*3/uL (ref 150–379)
RBC: 3.22 x10E6/uL — AB (ref 4.14–5.80)
RDW: 22.7 % — ABNORMAL HIGH (ref 12.3–15.4)
WBC: 14.7 10*3/uL — AB (ref 3.4–10.8)

## 2015-03-06 LAB — BMP8+EGFR
BUN / CREAT RATIO: 22 (ref 10–22)
BUN: 25 mg/dL (ref 8–27)
CO2: 31 mmol/L — ABNORMAL HIGH (ref 18–29)
Calcium: 9.4 mg/dL (ref 8.6–10.2)
Chloride: 94 mmol/L — ABNORMAL LOW (ref 96–106)
Creatinine, Ser: 1.14 mg/dL (ref 0.76–1.27)
GFR, EST AFRICAN AMERICAN: 66 mL/min/{1.73_m2} (ref >59)
GFR, EST NON AFRICAN AMERICAN: 57 mL/min/{1.73_m2} — AB (ref >59)
Glucose: 123 mg/dL — ABNORMAL HIGH (ref 65–99)
POTASSIUM: 5.1 mmol/L (ref 3.5–5.2)
SODIUM: 142 mmol/L (ref 134–144)

## 2015-03-06 LAB — URINE CULTURE

## 2015-03-06 LAB — HEPATIC FUNCTION PANEL
ALBUMIN: 4.4 g/dL (ref 3.5–4.7)
ALK PHOS: 63 IU/L (ref 39–117)
ALT: 40 IU/L (ref 0–44)
AST: 29 IU/L (ref 0–40)
BILIRUBIN, DIRECT: 0.25 mg/dL (ref 0.00–0.40)
Bilirubin Total: 0.6 mg/dL (ref 0.0–1.2)
TOTAL PROTEIN: 6.3 g/dL (ref 6.0–8.5)

## 2015-03-06 LAB — DIGOXIN LEVEL: DIGOXIN LVL: 1.6 ng/mL (ref 0.9–2.0)

## 2015-03-17 ENCOUNTER — Telehealth: Payer: Self-pay | Admitting: Family Medicine

## 2015-03-17 NOTE — Telephone Encounter (Signed)
Busy

## 2015-03-18 NOTE — Telephone Encounter (Signed)
Informed pt of lab results and that these were also mailed to him. Instructed him he did have a UTI and Dr. Laurance Flatten wants to start him on Doxycycline. Pt states he has these on hand no new Rx is needed. He is aware to take 1 tablet twice a day for 2 weeks then return for urine recheck when completed.

## 2015-03-23 ENCOUNTER — Other Ambulatory Visit: Payer: Self-pay | Admitting: Family Medicine

## 2015-03-23 NOTE — Telephone Encounter (Signed)
Tramadol was last filled 03/03/15. He is Hospice. Print and fax to [place]

## 2015-04-08 ENCOUNTER — Other Ambulatory Visit: Payer: Self-pay | Admitting: *Deleted

## 2015-04-08 ENCOUNTER — Other Ambulatory Visit: Payer: Self-pay | Admitting: Family Medicine

## 2015-04-08 ENCOUNTER — Other Ambulatory Visit: Payer: Medicare Other

## 2015-04-08 DIAGNOSIS — R300 Dysuria: Secondary | ICD-10-CM

## 2015-04-08 MED ORDER — CLOPIDOGREL BISULFATE 75 MG PO TABS: 75.0000 mg | ORAL_TABLET | Freq: Every day | ORAL | Status: DC

## 2015-04-08 NOTE — Telephone Encounter (Signed)
Last seen 03/04/15  [facility]  If approved print

## 2015-04-09 MED ORDER — MONTELUKAST SODIUM 10 MG PO TABS: 10.0000 mg | ORAL_TABLET | Freq: Every day | ORAL | Status: AC

## 2015-04-09 NOTE — Telephone Encounter (Signed)
done

## 2015-04-10 LAB — URINE CULTURE

## 2015-04-12 ENCOUNTER — Other Ambulatory Visit: Payer: Self-pay | Admitting: *Deleted

## 2015-04-12 MED ORDER — CLOPIDOGREL BISULFATE 75 MG PO TABS: 75.0000 mg | ORAL_TABLET | Freq: Every day | ORAL | Status: AC

## 2015-04-12 MED ORDER — CIPROFLOXACIN HCL 500 MG PO TABS: 500.0000 mg | ORAL_TABLET | Freq: Two times a day (BID) | ORAL | Status: DC

## 2015-04-20 ENCOUNTER — Other Ambulatory Visit: Payer: Self-pay | Admitting: *Deleted

## 2015-04-20 MED ORDER — DETROL LA 2 MG PO CP24: ORAL_CAPSULE | ORAL | Status: DC

## 2015-04-21 ENCOUNTER — Other Ambulatory Visit: Payer: Medicare Other

## 2015-04-21 DIAGNOSIS — R300 Dysuria: Secondary | ICD-10-CM

## 2015-04-22 LAB — URINE CULTURE: Organism ID, Bacteria: NO GROWTH

## 2015-04-26 ENCOUNTER — Telehealth: Payer: Self-pay | Admitting: Family Medicine

## 2015-04-26 ENCOUNTER — Encounter: Payer: Self-pay | Admitting: Family Medicine

## 2015-04-26 ENCOUNTER — Ambulatory Visit (INDEPENDENT_AMBULATORY_CARE_PROVIDER_SITE_OTHER): Payer: Medicare Other | Admitting: Family Medicine

## 2015-04-26 VITALS — BP 102/57 | HR 72 | Temp 97.2°F | Ht 68.0 in | Wt 135.0 lb

## 2015-04-26 DIAGNOSIS — R42 Dizziness and giddiness: Secondary | ICD-10-CM | POA: Diagnosis not present

## 2015-04-26 MED ORDER — FUROSEMIDE 40 MG PO TABS: ORAL_TABLET | ORAL | Status: DC

## 2015-04-26 NOTE — Telephone Encounter (Signed)
Pt wife aware

## 2015-04-26 NOTE — Patient Instructions (Signed)
Great to meet you!  It looks like his blood pressure will get better if reduce the lasix a little bit.   Lets change from twice daily to once every other day  Take 2 days off of the fluid pill to start with  Please call with any question

## 2015-04-26 NOTE — Telephone Encounter (Signed)
Recommend no more lasix until Thursday morning.  Then take every other day.   Laroy Apple, MD [facility] 04/26/2015, 4:05 PM

## 2015-04-26 NOTE — Progress Notes (Signed)
   HPI  Patient presents today here for dizziness  He is on hospice for chronic lymphocytic leukemia  He has an unsteadiness type feeling when he stands, he states this has been going on for about 8-10 days. He had a fall where he landed on his R buttock at that time. He has not hit his head and has no headache. He is on eliquis for Afib He is bearing weight without pain, he is till walking normally He has a hematoma on his R buttock and has some pain helped by tylenol when he is sitting He has no new weakness  His bp was as low as 65/41 over the weekend and then went up to [id number] systolic.   No chest pain, cough, or dyspnea  He take lasix twice daily with g questionable diuresis for edema,  He takes dilt, metop, and dig for rate control  PMH: Smoking status noted ROS: Per HPI  Objective: BP 102/57 mmHg  Pulse 72  Temp(Src) 97.2 F (36.2 C) (Oral)  Ht 5\' 8"  (1.727 m)  Wt 135 lb (61.236 kg)  BMI 20.53 kg/m2 Gen: NAD, alert, cooperative with exam HEENT: NCAT, Arcus sinelis, EOMI, PERRLA CV: RRR, good S1/S2, no murmur Resp: CTABL, no wheezes, non-labored Ext: No edema, warm Neuro: Alert and oriented, conversates easily, remembers lots of details about meds  Assessment and plan:  # Dizziness Symptoms c/w orthostatis hypotension Soft BP Decrease diuresis, last EF 123456, grade 1 diastolic dysfunction Hold lasix X 2 days, change to every other day No signs of stroke, given red flags to seek medical help Has cardiology follow up in 1 week    Laroy Apple, MD [facility] 04/26/2015, 1:27 PM

## 2015-04-26 NOTE — Telephone Encounter (Signed)
Stp's daughter and reviewed urine cx results, she states pt has been having episodes of dizziness since Saturday and his BP Saturday first thing in the morning was 65/41. After getting to the table and eating breakfast it returned to normal at 122/79 but pt has continued to c/o being dizzy. Advised pt should be seen for further evaluation but [facility] not in office. They will CB once he wakes up to make an appt.

## 2015-04-27 ENCOUNTER — Other Ambulatory Visit: Payer: Self-pay | Admitting: Family Medicine

## 2015-04-28 ENCOUNTER — Ambulatory Visit (INDEPENDENT_AMBULATORY_CARE_PROVIDER_SITE_OTHER): Payer: Medicare Other | Admitting: Cardiology

## 2015-04-28 ENCOUNTER — Encounter: Payer: Self-pay | Admitting: Cardiology

## 2015-04-28 VITALS — BP 140/58 | HR 74 | Ht 68.0 in | Wt 135.0 lb

## 2015-04-28 DIAGNOSIS — I259 Chronic ischemic heart disease, unspecified: Secondary | ICD-10-CM

## 2015-04-28 DIAGNOSIS — I480 Paroxysmal atrial fibrillation: Secondary | ICD-10-CM

## 2015-04-28 NOTE — Progress Notes (Signed)
Cardiology Office Note   Date:  04/28/2015   ID:  Johnathan Strong, DOB Jul 09, 1923, MRN [id number]  PCP:  Redge Gainer, MD  Cardiologist: Johnathan Coco MD  No chief complaint on file.     History of Present Illness: Johnathan Strong is a 82 y.o. male who presents for scheduled follow-up Johnathan Strong is seen today for followup office visit. He presented in April 2014 with symptoms of dyspnea on exertion. Subsequent Myoview study showed significant inferior wall ischemia. He underwent cardiac catheterization on 08/27/2012. This demonstrated ostial occlusion of the right coronary with excellent left to right collateral flow. He had a high-grade proximal LAD stenosis. LV function was well-preserved.. Patient does have a history of COPD Gold stage III, CLL, TIA, and hypertension. He is aspirin allergic. He subsequently underwent successful DES stenting of his LAD by Dr. Peter Strong on 09/26/12. He will be on lifelong Plavix. He is allergic to aspirin. 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 He had a P2Y12 assay prior to his stent placement which showed an excellent response of Plavix at 35 PR U   He also has a history of bladder cancer and is followed by Dr.Ottelin and a history of CLL for which he is followed by Dr. Broadus John 0. Strong at [facility]. The patient was hospitalized at [facility] from February 16 through 04/27/2014. He was admitted with exacerbation of his COPD. While in the hospital he was documented to have several episodes of atrial fibrillation. For this reason he was discharged on Eliquis. He remains on Plavix because of his drug-eluting stent. Since discharge she has not been aware of any recurrence of his atrial fibrillation. However, he was not aware of it in the hospital when he was in atrial ablation. He has had some mild exertional dyspnea and chest pressure relieved by rest. He is  able to go up and down his stairs at home without difficulty. On 04/22/14 while in the hospital he had an echocardiogram showing an ejection fraction of 60-65% with grade 1 diastolic dysfunction while in normal sinus rhythm. There was mild mitral regurgitation. There was mild left atrial enlargement. The patient has significant COPD.  He is now on home oxygen. He has been having problems with orthostatic hypotension and dizziness when he stands up.  His PCP recently reduced his furosemide because of these symptoms.  I concur with that.   Past Medical History  Diagnosis Date  . Osteopenia 2006  . Hyperlipidemia   . BPH (benign prostatic hypertrophy)   . Transient global amnesia   . Osteoarthritis   . History of TIA (transient ischemic attack) 1980'S    NO RESIDUAL  . COPD (chronic obstructive pulmonary disease) with emphysema ([place])   . Chronic back pain   . Itching SECONDARY TO CLL-- CONTROLLED W/ SINGULAIR  . Frequency   . Nocturia   . Hypertension CARDIOLOGIST- DR Johnathan Strong-- LAST VISIT 12-15-2010 NOTE IN EPIC  . CLL (chronic lymphocytic leukemia) ([facility]) LAST PLT COUNT APRL 2012  168    Managed at [facility]  . Bladder cancer ([place])   . Aspirin allergy     Eyes swell  . Asthma   . Dyspnea on exertion   . CAD (coronary artery disease)     a. 08/2012 Cath: LM nl, LAD 90p, LCX 20-30, RCA 100ost fills via L->R collats, EF 55-65%;  b. 09/2012 PCI of  prox LAD with 3.0x16 Promus DES.    Past Surgical History  Procedure Laterality Date  . Excisional hemorrhoidectomy  1980  . Cardiovascular stress test  07/2005    a. 07/2005- no reversible ischemia, normal EF b. 06/2009- EF 73%, no reversible ischemia, inferolateral TWIs at rest, upright in recovery c. 08/2012- mediuim-sized partially reversible basal to mid inferior and inferoseptal perfusion defect c/w with prior infarction and peri-infarct ischemia; EF 68% and borderline ST/T changes on stress ECG   . Transurethral  resection of bladder tumor  10-19-2008    AND DILATION URETHRAL STRICTURE  . Cataract extraction w/ intraocular lens  implant, bilateral Bilateral ~ 2007  . Transthoracic echocardiogram  01-03-2011    LVEF 123456, grade 1 diastolic dysfunction, no WMAs or structural abnormalities  . Cystoscopy with biopsy  10/30/2011    Procedure: CYSTOSCOPY WITH BIOPSY;  Surgeon: Johnathan Jabs, MD;  Location: [facility];  Service: Urology;  Laterality: N/A;  . Cardiac catheterization  08/27/2012  . Coronary angioplasty with stent placement  09/26/2012    "1" (09/26/2012)  . Coronary stent placement  09/26/12  . Left heart catheterization with coronary angiogram N/A 08/27/2012    Procedure: LEFT HEART CATHETERIZATION WITH CORONARY ANGIOGRAM;  Surgeon: Johnathan M Martinique, MD;  Location[facility] C[facility] LAB;  Service: Cardiovascular;  Laterality: N/A;  . Percutaneous coronary stent intervention (pci-s) N/A 09/26/2012    Procedure: PERCUTANEOUS CORONARY STENT INTERVENTION (PCI-S);  Surgeon: Johnathan M Martinique, MD;  Location: [facility] CATH LAB;  Service: Cardiovascular;  Laterality: N/A;     Current Outpatient Prescriptions  Medication Sig Dispense Refill  . apixaban (ELIQUIS) 5 MG TABS tablet Take 1 tablet (5 mg total) by mouth 2 (two) times daily. 180 tablet 3  . arformoterol (BROVANA) 15 MCG/2ML NEBU Take 2 mLs (15 mcg total) by nebulization 2 (two) times daily. 60 mL 2  . budesonide (PULMICORT) 0.5 MG/2ML nebulizer solution Take 2 mLs (0.5 mg total) by nebulization 2 (two) times daily. 2 mL 0  . calcium carbonate (TUMS - DOSED IN MG ELEMENTAL CALCIUM) 500 MG chewable tablet Chew 2 tablets by mouth as needed for indigestion or heartburn.    . clopidogrel (PLAVIX) 75 MG tablet Take 1 tablet (75 mg total) by mouth daily. 90 tablet 0  . DIGOX 125 MCG tablet TAKE ONE TABLET BY MOUTH DAILY. 30 tablet 2  . diltiazem (CARDIZEM CD) 120 MG 24 hr capsule Take 120 mg by mouth 2 (two) times daily.    . furosemide (LASIX) 40 MG  tablet Take 40 mg by mouth every other day.    . metoprolol (LOPRESSOR) 50 MG tablet Take 25 mg by mouth daily.    . montelukast (SINGULAIR) 10 MG tablet Take 1 tablet (10 mg total) by mouth at bedtime. 90 tablet 3  . Morphine Sulfate (MORPHINE CONCENTRATE) 10 MG/0.5ML SOLN concentrated solution Take 0.25 mLs (5 mg total) by mouth every 3 (three) hours as needed for anxiety or shortness of breath. 42 mL 0  . nitroGLYCERIN (NITROSTAT) 0.4 MG SL tablet Place 1 tablet (0.4 mg total) under the tongue every 5 (five) minutes as needed for chest pain. 25 tablet 3  . predniSONE (DELTASONE) 20 MG tablet Take by mouth daily with breakfast. Take 20 mg by mouth once daily on one day, then take 10 mg (1/2 tablet) by mouth once daily the next day, making sure to alternate days.    [name] tolterodine (DETROL LA) 2 MG 24 hr capsule Take 2 mg by  mouth daily.    . traMADol (ULTRAM) 50 MG tablet TAKE ONE TABLET BY MOUTH UP TO 2 TIMES A DAY. 30 tablet 0   No current facility-administered medications for this visit.    Allergies:   Aspirin; Nsaids; Septra; Albuterol; Benicar; Salicylates; and Ampicillin    Social History:  The patient  reports that he quit smoking about 55 years ago. His smoking use included Cigarettes. He has a 25 pack-year smoking history. He quit smokeless tobacco use about 36 years ago. He reports that he drinks alcohol. He reports that he does not use illicit drugs.   Family History:  The patient's family history includes Atrial fibrillation in his father; COPD in his father; Emphysema in his father; Heart disease in his father; Kidney cancer in his mother; Kidney failure in his mother.    ROS:  Please see the history of present illness.   Otherwise, review of systems are positive for none.   All other systems are reviewed and negative.    PHYSICAL EXAM: VS:  BP 140/58 mmHg  Pulse 74  Ht 5\' 8"  (1.727 m)  Wt 135 lb (61.236 kg)  BMI 20.53 kg/m2 , BMI Body mass index is 20.53 kg/(m^2). GEN:  Well nourished, well developed, in no acute distress HEENT: normal Neck: no JVD, carotid bruits, or masses Cardiac: Irregularly irregular,; no murmurs, rubs, or gallops,no edema  Respiratory:  clear to auscultation bilaterally, normal work of breathing GI: soft, nontender, nondistended, + BS MS: no deformity or atrophy Skin: warm and dry, no rash Neuro:  Strength and sensation are intact Psych: euthymic mood, full affect   EKG:  EKG is ordered today. The ekg ordered today demonstrates atrial fibrillation with controlled ventricular response of 74 bpm.  Nonspecific inferolateral ST-T wave changes.  Since previous tracing of 01/24/15, rate is significantly slower.   Recent Labs: 08/25/2014: B Natriuretic Peptide 224.5* 09/03/2014: Magnesium 2.0 01/26/2015: Hemoglobin 8.3* 03/04/2015: ALT 40; BUN 25; Creatinine, Ser 1.14; Platelets 284; Potassium 5.1; Sodium 142    Lipid Panel    Component Value Date/Time   CHOL 179 11/25/2014 1707   CHOL 109 04/03/2014 1023   TRIG 87 11/25/2014 1707   TRIG 61 04/03/2014 1023   HDL 73 11/25/2014 1707   HDL 44 04/03/2014 1023   CHOLHDL 2.5 11/25/2014 1707   LDLCALC 89 11/25/2014 1707   LDLCALC 59 06/19/2013 0943      Wt Readings from Last 3 Encounters:  04/28/15 135 lb (61.236 kg)  04/26/15 135 lb (61.236 kg)  03/04/15 146 lb (66.225 kg)        ASSESSMENT AND PLAN:  1. Ischemic heart disease status post successful drug-eluting stent to high grade proximal LAD stenosis. The patient has a known ostial occlusion of the right coronary artery with excellent left to right collateral flow. He is allergic to aspirin. He will be on lifelong Plavix. 2. Hypercholesterolemia.  Not currently on statin therapy 3. History of bladder cancer followed by Dr. Karsten Ro 4. History of CLL followed by [facility] Sabas Sous at [facility] 5. Essential hypertension without heart failure 6. Paroxysmal atrial fibrillation.  Today he is in atrial fibrillation by EKG  although the patient was not aware that he was in atrial fibrillation.  This is probably because the rate is slow and relatively regular.. HisChadssvasc score is 6 for hypertension, age above 61, history of TIA, and vascular disease with prior coronary artery stent.   Current medicines are reviewed at length with the patient today.  The patient  does not have concerns regarding medicines.  The following changes have been made:  no change  Labs/ tests ordered today include:   Orders Placed This Encounter  Procedures  . EKG 12-Lead     Disposition:  The patient will remain on current medication.  For his cardiology follow-up he will see Dr. Martinique in 4 months at the [facility] office.  Dr. Martinique also sees the patient's wife.  Berna Spare MD 04/28/2015 5:16 PM    [facility] [place], [place], [place]  [address] Phone: [phone]; Fax: [phone]

## 2015-04-28 NOTE — Patient Instructions (Signed)
Medication Instructions:  Your physician recommends that you continue on your current medications as directed. Please refer to the Current Medication list given to you today.  Labwork: NONE  Testing/Procedures: NONE  Follow-Up: Your physician recommends that you schedule a follow-up appointment in: 4 MONTH OV WITH DR Martinique AT THE [facility] OFFICE   If you need a refill on your cardiac medications before your next appointment, please call your pharmacy.

## 2015-04-29 ENCOUNTER — Other Ambulatory Visit: Payer: Self-pay | Admitting: *Deleted

## 2015-04-29 ENCOUNTER — Other Ambulatory Visit (INDEPENDENT_AMBULATORY_CARE_PROVIDER_SITE_OTHER): Payer: Medicare Other

## 2015-04-29 DIAGNOSIS — R399 Unspecified symptoms and signs involving the genitourinary system: Secondary | ICD-10-CM

## 2015-04-29 DIAGNOSIS — R300 Dysuria: Secondary | ICD-10-CM | POA: Diagnosis not present

## 2015-04-29 LAB — POCT URINALYSIS DIPSTICK
Bilirubin, UA: NEGATIVE
Blood, UA: NEGATIVE
Glucose, UA: NEGATIVE
Ketones, UA: NEGATIVE
LEUKOCYTES UA: NEGATIVE
Nitrite, UA: NEGATIVE
PROTEIN UA: NEGATIVE
Spec Grav, UA: 1.01
UROBILINOGEN UA: NEGATIVE
pH, UA: 5

## 2015-04-29 LAB — POCT UA - MICROSCOPIC ONLY
CASTS, UR, LPF, POC: NEGATIVE
CRYSTALS, UR, HPF, POC: NEGATIVE
RBC, URINE, MICROSCOPIC: NEGATIVE
WBC, Ur, HPF, POC: NEGATIVE
YEAST UA: NEGATIVE

## 2015-05-11 ENCOUNTER — Other Ambulatory Visit: Payer: Self-pay | Admitting: Family Medicine

## 2015-05-12 NOTE — Telephone Encounter (Signed)
Patient last seen in office on 04-26-15. Rx last filled on 04-27-15. Please advise. If approved rx will print. Please route to Pool A so nurse can call patient to pick up

## 2015-05-20 ENCOUNTER — Other Ambulatory Visit: Payer: Self-pay | Admitting: Family Medicine

## 2015-05-20 ENCOUNTER — Telehealth: Payer: Self-pay

## 2015-05-20 NOTE — Telephone Encounter (Signed)
Patient getting daily weights  From 05/01/15 weighed 132.9  05/20/15 148.8  Lasix decreased last month  Please call Cassandra at Hospice with instructions  Dr Laurance Flatten PCP

## 2015-05-21 ENCOUNTER — Telehealth: Payer: Self-pay | Admitting: Family Medicine

## 2015-05-21 ENCOUNTER — Telehealth: Payer: Self-pay

## 2015-05-21 ENCOUNTER — Ambulatory Visit: Payer: Medicare Other | Admitting: Family Medicine

## 2015-05-21 ENCOUNTER — Telehealth: Payer: Self-pay | Admitting: Cardiology

## 2015-05-21 NOTE — Telephone Encounter (Signed)
Called no answer, phone rang x6

## 2015-05-21 NOTE — Telephone Encounter (Signed)
New message      Pt is a former pt of Dr Mare Ferrari.  Wife want to talk to the nurse.  She would not tell me what she wanted.

## 2015-05-21 NOTE — Telephone Encounter (Signed)
Lasix daily for the next 3 -4 days is probably ok as long as his BP is not less than [id number] systolic.   I would recommend probably being seen for evaluation but I understand this may be difficult.   Try to get him in early next week for labs, BP check, and to look at his volume status.   Laroy Apple, MD [facility] 05/21/2015, 12:48 PM

## 2015-05-21 NOTE — Telephone Encounter (Signed)
Patients daughter aware and verbalizes understanding. Patients BP today was 148/64.

## 2015-05-21 NOTE — Telephone Encounter (Signed)
Rick from Hospice is with patient.  He reports that he has noticed edema in the ankles for the last two days and he is now experiencing shortness of breath.  The patient is taking Lasix 40 mg every other day.  Liliane Channel would like to know what you recommend for him.

## 2015-05-24 ENCOUNTER — Ambulatory Visit (INDEPENDENT_AMBULATORY_CARE_PROVIDER_SITE_OTHER)

## 2015-05-24 ENCOUNTER — Ambulatory Visit (INDEPENDENT_AMBULATORY_CARE_PROVIDER_SITE_OTHER): Admitting: Family Medicine

## 2015-05-24 ENCOUNTER — Encounter: Payer: Self-pay | Admitting: Family Medicine

## 2015-05-24 VITALS — BP 124/62 | HR 82 | Temp 97.2°F | Ht 68.0 in | Wt 147.0 lb

## 2015-05-24 DIAGNOSIS — R0602 Shortness of breath: Secondary | ICD-10-CM

## 2015-05-24 DIAGNOSIS — J189 Pneumonia, unspecified organism: Secondary | ICD-10-CM

## 2015-05-24 MED ORDER — TORSEMIDE 20 MG PO TABS: 20.0000 mg | ORAL_TABLET | Freq: Every day | ORAL | Status: AC

## 2015-05-24 MED ORDER — LEVOFLOXACIN 500 MG PO TABS: 500.0000 mg | ORAL_TABLET | Freq: Every day | ORAL | Status: DC

## 2015-05-24 NOTE — Progress Notes (Signed)
   HPI  Patient presents today . Shortness of breath.  Patient and his family are with him explaining that he's had difficulty breathing for about one week. This is also associated with slightly worsening memory and confusion.  We were called late last week and told that he had gained weight and was having increased shortness of breath. Also explained he had leg edema He is on hospice for COPD His Lasix was increased over the weekend. He does not feel that he's having good diuresis with Lasix  We will decrease Lasix to 1 pill every other day he felt much better until he developed this illness.  Reviewed the fluids normally. He denies cough, fever, chills, or feeling ill.   PMH: Smoking status noted ROS: Per HPI  Objective: BP 124/62 mmHg  Pulse 82  Temp(Src) 97.2 F (36.2 C) (Oral)  Ht 5\' 8"  (1.727 m)  Wt 147 lb (66.679 kg)  BMI 22.36 kg/m2 Gen: NAD, alert, cooperative with exam HEENT: NCAT CV: RRR, good S1/S2, no murmur Resp: Nonlabored, decreased air movement throughout, crackles in the right base Ext: No edema, warm Neuro: Alert and conversational, does seem to be confused about possible medication changes   CXR - RLL Infiltrate  #Dyspnea The factorial, likely some portion of volume overload as well as pneumonia. Chest x-ray today shows right lower lobe infiltrate Levaquin Considering not very good diuresis with Lasix, I changed him over to torsemide which will avoid absorption issues if he has got wall edema Return to clinic in 1-2 weeks to discuss memory and to check his volume status.  His son was with him today and requested that I write a letter that states that his memory and confusion is poor enough that he needs to have his medical power of attorney to make his decisions, however I'll bring him back in 1-2 weeks to address this directly.    Orders Placed This Encounter  Procedures  . DG Chest 2 View    Standing Status: Future     Number of Occurrences:  1     Standing Expiration Date: 07/23/2016    Order Specific Question:  Reason for Exam (SYMPTOM  OR DIAGNOSIS REQUIRED)    Answer:  eval volume and CAP    Order Specific Question:  Preferred imaging location?    Answer:  Internal  . Basic metabolic panel    Order Specific Question:  Has the patient fasted?    Answer:  No  . CBC with Differential/Platelet    Meds ordered this encounter  Medications  . levofloxacin (LEVAQUIN) 500 MG tablet    Sig: Take 1 tablet (500 mg total) by mouth daily.    Dispense:  7 tablet    Refill:  0  . torsemide (DEMADEX) 20 MG tablet    Sig: Take 1 tablet (20 mg total) by mouth daily.    Dispense:  30 tablet    Refill:  [place], MD [facility] 05/24/2015, 2:08 PM

## 2015-05-24 NOTE — Patient Instructions (Signed)
Change to torsemide from lasix, 1 pill once a day  Finish all antibiotics  Community-Acquired Pneumonia, Adult Pneumonia is an infection of the lungs. There are different types of pneumonia. One type can develop while a person is in a hospital. A different type, called community-acquired pneumonia, develops in people who are not, or have not recently been, in the hospital or other health care facility.  CAUSES Pneumonia may be caused by bacteria, viruses, or funguses. Community-acquired pneumonia is often caused by Streptococcus pneumonia bacteria. These bacteria are often passed from one person to another by breathing in droplets from the cough or sneeze of an infected person. RISK FACTORS The condition is more likely to develop in:  People who havechronic diseases, such as chronic obstructive pulmonary disease (COPD), asthma, congestive heart failure, cystic fibrosis, diabetes, or kidney disease.  People who haveearly-stage or late-stage HIV.  People who havesickle cell disease.  People who havehad their spleen removed (splenectomy).  People who havepoor Human resources officer.  People who havemedical conditions that increase the risk of breathing in (aspirating) secretions their own mouth and nose.   People who havea weakened immune system (immunocompromised).  People who smoke.  People whotravel to areas where pneumonia-causing germs commonly exist.  People whoare around animal habitats or animals that have pneumonia-causing germs, including birds, bats, rabbits, cats, and farm animals. SYMPTOMS Symptoms of this condition include:  Adry cough.  A wet (productive) cough.  Fever.  Sweating.  Chest pain, especially when breathing deeply or coughing.  Rapid breathing or difficulty breathing.  Shortness of breath.  Shaking chills.  Fatigue.  Muscle aches. DIAGNOSIS Your health care provider will take a medical history and perform a physical exam. You may  also have other tests, including:  Imaging studies of your chest, including X-rays.  Tests to check your blood oxygen level and other blood gases.  Other tests on blood, mucus (sputum), fluid around your lungs (pleural fluid), and urine. If your pneumonia is severe, other tests may be done to identify the specific cause of your illness. TREATMENT The type of treatment that you receive depends on many factors, such as the cause of your pneumonia, the medicines you take, and other medical conditions that you have. For most adults, treatment and recovery from pneumonia may occur at home. In some cases, treatment must happen in a hospital. Treatment may include:  Antibiotic medicines, if the pneumonia was caused by bacteria.  Antiviral medicines, if the pneumonia was caused by a virus.  Medicines that are given by mouth or through an IV tube.  Oxygen.  Respiratory therapy. Although rare, treating severe pneumonia may include:  Mechanical ventilation. This is done if you are not breathing well on your own and you cannot maintain a safe blood oxygen level.  Thoracentesis. This procedureremoves fluid around one lung or both lungs to help you breathe better. HOME CARE INSTRUCTIONS  Take over-the-counter and prescription medicines only as told by your health care provider.  Only takecough medicine if you are losing sleep. Understand that cough medicine can prevent your body's natural ability to remove mucus from your lungs.  If you were prescribed an antibiotic medicine, take it as told by your health care provider. Do not stop taking the antibiotic even if you start to feel better.  Sleep in a semi-upright position at night. Try sleeping in a reclining chair, or place a few pillows under your head.  Do not use tobacco products, including cigarettes, chewing tobacco, and e-cigarettes. If you  need help quitting, ask your health care provider.  Drink enough water to keep your urine clear  or pale yellow. This will help to thin out mucus secretions in your lungs. PREVENTION There are ways that you can decrease your risk of developing community-acquired pneumonia. Consider getting a pneumococcal vaccine if:  You are older than 80 years of age.  You are older than 80 years of age and are undergoing cancer treatment, have chronic lung disease, or have other medical conditions that affect your immune system. Ask your health care provider if this applies to you. There are different types and schedules of pneumococcal vaccines. Ask your health care provider which vaccination option is best for you. You may also prevent community-acquired pneumonia if you take these actions:  Get an influenza vaccine every year. Ask your health care provider which type of influenza vaccine is best for you.  Go to the dentist on a regular basis.  Wash your hands often. Use hand sanitizer if soap and water are not available. SEEK MEDICAL CARE IF:  You have a fever.  You are losing sleep because you cannot control your cough with cough medicine. SEEK IMMEDIATE MEDICAL CARE IF:  You have worsening shortness of breath.  You have increased chest pain.  Your sickness becomes worse, especially if you are an older adult or have a weakened immune system.  You cough up blood.   This information is not intended to replace advice given to you by your health care provider. Make sure you discuss any questions you have with your health care provider.   Document Released: 02/20/2005 Document Revised: 11/11/2014 Document Reviewed: 06/17/2014 Elsevier Interactive Patient Education [employer].

## 2015-05-25 LAB — BASIC METABOLIC PANEL
BUN/Creatinine Ratio: 29 — ABNORMAL HIGH (ref 10–22)
BUN: 32 mg/dL (ref 10–36)
CALCIUM: 8.8 mg/dL (ref 8.6–10.2)
CO2: 27 mmol/L (ref 18–29)
Chloride: 92 mmol/L — ABNORMAL LOW (ref 96–106)
Creatinine, Ser: 1.11 mg/dL (ref 0.76–1.27)
GFR calc Af Amer: 67 mL/min/{1.73_m2} (ref >59)
GFR, EST NON AFRICAN AMERICAN: 58 mL/min/{1.73_m2} — AB (ref >59)
GLUCOSE: 108 mg/dL — AB (ref 65–99)
POTASSIUM: 4.8 mmol/L (ref 3.5–5.2)
SODIUM: 137 mmol/L (ref 134–144)

## 2015-05-25 LAB — CBC WITH DIFFERENTIAL/PLATELET
BASOS ABS: 0 10*3/uL (ref 0.0–0.2)
Basos: 0 %
EOS (ABSOLUTE): 0 10*3/uL (ref 0.0–0.4)
Eos: 0 %
Hematocrit: 25.3 % — ABNORMAL LOW (ref 37.5–51.0)
Hemoglobin: 7.9 g/dL — CL (ref 12.6–17.7)
IMMATURE GRANS (ABS): 0.2 10*3/uL — AB (ref 0.0–0.1)
IMMATURE GRANULOCYTES: 1 %
LYMPHS: 30 %
Lymphocytes Absolute: 4.4 10*3/uL — ABNORMAL HIGH (ref 0.7–3.1)
MCH: 29.4 pg (ref 26.6–33.0)
MCHC: 31.2 g/dL — AB (ref 31.5–35.7)
MCV: 94 fL (ref 79–97)
MONOS ABS: 0.5 10*3/uL (ref 0.1–0.9)
Monocytes: 4 %
NEUTROS PCT: 65 %
Neutrophils Absolute: 9.4 10*3/uL — ABNORMAL HIGH (ref 1.4–7.0)
PLATELETS: 357 10*3/uL (ref 150–379)
RBC: 2.69 x10E6/uL — AB (ref 4.14–5.80)
RDW: 24.5 % — AB (ref 12.3–15.4)
WBC: 14.6 10*3/uL — AB (ref 3.4–10.8)

## 2015-05-26 ENCOUNTER — Other Ambulatory Visit: Payer: Self-pay | Admitting: Family Medicine

## 2015-06-03 ENCOUNTER — Encounter: Payer: Self-pay | Admitting: Family Medicine

## 2015-06-03 ENCOUNTER — Ambulatory Visit (INDEPENDENT_AMBULATORY_CARE_PROVIDER_SITE_OTHER): Admitting: Family Medicine

## 2015-06-03 ENCOUNTER — Ambulatory Visit: Payer: Medicare Other | Admitting: Family Medicine

## 2015-06-03 VITALS — BP 106/55 | HR 70 | Temp 95.0°F | Ht 68.0 in | Wt 135.4 lb

## 2015-06-03 DIAGNOSIS — S81819A Laceration without foreign body, unspecified lower leg, initial encounter: Secondary | ICD-10-CM | POA: Diagnosis not present

## 2015-06-03 DIAGNOSIS — I2510 Atherosclerotic heart disease of native coronary artery without angina pectoris: Secondary | ICD-10-CM

## 2015-06-03 DIAGNOSIS — R0600 Dyspnea, unspecified: Secondary | ICD-10-CM

## 2015-06-03 DIAGNOSIS — J189 Pneumonia, unspecified organism: Secondary | ICD-10-CM | POA: Diagnosis not present

## 2015-06-03 DIAGNOSIS — I4891 Unspecified atrial fibrillation: Secondary | ICD-10-CM | POA: Diagnosis not present

## 2015-06-03 NOTE — Patient Instructions (Addendum)
Great to see you!  Decrease torsemide to 1 pill every other day  Continue Eliquis at the current dose  Come back in 1 month to see me or Dr. Laurance Flatten

## 2015-06-03 NOTE — Progress Notes (Signed)
   HPI  Patient presents today here for follow-up after pneumonia.  Patient was seen 10 days ago and diagnosed with pneumonia based on chest x-ray, he was treated with Levaquin for 7 days, he feels much better. At the time he also appeared volume overloaded, his weight was 147 pounds, he was changed to daily torsemide instead of Lasix.  Today he reports he feels much better. He feels much more clear headed. His family was concerned about power of attorney, which was established but had to be activated if he was unable to make decisions. Yesterday they met with a lower urine have the language changed so that they can making decisions at anytime, he agrees with this and is glad to have matters settled.  He is on hospice for COPD.  He had some recent skin tears on his legs The most recent was on his right lower extremity and had a difficult time stopping bleeding. Questions about eliquis and wondering if he needs it. The bleeding was controlled after hospice came and applied bandages   PMH: Smoking status noted ROS: Per HPI  Objective: BP 106/55 mmHg  Pulse 70  Temp(Src) 95 F (35 C) (Axillary)  Ht '5\' 8"'$  (1.727 m)  Wt 135 lb 6.4 oz (61.417 kg)  BMI 20.59 kg/m2 Gen: NAD, alert, cooperative with exam HEENT: NCAT CV: regular rate, no murmur Resp: CTABL, no wheezes, non-labored - bases clear, much improved aeration  Ext: No edema, warm Neuro: Alert and oriented, No gross deficits  Skin:  bruising on legs L leg tear 9 mm X 18 mm R leg 10 mm X 20 mm No erythema, induration, or warmth   Assessment and plan:  # dyspnea (multifactorial, CAP, COPD, and Volume overload) Improved Completed levaquin Down 12 lbs since our visit and gets good diuresis with torsemide.  Decrease torsemide to twice daily, this should have more consistent diuresisi as it bypasses gut wall edema  # A fib Continue eliquis, CHADS Vasc is 6 I discussed that considering he is hospice status he can choose  at any time to stop but he is still at risk for stroke with afib - he would like to continue Renal dosing, if weight dips below 60 kg he would go down to 2.5 BID, he is 61 kg today and significantly down since last visit.  Rate controlled No falls or major bleeds to contraindicate  # Ischemic heart disease s/p DES, CAD Continue plavix, discussed continue plavix even if he chooses to stop eliquis  # skin tears No infection, routine skin/wound care     Laroy Apple, MD [facility] 06/03/2015, 12:23 PM

## 2015-06-18 ENCOUNTER — Other Ambulatory Visit: Payer: Self-pay | Admitting: Family Medicine

## 2015-06-24 ENCOUNTER — Ambulatory Visit (INDEPENDENT_AMBULATORY_CARE_PROVIDER_SITE_OTHER): Admitting: Nurse Practitioner

## 2015-06-24 ENCOUNTER — Encounter: Payer: Medicare Other | Admitting: Nurse Practitioner

## 2015-06-24 ENCOUNTER — Telehealth: Payer: Self-pay | Admitting: *Deleted

## 2015-06-24 ENCOUNTER — Ambulatory Visit (INDEPENDENT_AMBULATORY_CARE_PROVIDER_SITE_OTHER)

## 2015-06-24 ENCOUNTER — Encounter: Payer: Self-pay | Admitting: Nurse Practitioner

## 2015-06-24 VITALS — BP 103/56 | HR 94 | Temp 96.8°F

## 2015-06-24 DIAGNOSIS — L989 Disorder of the skin and subcutaneous tissue, unspecified: Secondary | ICD-10-CM

## 2015-06-24 DIAGNOSIS — R059 Cough, unspecified: Secondary | ICD-10-CM

## 2015-06-24 DIAGNOSIS — R05 Cough: Secondary | ICD-10-CM

## 2015-06-24 MED ORDER — AZITHROMYCIN 250 MG PO TABS: ORAL_TABLET | ORAL | Status: AC

## 2015-06-24 NOTE — Progress Notes (Signed)
   Subjective:    Patient ID: Johnathan Strong, male    DOB: [birth year], 82 y.o.   MRN: [id number]  HPI Patient brought in by wife stating that he has a cough and congestion for several days- wanted to make sure he does not have pneumonia. He is a hospice patient.  * had a lesion removed from left forearm on Monday April 17 and it has continued to bleed and would like area looked at.    Review of Systems  Constitutional: Negative for fever, chills and appetite change.  HENT: Positive for congestion. Negative for ear pain, rhinorrhea, sinus pressure and sore throat.   Respiratory: Positive for cough. Negative for shortness of breath.   Cardiovascular: Negative.   Gastrointestinal: Negative.   Genitourinary: Negative.   Neurological: Negative.   Psychiatric/Behavioral: Negative.   All other systems reviewed and are negative.      Objective:   Physical Exam  Constitutional: He is oriented to person, place, and time. He appears well-developed and well-nourished.  Cardiovascular: Normal rate and normal heart sounds.   Pulmonary/Chest:  o2 via nasal cannulla  Neurological: He is alert and oriented to person, place, and time. He has normal reflexes.  Skin: Skin is warm.  Psychiatric: He has a normal mood and affect. His behavior is normal. Judgment and thought content normal.    BP 103/56 mmHg  Pulse 94  Temp(Src) 96.8 F (36 C) (Oral)  Wt   Chest x ray- prior infiltrates improving       Assessment & Plan:  1. Cough Continue mucinex as rx - CBC with Differential/Platelet - DG Chest 2 View; Future - azithromycin (ZITHROMAX Z-PAK) 250 MG tablet; As directed  Dispense: 1 each; Refill: 0  2. Arm skin lesion, left dressing change to left forearm RTO prn  Mary-Margaret Hassell Done, FNP

## 2015-06-24 NOTE — Telephone Encounter (Signed)
appt made and pt wife aware 

## 2015-06-24 NOTE — Telephone Encounter (Signed)
He has 2 issues, 1st,  he had an area removed on forearm Monday. The area is only as big as an Web designer. Hospice applied pressure dressing consisting of 2 ABD pads, 2 telfa pads and curlex wrapped between 10 and 15 times. All the bandages are completely saturated and it was on for 16 hrs .He is on 2 blood thinners.  He is complaining of light-headedness. They called dermatologist and was told to use vaseline and cover, not working! 2nd- increased congestion and low grade temp of 99.6

## 2015-06-24 NOTE — Telephone Encounter (Signed)
This patient probably needs to come in in someone needs to listen to his chest and check the wound and get a CBC and BMP

## 2015-07-05 DEATH — deceased

## 2015-07-13 ENCOUNTER — Ambulatory Visit: Payer: Medicare Other | Admitting: Family Medicine

## 2015-08-30 ENCOUNTER — Ambulatory Visit: Payer: Medicare Other | Admitting: Cardiology

## 2016-08-04 IMAGING — CT CT CHEST W/ CM
2 of 3 series · 15 of 36 positions shown, 18 images · IV contrast (APPLIED)
Comparison: None.

CLINICAL DATA: Pleurisy and increase shortness of breath

EXAM:
CT CHEST WITH CONTRAST
TECHNIQUE: Multidetector CT imaging of the chest was performed during
intravenous contrast administration.
CONTRAST:  75mL OMNIPAQUE IOHEXOL 300 MG/ML  SOLN

[Series 2: thorax 5.0 i31f 1 · axial · 0.77mm/px · z∈[-285,+0]mm · 12 of 67 slices shown, 15 images]
[im 5/67  mediastinal]
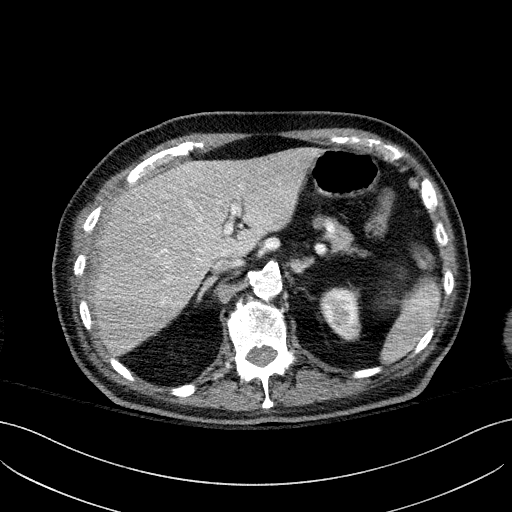
[im 5/67  lung]
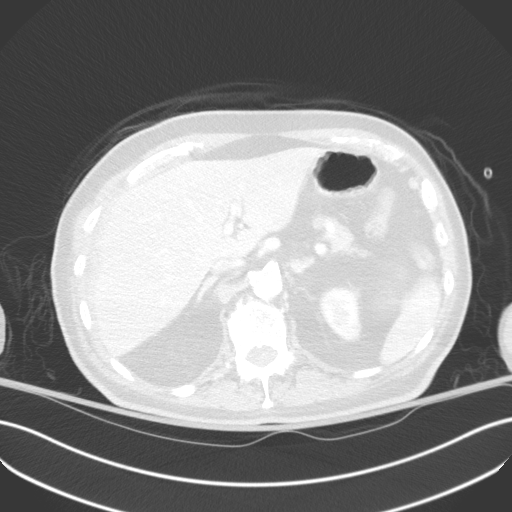
[im 10/67  lung]
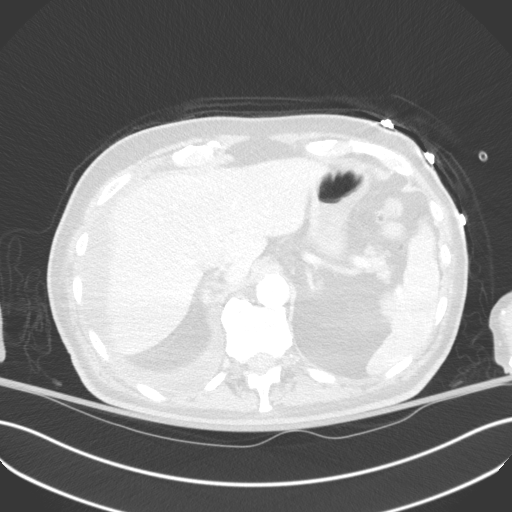
[im 15/67  lung]
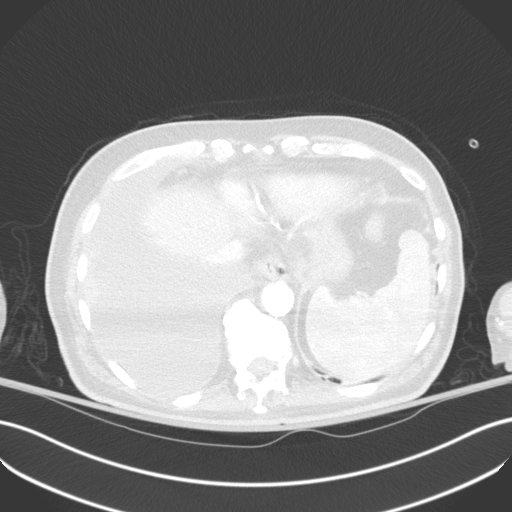
[im 20/67  lung]
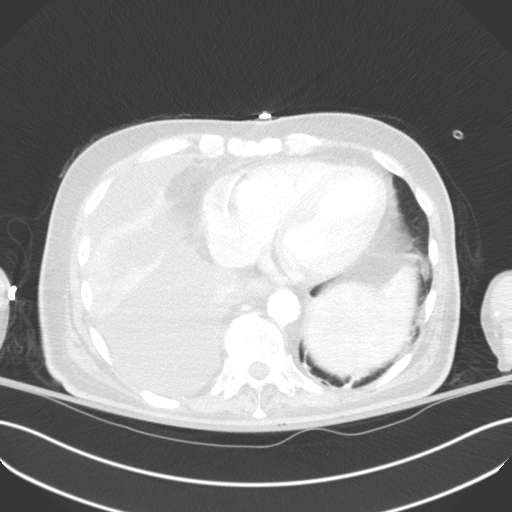
[im 25/67  mediastinal]
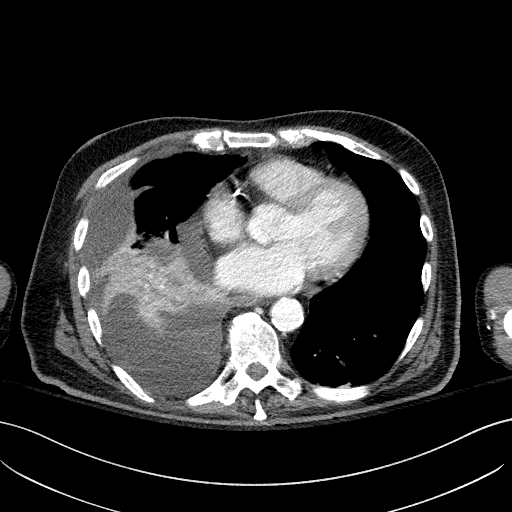
[im 25/67  lung]
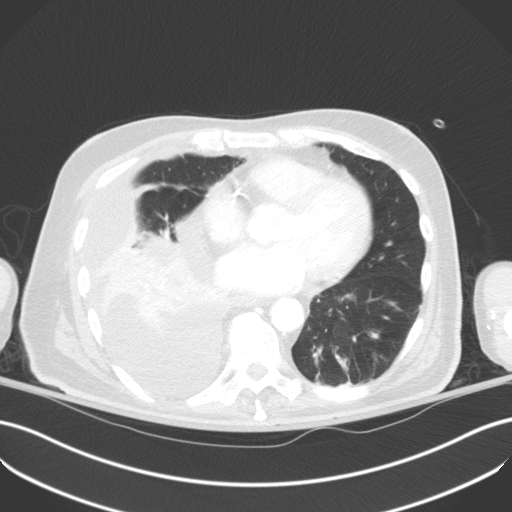
[im 30/67  lung]
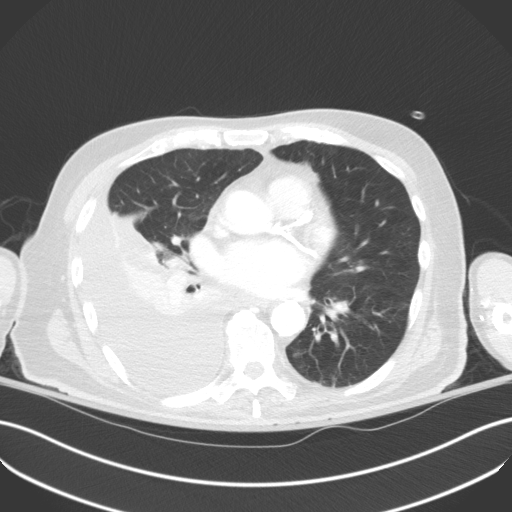
[im 37/67  lung]
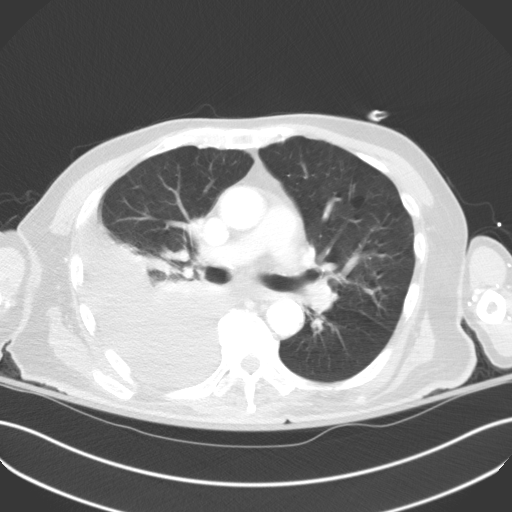
[im 42/67  lung]
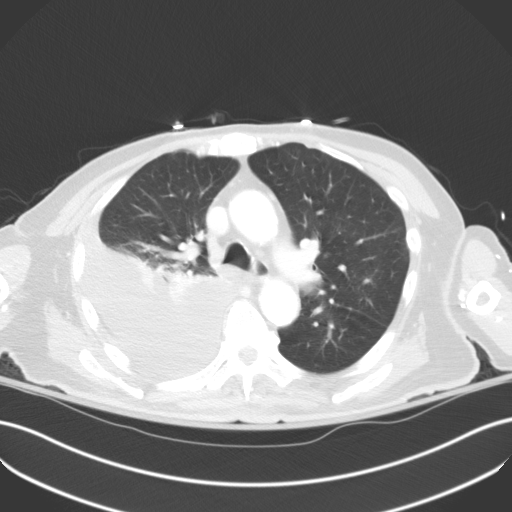
[im 47/67  mediastinal]
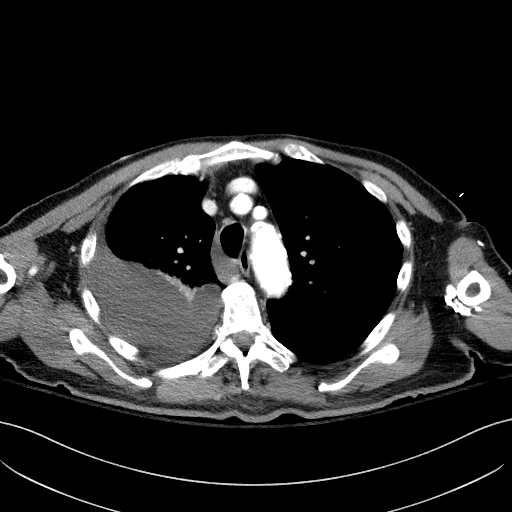
[im 47/67  lung]
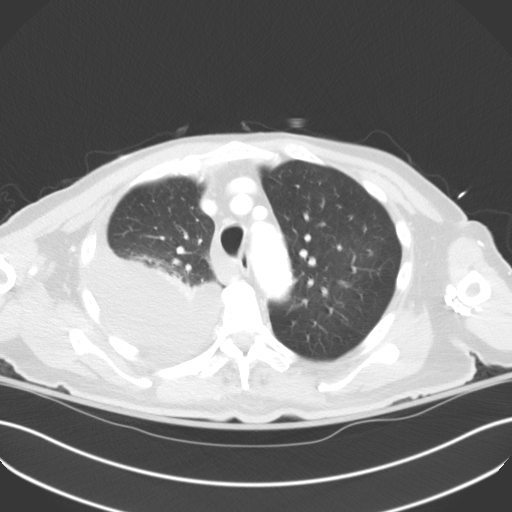
[im 52/67  lung]
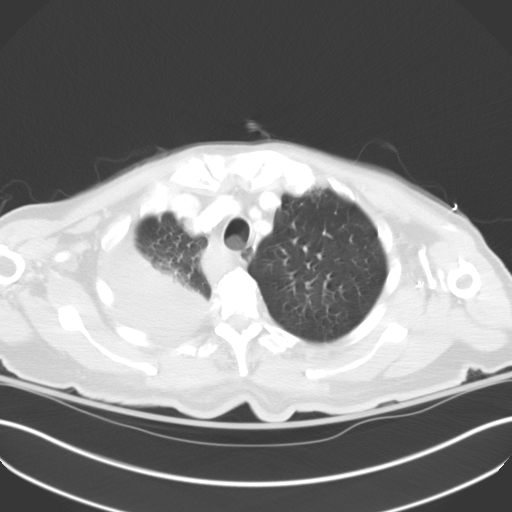
[im 57/67  lung]
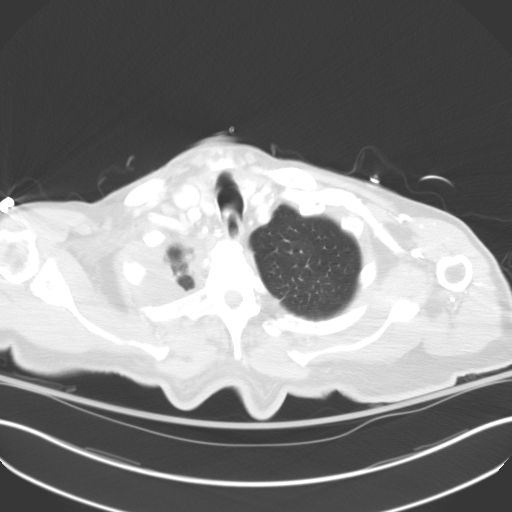
[im 62/67  lung]
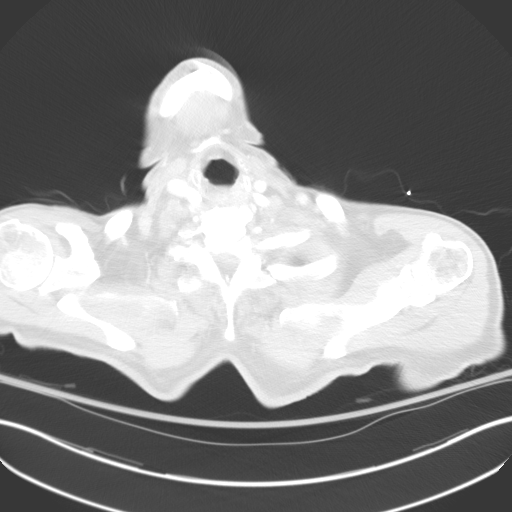

[Series 5: coronal · coronal · 0.65mm/px · 3 of 77 slices shown]
[im 16/77  lung]
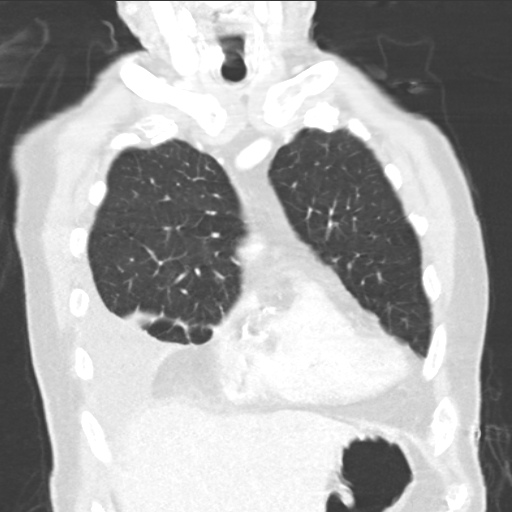
[im 31/77  lung]
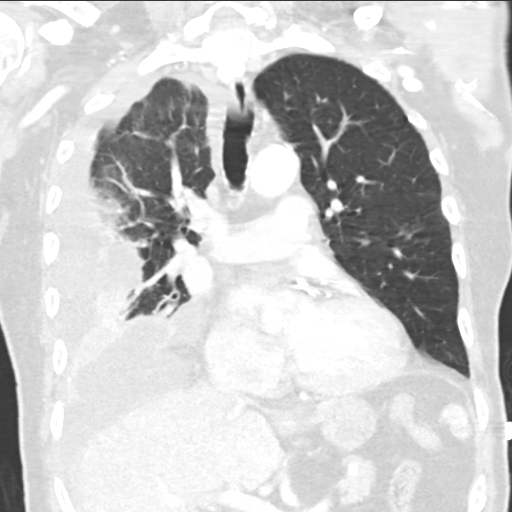
[im 46/77  lung]
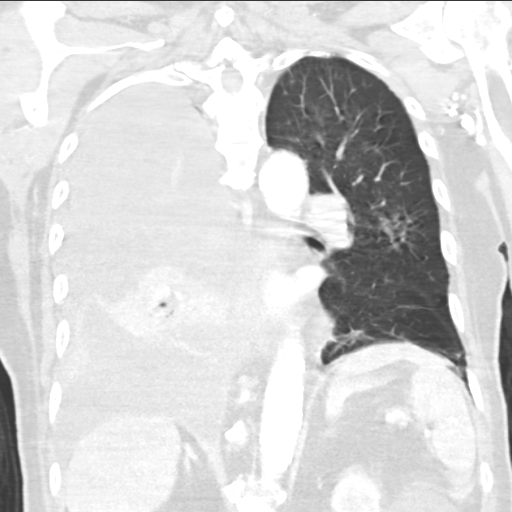

[15 of 36 positions shown; findings below may reference images not displayed]

FINDINGS: THORACIC INLET/BODY WALL:

No acute abnormality.

MEDIASTINUM:

Normal heart size. No pericardial effusion. Extensive coronary
atherosclerosis. No acute vascular abnormality. No adenopathy.

LUNG WINDOWS:

Large right pleural effusion which is water density. The fluid is
predominantly free-flowing based on comparison to previous chest
x-ray, but there is some complex features with scalloping of the
lung margins. The right lower lobe and lateral segment right middle
lobe are collapsed. There is no evidence of pleural nodularity or
lung mass. No pneumonia is suspected.

Calcified granuloma in the left lower lobe.

UPPER ABDOMEN:

Partly visualized left renal cyst, imaged portions stable from
09/29/2008 abdominal CT

OSSEOUS:

No acute fracture.  No suspicious lytic or blastic lesions.
IMPRESSION: Large right pleural effusion with lower lobe and middle lobe
segmental collapse. The effusion is predominantly free-flowing based
on prior chest x-ray, but there may be partial loculation where the
lung is scalloped . No pleural or pulmonary cause is identified.
